# Patient Record
Sex: Female | Born: 1985 | Race: White | Hispanic: No | Marital: Married | Smoking: Former smoker
Health system: Southern US, Community
[De-identification: ages and names within clinical notes are randomized; demographics above are authoritative.]

## PROBLEM LIST (undated history)

## (undated) DIAGNOSIS — I10 Essential (primary) hypertension: Secondary | ICD-10-CM

## (undated) DIAGNOSIS — E559 Vitamin D deficiency, unspecified: Secondary | ICD-10-CM

## (undated) DIAGNOSIS — E66813 Obesity, class 3: Secondary | ICD-10-CM

## (undated) DIAGNOSIS — F319 Bipolar disorder, unspecified: Secondary | ICD-10-CM

## (undated) DIAGNOSIS — F451 Undifferentiated somatoform disorder: Secondary | ICD-10-CM

## (undated) DIAGNOSIS — M62830 Muscle spasm of back: Secondary | ICD-10-CM

## (undated) DIAGNOSIS — E119 Type 2 diabetes mellitus without complications: Secondary | ICD-10-CM

## (undated) DIAGNOSIS — F419 Anxiety disorder, unspecified: Secondary | ICD-10-CM

## (undated) DIAGNOSIS — E7800 Pure hypercholesterolemia, unspecified: Secondary | ICD-10-CM

## (undated) DIAGNOSIS — E781 Pure hyperglyceridemia: Secondary | ICD-10-CM

## (undated) DIAGNOSIS — F6089 Other specific personality disorders: Secondary | ICD-10-CM

## (undated) DIAGNOSIS — F603 Borderline personality disorder: Secondary | ICD-10-CM

## (undated) DIAGNOSIS — R000 Tachycardia, unspecified: Secondary | ICD-10-CM

## (undated) HISTORY — PX: APPENDECTOMY: SHX54

## (undated) HISTORY — PX: HAND SURGERY: SHX662

## (undated) HISTORY — PX: CHOLECYSTECTOMY: SHX55

## (undated) HISTORY — PX: SHOULDER SURGERY: SHX246

---

## 2007-12-02 ENCOUNTER — Encounter: Admission: RE | Admit: 2007-12-02 | Discharge: 2007-12-02 | Payer: Self-pay | Admitting: Unknown Physician Specialty

## 2008-10-16 ENCOUNTER — Emergency Department (HOSPITAL_COMMUNITY): Admission: EM | Admit: 2008-10-16 | Discharge: 2008-10-16 | Payer: Self-pay | Admitting: Family Medicine

## 2009-03-20 ENCOUNTER — Ambulatory Visit: Payer: Self-pay | Admitting: Family Medicine

## 2009-03-20 DIAGNOSIS — S93409A Sprain of unspecified ligament of unspecified ankle, initial encounter: Secondary | ICD-10-CM | POA: Insufficient documentation

## 2009-03-20 DIAGNOSIS — M25579 Pain in unspecified ankle and joints of unspecified foot: Secondary | ICD-10-CM

## 2009-03-20 DIAGNOSIS — J45909 Unspecified asthma, uncomplicated: Secondary | ICD-10-CM | POA: Insufficient documentation

## 2009-11-15 ENCOUNTER — Encounter: Admission: RE | Admit: 2009-11-15 | Discharge: 2009-11-15 | Payer: Self-pay | Admitting: Unknown Physician Specialty

## 2010-09-28 ENCOUNTER — Encounter: Payer: Self-pay | Admitting: Unknown Physician Specialty

## 2015-07-16 DIAGNOSIS — F312 Bipolar disorder, current episode manic severe with psychotic features: Secondary | ICD-10-CM | POA: Insufficient documentation

## 2015-07-17 DIAGNOSIS — F1220 Cannabis dependence, uncomplicated: Secondary | ICD-10-CM | POA: Insufficient documentation

## 2015-08-03 DIAGNOSIS — S62337A Displaced fracture of neck of fifth metacarpal bone, left hand, initial encounter for closed fracture: Secondary | ICD-10-CM | POA: Insufficient documentation

## 2017-01-27 ENCOUNTER — Ambulatory Visit (INDEPENDENT_AMBULATORY_CARE_PROVIDER_SITE_OTHER): Payer: Medicaid Other | Admitting: Licensed Clinical Social Worker

## 2017-01-27 DIAGNOSIS — F4321 Adjustment disorder with depressed mood: Secondary | ICD-10-CM

## 2017-01-27 DIAGNOSIS — F319 Bipolar disorder, unspecified: Secondary | ICD-10-CM | POA: Diagnosis not present

## 2017-01-27 DIAGNOSIS — F4320 Adjustment disorder, unspecified: Secondary | ICD-10-CM | POA: Diagnosis not present

## 2017-01-27 DIAGNOSIS — Z639 Problem related to primary support group, unspecified: Secondary | ICD-10-CM
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
## 2017-02-11 ENCOUNTER — Ambulatory Visit (HOSPITAL_COMMUNITY): Payer: Self-pay | Admitting: Licensed Clinical Social Worker

## 2017-03-02 ENCOUNTER — Ambulatory Visit (INDEPENDENT_AMBULATORY_CARE_PROVIDER_SITE_OTHER): Payer: Medicaid Other | Admitting: Licensed Clinical Social Worker

## 2017-03-02 DIAGNOSIS — F319 Bipolar disorder, unspecified: Secondary | ICD-10-CM

## 2017-03-02 DIAGNOSIS — F4320 Adjustment disorder, unspecified: Secondary | ICD-10-CM

## 2017-03-02 DIAGNOSIS — Z639 Problem related to primary support group, unspecified: Secondary | ICD-10-CM | POA: Diagnosis not present

## 2017-03-02 DIAGNOSIS — F4321 Adjustment disorder with depressed mood: Secondary | ICD-10-CM
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
## 2017-03-22 ENCOUNTER — Ambulatory Visit (HOSPITAL_COMMUNITY): Payer: Self-pay | Admitting: Licensed Clinical Social Worker

## 2017-04-08 ENCOUNTER — Ambulatory Visit (HOSPITAL_COMMUNITY): Payer: Medicaid Other | Admitting: Licensed Clinical Social Worker

## 2017-06-03 ENCOUNTER — Encounter: Payer: Self-pay | Admitting: *Deleted

## 2017-06-03 ENCOUNTER — Emergency Department
Admission: EM | Admit: 2017-06-03 | Discharge: 2017-06-03 | Disposition: A | Discharge status: Home / Self Care | Payer: Medicaid Other | Source: Home / Self Care | Attending: Family Medicine | Admitting: Family Medicine

## 2017-06-03 DIAGNOSIS — S0990XA Unspecified injury of head, initial encounter: Secondary | ICD-10-CM | POA: Diagnosis not present

## 2017-06-03 HISTORY — DX: Essential (primary) hypertension: I10

## 2017-06-03 HISTORY — DX: Bipolar disorder, unspecified: F31.9

## 2017-06-03 MED ORDER — IBUPROFEN 400 MG PO TABS
400.0000 mg | ORAL_TABLET | Freq: Once | ORAL | Status: AC
  Administered 2017-06-03: 400 mg via ORAL

## 2017-06-03 MED ORDER — IBUPROFEN 200 MG PO TABS: 200.0000 mg | ORAL_TABLET | Freq: Once | ORAL | Status: DC

## 2017-06-03 NOTE — ED Triage Notes (Signed)
Patient reports hitting her head on the corner of a doorway while reaching for something last night. She denies loss of consciousness. This AM awoke with confusion, HA on the right side and behind her right eye, and blurry vision. No otc meds taken.

## 2017-06-03 NOTE — ED Provider Notes (Signed)
[facility]    CSN: [id number] Arrival date & time: 06/03/17  1332     History   Chief Complaint Chief Complaint  Patient presents with  . Head Injury  . Headache    HPI Kara Stephens is a 31 y.o. female.   HPI  Kara Stephens is a 31 y.o. female presenting to UC with c/o mild Right front head pain that started last night after bending forward hitting her head on the corner of a door frame.  Denies LOC. She had minimal pain last night but states when she woke this morning she felt confused and had a mild headache behind her Right eye with blurred vision. Denies dizziness, nausea or vomiting.  No pain medication taken PTA.  She lives with her husband and daughters but states they did not comment on her acting strange this morning. Pt states she just felt bad.  She had a mild concussion several years ago after hitting her head on a desk at work.  No lasting effects from that. She is not on any blood thinners.     Past Medical History:  Diagnosis Date  . Bipolar I disorder (HCC)   . Hypertension     Patient Active Problem List   Diagnosis Date Noted  . ASTHMA 03/20/2009  . PAIN IN JOINT, ANKLE AND FOOT 03/20/2009  . SPRAIN/STRAIN, ANKLE NOS 03/20/2009    Past Surgical History:  Procedure Laterality Date  . APPENDECTOMY    . CHOLECYSTECTOMY    . SHOULDER SURGERY      OB History    No data available       Home Medications    Prior to Admission medications   Medication Sig Start Date End Date Taking? Authorizing Provider  clonazePAM (KLONOPIN) 1 MG tablet Take 1 mg by mouth 2 (two) times daily.   Yes [provider]  lithium carbonate (ESKALITH) 450 MG CR tablet Take 450 mg by mouth 2 (two) times daily.   Yes [provider]  traZODone (DESYREL) 150 MG tablet Take by mouth at bedtime.   Yes [provider]    Family History Family History  Problem Relation Age of Onset  . Hypertension Mother   . Hyperlipidemia Mother    . Suicidality Father   . Depression Father     Social History Social History  Substance Use Topics  . Smoking status: Current Every Day Smoker    Types: Cigarettes  . Smokeless tobacco: Never Used  . Alcohol use No     Allergies   Sulfur   Review of Systems Review of Systems  Eyes: Positive for visual disturbance. Negative for photophobia, pain and redness.  Gastrointestinal: Negative for nausea and vomiting.  Musculoskeletal: Negative for neck pain and neck stiffness.  Neurological: Positive for headaches. Negative for dizziness, seizures, facial asymmetry, weakness, light-headedness and numbness.  Psychiatric/Behavioral: Positive for confusion.     Physical Exam Triage Vital Signs ED Triage Vitals  Enc Vitals Group     BP 06/03/17 1422 123/84     Pulse Rate 06/03/17 1422 86     Resp 06/03/17 1422 14     Temp --      Temp src --      SpO2 06/03/17 1422 97 %     Weight 06/03/17 1423 220 lb (99.8 kg)     Height --      Head Circumference --      Peak Flow --      Pain  Score 06/03/17 1423 7     Pain Loc --      Pain Edu? --      Excl. in GC? --    No data found.   Updated Vital Signs BP 123/84 (BP Location: Left Arm)   Pulse 86   Resp 14   Wt 220 lb (99.8 kg)   LMP 05/06/2017   SpO2 97%   Visual Acuity- uncorrected  Right Eye Distance: 20/20 Left Eye Distance: 20/20 Bilateral Distance: 20/15   Physical Exam  Constitutional: She is oriented to person, place, and time. She appears well-developed and well-nourished. No distress.  HENT:  Head: Normocephalic and atraumatic.  Nose: Nose normal.  Mouth/Throat: Oropharynx is clear and moist.  Eyes: Pupils are equal, round, and reactive to light. Conjunctivae and EOM are normal. Right eye exhibits no discharge. Left eye exhibits no discharge.  Neck: Normal range of motion.  Cardiovascular: Normal rate and regular rhythm.   Pulmonary/Chest: Effort normal. No respiratory distress. She has no wheezes. She  has no rales.  Musculoskeletal: Normal range of motion.  Neurological: She is alert and oriented to person, place, and time. No cranial nerve deficit.  CN II-XII in tact. Speech is clear. Alert to person, place, time and event. Normal finger to nose coordination. Normal gait. Normal heel-to-toe gait.   Skin: Skin is warm and dry. She is not diaphoretic.  Psychiatric: She has a normal mood and affect. Her behavior is normal.  Nursing note and vitals reviewed.    UC Treatments / Results  Labs (all labs ordered are listed, but only abnormal results are displayed) Labs Reviewed - No data to display  EKG  EKG Interpretation None       Radiology No results found.  Procedures Procedures (including critical care time)  Medications Ordered in UC Medications  ibuprofen (ADVIL,MOTRIN) tablet 400 mg (400 mg Oral Given 06/03/17 1435)     Initial Impression / Assessment and Plan / UC Course  I have reviewed the triage vital signs and the nursing notes.  Pertinent labs & imaging results that were available during my care of the patient were reviewed by me and considered in my medical decision making (see chart for details).      Per [place] CT head injury criteria, no CT indicated at this time. Reassured pt of normal vision and neuro exam. No evidence of head trauma. Encouraged f/u with PCP next week if not improving Discussed symptoms that warrant emergent care in the ED.   Final Clinical Impressions(s) / UC Diagnoses   Final diagnoses:  Minor head injury, initial encounter    New Prescriptions Discharge Medication List as of 06/03/2017  2:40 PM       Controlled Substance Prescriptions [place] Controlled Substance Registry consulted? Not Applicable   Rolla Plate 06/03/17 1849

## 2017-06-05 ENCOUNTER — Telehealth: Payer: Self-pay | Admitting: Emergency Medicine

## 2017-06-05 NOTE — Telephone Encounter (Signed)
Patient states she is feeling a lot better; no known complications following head injury.

## 2017-12-21 ENCOUNTER — Emergency Department
Admission: EM | Admit: 2017-12-21 | Discharge: 2017-12-21 | Disposition: A | Discharge status: Home / Self Care | Payer: Medicaid Other | Source: Home / Self Care

## 2017-12-21 ENCOUNTER — Encounter: Payer: Self-pay | Admitting: Emergency Medicine

## 2017-12-21 ENCOUNTER — Other Ambulatory Visit: Payer: Self-pay

## 2017-12-21 DIAGNOSIS — J029 Acute pharyngitis, unspecified: Secondary | ICD-10-CM

## 2017-12-21 DIAGNOSIS — R05 Cough: Secondary | ICD-10-CM

## 2017-12-21 DIAGNOSIS — R059 Cough, unspecified: Secondary | ICD-10-CM

## 2017-12-21 LAB — POCT RAPID STREP A (OFFICE): Rapid Strep A Screen: NEGATIVE

## 2017-12-21 MED ORDER — FLUTICASONE PROPIONATE 50 MCG/ACT NA SUSP: 2.0000 | Freq: Every day | NASAL | 0 refills | Status: DC

## 2017-12-21 NOTE — ED Triage Notes (Signed)
Patient reports onset of sore throat over a week ago; has mild cough; today noticed white patch on uvula. No OTC today; no known fever.

## 2017-12-21 NOTE — ED Provider Notes (Signed)
[facility]    CSN: [id number] Arrival date & time: 12/21/17  1558     History   Chief Complaint Chief Complaint  Patient presents with  . Sore Throat  . Cough    HPI Kara Stephens is a 32 y.o. female.   HPI For the past week or so the patient has been having a sore throat.  She has a base voice in the mornings.  She has had some cough.  It is painful swallowing.  She does not smoke.  She lives in a household with 6 people, but she does not know of anyone else being sick.  She is the mother of 2 children.  She is not employed.  She has not been running a fever.  She has a history of high blood pressure, tachycardia, bipolar, and anxiety.  She is on a number of medications so is a little scared of over-the-counter meds and has not taken an antihistamine. Past Medical History:  Diagnosis Date  . Bipolar I disorder (HCC)   . Hypertension     Patient Active Problem List   Diagnosis Date Noted  . ASTHMA 03/20/2009  . PAIN IN JOINT, ANKLE AND FOOT 03/20/2009  . SPRAIN/STRAIN, ANKLE NOS 03/20/2009    Past Surgical History:  Procedure Laterality Date  . APPENDECTOMY    . CHOLECYSTECTOMY    . SHOULDER SURGERY      OB History   None      Home Medications    Prior to Admission medications   Medication Sig Start Date End Date Taking? Authorizing Provider  sertraline (ZOLOFT) 50 MG tablet Take 50 mg by mouth daily.   Yes [provider]  clonazePAM (KLONOPIN) 1 MG tablet Take 1 mg by mouth 2 (two) times daily.    [provider]  lithium carbonate (ESKALITH) 450 MG CR tablet Take 450 mg by mouth 2 (two) times daily.    [provider]  traZODone (DESYREL) 150 MG tablet Take by mouth at bedtime.    [provider]    Family History Family History  Problem Relation Age of Onset  . Hypertension Mother   . Hyperlipidemia Mother   . Suicidality Father   . Depression Father     Social History Social History    Tobacco Use  . Smoking status: Current Every Day Smoker    Types: Cigarettes  . Smokeless tobacco: Never Used  Substance Use Topics  . Alcohol use: No  . Drug use: No     Allergies   Abilify [aripiprazole] and Sulfur   Review of Systems Review of Systems Constitutional: Unremarkable Cardiovascular: Unremarkable Respiratory: Cough HEENT: Runny nose, sore throat.  She saw some white stuff on her uvula today. Esther intestinalTheda Sers  Physical Exam Triage Vital Signs ED Triage Vitals  Enc Vitals Group     BP 12/21/17 1627 128/83     Pulse Rate 12/21/17 1627 86     Resp 12/21/17 1627 18     Temp 12/21/17 1627 98.3 F (36.8 C)     Temp Source 12/21/17 1627 Oral     SpO2 12/21/17 1627 98 %     Weight 12/21/17 1628 235 lb (106.6 kg)     Height 12/21/17 1628 5\' 5"  (1.651 m)     Head Circumference --      Peak Flow --      Pain Score 12/21/17 1628 2     Pain Loc --      Pain  Edu? --      Excl. in GC? --    No data found.  Updated Vital Signs BP 128/83 (BP Location: Right Arm)   Pulse 86   Temp 98.3 F (36.8 C) (Oral)   Resp 18   Ht 5\' 5"  (1.651 m)   Wt 235 lb (106.6 kg)   LMP 12/02/2017 (Exact Date)   SpO2 98%   BMI 39.11 kg/m   Visual Acuity Right Eye Distance:   Left Eye Distance:   Bilateral Distance:    Right Eye Near:   Left Eye Near:    Bilateral Near:     Physical Exam No major acute distress.  Overweight young lady.  TMs normal.  Throat mildly erythematous.  No exudate seen right now.  Has a little bubbly whitish phlegm.  Neck is supple without significant nodes.  Chest is clear to auscultation.  Heart regular without murmur.  No rashes.  UC Treatments / Results  Labs (all labs ordered are listed, but only abnormal results are displayed) Labs Reviewed  POCT RAPID STREP A (OFFICE)    EKG None Radiology No results found.  Procedures Procedures (including critical care time)  Medications Ordered in UC Medications - No data to  display   Initial Impression / Assessment and Plan / UC Course  I have reviewed the triage vital signs and the nursing notes.  Pertinent labs & imaging results that were available during my care of the patient were reviewed by me and considered in my medical decision making (see chart for details).     Allergic pharyngitis and respiratory symptoms with cough  Final Clinical Impressions(s) / UC Diagnoses   Final diagnoses:  None    ED Discharge Orders    None     Take Allegra or Claritin plain for antihistamine.  Avoid decongestants  Continue your other medications  If your symptoms do not improve over the next several days of using the antihistamine for allergies, you can begin the Flonase (fluticasone) nose spray.  Controlled Substance Prescriptions [place] Controlled Substance Registry consulted? No   Peyton NajjarHopper, David H, MD 12/21/17 [phone]

## 2017-12-21 NOTE — Discharge Instructions (Addendum)
Take Allegra or Claritin plain for antihistamine.  Avoid decongestants  Continue your other medications  If your symptoms do not improve over the next several days of using the antihistamine for allergies, you can begin the Flonase (fluticasone) nose spray.

## 2018-08-12 ENCOUNTER — Other Ambulatory Visit: Payer: Self-pay

## 2018-08-12 ENCOUNTER — Emergency Department
Admission: EM | Admit: 2018-08-12 | Discharge: 2018-08-12 | Disposition: A | Discharge status: Home / Self Care | Payer: Medicaid Other | Source: Home / Self Care | Attending: Family Medicine | Admitting: Family Medicine

## 2018-08-12 DIAGNOSIS — J069 Acute upper respiratory infection, unspecified: Secondary | ICD-10-CM

## 2018-08-12 DIAGNOSIS — B9789 Other viral agents as the cause of diseases classified elsewhere: Secondary | ICD-10-CM | POA: Diagnosis not present

## 2018-08-12 MED ORDER — DOXYCYCLINE HYCLATE 100 MG PO CAPS: 100.0000 mg | ORAL_CAPSULE | Freq: Two times a day (BID) | ORAL | 0 refills | Status: DC

## 2018-08-12 MED ORDER — PREDNISONE 20 MG PO TABS: ORAL_TABLET | ORAL | 0 refills | Status: DC

## 2018-08-12 MED ORDER — GUAIFENESIN-CODEINE 100-10 MG/5ML PO SOLN: ORAL | 0 refills | Status: DC

## 2018-08-12 NOTE — ED Provider Notes (Signed)
[facility]    CSN: [id number] Arrival date & time: 08/12/18  1641     History   Chief Complaint Chief Complaint  Patient presents with  . Cough    HPI Kara Stephens is a 32 y.o. female.   About 10 days ago patient developed nasal congestion but did not feel ill.  About 5 days ago she developed a cough developing wheezing, shortness of breath, and bilateral pain in her chest.  She continues to have fever as high as 100.8.  She has a past history of pneumonia.  The history is provided by the patient.    Past Medical History:  Diagnosis Date  . Bipolar I disorder (HCC)   . Hypertension     Patient Active Problem List   Diagnosis Date Noted  . ASTHMA 03/20/2009  . PAIN IN JOINT, ANKLE AND FOOT 03/20/2009  . SPRAIN/STRAIN, ANKLE NOS 03/20/2009    Past Surgical History:  Procedure Laterality Date  . APPENDECTOMY    . CHOLECYSTECTOMY    . SHOULDER SURGERY      OB History   None      Home Medications    Prior to Admission medications   Medication Sig Start Date End Date Taking? Authorizing Provider  clonazePAM (KLONOPIN) 1 MG tablet Take 1 mg by mouth 2 (two) times daily.    [provider]  doxycycline (VIBRAMYCIN) 100 MG capsule Take 1 capsule (100 mg total) by mouth 2 (two) times daily. Take with food. 08/12/18   Lattie Haw, MD  fluticasone (FLONASE) 50 MCG/ACT nasal spray Place 2 sprays into both nostrils daily. 12/21/17   Peyton Najjar, MD  guaiFENesin-codeine 100-10 MG/5ML syrup Take 10mL by mouth at bedtime as needed for cough.  May repeat dose in 4 to 6 hours. 08/12/18   Lattie Haw, MD  lithium carbonate (ESKALITH) 450 MG CR tablet Take 450 mg by mouth 2 (two) times daily.    [provider]  predniSONE (DELTASONE) 20 MG tablet Take one tab by mouth twice daily for 5 days, then one daily. Take with food. 08/12/18   Lattie Haw, MD  sertraline (ZOLOFT) 50 MG tablet Take 50 mg by mouth daily.    [provider]  traZODone (DESYREL) 150 MG tablet Take by mouth at bedtime.    [provider]    Family History Family History  Problem Relation Age of Onset  . Hypertension Mother   . Hyperlipidemia Mother   . Suicidality Father   . Depression Father     Social History Social History   Tobacco Use  . Smoking status: Current Every Day Smoker    Types: Cigarettes  . Smokeless tobacco: Never Used  Substance Use Topics  . Alcohol use: No  . Drug use: No     Allergies   Abilify [aripiprazole] and Sulfur   Review of Systems Review of Systems ? sore throat + cough No pleuritic pain + wheezing + nasal congestion + post-nasal drainage ? sinus pain/pressure No itchy/red eyes No earache No hemoptysis No SOB + fever, + chills No nausea No vomiting No abdominal pain No diarrhea No urinary symptoms No skin rash + fatigue No myalgias + headache Used OTC meds without relief   Physical Exam Triage Vital Signs ED Triage Vitals [08/12/18 1736]  Enc Vitals Group     BP (!) 148/92     Pulse Rate 87     Resp      Temp  98.2 F (36.8 C)     Temp Source Oral     SpO2 97 %     Weight 241 lb (109.3 kg)     Height 5\' 5"  (1.651 m)     Head Circumference      Peak Flow      Pain Score 0     Pain Loc      Pain Edu?      Excl. in GC?    No data found.  Updated Vital Signs BP (!) 148/92 (BP Location: Right Arm)   Pulse 87   Temp 98.2 F (36.8 C) (Oral)   Ht 5\' 5"  (1.651 m)   Wt 109.3 kg   SpO2 97%   BMI 40.10 kg/m   Visual Acuity Right Eye Distance:   Left Eye Distance:   Bilateral Distance:    Right Eye Near:   Left Eye Near:    Bilateral Near:     Physical Exam Nursing notes and Vital Signs reviewed. Appearance:  Patient appears stated age, and in no acute distress Eyes:  Pupils are equal, round, and reactive to light and accomodation.  Extraocular movement is intact.  Conjunctivae are not inflamed  Ears:  Canals normal.  Tympanic  membranes normal.  Nose:  Mildly congested turbinates.  No sinus tenderness.  Pharynx:  Normal Neck:  Supple.  Enlarged posterior/lateral nodes are palpated bilaterally, tender to palpation on the left.   Lungs:  Clear to auscultation.  Breath sounds are equal.  Moving air well. Heart:  Regular rate and rhythm without murmurs, rubs, or gallops.  Abdomen:  Nontender without masses or hepatosplenomegaly.  Bowel sounds are present.  No CVA or flank tenderness.  Extremities:  No edema.  Skin:  No rash present.    UC Treatments / Results  Labs (all labs ordered are listed, but only abnormal results are displayed) Labs Reviewed - No data to display  EKG None  Radiology No results found.  Procedures Procedures (including critical care time)  Medications Ordered in UC Medications - No data to display  Initial Impression / Assessment and Plan / UC Course  I have reviewed the triage vital signs and the nursing notes.  Pertinent labs & imaging results that were available during my care of the patient were reviewed by me and considered in my medical decision making (see chart for details).    Suspect developing bronchitis.  Begin empiric doxycycline, and prednisone burst/taper. Rx for Robitussin AC for night time cough.  Controlled Substance Prescriptions I have consulted the [place] Controlled Substances Registry for this patient, and feel the risk/benefit ratio today is favorable for proceeding with this prescription for a controlled substance.   Followup with Family Doctor if not improved in about 10 days.   Final Clinical Impressions(s) / UC Diagnoses   Final diagnoses:  Viral URI with cough     Discharge Instructions     Take plain guaifenesin (1200mg  extended release tabs such as Mucinex) twice daily, with plenty of water, for cough and congestion.   Get adequate rest.   May use Afrin nasal spray (or generic oxymetazoline) each morning for about 5 days and then discontinue.   Also recommend using saline nasal spray several times daily and saline nasal irrigation (AYR is a common brand).  Use Flonase nasal spray each morning after using Afrin nasal spray and saline nasal irrigation. Try warm salt water gargles for sore throat.  Stop all antihistamines for now, and other non-prescription cough/cold preparations. May  take Tylenol as needed for fever, headache, etc.    ED Prescriptions    Medication Sig Dispense Auth. Provider   doxycycline (VIBRAMYCIN) 100 MG capsule Take 1 capsule (100 mg total) by mouth 2 (two) times daily. Take with food. 20 capsule Lattie Haw, MD   predniSONE (DELTASONE) 20 MG tablet Take one tab by mouth twice daily for 5 days, then one daily. Take with food. 14 tablet Lattie Haw, MD   guaiFENesin-codeine 100-10 MG/5ML syrup Take 10mL by mouth at bedtime as needed for cough.  May repeat dose in 4 to 6 hours. 100 mL Lattie Haw, MD         Lattie Haw, MD 08/13/18 [phone]

## 2018-08-12 NOTE — ED Triage Notes (Signed)
Pt c/o cough x 9 or so days. Started in sinuses. Now feels like chest. Coughing up brown phlegm. Hx of bronchitis. Taking ibuprofen prn.

## 2018-08-12 NOTE — Discharge Instructions (Addendum)
Take plain guaifenesin (1200mg extended release tabs such as Mucinex) twice daily, with plenty of water, for cough and congestion. Get adequate rest.   May use Afrin nasal spray (or generic oxymetazoline) each morning for about 5 days and then discontinue.  Also recommend using saline nasal spray several times daily and saline nasal irrigation (AYR is a common brand).  Use Flonase nasal spray each morning after using Afrin nasal spray and saline nasal irrigation. Try warm salt water gargles for sore throat.  Stop all antihistamines for now, and other non-prescription cough/cold preparations. May take Tylenol as needed for fever, headache, etc.    

## 2019-09-10 ENCOUNTER — Emergency Department
Admission: EM | Admit: 2019-09-10 | Discharge: 2019-09-10 | Discharge status: Against Medical Advice | Payer: Medicaid Other | Source: Home / Self Care

## 2019-09-10 ENCOUNTER — Other Ambulatory Visit: Payer: Self-pay

## 2019-09-11 ENCOUNTER — Other Ambulatory Visit: Payer: Self-pay

## 2019-09-11 ENCOUNTER — Emergency Department (INDEPENDENT_AMBULATORY_CARE_PROVIDER_SITE_OTHER)
Admission: EM | Admit: 2019-09-11 | Discharge: 2019-09-11 | Disposition: A | Discharge status: Home / Self Care | Payer: Medicaid Other | Source: Home / Self Care

## 2019-09-11 ENCOUNTER — Encounter: Payer: Self-pay | Admitting: *Deleted

## 2019-09-11 DIAGNOSIS — U071 COVID-19: Secondary | ICD-10-CM | POA: Diagnosis not present

## 2019-09-11 DIAGNOSIS — R509 Fever, unspecified: Secondary | ICD-10-CM | POA: Diagnosis not present

## 2019-09-11 MED ORDER — ONDANSETRON 8 MG PO TBDP: 8.0000 mg | ORAL_TABLET | Freq: Three times a day (TID) | ORAL | 0 refills | Status: DC | PRN

## 2019-09-11 MED ORDER — ALBUTEROL SULFATE HFA 108 (90 BASE) MCG/ACT IN AERS: 2.0000 | INHALATION_SPRAY | RESPIRATORY_TRACT | 3 refills | Status: AC | PRN

## 2019-09-11 NOTE — ED Provider Notes (Signed)
[facility]    CSN: [id number] Arrival date & time: 09/11/19  0943      History   Chief Complaint Chief Complaint  Patient presents with  . Headache  . Nasal Congestion    HPI Kara Stephens is a 34 y.o. female.   61 yo established patient  Pt c/o cough, sore throat, body aches, temp 99.7 and HA x 3 days.  Diarrhea.  Vomited this am.  Somewhat sob late in the day.     Past Medical History:  Diagnosis Date  . Bipolar I disorder ([place])   . Hypertension     Patient Active Problem List   Diagnosis Date Noted  . ASTHMA 03/20/2009  . PAIN IN JOINT, ANKLE AND FOOT 03/20/2009  . SPRAIN/STRAIN, ANKLE NOS 03/20/2009    Past Surgical History:  Procedure Laterality Date  . APPENDECTOMY    . CHOLECYSTECTOMY    . HAND SURGERY    . SHOULDER SURGERY      OB History   No obstetric history on file.      Home Medications    Prior to Admission medications   Medication Sig Start Date End Date Taking? Authorizing Provider  lamoTRIgine (LAMICTAL) 100 MG tablet Take 100 mg by mouth daily.   Yes [provider]  sertraline (ZOLOFT) 50 MG tablet Take 50 mg by mouth daily.   Yes [provider]  traZODone (DESYREL) 150 MG tablet Take by mouth at bedtime.   Yes [provider]  albuterol (VENTOLIN HFA) 108 (90 Base) MCG/ACT inhaler Inhale 2 puffs into the lungs every 4 (four) hours as needed for wheezing or shortness of breath (cough, shortness of breath or wheezing.). 09/11/19   Robyn Haber, MD  ondansetron (ZOFRAN-ODT) 8 MG disintegrating tablet Take 1 tablet (8 mg total) by mouth every 8 (eight) hours as needed for nausea. 09/11/19   Robyn Haber, MD    Family History Family History  Problem Relation Age of Onset  . Hypertension Mother   . Hyperlipidemia Mother   . Suicidality Father   . Depression Father     Social History Social History   Tobacco Use  . Smoking status: Former Smoker    Types: Cigarettes    Quit date:  09/10/2016    Years since quitting: 3.0  . Smokeless tobacco: Never Used  Substance Use Topics  . Alcohol use: No  . Drug use: No     Allergies   Other, Tramadol, Cortisone, Abilify [aripiprazole], Latuda [lurasidone], and Sulfur   Review of Systems Review of Systems  Constitutional: Positive for fever.  Respiratory: Positive for cough.   Gastrointestinal: Positive for diarrhea and vomiting.  All other systems reviewed and are negative.    Physical Exam Triage Vital Signs ED Triage Vitals  Enc Vitals Group     BP 09/11/19 1148 (!) 142/107     Pulse Rate 09/11/19 1148 98     Resp 09/11/19 1148 16     Temp 09/11/19 1148 98.5 F (36.9 C)     Temp Source 09/11/19 1148 Oral     SpO2 09/11/19 1148 98 %     Weight 09/11/19 1144 220 lb (99.8 kg)     Height 09/11/19 1144 5\' 5"  (1.651 m)     Head Circumference --      Peak Flow --      Pain Score 09/11/19 1144 0     Pain Loc --      Pain Edu? --  Excl. in GC? --    No data found.  Updated Vital Signs BP (!) 142/107 (BP Location: Right Arm)   Pulse 98   Temp 98.5 F (36.9 C) (Oral)   Resp 16   Ht 5\' 5"  (1.651 m)   Wt 99.8 kg   LMP 08/14/2019   SpO2 98%   BMI 36.61 kg/m    Physical Exam Vitals and nursing note reviewed.  Constitutional:      Appearance: She is well-developed.  HENT:     Mouth/Throat:     Mouth: Mucous membranes are moist.  Cardiovascular:     Rate and Rhythm: Normal rate.  Pulmonary:     Effort: Pulmonary effort is normal.  Musculoskeletal:     Cervical back: Normal range of motion and neck supple.  Skin:    General: Skin is warm.  Neurological:     Mental Status: She is alert.  Psychiatric:        Mood and Affect: Mood normal.        Speech: Speech normal.        Behavior: Behavior normal.      UC Treatments / Results  Labs (all labs ordered are listed, but only abnormal results are displayed) Labs Reviewed  NOVEL CORONAVIRUS, NAA    EKG   Radiology No results  found.  Procedures Procedures (including critical care time)  Medications Ordered in UC Medications - No data to display  Initial Impression / Assessment and Plan / UC Course  I have reviewed the triage vital signs and the nursing notes.  Pertinent labs & imaging results that were available during my care of the patient were reviewed by me and considered in my medical decision making (see chart for details).    Final Clinical Impressions(s) / UC Diagnoses   Final diagnoses:  Fever, unspecified  Clinical diagnosis of COVID-19     Discharge Instructions     Vitamin D3 5000 IU (125 mg) daily Vitamin C 500 mg twice daily Zinc 50 to 75 mg daily  Listerine type mouthwash 4 times a day  COVID-19 infusion hotline:  [phone]     ED Prescriptions    Medication Sig Dispense Auth. Provider   albuterol (VENTOLIN HFA) 108 (90 Base) MCG/ACT inhaler Inhale 2 puffs into the lungs every 4 (four) hours as needed for wheezing or shortness of breath (cough, shortness of breath or wheezing.). 6.7 g [phone], MD   ondansetron (ZOFRAN-ODT) 8 MG disintegrating tablet Take 1 tablet (8 mg total) by mouth every 8 (eight) hours as needed for nausea. 12 tablet Elvina Sidle, MD     PDMP not reviewed this encounter.   Elvina Sidle, MD 09/11/19 1214

## 2019-09-11 NOTE — ED Triage Notes (Signed)
Pt c/o cough, sore throat, body aches, temp 99.7 and HA x 3 days.

## 2019-09-11 NOTE — Discharge Instructions (Addendum)
Vitamin D3 5000 IU (125 mg) daily Vitamin C 500 mg twice daily Zinc 50 to 75 mg daily  Listerine type mouthwash 4 times a day  COVID-19 infusion hotline:  [phone]  

## 2019-09-13 ENCOUNTER — Telehealth: Payer: Self-pay

## 2019-09-13 LAB — NOVEL CORONAVIRUS, NAA: SARS-CoV-2, NAA: DETECTED — AB

## 2019-09-13 NOTE — Telephone Encounter (Signed)
Pt called for covid lab results for herself and two children. Informed lab results are pos. Advised to isolate for 10 days and head to hospital if respiratory sxs worsen.

## 2019-09-15 ENCOUNTER — Emergency Department
Admission: EM | Admit: 2019-09-15 | Discharge: 2019-09-15 | Disposition: A | Discharge status: Home / Self Care | Payer: Medicaid Other | Source: Home / Self Care

## 2019-09-15 ENCOUNTER — Emergency Department (INDEPENDENT_AMBULATORY_CARE_PROVIDER_SITE_OTHER): Payer: Medicaid Other

## 2019-09-15 ENCOUNTER — Other Ambulatory Visit: Payer: Self-pay

## 2019-09-15 DIAGNOSIS — U071 COVID-19: Secondary | ICD-10-CM

## 2019-09-15 DIAGNOSIS — I10 Essential (primary) hypertension: Secondary | ICD-10-CM

## 2019-09-15 NOTE — Discharge Instructions (Signed)
See your Physicain for recheck of blood pressure. Tylenol for fever and bodyaches

## 2019-09-15 NOTE — ED Provider Notes (Signed)
[facility]    CSN: [id number] Arrival date & time: 09/15/19  1154      History   Chief Complaint Chief Complaint  Patient presents with  . COVID pos  . Shortness of Breath  . Cough    HPI Kara Stephens is a 34 y.o. female.   The history is provided by the patient. No language interpreter was used.  Shortness of Breath Severity:  Moderate Onset quality:  Gradual Timing:  Constant Progression:  Worsening Relieved by:  Nothing Worsened by:  Nothing Associated symptoms: cough   Cough Associated symptoms: shortness of breath   Pt reports she has been sick since 1/3.  Pt tested positive for covid after being seen on 1/4.  Pt concerned that she has pneumonia Pt complains of pain middle and both sides of her chest Past Medical History:  Diagnosis Date  . Bipolar I disorder (HCC)   . Hypertension     Patient Active Problem List   Diagnosis Date Noted  . ASTHMA 03/20/2009  . PAIN IN JOINT, ANKLE AND FOOT 03/20/2009  . SPRAIN/STRAIN, ANKLE NOS 03/20/2009    Past Surgical History:  Procedure Laterality Date  . APPENDECTOMY    . CHOLECYSTECTOMY    . HAND SURGERY    . SHOULDER SURGERY      OB History   No obstetric history on file.      Home Medications    Prior to Admission medications   Medication Sig Start Date End Date Taking? Authorizing Provider  albuterol (VENTOLIN HFA) 108 (90 Base) MCG/ACT inhaler Inhale 2 puffs into the lungs every 4 (four) hours as needed for wheezing or shortness of breath (cough, shortness of breath or wheezing.). 09/11/19   Elvina Sidle, MD  lamoTRIgine (LAMICTAL) 100 MG tablet Take 100 mg by mouth daily.    [provider]  ondansetron (ZOFRAN-ODT) 8 MG disintegrating tablet Take 1 tablet (8 mg total) by mouth every 8 (eight) hours as needed for nausea. 09/11/19   Elvina Sidle, MD  sertraline (ZOLOFT) 50 MG tablet Take 50 mg by mouth daily.    [provider]  traZODone (DESYREL) 150 MG tablet  Take by mouth at bedtime.    [provider]    Family History Family History  Problem Relation Age of Onset  . Hypertension Mother   . Hyperlipidemia Mother   . Suicidality Father   . Depression Father     Social History Social History   Tobacco Use  . Smoking status: Former Smoker    Types: Cigarettes    Quit date: 09/10/2016    Years since quitting: 3.0  . Smokeless tobacco: Never Used  Substance Use Topics  . Alcohol use: No  . Drug use: No     Allergies   Other, Tramadol, Cortisone, Abilify [aripiprazole], Latuda [lurasidone], and Sulfur   Review of Systems Review of Systems  Respiratory: Positive for cough and shortness of breath.   All other systems reviewed and are negative.    Physical Exam Triage Vital Signs ED Triage Vitals  Enc Vitals Group     BP 09/15/19 1229 (!) 162/95     Pulse Rate 09/15/19 1229 (!) 106     Resp 09/15/19 1229 16     Temp 09/15/19 1229 98.4 F (36.9 C)     Temp Source 09/15/19 1229 Oral     SpO2 09/15/19 1229 95 %     Weight 09/15/19 1230 218 lb 4.1 oz (99 kg)  Height 09/15/19 1230 5\' 5"  (1.651 m)     Head Circumference --      Peak Flow --      Pain Score 09/15/19 1230 0     Pain Loc --      Pain Edu? --      Excl. in [place]? --    No data found.  Updated Vital Signs BP (!) 162/95 (BP Location: Right Arm)   Pulse (!) 106   Temp 98.4 F (36.9 C) (Oral)   Resp 16   Ht 5\' 5"  (1.651 m)   Wt 99 kg   SpO2 95%   BMI 36.32 kg/m   Visual Acuity Right Eye Distance:   Left Eye Distance:   Bilateral Distance:    Right Eye Near:   Left Eye Near:    Bilateral Near:     Physical Exam Vitals and nursing note reviewed.  Constitutional:      Appearance: She is well-developed.  HENT:     Head: Normocephalic.  Cardiovascular:     Rate and Rhythm: Normal rate and regular rhythm.  Pulmonary:     Effort: Pulmonary effort is normal.     Breath sounds: Normal breath sounds.  Chest:     Chest wall: No  deformity.  Abdominal:     General: There is no distension.  Musculoskeletal:        General: Normal range of motion.  Skin:    General: Skin is warm.  Neurological:     Mental Status: She is alert and oriented to person, place, and time.      UC Treatments / Results  Labs (all labs ordered are listed, but only abnormal results are displayed) Labs Reviewed - No data to display  EKG   Radiology CXR PA/Lat  Result Date: 09/15/2019 CLINICAL DATA:  COVID positive.  Fever, cough, shortness of breath. EXAM: CHEST - 2 VIEW COMPARISON:  12/02/2007. FINDINGS: Mediastinum and hilar structures normal. Lungs are clear. No pleural effusion or pneumothorax. Surgical clips right upper quadrant. IMPRESSION: No acute cardiopulmonary disease. Electronically Signed   By: Marcello Moores  Register   On: 09/15/2019 13:11    Procedures Procedures (including critical care time)  Medications Ordered in UC Medications - No data to display  Initial Impression / Assessment and Plan / UC Course  I have reviewed the triage vital signs and the nursing notes.  Pertinent labs & imaging results that were available during my care of the patient were reviewed by me and considered in my medical decision making (see chart for details).     MDM  Radiologist reports normal chest xray.  Pt has 02 sat of 95.  Blood pressure is elevated.  Pt advised t have her primary recheck her blood pressure.  Final Clinical Impressions(s) / UC Diagnoses   Final diagnoses:  [id number]  Essential hypertension     Discharge Instructions     See your Physicain for recheck of blood pressure. Tylenol for fever and bodyaches   ED Prescriptions    None     PDMP not reviewed this encounter.  An After Visit Summary was printed and given to the patient.    Fransico Meadow, [place] 09/15/19 1348

## 2019-09-15 NOTE — ED Triage Notes (Signed)
Seen in UC 1/4. Covid pos 1/6. Back today for possible CXR to r/o pnuemonia. Says she is having SOB and coughing up yellow phlegm. Continues to run low grade fever at home. Dayquil and inhaler prn.

## 2019-09-17 ENCOUNTER — Telehealth: Payer: Self-pay | Admitting: Emergency Medicine

## 2019-09-17 NOTE — Telephone Encounter (Signed)
Patient called stating that she has tested positive for COVID 19 on 09/12/19.  Patient is running a 103.5 temp and having SOB.  After speaking with Jerrilyn Cairo who reviewed patients chart and seen patient had a negative CXR 2 days ago.  Patient was advised to go to the ED for further evaluation for possible fluids or a CT scan.  Patient voices understanding.

## 2019-09-21 DIAGNOSIS — U071 COVID-19: Secondary | ICD-10-CM | POA: Diagnosis present

## 2019-09-21 DIAGNOSIS — J1282 Pneumonia due to coronavirus disease 2019: Secondary | ICD-10-CM | POA: Insufficient documentation

## 2019-09-21 DIAGNOSIS — J9690 Respiratory failure, unspecified, unspecified whether with hypoxia or hypercapnia: Secondary | ICD-10-CM | POA: Insufficient documentation

## 2019-10-19 ENCOUNTER — Encounter: Payer: Self-pay | Admitting: Emergency Medicine

## 2019-10-19 ENCOUNTER — Emergency Department (INDEPENDENT_AMBULATORY_CARE_PROVIDER_SITE_OTHER)
Admission: EM | Admit: 2019-10-19 | Discharge: 2019-10-19 | Disposition: A | Discharge status: Other Acute Inpatient Hospital | Payer: Medicaid Other | Source: Home / Self Care

## 2019-10-19 ENCOUNTER — Other Ambulatory Visit: Payer: Self-pay

## 2019-10-19 DIAGNOSIS — R600 Localized edema: Secondary | ICD-10-CM

## 2019-10-19 DIAGNOSIS — F3112 Bipolar disorder, current episode manic without psychotic features, moderate: Secondary | ICD-10-CM

## 2019-10-19 DIAGNOSIS — T50905A Adverse effect of unspecified drugs, medicaments and biological substances, initial encounter: Secondary | ICD-10-CM

## 2019-10-19 DIAGNOSIS — F5105 Insomnia due to other mental disorder: Secondary | ICD-10-CM

## 2019-10-19 DIAGNOSIS — F99 Mental disorder, not otherwise specified: Secondary | ICD-10-CM

## 2019-10-19 HISTORY — DX: Muscle spasm of back: M62.830

## 2019-10-19 HISTORY — DX: Tachycardia, unspecified: R00.0

## 2019-10-19 HISTORY — DX: Anxiety disorder, unspecified: F41.9

## 2019-10-19 NOTE — Discharge Instructions (Addendum)
Please return back to [facility] health at [facility] medical center. You need to be evaluated for possible Lithium toxicity and your other medications may need to be adjusted.

## 2019-10-19 NOTE — ED Triage Notes (Signed)
Patient here with concerns about edema in legs, and now in arms and belly developing over past 72 hours. She was released from [facility] inpatient 7 days ago and is on numerous medications; current follow up visit scheduled for 11/13/19 but she feels this is too distant. Has had influenza vacc this season. No known contact covid positive person.

## 2019-10-19 NOTE — ED Triage Notes (Signed)
She does not have a PCP.

## 2019-10-19 NOTE — ED Provider Notes (Addendum)
[facility]    CSN: [id number] Arrival date & time: 10/19/19  1223      History   Chief Complaint Chief Complaint  Patient presents with  . Leg Swelling    HPI Kara Stephens is a 34 y.o. female.   HPI  Patient presents for evaluation of 3 days of gradually worsening bilateral lower leg edema, mania, and insomnia. Medical history significant for Bipolar 1 with mania. She was discharged 1 week from 10 days impatient admission to behavioral health at [facility]. She is present here at urgent care today with her daughter and mother in law. Her daughter and mother in law endorses patient's behavior has been manic at times and after she takes her medication develop intervals of somnolence temporarily followed by periods of high energy. Patient endorses feeling completely rested after 20-30 minutes of sleep and then has a burst of energy and is awake continuously for several hours.  She endorses feeling a "little off or out of it". Family endorses patient is awake all night and most of the day. This has remained an issue since her discharge from [facility] 1 week ago. She noted approximately 3 days ago, both of her lower legs began to swell. She has sustained a weight gain of greater 20 lb since 10/03/2019 documented weight documented at [facility].  Patient is on multiple psychiatric medications along with other medications that induce drowsiness and sedation.  Family also reports that patient has previously been prescribed lithium however her dose was increased at this last admission due to level being nontherapeutic.  She has not had a repeat lithium level since she was discharged from the hospital.  She has had a visit this week with her psychiatrist however those notes are not available either on care anywhere and at this time been unable to retrieve her discharge information from her recent [facility] admission.  Patient denies any symptoms of shortness of  breath, chest pain, or leg pain.  She does endorse leg tightness due to swelling.  She does endorse palpitation and she is slightly tachycardic on arrival.  She is afebrile.  Patient has already been diagnosed with Covid early January and recovered from any symptoms. Past Medical History:  Diagnosis Date  . Anxiety   . Back spasm   . Bipolar I disorder (HCC)   . Hypertension   . Tachycardia     Patient Active Problem List   Diagnosis Date Noted  . ASTHMA 03/20/2009  . PAIN IN JOINT, ANKLE AND FOOT 03/20/2009  . SPRAIN/STRAIN, ANKLE NOS 03/20/2009    Past Surgical History:  Procedure Laterality Date  . APPENDECTOMY    . CHOLECYSTECTOMY    . HAND SURGERY    . SHOULDER SURGERY      OB History   No obstetric history on file.      Home Medications    Prior to Admission medications   Medication Sig Start Date End Date Taking? Authorizing Provider  carvedilol (COREG) 12.5 MG tablet Take 12.5 mg by mouth 2 (two) times daily with a meal.   Yes [provider]  cyclobenzaprine (FLEXERIL) 5 MG tablet Take 5 mg by mouth 3 (three) times daily as needed for muscle spasms.   Yes [provider]  lithium 300 MG tablet Take 300 mg by mouth 2 (two) times daily.   Yes [provider]  LORazepam (ATIVAN) 1 MG tablet Take 1 mg by mouth every 8 (eight) hours.  Yes [provider]  metoprolol succinate (TOPROL-XL) 50 MG 24 hr tablet Take 50 mg by mouth daily. Take with or immediately following a meal.   Yes [provider]  albuterol (VENTOLIN HFA) 108 (90 Base) MCG/ACT inhaler Inhale 2 puffs into the lungs every 4 (four) hours as needed for wheezing or shortness of breath (cough, shortness of breath or wheezing.). 09/11/19   Elvina Sidle, MD  lamoTRIgine (LAMICTAL) 100 MG tablet Take 100 mg by mouth daily.    [provider]  ondansetron (ZOFRAN-ODT) 8 MG disintegrating tablet Take 1 tablet (8 mg total) by mouth every 8 (eight) hours as  needed for nausea. 09/11/19   Elvina Sidle, MD  sertraline (ZOLOFT) 50 MG tablet Take 50 mg by mouth daily.    [provider]  traZODone (DESYREL) 150 MG tablet Take by mouth at bedtime.    [provider]    Family History Family History  Problem Relation Age of Onset  . Hypertension Mother   . Hyperlipidemia Mother   . Suicidality Father   . Depression Father     Social History Social History   Tobacco Use  . Smoking status: Former Smoker    Types: Cigarettes    Quit date: 09/10/2016    Years since quitting: 3.1  . Smokeless tobacco: Never Used  Substance Use Topics  . Alcohol use: No  . Drug use: No     Allergies   Other, Tramadol, Cortisone, Abilify [aripiprazole], Latuda [lurasidone], and Sulfur   Review of Systems Review of Systems Pertinent negatives listed in HPI  Physical Exam Triage Vital Signs ED Triage Vitals  Enc Vitals Group     BP 10/19/19 1246 130/82     Pulse Rate 10/19/19 1246 (!) 106     Resp 10/19/19 1246 18     Temp 10/19/19 1246 97.9 F (36.6 C)     Temp Source 10/19/19 1246 Oral     SpO2 10/19/19 1246 98 %     Weight 10/19/19 1247 232 lb (105.2 kg)     Height 10/19/19 1247 5\' 5"  (1.651 m)     Head Circumference --      Peak Flow --      Pain Score 10/19/19 1247 0     Pain Loc --      Pain Edu? --      Excl. in GC? --    No data found.  Updated Vital Signs BP 130/82 (BP Location: Right Arm)   Pulse (!) 106   Temp 97.9 F (36.6 C) (Oral)   Resp 18   Ht 5\' 5"  (1.651 m)   Wt 232 lb (105.2 kg) Comment: states recent weight in hospital was 212 lb  SpO2 98%   BMI 38.61 kg/m   Visual Acuity Right Eye Distance:   Left Eye Distance:   Bilateral Distance:    Right Eye Near:   Left Eye Near:    Bilateral Near:     Physical Exam Constitutional:      Appearance: She is obese.     Comments: Appears drowsy, although responding to questions appropriately   Eyes:     Pupils: Pupils are equal, round, and  reactive to light.     Right eye: Pupil is sluggish. Pupil is round and reactive.     Left eye: Pupil is sluggish. Pupil is round and reactive.  Cardiovascular:     Rate and Rhythm: Regular rhythm. Tachycardia present.  Pulmonary:     Breath sounds:  Decreased breath sounds present.     Comments: Generalized diminished lung sounds  Abdominal:     General: There is distension.     Palpations: Abdomen is soft.     Tenderness: There is no abdominal tenderness.  Musculoskeletal:     Right lower leg: 4+ Pitting Edema present.     Left lower leg: 3+ Pitting Edema present.  Skin:    Capillary Refill: Capillary refill takes less than 2 seconds.     Findings: No bruising or erythema.  Neurological:     Mental Status: She is oriented to person, place, and time.  Psychiatric:        Attention and Perception: She is inattentive.        Mood and Affect: Affect is flat.        Speech: Speech is delayed.        Judgment: Judgment is not impulsive.    UC Treatments / Results  Labs (all labs ordered are listed, but only abnormal results are displayed) Labs Reviewed - No data to display  EKG   Radiology No results found.  Procedures Procedures (including critical care time)  Medications Ordered in UC Medications - No data to display  Initial Impression / Assessment and Plan / UC Course  I have reviewed the triage vital signs and the nursing notes.  Pertinent labs & imaging results that were available during my care of the patient were reviewed by me and considered in my medical decision making (see chart for details).     Leg edema Adverse effect of drug, initial encounter Insomnia due to other mental disorder Moderate bipolar I disorder with mania as current episode ([place]) -Finally was able to retrieve medication list from [facility] and apparently patient is taking medications that were discontinued per the discharge list from recent admission.  Patient's  home medication list and discharge medication lists from behavioral health are not reconciled appropriately.  Patient is very sedated at presentation here at the urgent care in addition she has had a 20 pound weight gain since her last weight check which was on 10/03/19.  Given acute weight gain, sedate of state, and patient's complex psychiatric history, discussed at length with husband via telephone the patient's most appropriate place of care in order to regulate her medications given her accompanying mental health symptoms is to take her back to [facility] behavioral health in [place] for evaluation and management.  Husband verbalized understanding patient discharged to her mother-in-law, for automobile transport to [facility] ER.  Patient is stable, ambulatory, and without distress. Final Clinical Impressions(s) / UC Diagnoses   Final diagnoses:  Leg edema  Adverse effect of drug, initial encounter  Insomnia due to other mental disorder  Moderate bipolar I disorder with mania as current episode [facility])     Discharge Instructions     Please return back to [place] at [facility] medical center. You need to be evaluated for possible Lithium toxicity and your other medications may need to be adjusted.     ED Prescriptions    None     PDMP not reviewed this encounter.   Scot Jun, FNP 10/19/19 1801    Scot Jun, FNP 10/20/19 1228

## 2019-12-07 DIAGNOSIS — E669 Obesity, unspecified: Secondary | ICD-10-CM | POA: Insufficient documentation

## 2019-12-07 DIAGNOSIS — R7303 Prediabetes: Secondary | ICD-10-CM | POA: Insufficient documentation

## 2020-07-31 ENCOUNTER — Emergency Department
Admission: EM | Admit: 2020-07-31 | Discharge: 2020-07-31 | Disposition: A | Discharge status: Home / Self Care | Payer: Medicaid Other | Source: Home / Self Care | Attending: Family Medicine | Admitting: Family Medicine

## 2020-07-31 ENCOUNTER — Other Ambulatory Visit: Payer: Self-pay

## 2020-07-31 DIAGNOSIS — J069 Acute upper respiratory infection, unspecified: Secondary | ICD-10-CM | POA: Diagnosis not present

## 2020-07-31 DIAGNOSIS — J9801 Acute bronchospasm: Secondary | ICD-10-CM

## 2020-07-31 MED ORDER — METHYLPREDNISOLONE ACETATE 80 MG/ML IJ SUSP
80.0000 mg | Freq: Once | INTRAMUSCULAR | Status: AC
  Administered 2020-07-31: 80 mg via INTRAMUSCULAR

## 2020-07-31 MED ORDER — DOXYCYCLINE HYCLATE 100 MG PO CAPS: ORAL_CAPSULE | ORAL | 0 refills | Status: DC

## 2020-07-31 NOTE — Discharge Instructions (Addendum)
Take plain guaifenesin (1200mg extended release tabs such as Mucinex) twice daily, with plenty of water, for cough and congestion.   Get adequate rest.   May use Afrin nasal spray (or generic oxymetazoline) each morning for about 5 days and then discontinue.  Also recommend using saline nasal spray several times daily and saline nasal irrigation (AYR is a common brand).  Use Flonase nasal spray each morning after using Afrin nasal spray and saline nasal irrigation. Try warm salt water gargles for sore throat.  Stop all antihistamines (Nyquil, etc) for now, and other non-prescription cough/cold preparations. May take Delsym Cough Suppressant ("12 Hour Cough Relief") at bedtime for nighttime cough.    

## 2020-07-31 NOTE — ED Provider Notes (Signed)
[facility]    CSN: [id number] Arrival date & time: 07/31/20  1344      History   Chief Complaint Chief Complaint  Patient presents with  . Fever  . Nasal Congestion  . Headache    HPI DEJANAY WAMBOLDT is a 34 y.o. female.   Patient reports that she had a mild "asthma attack" four days ago.  The next day she developed sore throat, fever to 100.9 with chills, fatigue, productive cough with wheezing, and sensation of fullness in her ears.   She had COVID infection in January 2021, and a past history of pneumonia.  The history is provided by the patient.    Past Medical History:  Diagnosis Date  . Anxiety   . Back spasm   . Bipolar I disorder (HCC)   . Hypertension   . Tachycardia     Patient Active Problem List   Diagnosis Date Noted  . ASTHMA 03/20/2009  . PAIN IN JOINT, ANKLE AND FOOT 03/20/2009  . SPRAIN/STRAIN, ANKLE NOS 03/20/2009    Past Surgical History:  Procedure Laterality Date  . APPENDECTOMY    . CHOLECYSTECTOMY    . HAND SURGERY    . SHOULDER SURGERY      OB History   No obstetric history on file.      Home Medications    Prior to Admission medications   Medication Sig Start Date End Date Taking? Authorizing Provider  amLODipine (NORVASC) 5 MG tablet Take by mouth. 04/08/20  Yes [provider]  clonazePAM (KLONOPIN) 1 MG tablet TAKE 1 TABLET BY MOUTH TWICE A DAY AS NEEDED FOR ANXIETY 11/24/19  Yes [provider]  QUEtiapine (SEROQUEL) 25 MG tablet Take 25 mg by mouth at bedtime.   Yes [provider]  albuterol (VENTOLIN HFA) 108 (90 Base) MCG/ACT inhaler Inhale 2 puffs into the lungs every 4 (four) hours as needed for wheezing or shortness of breath (cough, shortness of breath or wheezing.). 09/11/19   Elvina Sidle, MD  carvedilol (COREG) 12.5 MG tablet Take 12.5 mg by mouth 2 (two) times daily with a meal.    [provider]  cyclobenzaprine (FLEXERIL) 5 MG tablet Take 5 mg by mouth 3  (three) times daily as needed for muscle spasms.    [provider]  doxycycline (VIBRAMYCIN) 100 MG capsule Take one cap PO Q12hr with food. 07/31/20   Lattie Haw, MD  lamoTRIgine (LAMICTAL) 100 MG tablet Take 125 mg by mouth daily.     [provider]  lithium 300 MG tablet Take 300 mg by mouth 2 (two) times daily.    [provider]  LORazepam (ATIVAN) 1 MG tablet Take 1 mg by mouth every 8 (eight) hours.    [provider]  metoprolol succinate (TOPROL-XL) 50 MG 24 hr tablet Take 50 mg by mouth daily. Take with or immediately following a meal.    [provider]  ondansetron (ZOFRAN-ODT) 8 MG disintegrating tablet Take 1 tablet (8 mg total) by mouth every 8 (eight) hours as needed for nausea. 09/11/19   Elvina Sidle, MD  sertraline (ZOLOFT) 50 MG tablet Take 50 mg by mouth daily.    [provider]  traZODone (DESYREL) 150 MG tablet Take 100 mg by mouth at bedtime as needed.     [provider]    Family History Family History  Problem Relation Age of Onset  . Hypertension Mother   . Hyperlipidemia Mother   . Suicidality Father   .  Depression Father     Social History Social History   Tobacco Use  . Smoking status: Former Smoker    Types: Cigarettes    Quit date: 09/10/2016    Years since quitting: 3.8  . Smokeless tobacco: Never Used  Vaping Use  . Vaping Use: Never used  Substance Use Topics  . Alcohol use: No  . Drug use: No     Allergies   Other, Tramadol, Cortisone, Abilify [aripiprazole], Latuda [lurasidone], and Sulfur   Review of Systems Review of Systems  + sore throat + cough No pleuritic pain + wheezing + nasal congestion + post-nasal drainage + sinus pain/pressure No itchy/red eyes ? earache No hemoptysis No SOB + fever, + chills No nausea No vomiting No abdominal pain No diarrhea No urinary symptoms No skin rash + fatigue No myalgias No headache Used OTC meds  (Coricidin and Nyquil) without relief    Physical Exam Triage Vital Signs ED Triage Vitals  Enc Vitals Group     BP 07/31/20 1359 122/77     Pulse Rate 07/31/20 1359 83     Resp 07/31/20 1359 19     Temp 07/31/20 1359 98.5 F (36.9 C)     Temp Source 07/31/20 1359 Oral     SpO2 07/31/20 1359 97 %     Weight --      Height --      Head Circumference --      Peak Flow --      Pain Score 07/31/20 1400 0     Pain Loc --      Pain Edu? --      Excl. in GC? --    No data found.  Updated Vital Signs BP 122/77 (BP Location: Left Arm)   Pulse 83   Temp 98.5 F (36.9 C) (Oral)   Resp 19   SpO2 97%   Visual Acuity Right Eye Distance:   Left Eye Distance:   Bilateral Distance:    Right Eye Near:   Left Eye Near:    Bilateral Near:     Physical Exam Nursing notes and Vital Signs reviewed. Appearance:  Patient appears stated age, and in no acute distress Eyes:  Pupils are equal, round, and reactive to light and accomodation.  Extraocular movement is intact.  Conjunctivae are not inflamed  Ears:  Canals normal.  Tympanic membranes normal.  Nose:  Congested turbinates.   Maxillary sinus tenderness is present.  Pharynx:  Normal Neck:  Supple.  Mildly enlarged lateral nodes are present, tender to palpation on the left.   Lungs:  Clear to auscultation.  Breath sounds are equal.  Moving air well. Heart:  Regular rate and rhythm without murmurs, rubs, or gallops.  Abdomen:  Nontender without masses or hepatosplenomegaly.  Bowel sounds are present.  No CVA or flank tenderness.  Extremities:  No edema.  Skin:  No rash present.   UC Treatments / Results  Labs (all labs ordered are listed, but only abnormal results are displayed) Labs Reviewed - No data to display  EKG   Radiology No results found.  Procedures Procedures (including critical care time)  Medications Ordered in UC Medications  methylPREDNISolone acetate (DEPO-MEDROL) injection 80 mg (has no administration  in time range)    Initial Impression / Assessment and Plan / UC Course  I have reviewed the triage vital signs and the nursing notes.  Pertinent labs & imaging results that were available during my care of the patient were reviewed by  me and considered in my medical decision making (see chart for details).    Because of her past history of pneumonia, will begin empiric doxycycline. Administered Depo Medrol 80mg  IM.  Followup with Family Doctor if not improved in about 10 days.   Final Clinical Impressions(s) / UC Diagnoses   Final diagnoses:  Viral URI with cough  Bronchospasm, acute     Discharge Instructions     Take plain guaifenesin (1200mg  extended release tabs such as Mucinex) twice daily, with plenty of water, for cough and congestion.  Get adequate rest.   May use Afrin nasal spray (or generic oxymetazoline) each morning for about 5 days and then discontinue.  Also recommend using saline nasal spray several times daily and saline nasal irrigation (AYR is a common brand).  Use Flonase nasal spray each morning after using Afrin nasal spray and saline nasal irrigation. Try warm salt water gargles for sore throat.  Stop all antihistamines (Nyquil, etc) for now, and other non-prescription cough/cold preparations. May take Delsym Cough Suppressant ("12 Hour Cough Relief") at bedtime for nighttime cough.     ED Prescriptions    Medication Sig Dispense Auth. Provider   doxycycline (VIBRAMYCIN) 100 MG capsule Take one cap PO Q12hr with food. 20 capsule , MD        , MD 07/31/20 [phone]

## 2020-07-31 NOTE — ED Triage Notes (Addendum)
Pt c/o nasal congestion and sinus pressure since Sunday. Semi productive cough, some brownish yellow mucous. Also c/o fever and some labored breathing. 100.9 last night. Hx of asthma. Taking nyquil and daytime cold and flu prn. No known covid exposure. Hx of pnuemonia. COVID in Jan 2021.

## 2020-08-26 DIAGNOSIS — M65332 Trigger finger, left middle finger: Secondary | ICD-10-CM | POA: Insufficient documentation

## 2020-09-02 DIAGNOSIS — M67442 Ganglion, left hand: Secondary | ICD-10-CM | POA: Insufficient documentation

## 2021-06-25 IMAGING — DX DG CHEST 2V
2 series · 2 of 2 positions shown · non-contrast
Comparison: 12/02/2007.

CLINICAL DATA: COVID positive.  Fever, cough, shortness of breath.

EXAM:
CHEST - 2 VIEW

[chest pa]
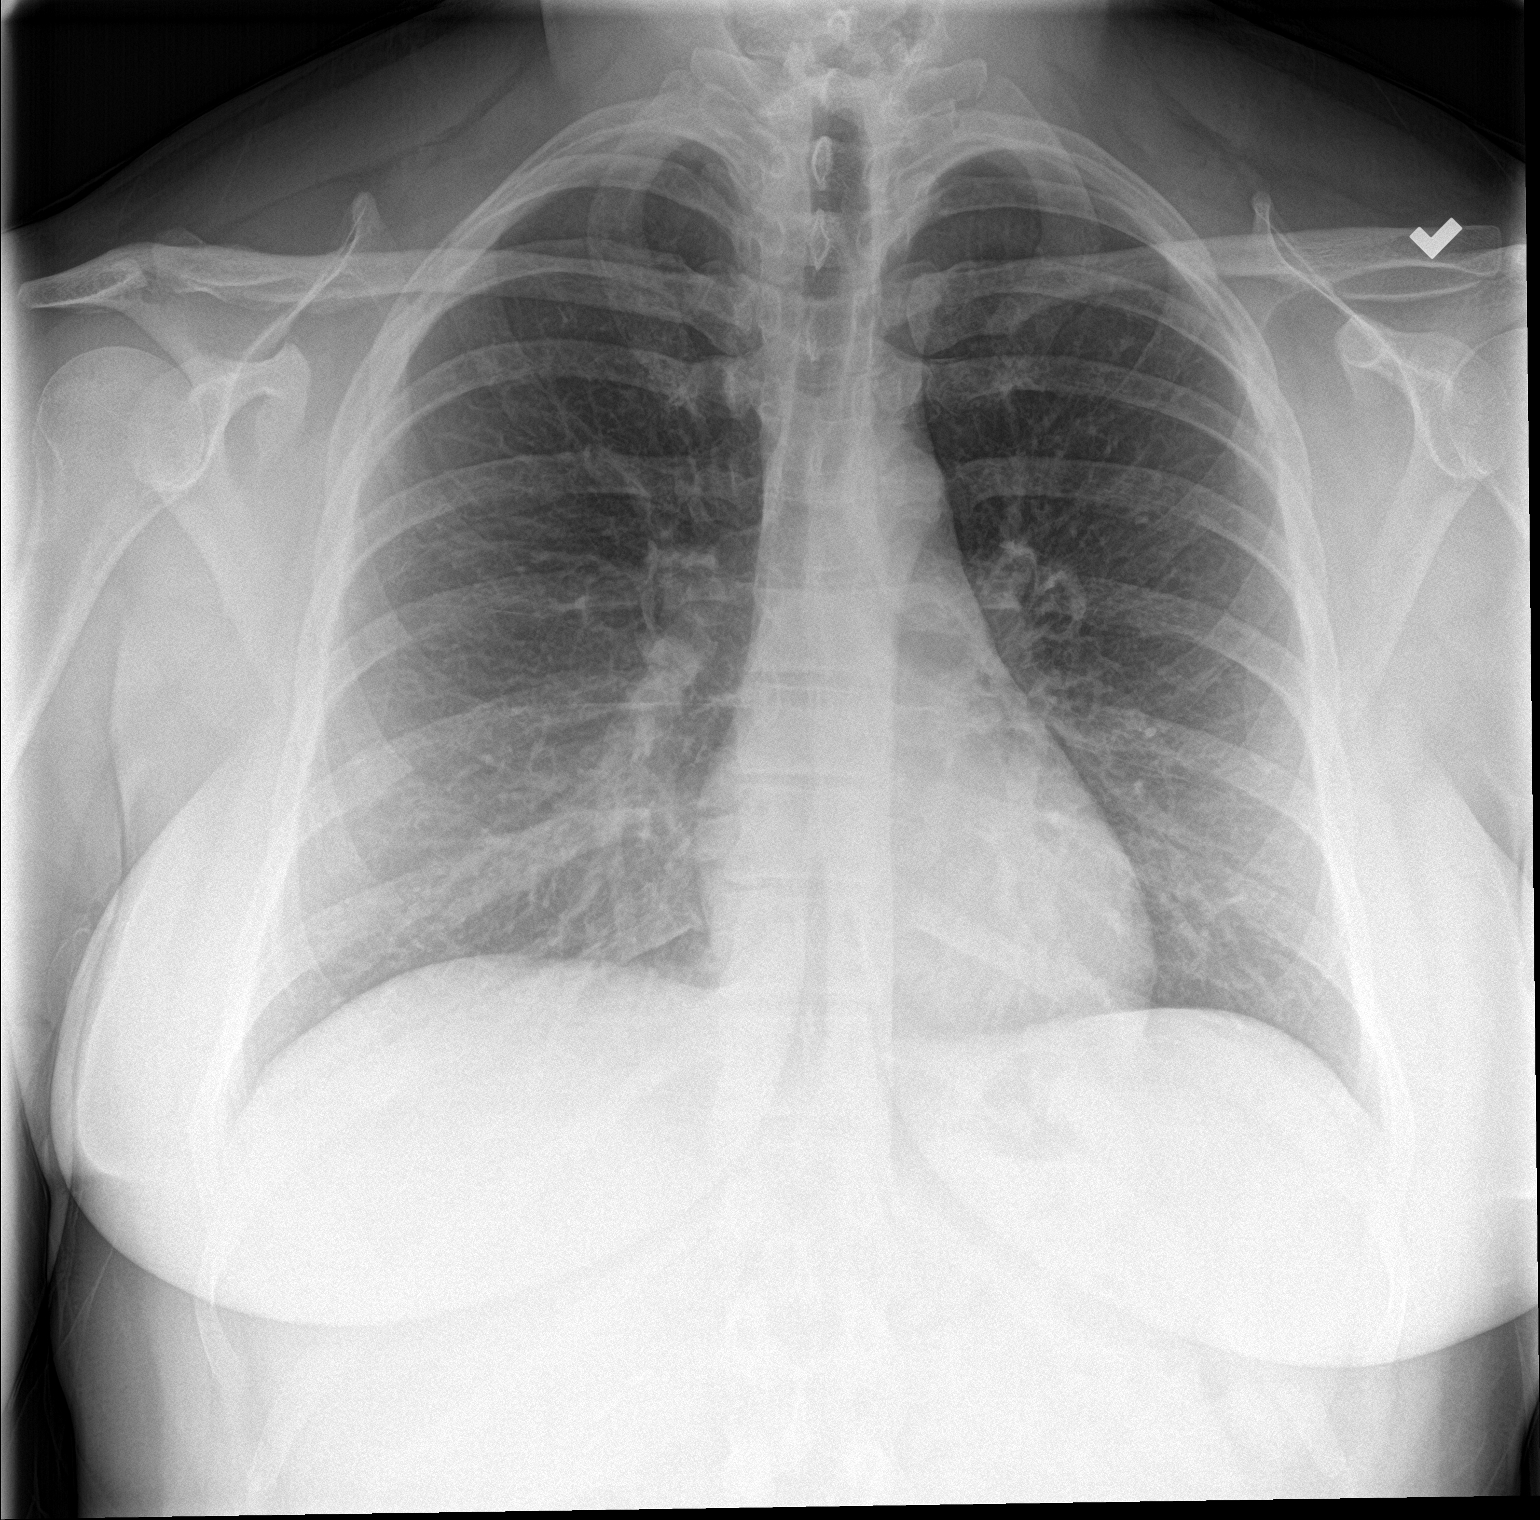

[chest lat]
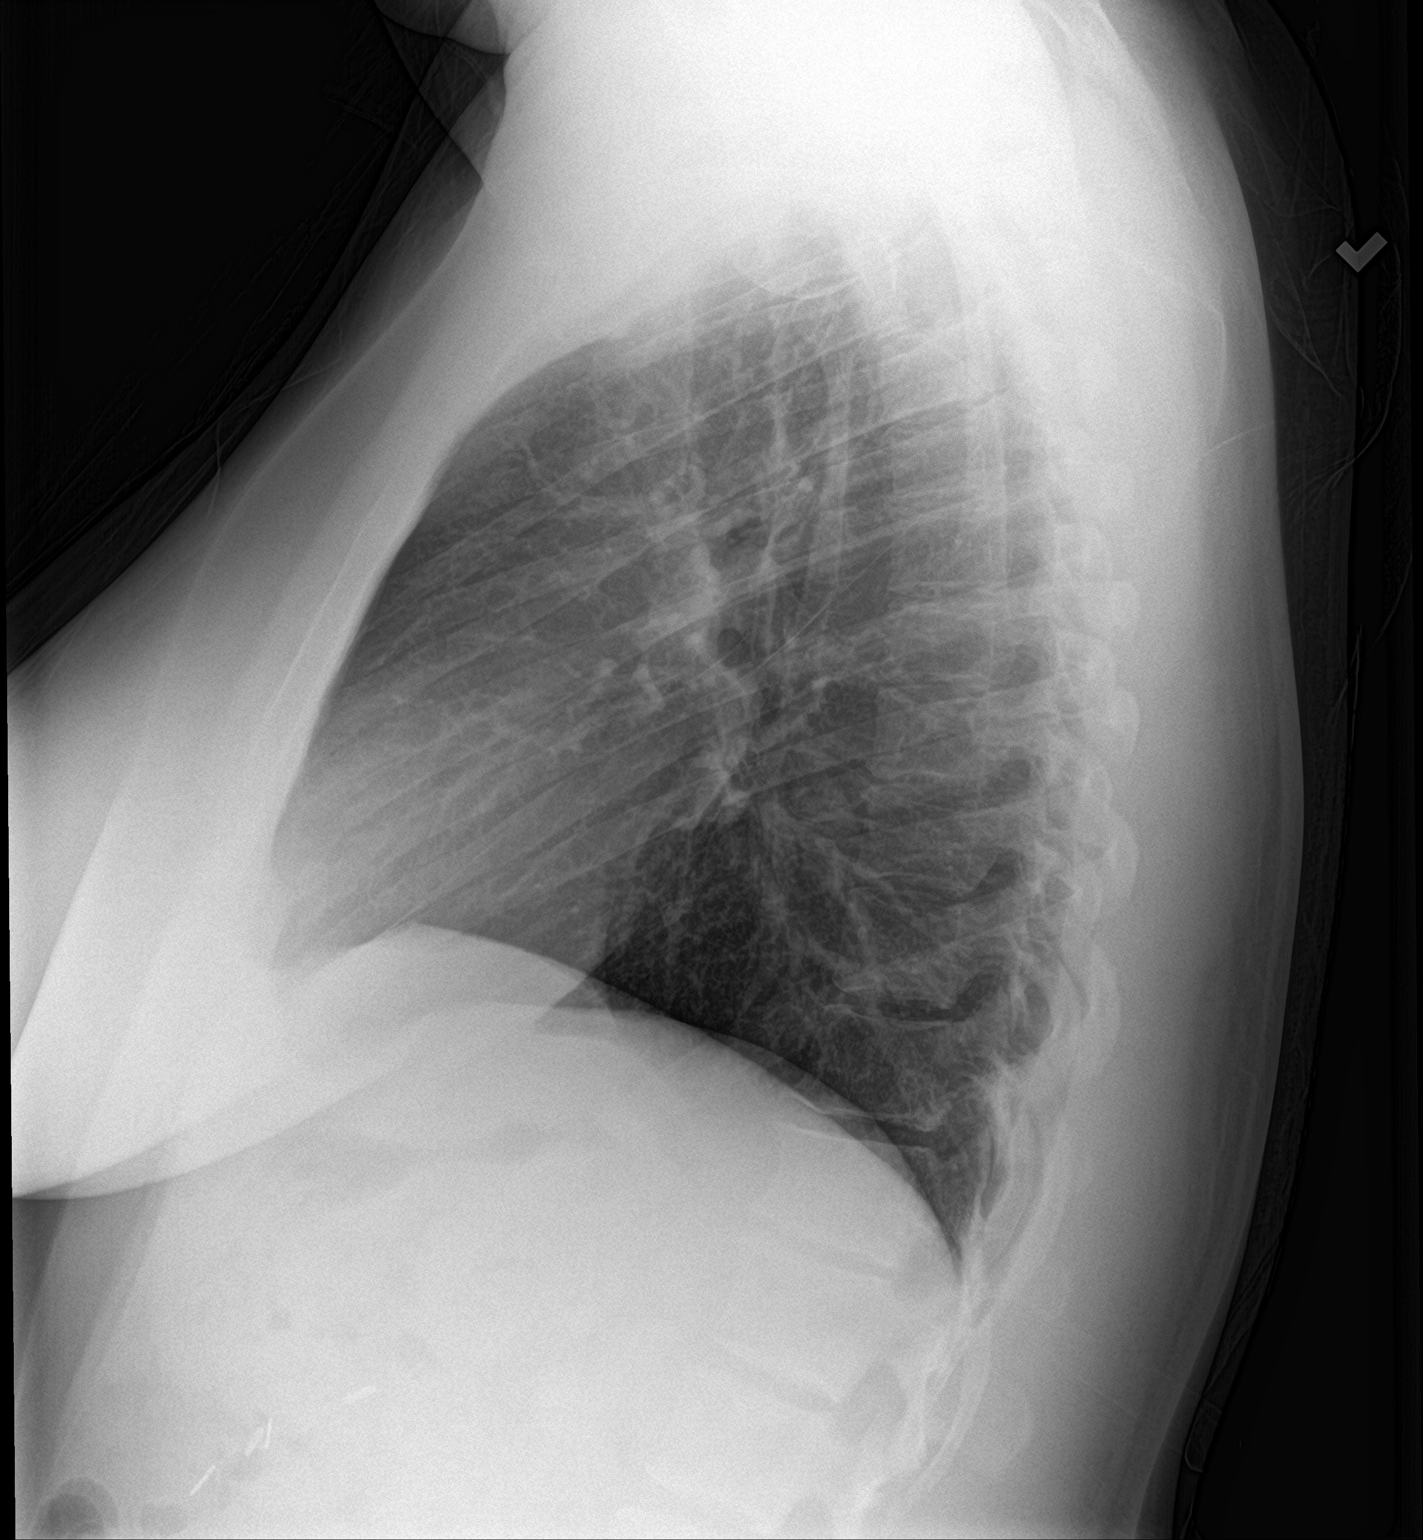

[2 of 2 positions shown; findings below may reference images not displayed]

FINDINGS: Mediastinum and hilar structures normal. Lungs are clear. No pleural
effusion or pneumothorax. Surgical clips right upper quadrant.
IMPRESSION: No acute cardiopulmonary disease.

## 2021-10-29 DIAGNOSIS — M5431 Sciatica, right side: Secondary | ICD-10-CM | POA: Insufficient documentation

## 2022-01-30 DIAGNOSIS — G5600 Carpal tunnel syndrome, unspecified upper limb: Secondary | ICD-10-CM | POA: Insufficient documentation

## 2022-06-01 DIAGNOSIS — L0591 Pilonidal cyst without abscess: Secondary | ICD-10-CM | POA: Insufficient documentation

## 2022-08-10 ENCOUNTER — Ambulatory Visit (INDEPENDENT_AMBULATORY_CARE_PROVIDER_SITE_OTHER)
Admission: EM | Admit: 2022-08-10 | Discharge: 2022-08-11 | Disposition: A | Discharge status: Other Acute Inpatient Hospital | Payer: Medicaid Other | Source: Home / Self Care

## 2022-08-10 ENCOUNTER — Ambulatory Visit (HOSPITAL_COMMUNITY)
Admission: EM | Admit: 2022-08-10 | Discharge: 2022-08-10 | Disposition: A | Discharge status: Home / Self Care | Payer: Medicaid Other | Attending: Psychiatry | Admitting: Psychiatry

## 2022-08-10 ENCOUNTER — Other Ambulatory Visit: Payer: Self-pay

## 2022-08-10 DIAGNOSIS — Z1152 Encounter for screening for COVID-19: Secondary | ICD-10-CM | POA: Diagnosis not present

## 2022-08-10 DIAGNOSIS — F319 Bipolar disorder, unspecified: Secondary | ICD-10-CM | POA: Insufficient documentation

## 2022-08-10 DIAGNOSIS — Z79899 Other long term (current) drug therapy: Secondary | ICD-10-CM | POA: Insufficient documentation

## 2022-08-10 DIAGNOSIS — F53 Postpartum depression: Secondary | ICD-10-CM

## 2022-08-10 DIAGNOSIS — Z3202 Encounter for pregnancy test, result negative: Secondary | ICD-10-CM | POA: Diagnosis not present

## 2022-08-10 DIAGNOSIS — F419 Anxiety disorder, unspecified: Secondary | ICD-10-CM | POA: Insufficient documentation

## 2022-08-10 DIAGNOSIS — R45851 Suicidal ideations: Secondary | ICD-10-CM | POA: Insufficient documentation

## 2022-08-10 DIAGNOSIS — F531 Puerperal psychosis: Secondary | ICD-10-CM

## 2022-08-10 LAB — POCT URINE DRUG SCREEN - MANUAL ENTRY (I-SCREEN)
POC Amphetamine UR: NOT DETECTED
POC Amphetamine UR: NOT DETECTED
POC Buprenorphine (BUP): NOT DETECTED
POC Buprenorphine (BUP): NOT DETECTED
POC Cocaine UR: NOT DETECTED
POC Cocaine UR: NOT DETECTED
POC Marijuana UR: NOT DETECTED
POC Marijuana UR: NOT DETECTED
POC Methadone UR: NOT DETECTED
POC Methadone UR: NOT DETECTED
POC Methamphetamine UR: NOT DETECTED
POC Methamphetamine UR: NOT DETECTED
POC Morphine: NOT DETECTED
POC Morphine: NOT DETECTED
POC Oxazepam (BZO): POSITIVE — AB
POC Oxazepam (BZO): POSITIVE — AB
POC Oxycodone UR: NOT DETECTED
POC Oxycodone UR: NOT DETECTED
POC Secobarbital (BAR): NOT DETECTED
POC Secobarbital (BAR): NOT DETECTED

## 2022-08-10 LAB — CBC WITH DIFFERENTIAL/PLATELET
Abs Immature Granulocytes: 0.02 10*3/uL (ref 0.00–0.07)
Basophils Absolute: 0 10*3/uL (ref 0.0–0.1)
Basophils Relative: 1 %
Eosinophils Absolute: 0.1 10*3/uL (ref 0.0–0.5)
Eosinophils Relative: 1 %
HCT: 39.6 % (ref 36.0–46.0)
Hemoglobin: 13.7 g/dL (ref 12.0–15.0)
Immature Granulocytes: 0 %
Lymphocytes Relative: 36 %
Lymphs Abs: 2.9 10*3/uL (ref 0.7–4.0)
MCH: 29 pg (ref 26.0–34.0)
MCHC: 34.6 g/dL (ref 30.0–36.0)
MCV: 83.7 fL (ref 80.0–100.0)
Monocytes Absolute: 0.5 10*3/uL (ref 0.1–1.0)
Monocytes Relative: 6 %
Neutro Abs: 4.6 10*3/uL (ref 1.7–7.7)
Neutrophils Relative %: 56 %
Platelets: 295 10*3/uL (ref 150–400)
RBC: 4.73 MIL/uL (ref 3.87–5.11)
RDW: 14.6 % (ref 11.5–15.5)
WBC: 8.1 10*3/uL (ref 4.0–10.5)
nRBC: 0 % (ref 0.0–0.2)

## 2022-08-10 LAB — POCT PREGNANCY, URINE: Preg Test, Ur: NEGATIVE

## 2022-08-10 LAB — POC URINE PREG, ED: Preg Test, Ur: NEGATIVE

## 2022-08-10 LAB — POC SARS CORONAVIRUS 2 AG: SARSCOV2ONAVIRUS 2 AG: NEGATIVE

## 2022-08-10 MED ORDER — QUETIAPINE FUMARATE 25 MG PO TABS
25.0000 mg | ORAL_TABLET | Freq: Every day | ORAL | Status: DC
  Administered 2022-08-10: 25 mg via ORAL
  Filled 2022-08-10: qty 1

## 2022-08-10 MED ORDER — ACETAMINOPHEN 325 MG PO TABS: 650.0000 mg | ORAL_TABLET | Freq: Four times a day (QID) | ORAL | Status: DC | PRN

## 2022-08-10 MED ORDER — TRAZODONE HCL 100 MG PO TABS
100.0000 mg | ORAL_TABLET | Freq: Every evening | ORAL | Status: DC | PRN
  Administered 2022-08-10: 100 mg via ORAL
  Filled 2022-08-10: qty 1

## 2022-08-10 MED ORDER — ACETAMINOPHEN 325 MG PO TABS
650.0000 mg | ORAL_TABLET | Freq: Four times a day (QID) | ORAL | Status: DC | PRN
  Administered 2022-08-11: 650 mg via ORAL
  Filled 2022-08-10: qty 2

## 2022-08-10 MED ORDER — QUETIAPINE FUMARATE 25 MG PO TABS: 25.0000 mg | ORAL_TABLET | Freq: Every day | ORAL | Status: DC

## 2022-08-10 MED ORDER — TRAZODONE HCL 100 MG PO TABS: 100.0000 mg | ORAL_TABLET | Freq: Every evening | ORAL | Status: DC | PRN

## 2022-08-10 MED ORDER — MAGNESIUM HYDROXIDE 400 MG/5ML PO SUSP: 30.0000 mL | Freq: Every day | ORAL | Status: DC | PRN

## 2022-08-10 MED ORDER — ALUM & MAG HYDROXIDE-SIMETH 200-200-20 MG/5ML PO SUSP: 30.0000 mL | ORAL | Status: DC | PRN

## 2022-08-10 MED ORDER — CARVEDILOL 12.5 MG PO TABS: 12.5000 mg | ORAL_TABLET | Freq: Two times a day (BID) | ORAL | Status: DC

## 2022-08-10 MED ORDER — AMLODIPINE BESYLATE 5 MG PO TABS
5.0000 mg | ORAL_TABLET | Freq: Every day | ORAL | Status: DC
  Filled 2022-08-10: qty 1

## 2022-08-10 MED ORDER — ONDANSETRON 4 MG PO TBDP: 8.0000 mg | ORAL_TABLET | Freq: Three times a day (TID) | ORAL | Status: DC | PRN

## 2022-08-10 MED ORDER — AMLODIPINE BESYLATE 5 MG PO TABS: 5.0000 mg | ORAL_TABLET | Freq: Every day | ORAL | Status: DC

## 2022-08-10 MED ORDER — LAMOTRIGINE 25 MG PO TABS: 125.0000 mg | ORAL_TABLET | Freq: Every day | ORAL | Status: DC

## 2022-08-10 MED ORDER — CARVEDILOL 12.5 MG PO TABS
12.5000 mg | ORAL_TABLET | Freq: Two times a day (BID) | ORAL | Status: DC
  Administered 2022-08-11: 12.5 mg via ORAL
  Filled 2022-08-10: qty 1

## 2022-08-10 MED ORDER — LAMOTRIGINE 25 MG PO TABS
125.0000 mg | ORAL_TABLET | Freq: Every day | ORAL | Status: DC
  Filled 2022-08-10: qty 1

## 2022-08-10 NOTE — ED Provider Notes (Signed)
[facility] Urgent Care Continuous Assessment Admission H&P  Date: 08/10/22 Patient Name: Kara Stephens MRN: [id number] Chief Complaint:  Chief Complaint  Patient presents with   Suicidal      Diagnoses:  Final diagnoses:  Suicidal ideation  Bipolar I disorder (HCC)  Postpartum depression    HPI: HPI: Kara Stephens,  36y. Female with a history of bipolar disorder, presented to [facility] voluntarily accompanied by her husband.  According to patient she is suicidal with plans to jump off her balcony.  When asked if there is any stressors patient stated she just feels depressed, patient recently had a newborn.  Per the paient she has been depressed for the past few weeks and it keep getting worst.  Patient is followed by psychiatry at [facility] psychiatric associate.  Patient is not currently seeing a therapist patient is prescribed the following medications Lamictal, trazodone, Zyprexa, Ativan.  According to patient she lives at home with her husband to older kids and a young baby.  Patient was last hospitalized at [facility] and also [facility] for mania.   Face-to-face observation of patient, patient is alert and oriented x 4, speech is clear, maintaining eye contact.  Upon entering the room patient is observed laying on the chair patient is very depressed, very withdrawn.  Mood is depressed affect is flat congruent with mood.  Patient endorsed suicidal ideation with plans to jump off of the balcony at her apartment building.  Patient denies HI, AVH or paranoia.  Patient denies alcohol use, denies smoking, denies illicit drug use.   Recommend inpatient observation and further assessment.  PHQ 2-9:  Flowsheet Row ED from 08/10/2022 in [facility]  Thoughts that you would be better off dead, or of hurting yourself in some way More than half the days  PHQ-9 Total Score 20       Flowsheet Row ED from 08/10/2022 in [facility] Most  recent reading at 08/10/2022 10:10 PM ED from 08/10/2022 in [facility] Most recent reading at 08/10/2022  7:46 PM  C-SSRS RISK CATEGORY High Risk Error: Q7 should not be populated when Q6 is No        Total Time spent with patient: 20 minutes  Musculoskeletal  Strength & Muscle Tone: within normal limits Gait & Station: normal Patient leans: N/A  Psychiatric Specialty Exam  Presentation General Appearance:  Casual  Eye Contact: Fair  Speech: Clear and Coherent  Speech Volume: Normal  Handedness: Right   Mood and Affect  Mood: Anxious; Depressed; Hopeless; Worthless  Affect: Depressed   Thought Process  Thought Processes: Linear  Descriptions of Associations:Circumstantial  Orientation:Full (Time, Place and Person)  Thought Content:WDL    Hallucinations:Hallucinations: None  Ideas of Reference:None  Suicidal Thoughts:Suicidal Thoughts: Yes, Active SI Active Intent and/or Plan: With Plan  Homicidal Thoughts:Homicidal Thoughts: No   Sensorium  Memory: Immediate Fair  Judgment: Fair  Insight: Fair   Chartered certified accountant: Fair  Attention Span: Fair  Recall: [employer] of Knowledge: Fair  Language: Fair   Psychomotor Activity  Psychomotor Activity: Psychomotor Activity: Normal   Assets  Assets: Desire for Improvement; Resilience   Sleep  Sleep: Sleep: Fair Number of Hours of Sleep: 5   Nutritional Assessment (For OBS and FBC admissions only) Has the patient had a weight loss or gain of 10 pounds or more in the last 3 months?: No Has the patient had a decrease in food intake/or appetite?: No  Does the patient have dental problems?: No Does the patient have eating habits or behaviors that may be indicators of an eating disorder including binging or inducing vomiting?: No Has the patient recently lost weight without trying?: 0 Has the patient been eating poorly because of a  decreased appetite?: 0 Malnutrition Screening Tool Score: 0    Physical Exam HENT:     Head: Normocephalic.     Nose: Nose normal.  Cardiovascular:     Rate and Rhythm: Normal rate.  Pulmonary:     Effort: Pulmonary effort is normal.  Musculoskeletal:        General: Normal range of motion.     Cervical back: Normal range of motion.  Neurological:     General: No focal deficit present.     Mental Status: She is alert.  Psychiatric:        Mood and Affect: Mood normal.        Behavior: Behavior normal.        Thought Content: Thought content normal.        Judgment: Judgment normal.    Review of Systems  Constitutional: Negative.   HENT: Negative.    Eyes: Negative.   Respiratory: Negative.    Cardiovascular: Negative.   Gastrointestinal: Negative.   Genitourinary: Negative.   Musculoskeletal: Negative.   Skin: Negative.   Neurological: Negative.   Endo/Heme/Allergies: Negative.   Psychiatric/Behavioral:  Positive for depression and suicidal ideas. The patient is nervous/anxious.     Blood pressure (!) 133/94, pulse 92, temperature 98.4 F (36.9 C), temperature source Oral, resp. rate 18, SpO2 99 %. There is no height or weight on file to calculate BMI.  Past Psychiatric History: bipolar 1   Is the patient at risk to self? Yes  Has the patient been a risk to self in the past 6 months? Yes .    Has the patient been a risk to self within the distant past? Yes   Is the patient a risk to others? Yes   Has the patient been a risk to others in the past 6 months? No   Has the patient been a risk to others within the distant past? No   Past Medical History:  Past Medical History:  Diagnosis Date   Anxiety    Back spasm    Bipolar I disorder (HCC)    Hypertension    Tachycardia     Past Surgical History:  Procedure Laterality Date   APPENDECTOMY     CHOLECYSTECTOMY     HAND SURGERY     SHOULDER SURGERY      Family History:  Family History  Problem  Relation Age of Onset   Hypertension Mother    Hyperlipidemia Mother    Suicidality Father    Depression Father     Social History:  Social History   Socioeconomic History   Marital status: Married    Spouse name: Not on file   Number of children: Not on file   Years of education: Not on file   Highest education level: Not on file  Occupational History   Not on file  Tobacco Use   Smoking status: Former    Types: Cigarettes    Quit date: 09/10/2016    Years since quitting: 5.9   Smokeless tobacco: Never  Vaping Use   Vaping Use: Never used  Substance and Sexual Activity   Alcohol use: No   Drug use: No   Sexual activity: Not on file  Other  Topics Concern   Not on file  Social History Narrative   Not on file   Social Determinants of Health   Financial Resource Strain: Not on file  Food Insecurity: Not on file  Transportation Needs: Not on file  Physical Activity: Not on file  Stress: Not on file  Social Connections: Not on file  Intimate Partner Violence: Not on file    SDOH:  SDOH Screenings   Depression (PHQ2-9): High Risk (08/10/2022)  Tobacco Use: Medium Risk (07/31/2020)    Last Labs:  Admission on 08/10/2022, Discharged on 08/10/2022  Component Date Value Ref Range Status   POC Amphetamine UR 08/10/2022 None Detected  NONE DETECTED (Cut Off Level 1000 ng/mL) Final   POC Secobarbital (BAR) 08/10/2022 None Detected  NONE DETECTED (Cut Off Level 300 ng/mL) Final   POC Buprenorphine (BUP) 08/10/2022 None Detected  NONE DETECTED (Cut Off Level 10 ng/mL) Final   POC Oxazepam (BZO) 08/10/2022 Positive (A)  NONE DETECTED (Cut Off Level 300 ng/mL) Final   POC Cocaine UR 08/10/2022 None Detected  NONE DETECTED (Cut Off Level 300 ng/mL) Final   POC Methamphetamine UR 08/10/2022 None Detected  NONE DETECTED (Cut Off Level 1000 ng/mL) Final   POC Morphine 08/10/2022 None Detected  NONE DETECTED (Cut Off Level 300 ng/mL) Final   POC Methadone UR 08/10/2022 None  Detected  NONE DETECTED (Cut Off Level 300 ng/mL) Final   POC Oxycodone UR 08/10/2022 None Detected  NONE DETECTED (Cut Off Level 100 ng/mL) Final   POC Marijuana UR 08/10/2022 None Detected  NONE DETECTED (Cut Off Level 50 ng/mL) Final    Allergies: Other, Tramadol, Cortisone, Abilify [aripiprazole], Elemental sulfur, and Latuda [lurasidone]  PTA Medications: (Not in a hospital admission)   Medical Decision Making  Inpatient observation Meds ordered this encounter  Medications   DISCONTD: amLODipine (NORVASC) tablet 5 mg   DISCONTD: carvedilol (COREG) tablet 12.5 mg   DISCONTD: lamoTRIgine (LAMICTAL) tablet 125 mg   DISCONTD: ondansetron (ZOFRAN-ODT) disintegrating tablet 8 mg   DISCONTD: QUEtiapine (SEROQUEL) tablet 25 mg   DISCONTD: traZODone (DESYREL) tablet 100 mg   DISCONTD: acetaminophen (TYLENOL) tablet 650 mg   DISCONTD: alum & mag hydroxide-simeth (MAALOX/MYLANTA) 200-200-20 MG/5ML suspension 30 mL   DISCONTD: magnesium hydroxide (MILK OF MAGNESIA) suspension 30 mL   amLODipine (NORVASC) tablet 5 mg   carvedilol (COREG) tablet 12.5 mg   lamoTRIgine (LAMICTAL) tablet 125 mg   ondansetron (ZOFRAN-ODT) disintegrating tablet 8 mg   QUEtiapine (SEROQUEL) tablet 25 mg   traZODone (DESYREL) tablet 100 mg   acetaminophen (TYLENOL) tablet 650 mg   alum & mag hydroxide-simeth (MAALOX/MYLANTA) 200-200-20 MG/5ML suspension 30 mL   magnesium hydroxide (MILK OF MAGNESIA) suspension 30 mL   acetaminophen (TYLENOL) tablet 650 mg   alum & mag hydroxide-simeth (MAALOX/MYLANTA) 200-200-20 MG/5ML suspension 30 mL   magnesium hydroxide (MILK OF MAGNESIA) suspension 30 mL    Lab Orders         Resp Panel by RT-PCR (Flu A&B, Covid) Anterior Nasal Swab         Resp Panel by RT-PCR (Flu A&B, Covid) Anterior Nasal Swab         Resp Panel by RT-PCR (Flu A&B, Covid) Anterior Nasal Swab         CBC with Differential/Platelet         Comprehensive metabolic panel         Hemoglobin A1c          TSH  Lipid panel         POC urine preg, ED         POCT Urine Drug Screen - (I-Screen)         POC urine preg, ED         POCT Urine Drug Screen - (I-Screen)         POC urine preg, ED         POCT Urine Drug Screen - (I-Screen)       Recommendations  Based on my evaluation the patient appears to have an emergency medical condition for which I recommend the patient be transferred to the emergency department for further evaluation.  Evette Georges, NP 08/10/22  10:39 PM

## 2022-08-10 NOTE — ED Provider Notes (Signed)
[facility] Urgent Care Continuous Assessment Admission H&P  Date: 08/10/22 Patient Name: Kara Stephens MRN: [id number] Chief Complaint:  Chief Complaint  Patient presents with   Suicidal      Diagnoses:  Final diagnoses:  Suicidal ideation  Bipolar I disorder (HCC)  Postpartum depression    HPI: Kara Stephens,  [place]. Female with a history of bipolar disorder, presented to [facility] voluntarily accompanied by her husband.  According to patient she is suicidal with plans to jump off her balcony.  When asked if there is any stressors patient stated she just feels depressed, patient recently had a newborn.  Patient is followed by psychiatry at [facility] psychiatric associate.  Patient is not currently seeing a therapist patient is prescribed the following medications Lamictal, trazodone, Zyprexa, Ativan.  According to patient she lives at home with her husband to older kids and a young baby.  Patient was last hospitalized at [facility] and also [facility] for mania.  Face-to-face observation of patient, patient is alert and oriented x 4, speech is clear, maintaining eye contact.  Upon entering the room patient is observed laying on the chair patient is very depressed, very withdrawn.  Mood is depressed affect is flat congruent with mood.  Patient endorsed suicidal ideation with plans to jump off of the balcony at her apartment building.  Patient denies HI, AVH or paranoia.  Patient denies alcohol use, denies smoking, denies illicit drug use.  Recommend inpatient observation and further assessment.  PHQ 2-9:  Flowsheet Row ED from 08/10/2022 in [facility]  Thoughts that you would be better off dead, or of hurting yourself in some way More than half the days  PHQ-9 Total Score 20       Flowsheet Row ED from 08/10/2022 in [facility]  C-SSRS RISK CATEGORY Error: Q7 should not be populated when Q6 is No        Total Time spent with  patient: 20 minutes  Musculoskeletal  Strength & Muscle Tone: within normal limits Gait & Station: normal Patient leans: N/A  Psychiatric Specialty Exam  Presentation General Appearance:  Casual  Eye Contact: Fair  Speech: Clear and Coherent  Speech Volume: Normal  Handedness: Right   Mood and Affect  Mood: Anxious; Depressed; Hopeless; Worthless  Affect: Depressed   Thought Process  Thought Processes: Linear  Descriptions of Associations:Circumstantial  Orientation:Full (Time, Place and Person)  Thought Content:WDL    Hallucinations:Hallucinations: None  Ideas of Reference:None  Suicidal Thoughts:Suicidal Thoughts: Yes, Active SI Active Intent and/or Plan: With Plan  Homicidal Thoughts:Homicidal Thoughts: No   Sensorium  Memory: Immediate Fair  Judgment: Fair  Insight: Fair   Chartered certified accountant: Fair  Attention Span: Fair  Recall: [employer] of Knowledge: Fair  Language: Fair   Psychomotor Activity  Psychomotor Activity: Psychomotor Activity: Normal   Assets  Assets: Desire for Improvement; Resilience   Sleep  Sleep: Sleep: Fair Number of Hours of Sleep: 5   Nutritional Assessment (For OBS and FBC admissions only) Has the patient had a weight loss or gain of 10 pounds or more in the last 3 months?: No Has the patient had a decrease in food intake/or appetite?: No Does the patient have dental problems?: No Does the patient have eating habits or behaviors that may be indicators of an eating disorder including binging or inducing vomiting?: No Has the patient recently lost weight without trying?: 0 Has the patient been eating poorly because of a  decreased appetite?: 0 Malnutrition Screening Tool Score: 0    Physical Exam HENT:     Head: Normocephalic.     Nose: Nose normal.  Cardiovascular:     Rate and Rhythm: Normal rate.  Pulmonary:     Effort: Pulmonary effort is normal.   Musculoskeletal:        General: Normal range of motion.     Cervical back: Normal range of motion.  Neurological:     General: No focal deficit present.     Mental Status: She is alert.  Psychiatric:        Mood and Affect: Mood normal.        Behavior: Behavior normal.        Thought Content: Thought content normal.        Judgment: Judgment normal.    Review of Systems  Constitutional: Negative.   HENT: Negative.    Eyes: Negative.   Respiratory: Negative.    Cardiovascular: Negative.   Gastrointestinal: Negative.   Musculoskeletal: Negative.   Skin: Negative.   Neurological: Negative.   Endo/Heme/Allergies: Negative.   Psychiatric/Behavioral:  Positive for depression and suicidal ideas. The patient is nervous/anxious.     Blood pressure (!) 133/94, pulse 92, temperature 98.4 F (36.9 C), temperature source Oral, resp. rate 18, SpO2 99 %. There is no height or weight on file to calculate BMI.  Past Psychiatric History: Bipolar disorder 1  Is the patient at risk to self? Yes  Has the patient been a risk to self in the past 6 months? Yes .    Has the patient been a risk to self within the distant past? Yes   Is the patient a risk to others? Yes   Has the patient been a risk to others in the past 6 months? No   Has the patient been a risk to others within the distant past? No   Past Medical History:  Past Medical History:  Diagnosis Date   Anxiety    Back spasm    Bipolar I disorder (HCC)    Hypertension    Tachycardia     Past Surgical History:  Procedure Laterality Date   APPENDECTOMY     CHOLECYSTECTOMY     HAND SURGERY     SHOULDER SURGERY      Family History:  Family History  Problem Relation Age of Onset   Hypertension Mother    Hyperlipidemia Mother    Suicidality Father    Depression Father     Social History:  Social History   Socioeconomic History   Marital status: Married    Spouse name: Not on file   Number of children: Not on file    Years of education: Not on file   Highest education level: Not on file  Occupational History   Not on file  Tobacco Use   Smoking status: Former    Types: Cigarettes    Quit date: 09/10/2016    Years since quitting: 5.9   Smokeless tobacco: Never  Vaping Use   Vaping Use: Never used  Substance and Sexual Activity   Alcohol use: No   Drug use: No   Sexual activity: Not on file  Other Topics Concern   Not on file  Social History Narrative   Not on file   Social Determinants of Health   Financial Resource Strain: Not on file  Food Insecurity: Not on file  Transportation Needs: Not on file  Physical Activity: Not on file  Stress: Not  on file  Social Connections: Not on file  Intimate Partner Violence: Not on file    SDOH:  SDOH Screenings   Depression (PHQ2-9): High Risk (08/10/2022)  Tobacco Use: Medium Risk (07/31/2020)    Last Labs:  No visits with results within 6 Month(s) from this visit.  Latest known visit with results is:  Admission on 09/11/2019, Discharged on 09/11/2019  Component Date Value Ref Range Status   SARS-CoV-2, NAA 09/11/2019 Detected (A)  Not Detected Final   Comment: This nucleic acid amplification test was developed and its performance characteristics determined by [employer]. Nucleic acid amplification tests include PCR and TMA. This test has not been FDA cleared or approved. This test has been authorized by FDA under an Emergency Use Authorization (EUA). This test is only authorized for the duration of time the declaration that circumstances exist justifying the authorization of the emergency use of in vitro diagnostic tests for detection of SARS-CoV-2 virus and/or diagnosis of COVID-19 infection under section 564(b)(1) of the Act, 21 U.S.C. 426STM-1(D) (1), unless the authorization is terminated or revoked sooner. When diagnostic testing is negative, the possibility of a false negative result should be considered in the context  of a patient's recent exposures and the presence of clinical signs and symptoms consistent with COVID-19. An individual without symptoms of COVID-19 and who is not shedding SARS-CoV-2 virus would                           expect to have a negative (not detected) result in this assay.     Allergies: Other, Tramadol, Cortisone, Abilify [aripiprazole], Elemental sulfur, and Latuda [lurasidone]  PTA Medications: (Not in a hospital admission)   Medical Decision Making  Inpatient observation    Recommendations  Based on my evaluation the patient appears to have an emergency medical condition for which I recommend the patient be transferred to the emergency department for further evaluation.  Sindy Guadeloupe, NP 08/10/22  8:51 PM

## 2022-08-10 NOTE — BH Assessment (Signed)
Kara Stephens, [place], 36 year old presents this date with her husband, Adean Milosevic, [phone].  Pt reports SI with a plan to jump from second floor apartment complex.  Pt denies HI, or AVH.  Pt reports a history of bipolar and has been feeling increasing hypo-manic.  Pt admits to prior MH diagnosis; also, reports medication changed due to breat feeding.  Pt signed MSE.

## 2022-08-10 NOTE — BH Assessment (Signed)
Comprehensive Clinical Assessment (CCA) Note  08/10/2022 BRADY PLANT [id number]  Disposition: Sindy Guadeloupe completed Medical Screening Exam and recommends overnight continuous assessment.   The patient demonstrates the following risk factors for suicide: Chronic risk factors for suicide include: psychiatric disorder of bipolar . Acute risk factors for suicide include: unemployment. Protective factors for this patient include: positive social support and responsibility to others (children, family). Considering these factors, the overall suicide risk at this point appears to be high. Patient is not appropriate for outpatient follow up.  Flowsheet Row ED from 08/10/2022 in [facility] Most recent reading at 08/10/2022 10:10 PM ED from 08/10/2022 in [facility] Most recent reading at 08/10/2022  7:46 PM  C-SSRS RISK CATEGORY High Risk Error: Q7 should not be populated when Q6 is No      Kara Stephens is a 36 y.o. married female who voluntarily to [facility] accompanied by her spouse Kara Stephens [phone]. Pt reports a history of bipolar and states she has been experiencing SI with a plan to jump off the 2nd floor of her apartment. Pt states she has been "down awhile," however experiencing SI for a few days. Pt acknowledges symptoms including a decrease in energy, hopelessness, worthlessness and lack of sleep. Pt denies any recent manic symptoms. She also denies previous suicide attempts. She reports two previous hospitalizations in 2016 and 2020 due to mania. Pt states she was hospitalized for 1 month in 2016 at [facility] and for 10 days at [facility] in 2020. Pt denies any history of injurious behaviors. Pt denies current SI, auditory or visual hallucinations.  Pt denies any history of substance use.   Pt reports she does not know what triggered her current SI. Pt identifies her primary stressors as finances and lack  of food in the home. Pt reports the family does receive food stamps and her husband is the only source of income. Pt lives with her husband, 72 y.o. daughter, 76 y.o. daughter, and 32 month old son. Pt identifies her family as the primary supports. Pt says she does not sleep well at night.  Pt denies any history of abuse or trauma, however a review of chart notates Pt's father passed by suicide in 2016, which was difficult for Pt. Pt denies any current legal problems.  Pt states she currently receives medication management with Kara Noon, NP of [facility]. Pt reports a recent medication change due to breast feeds and states she takes her medications as prescribed.   Pt is dressed casually in tights and a shirt. She is alert, oriented x4 with normal speech. Pt has good eye contact and a flat affect. Pt's mood is depressed. Pt's thought process is coherent and she has good insight. Pt was cooperative throughout assessment and expressed she thinks it would be good to be observed overnight.  Chief Complaint:  Chief Complaint  Patient presents with   Suicidal   Visit Diagnosis: Suicidal    CCA Screening, Triage and Referral (STR)  Patient Reported Information How did you hear about [place]? Other (Comment) (Psychiatirst)  What Is the Reason for Your Visit/Call Today? SI with a plan to jump from 2nd floor of apartment complex  How Long Has This Been Causing You Problems? <Week  What Do You Feel Would Help You the Most Today? Treatment for Depression or other mood problem   Have You Recently Had Any Thoughts About Hurting Yourself? Yes  Are You Planning  to Commit Suicide/Harm Yourself At This time? Yes   [facility] Most recent reading at 08/10/2022 10:10 PM [facility] Most recent reading at 08/10/2022  7:46 PM  C-SSRS RISK CATEGORY High Risk Error: Q7 should  not be populated when Q6 is No       Have you Recently Had Thoughts About Hurting Someone Kara Stephens? No  Are You Planning to Harm Someone at This Time? No  Explanation: N/A   Have You Used Any Alcohol or Drugs in the Past 24 Hours? No  What Did You Use and How Much? N/A   Do You Currently Have a Therapist/Psychiatrist? Yes  Name of Therapist/Psychiatrist: Name of Therapist/Psychiatrist: Melissa NoonKaren Shugart, NP with [facility]   Have You Been Recently Discharged From Any Office Practice or Programs? No  Explanation of Discharge From Practice/Program: N/A     CCA Screening Triage Referral Assessment Type of Contact: Face-to-Face  Telemedicine Service Delivery:   Is this Initial or Reassessment?   Date Telepsych consult ordered in [facility]:    Time Telepsych consult ordered in [facility]:    [facility]  [facility]   Collateral Involvement: N/A   Does Patient Have a Automotive engineerCourt Appointed Legal Guardian? No  Legal Guardian Contact Information: N/A  Copy of Legal Guardianship Form: -- (N/A)  Legal Guardian Notified of Arrival: -- (N/A)  Legal Guardian Notified of Pending Discharge: -- (N/A)  If Minor and Not Living with Parent(s), Who has Custody? N/A  Is CPS involved or ever been involved? Never  Is APS involved or ever been involved? Never   Patient Determined To Be At Risk for Harm To Self or Others Based on Review of Patient Reported Information or Presenting Complaint? Yes, for Self-Harm  Method: -- (N/A)  Availability of Means: -- (N/A)  Intent: -- (N/A)  Notification Required: -- (N/A)  Additional Information for Danger to Others Potential: -- (N/A)  Additional Comments for Danger to Others Potential: N/A  Are There Guns or Other Weapons in Your Home? No  Types of Guns/Weapons: N/A  Are These Weapons Safely Secured?                            -- (N/A)  Who Could Verify  You Are Able To Have These Secured: N/A  Do You Have any Outstanding Charges, Pending Court Dates, Parole/Probation? No  Contacted To Inform of Risk of Harm To Self or Others: -- (N/A)    Does Patient Present under Involuntary Commitment? No    [place]   Patient Currently Receiving the Following Services: Medication Management   Determination of Need: Urgent (48 hours)   Options For Referral: Inpatient Hospitalization; Outpatient Therapy     CCA Biopsychosocial Patient Reported Schizophrenia/Schizoaffective Diagnosis in Past: No   Strengths: [place] mother and wife.   Mental Health Symptoms Depression:   Hopelessness; Sleep (too much or little); Worthlessness; Change in energy/activity   Duration of Depressive symptoms:  Duration of Depressive Symptoms: Greater than two weeks   Mania:   None   Anxiety:    Restlessness; Worrying; Sleep   Psychosis:   None   Duration of Psychotic symptoms:    Trauma:   None   Obsessions:   None   Compulsions:   None   Inattention:   None  Hyperactivity/Impulsivity:   None   Oppositional/Defiant Behaviors:   None   Emotional Irregularity:   None   Other Mood/Personality Symptoms:   N/A    Mental Status Exam Appearance and self-care  Stature:   Average   Weight:   Average weight   Clothing:   Casual   Grooming:   Normal   Cosmetic use:   None   Posture/gait:   Normal   Motor activity:   Not Remarkable   Sensorium  Attention:   Normal   Concentration:   Normal   Orientation:   X5   Recall/memory:   Normal   Affect and Mood  Affect:   Flat   Mood:   Worthless   Relating  Eye contact:   Normal   Facial expression:   Sad   Attitude toward examiner:   Cooperative   Thought and Language  Speech flow:  Normal   Thought content:   Appropriate to Mood and Circumstances   Preoccupation:   None   Hallucinations:   None   Organization:    Linear   Company secretary of Knowledge:   Average   Intelligence:   Average   Abstraction:   Normal   Judgement:   Fair   Dance movement psychotherapist:   Adequate   Insight:   Good   Decision Making:   Normal   Social Functioning  Social Maturity:   Responsible   Social Judgement:   Normal   Stress  Stressors:   Office manager Ability:   Overwhelmed; Exhausted   Skill Deficits:   None   Supports:   Family     Religion: Religion/Spirituality Are You A Religious Person?: Yes What is Your Religious Affiliation?: Christian How Might This Affect Treatment?: N/A  Leisure/Recreation: Leisure / Recreation Do You Have Hobbies?: Yes Leisure and Hobbies: Cooking  Exercise/Diet: Exercise/Diet Do You Exercise?: No Have You Gained or Lost A Significant Amount of Weight in the Past Six Months?: Yes-Lost (Had a child 4 months ago) Number of Pounds Lost?:  (Unknown) Do You Follow a Special Diet?: No Do You Have Any Trouble Sleeping?: Yes Explanation of Sleeping Difficulties: Pt states she does not sleep at night   CCA Employment/Education Employment/Work Situation: Employment / Work Situation Employment Situation: Unemployed Patient's Job has Been Impacted by Current Illness: No Has Patient ever Been in Equities trader?: No  Education: Education Is Patient Currently Attending School?: No Last Grade Completed: 12 Did You Product manager?: Yes 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, site Did You Have An Individualized Education Program (IIEP): No Did You Have Any Difficulty At School?: No Patient's Education Has Been Impacted by Current Illness: No   CCA Family/Childhood History Family and Relationship History: Family history Marital status: Married Number of Years Married: 5 What types of issues is patient dealing with in the relationship?: Pt reports none Additional relationship information: N/A Does patient have children?:  Yes How many children?: 3 How is patient's relationship with their children?: Positive  Childhood History:  Childhood History By whom was/is the patient raised?: Both parents Did patient suffer any verbal/emotional/physical/sexual abuse as a child?: No Did patient suffer from severe childhood neglect?: No Has patient ever been sexually abused/assaulted/raped as an adolescent or adult?: No Was the patient ever a victim of a crime or a disaster?: No Witnessed domestic violence?: No Has patient been affected by domestic violence as an adult?: No       CCA Substance Use  Alcohol/Drug Use: Alcohol / Drug Use Pain Medications: See MAR Prescriptions: Trazadone, Zyprexa, Lorazepam, Lamictal Over the Counter: See MAR History of alcohol / drug use?: No history of alcohol / drug abuse Longest period of sobriety (when/how long): N/A Negative Consequences of Use:  (N/A) Withdrawal Symptoms:  (N/A)                         ASAM's:  Six Dimensions of Multidimensional Assessment  Dimension 1:  Acute Intoxication and/or Withdrawal Potential:      Dimension 2:  Biomedical Conditions and Complications:      Dimension 3:  Emotional, Behavioral, or Cognitive Conditions and Complications:     Dimension 4:  Readiness to Change:     Dimension 5:  Relapse, Continued use, or Continued Problem Potential:     Dimension 6:  Recovery/Living Environment:     ASAM Severity Score:    ASAM Recommended Level of Treatment:     Substance use Disorder (SUD)    Recommendations for Services/Supports/Treatments:    Discharge Disposition:    DSM5 Diagnoses: Patient Active Problem List   Diagnosis Date Noted   ASTHMA 03/20/2009   PAIN IN JOINT, ANKLE AND FOOT 03/20/2009   SPRAIN/STRAIN, ANKLE NOS 03/20/2009     Referrals to Alternative Service(s): Referred to Alternative Service(s):   Place:   Date:   Time:    Referred to Alternative Service(s):   Place:   Date:   Time:    Referred to  Alternative Service(s):   Place:   Date:   Time:    Referred to Alternative Service(s):   Place:   Date:   Time:     Cleda Clarks, Theresia Majors

## 2022-08-11 ENCOUNTER — Encounter (HOSPITAL_COMMUNITY): Payer: Self-pay | Admitting: Student

## 2022-08-11 ENCOUNTER — Inpatient Hospital Stay (HOSPITAL_COMMUNITY)
Admission: AD | Admit: 2022-08-11 | Discharge: 2022-09-04 | DRG: 885 | Disposition: A | Discharge status: Home / Self Care | Payer: Medicaid Other | Source: Intra-hospital | Attending: Psychiatry | Admitting: Psychiatry

## 2022-08-11 DIAGNOSIS — Z888 Allergy status to other drugs, medicaments and biological substances status: Secondary | ICD-10-CM | POA: Diagnosis not present

## 2022-08-11 DIAGNOSIS — Z79899 Other long term (current) drug therapy: Secondary | ICD-10-CM

## 2022-08-11 DIAGNOSIS — Z7984 Long term (current) use of oral hypoglycemic drugs: Secondary | ICD-10-CM | POA: Diagnosis not present

## 2022-08-11 DIAGNOSIS — L0591 Pilonidal cyst without abscess: Secondary | ICD-10-CM | POA: Diagnosis present

## 2022-08-11 DIAGNOSIS — U071 COVID-19: Secondary | ICD-10-CM | POA: Diagnosis present

## 2022-08-11 DIAGNOSIS — Z9049 Acquired absence of other specified parts of digestive tract: Secondary | ICD-10-CM

## 2022-08-11 DIAGNOSIS — F419 Anxiety disorder, unspecified: Secondary | ICD-10-CM | POA: Diagnosis present

## 2022-08-11 DIAGNOSIS — L680 Hirsutism: Secondary | ICD-10-CM | POA: Diagnosis present

## 2022-08-11 DIAGNOSIS — R45850 Homicidal ideations: Secondary | ICD-10-CM | POA: Diagnosis present

## 2022-08-11 DIAGNOSIS — Z818 Family history of other mental and behavioral disorders: Secondary | ICD-10-CM

## 2022-08-11 DIAGNOSIS — J45909 Unspecified asthma, uncomplicated: Secondary | ICD-10-CM | POA: Diagnosis present

## 2022-08-11 DIAGNOSIS — F061 Catatonic disorder due to known physiological condition: Secondary | ICD-10-CM | POA: Diagnosis present

## 2022-08-11 DIAGNOSIS — F429 Obsessive-compulsive disorder, unspecified: Secondary | ICD-10-CM | POA: Diagnosis present

## 2022-08-11 DIAGNOSIS — G4700 Insomnia, unspecified: Secondary | ICD-10-CM | POA: Diagnosis present

## 2022-08-11 DIAGNOSIS — Z975 Presence of (intrauterine) contraceptive device: Secondary | ICD-10-CM | POA: Diagnosis not present

## 2022-08-11 DIAGNOSIS — F314 Bipolar disorder, current episode depressed, severe, without psychotic features: Principal | ICD-10-CM | POA: Diagnosis present

## 2022-08-11 DIAGNOSIS — Z885 Allergy status to narcotic agent status: Secondary | ICD-10-CM

## 2022-08-11 DIAGNOSIS — I10 Essential (primary) hypertension: Secondary | ICD-10-CM | POA: Diagnosis present

## 2022-08-11 DIAGNOSIS — Z87891 Personal history of nicotine dependence: Secondary | ICD-10-CM

## 2022-08-11 DIAGNOSIS — F3160 Bipolar disorder, current episode mixed, unspecified: Principal | ICD-10-CM | POA: Diagnosis present

## 2022-08-11 DIAGNOSIS — R45851 Suicidal ideations: Secondary | ICD-10-CM | POA: Diagnosis present

## 2022-08-11 LAB — RESP PANEL BY RT-PCR (FLU A&B, COVID) ARPGX2
Influenza A by PCR: NEGATIVE
Influenza B by PCR: NEGATIVE
SARS Coronavirus 2 by RT PCR: NEGATIVE

## 2022-08-11 LAB — COMPREHENSIVE METABOLIC PANEL
ALT: 19 U/L (ref 0–44)
AST: 14 U/L — ABNORMAL LOW (ref 15–41)
Albumin: 4.1 g/dL (ref 3.5–5.0)
Alkaline Phosphatase: 118 U/L (ref 38–126)
Anion gap: 9 (ref 5–15)
BUN: 13 mg/dL (ref 6–20)
CO2: 23 mmol/L (ref 22–32)
Calcium: 9.3 mg/dL (ref 8.9–10.3)
Chloride: 105 mmol/L (ref 98–111)
Creatinine, Ser: 0.98 mg/dL (ref 0.44–1.00)
GFR, Estimated: >60 mL/min (ref >60)
Glucose, Bld: 83 mg/dL (ref 70–99)
Potassium: 3.7 mmol/L (ref 3.5–5.1)
Sodium: 137 mmol/L (ref 135–145)
Total Bilirubin: 0.4 mg/dL (ref 0.3–1.2)
Total Protein: 7 g/dL (ref 6.5–8.1)

## 2022-08-11 LAB — URINALYSIS, ROUTINE W REFLEX MICROSCOPIC
Bilirubin Urine: NEGATIVE
Glucose, UA: NEGATIVE mg/dL
Hgb urine dipstick: NEGATIVE
Ketones, ur: NEGATIVE mg/dL
Nitrite: NEGATIVE
Protein, ur: 30 mg/dL — AB
Specific Gravity, Urine: 1.025 (ref 1.005–1.030)
pH: 5 (ref 5.0–8.0)

## 2022-08-11 LAB — TSH: TSH: 1.365 u[IU]/mL (ref 0.350–4.500)

## 2022-08-11 LAB — ETHANOL: Alcohol, Ethyl (B): <10 mg/dL (ref <10)

## 2022-08-11 MED ORDER — LAMOTRIGINE 150 MG PO TABS: 150.0000 mg | ORAL_TABLET | Freq: Every day | ORAL | Status: DC

## 2022-08-11 MED ORDER — CLONAZEPAM 0.5 MG PO TABS
0.5000 mg | ORAL_TABLET | Freq: Two times a day (BID) | ORAL | Status: DC
  Administered 2022-08-11 – 2022-08-12 (×2): 0.5 mg via ORAL
  Filled 2022-08-11 (×2): qty 1

## 2022-08-11 MED ORDER — CLONAZEPAM 0.5 MG PO TABS
0.5000 mg | ORAL_TABLET | Freq: Two times a day (BID) | ORAL | Status: DC
  Administered 2022-08-11: 0.5 mg via ORAL
  Filled 2022-08-11: qty 1

## 2022-08-11 MED ORDER — LORAZEPAM 0.5 MG PO TABS
0.5000 mg | ORAL_TABLET | Freq: Two times a day (BID) | ORAL | Status: DC
  Filled 2022-08-11: qty 1

## 2022-08-11 MED ORDER — TRAZODONE HCL 100 MG PO TABS: 100.0000 mg | ORAL_TABLET | Freq: Every evening | ORAL | Status: DC | PRN

## 2022-08-11 MED ORDER — CLONAZEPAM 0.5 MG PO TABS: 0.5000 mg | ORAL_TABLET | Freq: Two times a day (BID) | ORAL | 0 refills | Status: DC

## 2022-08-11 MED ORDER — ALBUTEROL SULFATE HFA 108 (90 BASE) MCG/ACT IN AERS: 2.0000 | INHALATION_SPRAY | RESPIRATORY_TRACT | Status: DC | PRN

## 2022-08-11 MED ORDER — MAGNESIUM HYDROXIDE 400 MG/5ML PO SUSP: 30.0000 mL | Freq: Every day | ORAL | Status: DC | PRN

## 2022-08-11 MED ORDER — NIFEDIPINE ER OSMOTIC RELEASE 30 MG PO TB24: 90.0000 mg | ORAL_TABLET | Freq: Every day | ORAL | Status: DC

## 2022-08-11 MED ORDER — QUETIAPINE FUMARATE 100 MG PO TABS: 100.0000 mg | ORAL_TABLET | Freq: Every day | ORAL | Status: DC

## 2022-08-11 MED ORDER — ALUM & MAG HYDROXIDE-SIMETH 200-200-20 MG/5ML PO SUSP
30.0000 mL | ORAL | Status: DC | PRN
  Administered 2022-08-29: 30 mL via ORAL
  Filled 2022-08-11: qty 30

## 2022-08-11 MED ORDER — LAMOTRIGINE 150 MG PO TABS
150.0000 mg | ORAL_TABLET | Freq: Every day | ORAL | Status: DC
  Administered 2022-08-11: 150 mg via ORAL
  Filled 2022-08-11: qty 1

## 2022-08-11 MED ORDER — ACETAMINOPHEN 325 MG PO TABS
650.0000 mg | ORAL_TABLET | Freq: Four times a day (QID) | ORAL | Status: DC | PRN
  Administered 2022-08-15 – 2022-08-21 (×3): 650 mg via ORAL
  Filled 2022-08-11 (×3): qty 2

## 2022-08-11 MED ORDER — LAMOTRIGINE 150 MG PO TABS
150.0000 mg | ORAL_TABLET | Freq: Every day | ORAL | Status: DC
  Administered 2022-08-12 – 2022-08-14 (×3): 150 mg via ORAL
  Filled 2022-08-11 (×6): qty 1

## 2022-08-11 MED ORDER — HYDROXYZINE HCL 25 MG PO TABS
25.0000 mg | ORAL_TABLET | Freq: Three times a day (TID) | ORAL | Status: DC | PRN
  Administered 2022-08-12 – 2022-09-01 (×9): 25 mg via ORAL
  Filled 2022-08-11 (×9): qty 1

## 2022-08-11 MED ORDER — QUETIAPINE FUMARATE 100 MG PO TABS
100.0000 mg | ORAL_TABLET | Freq: Every day | ORAL | Status: DC
  Administered 2022-08-11: 100 mg via ORAL
  Filled 2022-08-11 (×4): qty 1

## 2022-08-11 MED ORDER — TRAZODONE HCL 100 MG PO TABS
100.0000 mg | ORAL_TABLET | Freq: Every evening | ORAL | Status: DC | PRN
  Administered 2022-08-11: 100 mg via ORAL
  Filled 2022-08-11: qty 1

## 2022-08-11 MED ORDER — NIFEDIPINE ER 30 MG PO TB24
90.0000 mg | ORAL_TABLET | Freq: Every day | ORAL | Status: DC
  Administered 2022-08-11 – 2022-08-23 (×13): 90 mg via ORAL
  Filled 2022-08-11 (×16): qty 3
  Filled 2022-08-11: qty 1
  Filled 2022-08-11: qty 3

## 2022-08-11 MED ORDER — TRAZODONE HCL 100 MG PO TABS: 200.0000 mg | ORAL_TABLET | Freq: Every day | ORAL | Status: DC

## 2022-08-11 NOTE — ED Provider Notes (Cosign Needed Addendum)
FBC/OBS ASAP Discharge Summary  Date and Time: 08/11/2022 1:01 PM  Name: Kara Stephens  MRN:  [id number]   Discharge Diagnoses:  Final diagnoses:  Bipolar I disorder (HCC)  Postpartum psychosis (HCC)   Reason for admission:   Kara Stephens is a 36 yo female w/ hx of type 1 bipolar disorder, remote hx of med noncompliance, recent childbirth 04/07/22 presenting to [facility] due to SI with plan to jump off balcony and thoughts to harm 41 month old baby. Current outpatient regiment is lamictal 150, ativan 0.5 mg daily prn, trazodone 200 mg, zyprexa 5 mg qhs.  Subjective:  Patient seen and assessed at bedside.  Patient reports having intrusive passive SI with plan to jump off balcony and HI to throw newborn child off balcony. She states these thoughts are intrusive and she has no intent to do this but she is concerned given how little control she has over these thoughts and she visualizes these thoughts very clearly.  She stated she has been in the state of hypomania after giving birth to her newborn child approximately 4 months ago.  She states her symptoms included racing thoughts, poor sleep, pressured speech, increased impulsivity, psychomotor agitation. She states her medications were adjusted to account for this hypomania but then she began to feel more depressed especially feeling like she would "going through the motions".  She states that this was around the time she was switched from Seroquel 25 mg to Zyprexa and titrated up to 20 mg nightly.  She stated that she felt like this may have contributed to her depression as she felt that she was not like herself.  She was slowly titrated off her Zyprexa recently but when she was titrated down from 10 mg Zyprexa to 5 mg Zyprexa approximately 4 days ago, she began to experience intrusive SI and HI that warranted her to come into the urgent care.  Continues to endorse these intrusive thoughts of SI/HI.  Is currently amenable to voluntary admission to  [facility].  Patient does have an IUD in place and is currently not breast-feeding. She states she has been taking all psycotropic medication as prescribed.  Collateral husband Kara Stephens)- [phone] Husband states that patient has been in a hypomanic episode after giving birth to their newborn child.  He states that she has been less hypomanic after being titrated up to Zyprexa 20 mg nightly; however, she suspects that her depressive symptoms were related to patient feeling like a "zombie" while on higher dose of zyprexa.  Her outpatient psychiatrist began to titrate Zyprexa down as well as switched her from clonazepam to Ativan in order likely to reduce benzodiazepene use.  He states that patient has not slept very well for some time now.  He states that she has slept most recently it was approximately 5 hours after she has been told to take Zyprexa 10 mg twice daily again after reporting to outpatient psychiatrist of intrusive thoughts.  Husband expresses concern regarding using any new medications as she had not tolerated lurasidone or Abilify ("terrible side effects", but did not specify. Per allergy list, "psychosis, muscle spasms, agitation, and incontinence").  Supportive of patient receiving psychiatric care would like to be updated on any further medication adjustments (patient was agreeable to having husband taking part in this aspect of her care). Husband did state she had been stable for 5 years on lamotrigine, seroquel 25, and klonopin  Stay Summary:  Patient's medications were adjusted to account for her most recent outpatient  medication regimen.  She was switched from Zyprexa to Seroquel 100 mg given patient's preference.  I warned her regarding side effects and possibility she will need to be switched back to zyprexa or uptitrated if she is still not tolerating medications well.  I switched her back to clonazepam she has elevated anxiety at this time and does not appear to be sleeping well  still. Discussed that inpatient provider may increase her lamictal to better aid with refractory hypomania.  Patient verbalized understanding and had no other questions at this time.  Patient continues to be agreeable to voluntary admission to behavioral health hospital for inpatient psychiatric stabilization  Total Time spent with patient: 45 minutes  Past Psychiatric History:  Previous Psych Diagnoses: Type I bipolar disorder, cannabis use disorder (in sustained remission) Prior inpatient treatment:  07/2015 for first manic episode, stabilized on lithium 450 mg bid, trazodone 150 mg 09/2019 for mania after hospitalization with COVID+zoloft+steroids for COVID, stabilized on lithium 600 mg bid Current/prior outpatient treatment: endorses Psychotherapy hx: unknown History of suicide: denies History of homicide: denies Psychiatric medication history:zoloft (became manic), effexor (prior to 1st manic episode)  Psychiatric medication compliance history: fair Neuromodulation history: denies Current Psychiatrist: Kerby Nora with [facility] Current therapist: unknown Past Medical History:  Past Medical History:  Diagnosis Date   Anxiety    Back spasm    Bipolar I disorder ([place])    Hypertension    Tachycardia     Past Surgical History:  Procedure Laterality Date   APPENDECTOMY     CHOLECYSTECTOMY     HAND SURGERY     SHOULDER SURGERY     Family History:  Family History  Problem Relation Age of Onset   Hypertension Mother    Hyperlipidemia Mother    Suicidality Father    Depression Father    Family Psychiatric History:  Dx: father with bipolar disorder SA/HA: dad completed suicide, paternal uncle and grandmother attempted suicide Substance use: extensive fhx of alcohol use  Social History:  Social History   Substance and Sexual Activity  Alcohol Use No     Social History   Substance and Sexual Activity  Drug Use No    Social History    Socioeconomic History   Marital status: Married    Spouse name: Not on file   Number of children: Not on file   Years of education: Not on file   Highest education level: Not on file  Occupational History   Not on file  Tobacco Use   Smoking status: Former    Types: Cigarettes    Quit date: 09/10/2016    Years since quitting: 5.9   Smokeless tobacco: Never  Vaping Use   Vaping Use: Never used  Substance and Sexual Activity   Alcohol use: No   Drug use: No   Sexual activity: Not on file  Other Topics Concern   Not on file  Social History Narrative   Not on file   Social Determinants of Health   Financial Resource Strain: Not on file  Food Insecurity: Not on file  Transportation Needs: Not on file  Physical Activity: Not on file  Stress: Not on file  Social Connections: Not on file   SDOH:  SDOH Screenings   Depression (PHQ2-9): High Risk (08/10/2022)  Tobacco Use: Medium Risk (07/31/2020)    Tobacco Cessation:  N/A, patient does not currently use tobacco products  Current Medications:  Current Facility-Administered Medications  Medication Dose Route Frequency Provider Last Rate  Last Admin   acetaminophen (TYLENOL) tablet 650 mg  650 mg Oral Q6H PRN Evette Georges, NP       albuterol (VENTOLIN HFA) 108 (90 Base) MCG/ACT inhaler 2 puff  2 puff Inhalation Q4H PRN France Ravens, MD       alum & mag hydroxide-simeth (MAALOX/MYLANTA) 200-200-20 MG/5ML suspension 30 mL  30 mL Oral Q4H PRN Evette Georges, NP       clonazePAM Bobbye Charleston) tablet 0.5 mg  0.5 mg Oral BID France Ravens, MD   0.5 mg at 08/11/22 0955   lamoTRIgine (LAMICTAL) tablet 150 mg  150 mg Oral Daily France Ravens, MD   150 mg at 08/11/22 0955   magnesium hydroxide (MILK OF MAGNESIA) suspension 30 mL  30 mL Oral Daily PRN Evette Georges, NP       NIFEdipine (PROCARDIA-XL/NIFEDICAL-XL) 24 hr tablet 90 mg  90 mg Oral QHS France Ravens, MD       ondansetron (ZOFRAN-ODT) disintegrating tablet 8 mg  8 mg Oral Q8H PRN  Evette Georges, NP       QUEtiapine (SEROQUEL) tablet 100 mg  100 mg Oral QHS France Ravens, MD       traZODone (DESYREL) tablet 100 mg  100 mg Oral QHS PRN Evette Georges, NP   100 mg at 08/10/22 2306   Current Outpatient Medications  Medication Sig Dispense Refill   albuterol (VENTOLIN HFA) 108 (90 Base) MCG/ACT inhaler Inhale 2 puffs into the lungs every 4 (four) hours as needed for wheezing or shortness of breath (cough, shortness of breath or wheezing.). (Patient taking differently: Inhale 2 puffs into the lungs every 4 (four) hours as needed for wheezing or shortness of breath.) 6.7 g 3   NIFEdipine (PROCARDIA XL/NIFEDICAL-XL) 90 MG 24 hr tablet Take 90 mg by mouth at bedtime.     clonazePAM (KLONOPIN) 0.5 MG tablet Take 1 tablet (0.5 mg total) by mouth 2 (two) times daily. 30 tablet 0   [START ON 08/12/2022] lamoTRIgine (LAMICTAL) 150 MG tablet Take 1 tablet (150 mg total) by mouth daily.     QUEtiapine (SEROQUEL) 100 MG tablet Take 1 tablet (100 mg total) by mouth at bedtime.     traZODone (DESYREL) 100 MG tablet Take 1 tablet (100 mg total) by mouth at bedtime as needed.      PTA Medications: (Not in a hospital admission)      08/10/2022    7:45 PM  Depression screen PHQ 2/9  Decreased Interest 3  Down, Depressed, Hopeless 3  PHQ - 2 Score 6  Altered sleeping 3  Tired, decreased energy 1  Change in appetite 3  Feeling bad or failure about yourself  1  Trouble concentrating 2  Moving slowly or fidgety/restless 2  Suicidal thoughts 2  PHQ-9 Score 20  Difficult doing work/chores Very difficult    Flowsheet Row ED from 08/10/2022 in [facility] Most recent reading at 08/11/2022  7:44 AM ED from 08/10/2022 in [facility] Most recent reading at 08/10/2022  7:46 PM  C-SSRS RISK CATEGORY High Risk Error: Q7 should not be populated when Q6 is No       Musculoskeletal  Strength & Muscle Tone: within normal limits Gait &  Station: normal Patient leans: N/A  Psychiatric Specialty Exam  Presentation  General Appearance:  Casual  Eye Contact: Minimal  Speech: Clear and Coherent; Normal Rate  Speech Volume: Normal  Handedness: Right   Mood and Affect  Mood: Anxious; Depressed; Hopeless  Affect: Flat   Thought Process  Thought Processes: Linear  Descriptions of Associations:Circumstantial  Orientation:Full (Time, Place and Person)  Thought Content:Perseveration; Rumination  Diagnosis of Schizophrenia or Schizoaffective disorder in past: No    Hallucinations:Hallucinations: None  Ideas of Reference:None  Suicidal Thoughts:Suicidal Thoughts: Yes, Passive SI Active Intent and/or Plan: With Plan SI Passive Intent and/or Plan: Without Intent; With Plan; With Means to Carry Out  Homicidal Thoughts:Homicidal Thoughts: Yes, Passive HI Passive Intent and/or Plan: Without Intent; With Plan; With Means to [place]  Memory: Immediate Fair  Judgment: Impaired  Insight: Fair   Materials engineer: Fair  Attention Span: Fair  Recall: Smiley Houseman of Knowledge: Fair  Language: Fair   Psychomotor Activity  Psychomotor Activity: Psychomotor Activity: Increased   Assets  Assets: Communication Skills; Desire for Improvement; Housing; Intimacy; Leisure Time; Social Support   Sleep  Sleep: Sleep: Poor Number of Hours of Sleep: 5   Nutritional Assessment (For OBS and FBC admissions only) Has the patient had a weight loss or gain of 10 pounds or more in the last 3 months?: No Has the patient had a decrease in food intake/or appetite?: No Does the patient have dental problems?: No Does the patient have eating habits or behaviors that may be indicators of an eating disorder including binging or inducing vomiting?: No Has the patient recently lost weight without trying?: 0 Has the patient been eating poorly because of a decreased  appetite?: 0 Malnutrition Screening Tool Score: 0    Physical Exam  Physical Exam Constitutional:      Appearance: She is obese.  Cardiovascular:     Rate and Rhythm: Normal rate.  Pulmonary:     Effort: Pulmonary effort is normal.     Breath sounds: Normal breath sounds.  Musculoskeletal:        General: Normal range of motion.     Cervical back: Normal range of motion.  Skin:    General: Skin is warm and dry.  Neurological:     Mental Status: She is alert.    Review of Systems  Respiratory:  Negative for shortness of breath.   Cardiovascular:  Negative for chest pain.  Gastrointestinal:  Negative for abdominal pain, constipation, diarrhea, heartburn, nausea and vomiting.  Neurological:  Negative for headaches.   Blood pressure (!) 138/96, pulse 97, temperature 98 F (36.7 C), temperature source Oral, resp. rate 18, SpO2 97 %. There is no height or weight on file to calculate BMI.  Demographic Factors:  Caucasian  Loss Factors: NA  Historical Factors: Family history of suicide and Family history of mental illness or substance abuse  Risk Reduction Factors:   Responsible for children under 45 years of age, Living with another person, especially a relative, Positive social support, and Positive coping skills or problem solving skills  Continued Clinical Symptoms:  Severe Anxiety and/or Agitation More than one psychiatric diagnosis Previous Psychiatric Diagnoses and Treatments Medical Diagnoses and Treatments/Surgeries  Cognitive Features That Contribute To Risk:  None    Suicide Risk:  Moderate:  Frequent suicidal ideation with limited intensity, and duration, some specificity in terms of plans, no associated intent, good self-control, limited dysphoria/symptomatology, some risk factors present, and identifiable protective factors, including available and accessible social support.  Plan Of Care/Follow-up recommendations:  -Voluntary admission to [facility] for  psychiatric stabilization -Decrease Trazodone to 100 mg qhs prn -Switched ativan to klonopin 0.5 mg bid for anxiety and mood lability -Continue lamictal 150 mg daily  for bipolar disorder -Switch from zyprexa to seroquel 100 mg qhs for bipolar disorder -Follow up low grade fever and Urine culture  Disposition: transfer to [facility]  [name], MD 08/11/2022, 1:01 PM

## 2022-08-11 NOTE — Progress Notes (Signed)
Pt was accept to [facility] TODAY 08/11/22, bed placement 401-2. Voluntary consent can be faxed to [phone].  Pt meets inpatient criteria per [name], MD.  Attending Physician will be [name], MD.   Report can be called to: - Adult unit: [phone]  Pt can arrive after 1400.  Care Team Notified: [facility] Rona Ravens, RN, Staci Acosta, LCSWA, Tyrell Antonio, RN, and [name], MD  [place], [place]  08/11/2022 10:34 AM

## 2022-08-11 NOTE — ED Notes (Signed)
[facility] called for transport to [facility]. 

## 2022-08-11 NOTE — ED Notes (Signed)
Pt is in the bed resting. Respirations are even and unlabored. No acute distress noted. Will continue to monitor for safety 

## 2022-08-11 NOTE — Tx Team (Signed)
Initial Treatment Plan 08/11/2022 6:03 PM Kara Stephens Kara Stephens    PATIENT STRESSORS: Financial difficulties   Health problems     PATIENT STRENGTHS: Supportive family/friends    PATIENT IDENTIFIED PROBLEMS: Anxiety  Nervousness   Self harm  Worrying               DISCHARGE CRITERIA:  Reduction of life-threatening or endangering symptoms to within safe limits Safe-care adequate arrangements made  PRELIMINARY DISCHARGE PLAN: Participate in family therapy Return to previous living arrangement  PATIENT/FAMILY INVOLVEMENT: This treatment plan has been presented to and reviewed with the patient, Kara Stephens, The patient has been given the opportunity to ask questions and make suggestions.  Melvenia Needles, RN 08/11/2022, 6:03 PM

## 2022-08-11 NOTE — ED Notes (Signed)
Meal received 

## 2022-08-11 NOTE — Consult Note (Signed)
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  Present times 3 months.  Had this over a year ago and post poned surgery due to pregnancy.  Wound type: infectious/inflammatory Pressure Injury POA: NA Drainage (amount, consistency, odor) moderate serosanguinous   Periwound:erythema and tenderness Dressing procedure/placement/frequency: Cleanse wound to sacrococcygeal area with Ns and pat dry apply aquacel Clinton Gallant) to wound bed (packing into dead space if possible).  Cover with silicone foam.  Change every other day.   Will not follow at this time.  Please re-consult if needed.  Mike Gip MSN, RN, FNP-BC CWON Wound, Ostomy, Continence Nurse Outpatient [facility] [phone] Pager [phone]

## 2022-08-11 NOTE — ED Notes (Signed)
pt states she is having thoughts of harming her 4 months son but scared of telling the provider because she does not want her baby to be taken from her.  NP Channing Mutters has been notified.

## 2022-08-11 NOTE — ED Notes (Signed)
Patient A&Ox4. Patient present to [facility] for suicidal ideation. Patient reports having hx of Bipolar d/o. Pt reports having SI and HI toward her 4 month son. Patient denies any physical complaints when asked. No acute distress noted. Support and encouragement provided. Routine safety checks conducted according to facility protocol. Encouraged patient to notify staff if thoughts of harm toward self or others arise. Patient verbalize understanding and agreement. Will continue to monitor for safety.

## 2022-08-11 NOTE — ED Notes (Signed)
Patient A&Ox4. Patient reports SI with a plan to jump from 2nd floor. Patient denies HI/AH. Patient endorses VH stating s"I see flashes before my eyes. Patient denies any physical complaints when asked. No acute distress noted. Support and encouragement provided. Routine safety checks conducted according to facility protocol. Encouraged patient to notify staff if thoughts of harm toward self or others arise. Patient verbalize understanding and agreement. Will continue to monitor for safety.

## 2022-08-11 NOTE — ED Notes (Signed)
[facility] lab contacted and urine culture added.

## 2022-08-11 NOTE — Discharge Instructions (Signed)
You are being transferred to [facility] for psychiatric stabilization.

## 2022-08-11 NOTE — Plan of Care (Signed)
  Problem: Activity: Goal: Interest or engagement in activities will improve Outcome: Progressing Goal: Sleeping patterns will improve Outcome: Progressing   Problem: Education: Goal: Knowledge of [place] [facility] Education information/materials will improve Outcome: Progressing Goal: Emotional status will improve Outcome: Progressing Goal: Mental status will improve Outcome: Progressing Goal: Verbalization of understanding the information provided will improve Outcome: Progressing   

## 2022-08-11 NOTE — ED Notes (Signed)
Patient resting quietly in bed.  Respirations equal and unlabored, skin warm and dry, NAD. Routine safety checks conducted according to facility protocol. Will continue to monitor for safety.  

## 2022-08-11 NOTE — Progress Notes (Signed)
Admission note: Patient is a 36 years old female, who is voluntarily admitted to the adult unit for suicidal ideation with a plan to jump out of the balcony. According to the report received from [place], the ED nurse, Patient has a four months old and patient has been having thoughts to harm the baby. Patient has been having SI, hypomania, poor sleep, racing thoughts and psychomotor agitation.  Patient on arrival to the adult [facility] unit, is alert and oriented X's 4 and is able to answer admission questions. During Interview, patient presents with flat affect, and depressed mood. Patient denies AVH but endorses passive SI/HI, when asked by this writer what she was thinking about, patient states "what I'm thinking about is bad, I'm thinking about harming my son." Patient states she has a 72 years old, a 36 years old and 59 months old, she states her husband is very supportive of her. Patient states the thoughts of harming herself and her baby is stressing her out. Patients states she will like to work on insomnia and on her mental health.  Pt has orientation to unit, room and routine. Information packet given to patient and safety information discussed with her.  Admission INP armband ID verified with patient, and in place, fall risk assessment completed with Patient and she verbalized understanding of risks associated with falls. No contraband found during skin assessment, Skin, clean-dry-  with  some tattoo noted all over her body. Deep surgical wood noted on sacrum, states the wound was as result of a surgical removal of cysts on her sacrum. Patient is in no acute distress at this time, Q 15 minutes safety observation in place. Staff will continue to provide support and reassurance to patient.

## 2022-08-11 NOTE — ED Notes (Signed)
Patient discharge via ambulatory with a steady gait with Safe Transport staff member. Respirations equal and unlabored, skin warm and dry. No acute distress noted.  

## 2022-08-11 NOTE — Progress Notes (Signed)
Psychoeducational Group Note  Date:  08/11/2022 Time:  2108  Group Topic/Focus:  Wrap-Up Group:   The focus of this group is to help patients review their daily goal of treatment and discuss progress on daily workbooks.  Participation Level: Did Not Attend  Participation Quality:  Not Applicable  Affect:  Not Applicable  Cognitive:  Not Applicable  Insight:  Not Applicable  Engagement in Group: Not Applicable  Additional Comments:  The patient did not attend group this evening.   Hazle Coca S 08/11/2022, 9:08 PM

## 2022-08-12 ENCOUNTER — Encounter (HOSPITAL_COMMUNITY): Payer: Self-pay

## 2022-08-12 DIAGNOSIS — F3160 Bipolar disorder, current episode mixed, unspecified: Secondary | ICD-10-CM

## 2022-08-12 DIAGNOSIS — F419 Anxiety disorder, unspecified: Secondary | ICD-10-CM | POA: Diagnosis present

## 2022-08-12 DIAGNOSIS — I10 Essential (primary) hypertension: Secondary | ICD-10-CM | POA: Diagnosis present

## 2022-08-12 DIAGNOSIS — G4700 Insomnia, unspecified: Secondary | ICD-10-CM | POA: Diagnosis present

## 2022-08-12 DIAGNOSIS — F314 Bipolar disorder, current episode depressed, severe, without psychotic features: Secondary | ICD-10-CM | POA: Diagnosis present

## 2022-08-12 LAB — HEMOGLOBIN A1C
Hgb A1c MFr Bld: 5.8 % — ABNORMAL HIGH (ref 4.8–5.6)
Mean Plasma Glucose: 120 mg/dL

## 2022-08-12 LAB — URINE CULTURE: Culture: NO GROWTH

## 2022-08-12 LAB — CBC WITH DIFFERENTIAL/PLATELET
Abs Immature Granulocytes: 0.03 10*3/uL (ref 0.00–0.07)
Basophils Absolute: 0.1 10*3/uL (ref 0.0–0.1)
Basophils Relative: 1 %
Eosinophils Absolute: 0.1 10*3/uL (ref 0.0–0.5)
Eosinophils Relative: 1 %
HCT: 43.5 % (ref 36.0–46.0)
Hemoglobin: 14 g/dL (ref 12.0–15.0)
Immature Granulocytes: 0 %
Lymphocytes Relative: 25 %
Lymphs Abs: 2.6 10*3/uL (ref 0.7–4.0)
MCH: 27.8 pg (ref 26.0–34.0)
MCHC: 32.2 g/dL (ref 30.0–36.0)
MCV: 86.5 fL (ref 80.0–100.0)
Monocytes Absolute: 0.6 10*3/uL (ref 0.1–1.0)
Monocytes Relative: 6 %
Neutro Abs: 7.1 10*3/uL (ref 1.7–7.7)
Neutrophils Relative %: 67 %
Platelets: 322 10*3/uL (ref 150–400)
RBC: 5.03 MIL/uL (ref 3.87–5.11)
RDW: 14.7 % (ref 11.5–15.5)
WBC: 10.5 10*3/uL (ref 4.0–10.5)
nRBC: 0 % (ref 0.0–0.2)

## 2022-08-12 LAB — LIPID PANEL
Cholesterol: 202 mg/dL — ABNORMAL HIGH (ref 0–200)
HDL: 50 mg/dL (ref >40)
LDL Cholesterol: 121 mg/dL — ABNORMAL HIGH (ref 0–99)
Total CHOL/HDL Ratio: 4 RATIO
Triglycerides: 154 mg/dL — ABNORMAL HIGH (ref <150)
VLDL: 31 mg/dL (ref 0–40)

## 2022-08-12 MED ORDER — QUETIAPINE FUMARATE 300 MG PO TABS
300.0000 mg | ORAL_TABLET | Freq: Every day | ORAL | Status: DC
  Administered 2022-08-12 – 2022-08-16 (×5): 300 mg via ORAL
  Filled 2022-08-12 (×8): qty 1

## 2022-08-12 MED ORDER — PROPRANOLOL HCL 10 MG PO TABS
10.0000 mg | ORAL_TABLET | Freq: Three times a day (TID) | ORAL | Status: DC
  Administered 2022-08-12 – 2022-08-17 (×13): 10 mg via ORAL
  Filled 2022-08-12 (×23): qty 1

## 2022-08-12 MED ORDER — LORAZEPAM 2 MG/ML IJ SOLN
2.0000 mg | Freq: Once | INTRAMUSCULAR | Status: AC
  Administered 2022-08-12: 2 mg via INTRAMUSCULAR
  Filled 2022-08-12: qty 1

## 2022-08-12 MED ORDER — LORAZEPAM 1 MG PO TABS
1.0000 mg | ORAL_TABLET | Freq: Four times a day (QID) | ORAL | Status: DC
  Administered 2022-08-12 (×2): 1 mg via ORAL
  Filled 2022-08-12 (×2): qty 1

## 2022-08-12 MED ORDER — CLONAZEPAM 0.5 MG PO TABS: 1.0000 mg | ORAL_TABLET | Freq: Two times a day (BID) | ORAL | Status: DC

## 2022-08-12 MED ORDER — QUETIAPINE FUMARATE 100 MG PO TABS
100.0000 mg | ORAL_TABLET | Freq: Every evening | ORAL | Status: DC | PRN
  Administered 2022-08-13: 100 mg via ORAL
  Filled 2022-08-12: qty 1

## 2022-08-12 NOTE — BH IP Treatment Plan (Signed)
Interdisciplinary Treatment and Diagnostic Plan Update  08/12/2022 Time of Session: 9:45am  Kara Stephens MRN: [id number]  Principal Diagnosis: Bipolar 1 disorder, mixed ([place])  Secondary Diagnoses: Principal Problem:   Bipolar 1 disorder, mixed ([place])   Current Medications:  Current Facility-Administered Medications  Medication Dose Route Frequency Provider Last Rate Last Admin   acetaminophen (TYLENOL) tablet 650 mg  650 mg Oral Q6H PRN France Ravens, MD       albuterol (VENTOLIN HFA) 108 (90 Base) MCG/ACT inhaler 2 puff  2 puff Inhalation Q4H PRN France Ravens, MD       alum & mag hydroxide-simeth (MAALOX/MYLANTA) 200-200-20 MG/5ML suspension 30 mL  30 mL Oral Q4H PRN France Ravens, MD       clonazePAM Bobbye [place]) tablet 0.5 mg  0.5 mg Oral BID France Ravens, MD   0.5 mg at 08/12/22 0802   hydrOXYzine (ATARAX) tablet 25 mg  25 mg Oral TID PRN France Ravens, MD   25 mg at 08/12/22 0648   lamoTRIgine (LAMICTAL) tablet 150 mg  150 mg Oral Daily France Ravens, MD   150 mg at 08/12/22 0802   magnesium hydroxide (MILK OF MAGNESIA) suspension 30 mL  30 mL Oral Daily PRN France Ravens, MD       NIFEdipine (ADALAT CC) 24 hr tablet 90 mg  90 mg Oral Aliene Altes, MD   90 mg at 08/11/22 2144   QUEtiapine (SEROQUEL) tablet 100 mg  100 mg Oral Aliene Altes, MD   100 mg at 08/11/22 2117   traZODone (DESYREL) tablet 100 mg  100 mg Oral QHS PRN France Ravens, MD   100 mg at 08/11/22 2117   PTA Medications: Medications Prior to Admission  Medication Sig Dispense Refill Last Dose   albuterol (VENTOLIN HFA) 108 (90 Base) MCG/ACT inhaler Inhale 2 puffs into the lungs every 4 (four) hours as needed for wheezing or shortness of breath (cough, shortness of breath or wheezing.). (Patient taking differently: Inhale 2 puffs into the lungs every 4 (four) hours as needed for wheezing or shortness of breath.) 6.7 g 3    clonazePAM (KLONOPIN) 0.5 MG tablet Take 1 tablet (0.5 mg total) by mouth 2 (two) times daily. 30 tablet 0     lamoTRIgine (LAMICTAL) 150 MG tablet Take 1 tablet (150 mg total) by mouth daily.      NIFEdipine (PROCARDIA XL/NIFEDICAL-XL) 90 MG 24 hr tablet Take 90 mg by mouth at bedtime.      QUEtiapine (SEROQUEL) 100 MG tablet Take 1 tablet (100 mg total) by mouth at bedtime.      traZODone (DESYREL) 100 MG tablet Take 1 tablet (100 mg total) by mouth at bedtime as needed.       Patient Stressors: Financial difficulties   Health problems    Patient Strengths: Supportive family/friends   Treatment Modalities: Medication Management, Group therapy, Case management,  1 to 1 session with clinician, Psychoeducation, Recreational therapy.   Physician Treatment Plan for Primary Diagnosis: Bipolar 1 disorder, mixed ([place]) Long Term Goal(s):     Short Term Goals:    Medication Management: Evaluate patient's response, side effects, and tolerance of medication regimen.  Therapeutic Interventions: 1 to 1 sessions, Unit Group sessions and Medication administration.  Evaluation of Outcomes: Not Met  Physician Treatment Plan for Secondary Diagnosis: Principal Problem:   Bipolar 1 disorder, mixed ([place])  Long Term Goal(s):     Short Term Goals:       Medication Management: Evaluate patient's response, side effects,  and tolerance of medication regimen.  Therapeutic Interventions: 1 to 1 sessions, Unit Group sessions and Medication administration.  Evaluation of Outcomes: Not Met   RN Treatment Plan for Primary Diagnosis: Bipolar 1 disorder, mixed ([place]) Long Term Goal(s): Knowledge of disease and therapeutic regimen to maintain health will improve  Short Term Goals: Ability to remain free from injury will improve, Ability to participate in decision making will improve, Ability to verbalize feelings will improve, Ability to disclose and discuss suicidal ideas, and Ability to identify and develop effective coping behaviors will improve  Medication Management: RN will administer medications as ordered by  provider, will assess and evaluate patient's response and provide education to patient for prescribed medication. RN will report any adverse and/or side effects to prescribing provider.  Therapeutic Interventions: 1 on 1 counseling sessions, Psychoeducation, Medication administration, Evaluate responses to treatment, Monitor vital signs and CBGs as ordered, Perform/monitor CIWA, COWS, AIMS and Fall Risk screenings as ordered, Perform wound care treatments as ordered.  Evaluation of Outcomes: Not Met   LCSW Treatment Plan for Primary Diagnosis: Bipolar 1 disorder, mixed ([place]) Long Term Goal(s): Safe transition to appropriate next level of care at discharge, Engage patient in therapeutic group addressing interpersonal concerns.  Short Term Goals: Engage patient in aftercare planning with referrals and resources, Increase social support, Increase emotional regulation, Facilitate acceptance of mental health diagnosis and concerns, Identify triggers associated with mental health/substance abuse issues, and Increase skills for wellness and recovery  Therapeutic Interventions: Assess for all discharge needs, 1 to 1 time with Social worker, Explore available resources and support systems, Assess for adequacy in community support network, Educate family and significant other(s) on suicide prevention, Complete Psychosocial Assessment, Interpersonal group therapy.  Evaluation of Outcomes: Not Met   Progress in Treatment: Attending groups: Yes. Participating in groups: Yes. Taking medication as prescribed: Yes. Toleration medication: Yes. Family/Significant other contact made: Yes, individual(s) contacted:  If the patient provides consents Patient understands diagnosis: No. Discussing patient identified problems/goals with staff: Yes. Medical problems stabilized or resolved: Yes. Denies suicidal/homicidal ideation: Yes. Issues/concerns per patient self-inventory: No.   New problem(s) identified:  No, Describe:  None   New Short Term/Long Term Goal(s): medication stabilization, elimination of SI thoughts, development of comprehensive mental wellness plan.   Patient Goals: "To feel better"  Discharge Plan or Barriers: Patient recently admitted. CSW will continue to follow and assess for appropriate referrals and possible discharge planning.   Reason for Continuation of Hospitalization: Anxiety Depression Medical Issues Medication stabilization Suicidal ideation  Estimated Length of Stay: 3 to 7 days   Last [place] Suicide Severity Risk Score: [place] Admission (Current) from 08/11/2022 in [place] [facility] Most recent reading at 08/11/2022  5:43 PM ED from 08/10/2022 in [facility] Most recent reading at 08/11/2022  7:44 AM ED from 08/10/2022 in [facility] Most recent reading at 08/10/2022  7:46 PM  C-SSRS RISK CATEGORY Error: Q6 is Yes, you must answer 7 High Risk Error: Q7 should not be populated when Q6 is No       Last PHQ 2/9 Scores:    08/10/2022    7:45 PM  Depression screen PHQ 2/9  Decreased Interest 3  Down, Depressed, Hopeless 3  PHQ - 2 Score 6  Altered sleeping 3  Tired, decreased energy 1  Change in appetite 3  Feeling bad or failure about yourself  1  Trouble concentrating 2  Moving slowly or fidgety/restless  2  Suicidal thoughts 2  PHQ-9 Score 20  Difficult doing work/chores Very difficult    Scribe for Treatment Team: Darleen Crocker, Latanya Presser 08/12/2022 1:50 PM

## 2022-08-12 NOTE — Progress Notes (Addendum)
  Administered PRN Hydroxyzine per MAR per pt request. 

## 2022-08-12 NOTE — [facility] Suicide Risk Assessment (Signed)
Suicide Risk Assessment  Admission Assessment    [facility] Admission Suicide Risk Assessment   Nursing information obtained from:  Patient Demographic factors:  Caucasian Current Mental Status:  Suicidal ideation indicated by patient Loss Factors:  NA Historical Factors:  Family history of suicide Risk Reduction Factors:  Sense of responsibility to family  Total Time spent with patient: 1.5 hours Principal Problem: Bipolar 1 disorder, mixed ([place]) Diagnosis:  Active Problems:   Asthma   Bipolar I disorder, most recent episode depressed, severe without psychotic features ([place])   Anxiety   Insomnia   Essential hypertension  Reason For Admission: Kara Stephens is a 36 yo Caucasian female with a prior mental health history of bipolar d/o who presented to the [facility] health urgent care [facility]) accompanied by her husband on 12/4 with complaints of worsening depressive symptoms with a plan to jump off their [address] storey apartment building & intrusive thoughts to harm her 39 month old baby. Pt was transferred voluntarily to this [facility] for treatment and stabilization of her mood.   Continued Clinical Symptoms: suicidal ideations with plan, +HI towards 71 month old baby, worsening depressive symptoms in need of continuous hospitalization for treatment and stabilization.  Alcohol Use Disorder Identification Test Final Score (AUDIT): 0 The "Alcohol Use Disorders Identification Test", Guidelines for Use in Primary Care, Second Edition.  World Pharmacologist [facility]). Score between 0-7:  no or low risk or alcohol related problems. Score between 8-15:  moderate risk of alcohol related problems. Score between 16-19:  high risk of alcohol related problems. Score 20 or above:  warrants further diagnostic evaluation for alcohol dependence and treatment.  CLINICAL FACTORS:   Bipolar Disorder:   Depressive phase  Musculoskeletal: Strength & Muscle Tone: within normal limits Gait &  Station: normal Patient leans: N/A  Psychiatric Specialty Exam:  Presentation  General Appearance:  Disheveled  Eye Contact: Fair  Speech: Slow  Speech Volume: Normal  Handedness: Right   Mood and Affect  Mood: Anxious; Depressed  Affect: Congruent   Thought Process  Thought Processes: Coherent  Descriptions of Associations:Intact  Orientation:Full (Time, Place and Person)  Thought Content:Logical  History of Schizophrenia/Schizoaffective disorder:No  Duration of Psychotic Symptoms:No data recorded Hallucinations:Hallucinations: None  Ideas of Reference:None  Suicidal Thoughts:Suicidal Thoughts: Yes, Active SI Active Intent and/or Plan: Without Intent SI Passive Intent and/or Plan: Without Intent  Homicidal Thoughts:Homicidal Thoughts: Yes, Active HI Active Intent and/or Plan: Without Intent HI Passive Intent and/or Plan: Without Intent; With Plan; With Means to [place]  Memory: Immediate Good  Judgment: Fair  Insight: [place]   Community education officer  Concentration: Fair  Attention Span: Fair  Recall: Smiley Houseman of Knowledge: Fair  Language: Fair   Psychomotor Activity  Psychomotor Activity: Psychomotor Activity: Normal   Assets  Assets: Armed forces logistics/support/administrative officer; Housing; Social Support   Sleep  Sleep: Sleep: Poor    Physical Exam: Physical Exam Constitutional:      Appearance: Normal appearance.  HENT:     Head: Normocephalic.  Eyes:     Pupils: Pupils are equal, round, and reactive to light.  Cardiovascular:     Rate and Rhythm: Normal rate.  Pulmonary:     Effort: Pulmonary effort is normal.  Musculoskeletal:        General: Normal range of motion.  Neurological:     Mental Status: She is alert and oriented to person, place, and time.  Psychiatric:        Behavior:  Behavior normal.    Review of Systems  Constitutional:  Negative for fever.  HENT: Negative.    Eyes: Negative.    Respiratory: Negative.  Negative for cough.   Cardiovascular: Negative.  Negative for chest pain.  Gastrointestinal:  Negative for heartburn.  Genitourinary: Negative.   Musculoskeletal: Negative.   Skin: Negative.   Neurological: Negative.  Negative for dizziness.  Psychiatric/Behavioral:  Positive for depression and suicidal ideas. Negative for hallucinations, memory loss and substance abuse. The patient is nervous/anxious and has insomnia.    Blood pressure (!) 115/92, pulse 92, temperature (!) 97.4 F (36.3 C), temperature source Oral, resp. rate 18, height 5\' 5"  (1.651 m), weight 91.6 kg, SpO2 97 %. Body mass index is 33.61 kg/m.   COGNITIVE FEATURES THAT CONTRIBUTE TO RISK:  None    SUICIDE RISK:    Moderate:  Frequent suicidal ideation with limited intensity, and duration, some specificity in terms of plans, no associated intent, good self-control, limited dysphoria/symptomatology, some risk factors present, and identifiable protective factors, including available and accessible social support.   PLAN Safety and Monitoring: Voluntary admission to inpatient psychiatric unit for safety, stabilization and treatment Daily contact with patient to assess and evaluate symptoms and progress in treatment Patient's case to be discussed in multi-disciplinary team meeting Observation Level : q15 minute checks Vital signs: q12 hours Precautions: Safety   Long Term Goal(s): Improvement in symptoms so as ready for discharge   Short Term Goals: Ability to identify changes in lifestyle to reduce recurrence of condition will improve, Ability to verbalize feelings will improve, Ability to disclose and discuss suicidal ideas, Ability to demonstrate self-control will improve, Ability to identify and develop effective coping behaviors will improve, and Compliance with prescribed medications will improve   Diagnoses:  Active Problems:   Asthma   Bipolar I disorder, most recent episode  depressed, severe without psychotic features (H[facility])   Anxiety   Insomnia   Essential hypertension   Medications -Start Lamictal 150 mg daily for mood stabilization (home med) -Give Ativan 2 mg IM x 0ne dose for catatonia-completed with positive response  noted an hour after medication administration (smiling and talking more coherently) -Start Ativan 1 mhg QID for catatonia -Continue Inderal 10 mg Q 8 H for anxiety (home med) -Increase Seroquel to 300 mg nightly for mood stabilization (home med-was 25 mg QHS) -Start Seroquel 100 mg at bedtime PRN for sleep/anxiety -Continue Nifedipine 90 mg Q HS for hypertension (home med) -Continue Hydroxyzine 25 mg every 6 hours PRN -Continue Albuterol PRN Q 4 HJ for wheezing/SOB   Other PRNS -Continue Tylenol 650 mg every 6 hours PRN for mild pain -Continue Maalox 30 mg every 4 hrs PRN for indigestion -Continue Milk of Magnesia as needed every 6 hrs for constipation   Discharge Planning: Social work and case management to assist with discharge planning and identification of hospital follow-up needs prior to discharge Estimated LOS: 5-7 days Discharge Concerns: Need to establish a safety plan; Medication compliance and effectiveness Discharge Goals: Return home with outpatient referrals for mental health follow-up including medication management/psychotherapy  I certify that inpatient services furnished can reasonably be expected to improve the patient's condition.   , NP 08/12/2022, 5:37 PM

## 2022-08-12 NOTE — Plan of Care (Signed)
  Problem: Education: Goal: Emotional status will improve Outcome: Not Progressing Goal: Mental status will improve Outcome: Not Progressing   

## 2022-08-12 NOTE — H&P (Signed)
Psychiatric Admission Assessment Adult  Patient Identification: Kara Stephens MRN:  [id number] Date of Evaluation:  08/12/2022 Chief Complaint:  Bipolar 1 disorder, mixed ([place]) [F31.60] Principal Diagnosis: Bipolar 1 disorder, mixed ([place]) Diagnosis:  Active Problems:   Asthma   Bipolar I disorder, most recent episode depressed, severe without psychotic features ([place])   Anxiety   Insomnia   Essential hypertension  CC: +Suicidal ideations with plan & +HI towards 23 month old baby Reason For Admission: Kara Stephens is a 36 yo Caucasian female with a prior mental health history of bipolar d/o who presented to the [employer] health urgent care [facility]) accompanied by her husband on 12/4 with complaints of worsening depressive symptoms with a plan to jump off their 2nd storey apartment building & intrusive thoughts to harm her 46 month old baby. Pt was transferred voluntarily to this [facility] for treatment and stabilization of her mood.  Mode of transport to Hospital: Safe Transport Current Outpatient (Home) Medication List: Zyprexa, Lorazepam, Trazodone, Lamictal PRN medication prior to evaluation: Trazodone 50 mg PRN, Hydroxyzine 25 mg TID PRN, Tylenol 650 mg PRN, Milk of Mag PRN, Maalox PRN.   ED course: Uneventful Collateral Information: None POA/Legal Guardian: Own guardian  HPI: Pt reports that she had some hypomania shortly after giving to her baby 4 months ago. She reports that depressive symptoms started a month after having her child, and have progressively worsened over the past couple of months. She reports worsening anhedonia, insomnia, poor concentration levels, feelings of frustration as well as hopelessness, hopelessness, worthless & guilt about the intrusive thoughts of wanting to harm her child. She also reports decreased energy levels, racing thoughts, anxiety, poor appetite & restlessness which have also gradually worsened over the past couple of months.    Pt reports that she was taking Seroquel, Lamictal, Trazodone & Klonopin while pregnant, and her symptoms started after the Klonopin was discontinued after she had the baby, and replaced with Ativan, and Seroquel was discontinued and replaced with Zyprexa. She denies AVH, denies other delusional thinking, and denies paranoia. She also denies any history of any psychosis in the past.   Pt denies any obsessions or compulsions in the past or currently. She denies any traumas in the past, denies any PTSD or PTSD type symptoms in the past or currently. She reports some panic attacks in the past, but states that she has not had any of these most recently.  Past Psychiatric Hx: Previous Psych Diagnoses: Bipolar disorder type 1 diagnosed in 2016. Prior inpatient treatment: Yes. [facility] hospital in 2016, and [facility] in 2020 both for manic type symptoms.  Current/prior outpatient treatment: "Kara Stephens" with [facility].  Prior rehab hx: Denies  Psychotherapy hx: Denies, states she wants a therapist prior to discharge History of suicide attempts: Denies  History of homicide or aggression: Denies Psychiatric medication history: Abilify (states could not walk), Latuda (jaw clenched. Sertraline (caused mania)  Psychiatric medication compliance history: Compliant Neuromodulation history: Denies Current Psychiatrist: Kerby Stephens Current therapist: Denies having one currently   Substance Abuse Hx: Alcohol: Denies use Tobacco: Denies use Illicit drugs: Denies use Rx drug abuse: Denies use Rehab hx: Denies use  Past Medical History: Medical Diagnoses: Denies any  Home Rx: Denies any, but on Nifedipine 90 mg daily as per chart review. Prior Hosp: For the birth of her 3 children Prior Surgeries/Trauma: Denies  Head trauma, LOC, concussions, seizures: Denies  Allergies: Sulfa Antibiotics High  Anaphylaxis, Swelling   Tramadol  High  Other (See Comments) Manic  Cortisone  Medium  Other (See Comments) Pain  Abilify [aripiprazole] Not Specified  Other (See Comments) Psychosis, muscle spasms, drooling, agitiation   Latuda [lurasidone] Not Specified  Other (See Comments) Psychosis, muscle spasms, agitation, drooling, incontinence  Sertraline Not Specified  Other (See Comments) Mania  Clindamycin/lincomycin Low  Itching, Rash, Other (See Comments) Flushing   LMP: none for the past 2 months due to being on an IUD Contraception: Denies any PCP: Unable to recall name  Family History: Medical:Denies Psych:Father had bipolar d/o type 1 & MDD. He stabbed himself multiple times in a suicide attempt, and later died from sepsis from one of the wounds being infected. Pt reports that this happened 8 yrs ago. Psych Rx: Denies  SA/HA: Father as above Substance use family hx: Denies  Social History: Pt reports that she lives with her husband and 3 children (ages 34`19, 2013, and 304 months old). She reports that she currently does not work, but her husband works. She reports that she is dependent on her husband's income. She reports highest level of education as being "some college". She denies any history of emotional, physical or sexual abuse. She denies that taking care of her 194 month old child is stressful, denies that money is a current stressor in her household.  Current Presentation: Pt with flat affect and depressed mood, attention to personal hygiene and grooming is poor, she appears disheveled, & is malodorous, eye contact is fair, speech is clear & coherent. Thought contents are organized and logical, and pt currently endorses +suicidal ideations, still reports having thoughts of wanting to jump off the balcony at her home, verbally contracts for safety here at the hospital. She is continuing to report intrusive thoughts of wanting to haim her 544 month old baby, but states she does not want to do anything to hurt the baby and that is why she asked for help.  She denies AVH or  paranoia. There is no evidence of delusional thoughts.    Associated Signs/Symptoms: Depression Symptoms:  depressed mood, anhedonia, insomnia, fatigue, feelings of worthlessness/guilt, difficulty concentrating, hopelessness, recurrent thoughts of death, suicidal thoughts with specific plan, anxiety, loss of energy/fatigue, disturbed sleep, decreased appetite, (Hypo) Manic Symptoms:   n/a Anxiety Symptoms:  Excessive Worry, Psychotic Symptoms:   n/a PTSD Symptoms: NA Total Time spent with patient: 1.5 hours  Is the patient at risk to self? Yes.    Has the patient been a risk to self in the past 6 months? Yes.    Has the patient been a risk to self within the distant past? Yes.    Is the patient a risk to others? Yes.    Has the patient been a risk to others in the past 6 months? Yes.    Has the patient been a risk to others within the distant past? No.   [place] Scale:  Flowsheet Row [facility] (Current[facility] Most recent reading at 08/11/2022  5:43 PM [facility] Most recent reading at 08/11/2022  7:44 AM [facility] Most recent reading at 08/10/2022  7:46 PM  C-SSRS RISK CATEGORY Error: Q6 is Yes, you must answer 7 High Risk Error: Q7 should not be populated when Q6 is No       Prior Inpatient Therapy: Yes.   If yes, describe: See above  Prior Outpatient Therapy: Yes.  If yes, describe: See above   Alcohol Screening: 1. How often do you have a drink containing alcohol?: Never 2. How many drinks containing alcohol do you have on a typical day when you are drinking?: 1 or 2 3. How often do you have six or more drinks on one occasion?: Never AUDIT-C Score: 0 4. How often during the last year have you found that you were not able to stop drinking once you had started?: Never 5. How often during the last year have you  failed to do what was normally expected from you because of drinking?: Never 6. How often during the last year have you needed a first drink in the morning to get yourself going after a heavy drinking session?: Never 7. How often during the last year have you had a feeling of guilt of remorse after drinking?: Never 8. How often during the last year have you been unable to remember what happened the night before because you had been drinking?: Never 9. Have you or someone else been injured as a result of your drinking?: No 10. Has a relative or friend or a doctor or another health worker been concerned about your drinking or suggested you cut down?: No Alcohol Use Disorder Identification Test Final Score (AUDIT): 0 Alcohol Brief Interventions/Follow-up: Alcohol education/Brief advice Substance Abuse History in the last 12 months:  No. Consequences of Substance Abuse: NA Previous Psychotropic Medications: Yes  Psychological Evaluations: No  Past Medical History:  Past Medical History:  Diagnosis Date   Anxiety    Back spasm    Bipolar I disorder ([place])    Hypertension    Tachycardia     Past Surgical History:  Procedure Laterality Date   APPENDECTOMY     CHOLECYSTECTOMY     HAND SURGERY     SHOULDER SURGERY     Family History:  Family History  Problem Relation Age of Onset   Hypertension Mother    Hyperlipidemia Mother    Suicidality Father    Depression Father    Family Psychiatric  History: See above Tobacco Screening:  Social History   Tobacco Use  Smoking Status Former   Types: Cigarettes   Quit date: 09/10/2016   Years since quitting: 5.9  Smokeless Tobacco Never    BH Tobacco Counseling     Are you interested in Tobacco Cessation Medications?  No value filed. Counseled patient on smoking cessation:  N/A, patient does not use tobacco products Reason Tobacco Screening Not Completed: No value filed.       Social History:  Social History   Substance and Sexual  Activity  Alcohol Use No     Social History   Substance and Sexual Activity  Drug Use No    Additional Social History:  Allergies:   Allergies  Allergen Reactions   Sulfa Antibiotics Anaphylaxis and Swelling   Tramadol Other (See Comments)    Manic   Cortisone Other (See Comments)    Pain   Abilify [Aripiprazole] Other (See Comments)    Psychosis, muscle spasms, drooling, agitiation     Latuda [Lurasidone] Other (See Comments)    Psychosis, muscle spasms, agitation, drooling, incontinence     Sertraline Other (See Comments)    Mania   Clindamycin/Lincomycin Itching, Rash and Other (See Comments)    Flushing   Lab Results:  Results for orders placed or performed during the hospital encounter of 08/11/22 (from the past 48 hour(s))  Lipid panel     Status: Abnormal  Collection Time: 08/12/22  6:45 AM  Result Value Ref Range   Cholesterol 202 (H) 0 - 200 mg/dL   Triglycerides 154 (H) <150 mg/dL   HDL 50 >40 mg/dL   Total CHOL/HDL Ratio 4.0 RATIO   VLDL 31 0 - 40 mg/dL   LDL Cholesterol 121 (H) 0 - 99 mg/dL    Comment:        Total Cholesterol/HDL:CHD Risk Coronary Heart Disease Risk Table                     Men   Women  1/2 Average Risk   3.4   3.3  Average Risk       5.0   4.4  2 X Average Risk   9.6   7.1  3 X Average Risk  23.4   11.0        Use the calculated Patient Ratio above and the CHD Risk Table to determine the patient's CHD Risk.        ATP III CLASSIFICATION (LDL):  <100     mg/dL   Optimal  100-129  mg/dL   Near or Above                    Optimal  130-159  mg/dL   Borderline  160-189  mg/dL   High  >190     mg/dL   Very High Performed at [facility] [address] [place], [place] [address]   CBC with Differential/Platelet     Status: None   Collection Time: 08/12/22  6:45 AM  Result Value Ref Range   WBC 10.5 4.0 - 10.5 K/uL   RBC 5.03 3.87 - 5.11 MIL/uL   Hemoglobin 14.0 12.0 - 15.0 g/dL   HCT 43.5 36.0 - 46.0 %    MCV 86.5 80.0 - 100.0 fL   MCH 27.8 26.0 - 34.0 pg   MCHC 32.2 30.0 - 36.0 g/dL   RDW 14.7 11.5 - 15.5 %   Platelets 322 150 - 400 K/uL   nRBC 0.0 0.0 - 0.2 %   Neutrophils Relative % 67 %   Neutro Abs 7.1 1.7 - 7.7 K/uL   Lymphocytes Relative 25 %   Lymphs Abs 2.6 0.7 - 4.0 K/uL   Monocytes Relative 6 %   Monocytes Absolute 0.6 0.1 - 1.0 K/uL   Eosinophils Relative 1 %   Eosinophils Absolute 0.1 0.0 - 0.5 K/uL   Basophils Relative 1 %   Basophils Absolute 0.1 0.0 - 0.1 K/uL   Immature Granulocytes 0 %   Abs Immature Granulocytes 0.03 0.00 - 0.07 K/uL    Comment: Performed at [facility], [place] [address], [place], [place] [address]    Blood Alcohol level:  Lab Results  Component Value Date   ETH <10 [id number]    Metabolic Disorder Labs:  Lab Results  Component Value Date   HGBA1C 5.8 (H) 08/10/2022   MPG 120 08/10/2022   No results found for: "PROLACTIN" Lab Results  Component Value Date   CHOL 202 (H) 08/12/2022   TRIG 154 (H) 08/12/2022   HDL 50 08/12/2022   CHOLHDL 4.0 08/12/2022   VLDL 31 08/12/2022   LDLCALC 121 (H) 08/12/2022    Current Medications: Current Facility-Administered Medications  Medication Dose Route Frequency Provider Last Rate Last Admin   acetaminophen (TYLENOL) tablet 650 mg  650 mg Oral Q6H PRN France Ravens, MD       albuterol (  VENTOLIN HFA) 108 (90 Base) MCG/ACT inhaler 2 puff  2 puff Inhalation Q4H PRN France Ravens, MD       alum & mag hydroxide-simeth (MAALOX/MYLANTA) 200-200-20 MG/5ML suspension 30 mL  30 mL Oral Q4H PRN France Ravens, MD       hydrOXYzine (ATARAX) tablet 25 mg  25 mg Oral TID PRN France Ravens, MD   25 mg at 08/12/22 0648   lamoTRIgine (LAMICTAL) tablet 150 mg  150 mg Oral Daily France Ravens, MD   150 mg at 08/12/22 0802   LORazepam (ATIVAN) tablet 1 mg  1 mg Oral QID Massengill, Ovid Curd, MD       magnesium hydroxide (MILK OF MAGNESIA) suspension 30 mL  30 mL Oral Daily PRN France Ravens, MD       NIFEdipine  (ADALAT CC) 24 hr tablet 90 mg  90 mg Oral Aliene Altes, MD   90 mg at 08/11/22 2144   propranolol (INDERAL) tablet 10 mg  10 mg Oral Q8H Massengill, Ovid Curd, MD   10 mg at 08/12/22 1458   QUEtiapine (SEROQUEL) tablet 100 mg  100 mg Oral QHS PRN Massengill, Ovid Curd, MD       QUEtiapine (SEROQUEL) tablet 300 mg  300 mg Oral QHS Massengill, Ovid Curd, MD       PTA Medications: Medications Prior to Admission  Medication Sig Dispense Refill Last Dose   albuterol (VENTOLIN HFA) 108 (90 Base) MCG/ACT inhaler Inhale 2 puffs into the lungs every 4 (four) hours as needed for wheezing or shortness of breath (cough, shortness of breath or wheezing.). (Patient taking differently: Inhale 2 puffs into the lungs every 4 (four) hours as needed for wheezing or shortness of breath.) 6.7 g 3    clonazePAM (KLONOPIN) 0.5 MG tablet Take 1 tablet (0.5 mg total) by mouth 2 (two) times daily. 30 tablet 0    lamoTRIgine (LAMICTAL) 150 MG tablet Take 1 tablet (150 mg total) by mouth daily.      NIFEdipine (PROCARDIA XL/NIFEDICAL-XL) 90 MG 24 hr tablet Take 90 mg by mouth at bedtime.      QUEtiapine (SEROQUEL) 100 MG tablet Take 1 tablet (100 mg total) by mouth at bedtime.      traZODone (DESYREL) 100 MG tablet Take 1 tablet (100 mg total) by mouth at bedtime as needed.      Musculoskeletal: Strength & Muscle Tone: within normal limits Gait & Station: normal Patient leans: N/A Psychiatric Specialty Exam:  Presentation  General Appearance:  Disheveled  Eye Contact: Fair  Speech: Slow  Speech Volume: Normal  Handedness: Right   Mood and Affect  Mood: Anxious; Depressed  Affect: Congruent   Thought Process  Thought Processes: Coherent  Duration of Psychotic Symptoms:N/A Past Diagnosis of Schizophrenia or Psychoactive disorder: No  Descriptions of Associations:Intact  Orientation:Full (Time, Place and Person)  Thought Content:Logical  Hallucinations:Hallucinations: None  Ideas of  Reference:None  Suicidal Thoughts:Suicidal Thoughts: Yes, Active SI Active Intent and/or Plan: Without Intent SI Passive Intent and/or Plan: Without Intent  Homicidal Thoughts:Homicidal Thoughts: Yes, Active HI Active Intent and/or Plan: Without Intent HI Passive Intent and/or Plan: Without Intent; With Plan; With Means to [place]  Memory: Immediate Good  Judgment: Fair  Insight: Fair   Community education officer  Concentration: Fair  Attention Span: Fair  Recall: Smiley Houseman of Knowledge: Fair  Language: Fair  Psychomotor Activity  Psychomotor Activity: Psychomotor Activity: Normal   Assets  Assets: Armed forces logistics/support/administrative officer; Housing; Social Support  Sleep  Sleep: Sleep:  Poor  Physical Exam: Physical Exam Constitutional:      Appearance: Normal appearance.  HENT:     Head: Normocephalic.     Nose: Nose normal. No congestion.  Pulmonary:     Effort: Pulmonary effort is normal.  Musculoskeletal:        General: Normal range of motion.     Cervical back: Normal range of motion.  Neurological:     Mental Status: She is alert and oriented to person, place, and time.    Review of Systems  Constitutional:  Negative for fever.  HENT: Negative.    Eyes: Negative.   Respiratory: Negative.    Cardiovascular: Negative.   Gastrointestinal: Negative.   Genitourinary: Negative.   Musculoskeletal: Negative.   Skin: Negative.   Neurological: Negative.   Psychiatric/Behavioral:  Positive for depression, hallucinations and suicidal ideas. Negative for memory loss and substance abuse. The patient is nervous/anxious and has insomnia.    Blood pressure (!) 115/92, pulse 92, temperature (!) 97.4 F (36.3 C), temperature source Oral, resp. rate 18, height 5\' 5"  (1.651 m), weight 91.6 kg, SpO2 97 %. Body mass index is 33.61 kg/m.  Treatment Plan Summary: Daily contact with patient to assess and evaluate symptoms and progress in treatment and Medication  management  Observation Level/Precautions:  15 minute checks  Laboratory:  Labs reviewed   Psychotherapy:  Unit Group sessions  Medications:  See [facility]  Consultations:  To be determined   Discharge Concerns:  Safety, medication compliance, mood stability  Estimated LOS: 5-7 days  Other:  N/A   Labs Reviewed independently on 08/12/2022:  CMP WNL, Lipid panel reviewed (cholesterol 202, LDL-121, Triglycerides 154-educated on healthy food choices and exercise. HA1C is 5.8 rendering pt a prediabetic-will need PCP f/u after dc. TSH WNL. UA hazy with rare bacteria otherwise WNL. EKG from 12/04 with QTC-445.   PLAN Safety and Monitoring: Voluntary admission to inpatient psychiatric unit for safety, stabilization and treatment Daily contact with patient to assess and evaluate symptoms and progress in treatment Patient's case to be discussed in multi-disciplinary team meeting Observation Level : q15 minute checks Vital signs: q12 hours Precautions: Safety  Long Term Goal(s): Improvement in symptoms so as ready for discharge  Short Term Goals: Ability to identify changes in lifestyle to reduce recurrence of condition will improve, Ability to verbalize feelings will improve, Ability to disclose and discuss suicidal ideas, Ability to demonstrate self-control will improve, Ability to identify and develop effective coping behaviors will improve, and Compliance with prescribed medications will improve  Diagnoses:  Active Problems:   Asthma   Bipolar I disorder, most recent episode depressed, severe without psychotic features (HCC)   Anxiety   Insomnia   Essential hypertension  Medications -Start Lamictal 150 mg daily for mood stabilization (home med) -Give Ativan 2 mg IM x 0ne dose for catatonia-completed with positive response  noted an hour after medication administration (smiling and talking more coherently) -Start Ativan 1 mhg QID for catatonia -Continue Inderal 10 mg Q 8 H for anxiety (home  med) -Increase Seroquel to 300 mg nightly for mood stabilization (home med-was 25 mg QHS) -Start Seroquel 100 mg at bedtime PRN for sleep/anxiety -Continue Nifedipine 90 mg Q HS for hypertension (home med) -Continue Hydroxyzine 25 mg every 6 hours PRN -Continue Albuterol PRN Q 4 HJ for wheezing/SOB  Other PRNS -Continue Tylenol 650 mg every 6 hours PRN for mild pain -Continue Maalox 30 mg every 4 hrs PRN for indigestion -Continue Milk of Magnesia as needed  every 6 hrs for constipation  Discharge Planning: Social work and case management to assist with discharge planning and identification of hospital follow-up needs prior to discharge Estimated LOS: 5-7 days Discharge Concerns: Need to establish a safety plan; Medication compliance and effectiveness Discharge Goals: Return home with outpatient referrals for mental health follow-up including medication management/psychotherapy  I certify that inpatient services furnished can reasonably be expected to improve the patient's condition.    Starleen Blue, NP 12/6/20235:14 PM

## 2022-08-12 NOTE — Group Note (Signed)
Recreation Therapy Group Note   Group Topic:Team Building  Group Date: 08/12/2022 Start Time: 0935 End Time: 1000 Facilitators: Rondrick Barreira-McCall, LRT,CTRS Location: [address] Dayroom   Goal Area(s) Addresses:  Patient will effectively work with peer towards shared goal.  Patient will identify skills used to make activity successful.  Patient will identify how skills used during activity can be applied to reach post d/c goals.    Group Description: [employer]. In teams of 5-6, patients were given 11 craft pipe cleaners. Using the materials provided, patients were instructed to compete again the opposing team(s) to build the tallest free-standing structure from floor level. The activity was timed; difficulty increased by Clinical research associate as Production designer, theatre/television/film continued.  Systematically resources were removed with additional directions for example, placing one arm behind their back, working in silence, and shape stipulations. LRT facilitated post-activity discussion reviewing team processes and necessary communication skills involved in completion. Patients were encouraged to reflect how the skills utilized, or not utilized, in this activity can be incorporated to positively impact support systems post discharge.   Affect/Mood: Appropriate   Participation Level: Engaged   Participation Quality: Independent   Behavior: Appropriate   Speech/Thought Process: Focused   Insight: Good   Judgement: Good   Modes of Intervention: STEM Activity   Patient Response to Interventions:  Engaged   Education Outcome:  Acknowledges education and In group clarification offered    Clinical Observations/Individualized Feedback: Pt attended and participated in group session.    Plan: Continue to engage patient in RT group sessions 2-3x/week.   Roosevelt Eimers-McCall, LRT,CTRS 08/12/2022 11:13 AM

## 2022-08-12 NOTE — Progress Notes (Addendum)
   On assessment, patient is alert and oriented.  Pt is being assessed at bedside.  Pt reports high anxiety and moderate depression.  Pt denies SI/HI/AVH.  Patient reports intrusive thoughts of taking her baby son outside and throwing him down."  Patient shares she has a 36 y.o. and 80 y.o. daughter and a supportive spouse.  Patient admits to previous post partem depression, but shares this has lasted longer than prior.  Vital signs obtained.  Medications administered.  PRN Trazodone administered per [facility] per pt request.  Patient is safe on the unit with Q 15 minute safety checks.   08/11/22 2144  Psych Admission Type (Psych Patients Only)  Admission Status Voluntary  Psychosocial Assessment  Patient Complaints Anxiety;Hopelessness;Insomnia;Sadness;Self-harm thoughts;Worrying  Eye Contact Fair  Facial Expression Flat  Affect Depressed;Anxious  Speech Logical/coherent  Interaction Cautious  Motor Activity Other (Comment) (WDL)  Appearance/Hygiene In hospital gown  Behavior Characteristics Cooperative;Appropriate to situation  Mood Depressed  Thought Process  Coherency Circumstantial  Content Blaming self  Delusions None reported or observed  Perception WDL  Hallucination None reported or observed  Judgment Impaired  Confusion None  Danger to Self  Current suicidal ideation? Denies  Agreement Not to Harm Self Yes  Description of Agreement verbal  Danger to Others  Danger to Others None reported or observed

## 2022-08-12 NOTE — Progress Notes (Signed)
Chaplain met with Kara Stephens who was requesting prayer so that she would not act on any of her intrusive thoughts of harming her baby.  She is scared by the thoughts and has no intention or desire to hurt her son.  She reported having a great pregnancy and feeling happy, but shared that after what was a difficult delivery, she became hypomanic and couldn't take her medication that was helping during pregnancy because she was breastfeeding.  She has since stopped breast feeding and is open to trying whatever medications will help her no longer have the intrusive thoughts.  She has a 36 year old and 36 year old at home as well and had some similar issues after delivering her first baby.  Chaplain affirmed her decision to get help and affirmed that caring for herself was the best thing she could do for her baby.  Chaplain provided spiritual support through listening and prayer.  [address], [place] Pager, [phone]

## 2022-08-12 NOTE — Progress Notes (Addendum)
D: Pt reported passive SI this morning. When asked about SI, pt states " I am having racing thoughts about self harm but I promise not to act on these thoughts while I am here". Pt reports HI in the form of hurting her baby. Pt denies AVH. Pt rated her depression a 10/10, anxiety a 10/10, and feelings of hopelessness a 10/10. Pt presents with slow motor movements and responses. Pt has been calm, and cooperative throughout the shift.   A: RN provided support and encouragement to patient. Pt given scheduled medications as prescribed. PRN Hydroxyzine given for anxiety. RN completed wound care on patient's sacral wound as ordered. Q15 min checks verified for safety.    R: Patient verbally contracts for safety. Patient compliant with medications and treatment plan. Pt is safe on the unit.   08/12/22 1212  Psych Admission Type (Psych Patients Only)  Admission Status Voluntary  Psychosocial Assessment  Patient Complaints Anxiety;Depression;Worrying  Eye Contact Fair  Facial Expression Flat;Sad  Affect Anxious;Depressed  Speech Logical/coherent  Interaction Cautious  Motor Activity Slow (Pt moves very slowly and presents with catatonic- like features)  Appearance/Hygiene Unremarkable  Behavior Characteristics Cooperative  Mood Depressed;Anxious  Thought Process  Coherency Circumstantial  Content Blaming self  Delusions None reported or observed  Perception WDL  Hallucination None reported or observed  Judgment Impaired  Confusion None  Danger to Self  Current suicidal ideation? Passive (Pt states "I have racing thoughts of self harm but I have no intention of acting on them while I am here")  Self-Injurious Behavior Some self-injurious ideation observed or expressed.  No lethal plan expressed   Agreement Not to Harm Self Yes  Description of Agreement Pt verbally contracts for safety  Danger to Others  Danger to Others None reported or observed

## 2022-08-12 NOTE — Progress Notes (Signed)
  Wound Care order placed at 2313 on 08/11/22.  Patient is asleep.

## 2022-08-13 DIAGNOSIS — F3160 Bipolar disorder, current episode mixed, unspecified: Secondary | ICD-10-CM | POA: Diagnosis not present

## 2022-08-13 MED ORDER — SODIUM CHLORIDE 0.9 % IN NEBU
INHALATION_SOLUTION | RESPIRATORY_TRACT | Status: AC
  Administered 2022-08-13: 1 mL
  Filled 2022-08-13: qty 9

## 2022-08-13 MED ORDER — LORAZEPAM 1 MG PO TABS: 1.0000 mg | ORAL_TABLET | Freq: Four times a day (QID) | ORAL | Status: DC | PRN

## 2022-08-13 MED ORDER — LORAZEPAM 1 MG PO TABS
1.0000 mg | ORAL_TABLET | Freq: Four times a day (QID) | ORAL | Status: DC
  Administered 2022-08-13 – 2022-08-14 (×5): 1 mg via ORAL
  Filled 2022-08-13 (×5): qty 1

## 2022-08-13 MED ORDER — DOCUSATE SODIUM 100 MG PO CAPS
100.0000 mg | ORAL_CAPSULE | Freq: Two times a day (BID) | ORAL | Status: DC
  Administered 2022-08-13 – 2022-09-04 (×43): 100 mg via ORAL
  Filled 2022-08-13 (×49): qty 1

## 2022-08-13 NOTE — Progress Notes (Signed)
[facility]  08/13/2022 4:01 PM Kara Stephens  MRN:  [id number] Principal Problem: Bipolar 1 disorder, mixed (HCC) Diagnosis: Active Problems:   Asthma   Bipolar I disorder, most recent episode depressed, severe without psychotic features (HCC)   Anxiety   Insomnia   Essential hypertension  Reason For Admission: Kara Stephens is a 36 yo Caucasian female with a prior mental health history of bipolar d/o who [place] [facility] [place] [facility] accompanied by her husband on 12/4 with complaints of worsening depressive symptoms with a plan to jump off their [address] storey apartment building & intrusive thoughts to harm her 524 month old baby. Pt [facility] for treatment and stabilization of her mood.    24 hr chart review: V/S for the past 24 hrs have been WNL. As per nursing documentation, pt slept for a total of 6.5 hrs. She continued to verbalize SI & HI overnight, but was cooperative with cares. She has been compliant with all of her scheduled medications.   Patient assessment note: On encounter today, pt continues to report suicidal ideation with a plan to jump off the balcony of her second floor apartment building. She reports still having the intrusive thoughts to harm her 374 month old baby, but states that she does not want to do any of these. He states that she wants to remain hospitalized until these thoughts resolve. Ativan 2 mg Q 4 H was scheduled yesterday for catatonia since pt showed a great amount of improvement with the 2 mg IM that was given to her. This medication was discontinued overnight, and pt experienced a regress, and had a mask-like facial appearance earlier today morning. Mood extremely depressed and affect congruent. Attention to personal hygiene and grooming is poor, and the need to tend to hygiene has been reiterated. Responses to questions are somewhat delayed.  She reports that her sleep quality  last night was poor and that she did not get any sleep, even though as per nursing documentation, she slept 6.5 hrs last night. Pt denies being in any physical pain today, denies any medication related side effects. No TD/EPS type symptoms found on assessment, and pt denies any feelings of stiffness. AIMS: 0.   Writer called pt's husband [name]([name] at [phone]) as per pt's request to educate on current medications. Medications as listed below explained to husband. Rationales, benefits and possible side effects of medications discussed with husband. Pt's husband stated that prior to this hospitalization, pt was getting 4-5 hrs of sleep nightly, but was still stating that she wasn't sleeping, because she is used to sleeping for more hours. All questions were answered to husband's satisfaction prior to call ending. [name] was also educated to call back with further questions if needed, thanked Clinical research associatewriter and call ended.  Total Time spent with patient: 45 minutes  Past Psychiatric History: Bipolar d/o  Past Medical History:  Past Medical History:  Diagnosis Date   Anxiety    Back spasm    Bipolar I disorder (HCC)    Hypertension    Tachycardia     Past Surgical History:  Procedure Laterality Date   APPENDECTOMY     CHOLECYSTECTOMY     HAND SURGERY     SHOULDER SURGERY     Family History:  Family History  Problem Relation Age of Onset   Hypertension Mother    Hyperlipidemia Mother    Suicidality Father  Depression Father    Family Psychiatric  History: See above Social History:  Social History   Substance and Sexual Activity  Alcohol Use No     Social History   Substance and Sexual Activity  Drug Use No    Social History   Socioeconomic History   Marital status: Married    Spouse name: Not on file   Number of children: Not on file   Years of education: Not on file   Highest education level: Not on file  Occupational History   Not on file  Tobacco Use    Smoking status: Former    Types: Cigarettes    Quit date: 09/10/2016    Years since quitting: 5.9   Smokeless tobacco: Never  Vaping Use   Vaping Use: Never used  Substance and Sexual Activity   Alcohol use: No   Drug use: No   Sexual activity: Not on file  Other Topics Concern   Not on file  Social History Narrative   Not on file   Social Determinants of Health   Financial Resource Strain: Not on file  Food Insecurity: No Food Insecurity (08/11/2022)   Hunger Vital Sign    Worried About Running Out of Food in the Last Year: Never true    Ran Out of Food in the Last Year: Never true  Transportation Needs: No Transportation Needs (08/11/2022)   PRAPARE - Administrator, Civil Service (Medical): No    Lack of Transportation (Non-Medical): No  Physical Activity: Not on file  Stress: Not on file  Social Connections: Not on file   Additional Social History:   Sleep: Poor  Appetite:  Fair  Current Medications: Current Facility-Administered Medications  Medication Dose Route Frequency Provider Last Rate Last Admin   acetaminophen (TYLENOL) tablet 650 mg  650 mg Oral Q6H PRN [name], MD       albuterol (VENTOLIN HFA) 108 (90 Base) MCG/ACT inhaler 2 puff  2 puff Inhalation Q4H PRN [name], MD       alum & mag hydroxide-simeth (MAALOX/MYLANTA) 200-200-20 MG/5ML suspension 30 mL  30 mL Oral Q4H PRN [name], MD       docusate sodium (COLACE) capsule 100 mg  100 mg Oral BID Massengill, [name], MD       hydrOXYzine (ATARAX) tablet 25 mg  25 mg Oral TID PRN [name], MD   25 mg at 08/12/22 0648   lamoTRIgine (LAMICTAL) tablet 150 mg  150 mg Oral Daily [name], MD   150 mg at 08/13/22 0751   LORazepam (ATIVAN) tablet 1 mg  1 mg Oral QID Massengill, [name], MD   1 mg at 08/13/22 1252   magnesium hydroxide (MILK OF MAGNESIA) suspension 30 mL  30 mL Oral Daily PRN [name], MD       NIFEdipine (ADALAT CC) 24 hr tablet 90 mg  90 mg Oral QHS [name], MD   90 mg at  08/12/22 2138   propranolol (INDERAL) tablet 10 mg  10 mg Oral Q8H Massengill, Nathan, MD   10 mg at 08/13/22 1417   QUEtiapine (SEROQUEL) tablet 100 mg  100 mg Oral QHS PRN Massengill, [name], MD       QUEtiapine (SEROQUEL) tablet 300 mg  300 mg Oral QHS Massengill, [name], MD   300 mg at 08/12/22 2136    Lab Results:  Results for orders placed or performed during the hospital encounter of 08/11/22 (from the past 48 hour(s))  Lipid  panel     Status: Abnormal   Collection Time: 08/12/22  6:45 AM  Result Value Ref Range   Cholesterol 202 (H) 0 - 200 mg/dL   Triglycerides 161 (H) <150 mg/dL   HDL 50 >09 mg/dL   Total CHOL/HDL Ratio 4.0 RATIO   VLDL 31 0 - 40 mg/dL   LDL Cholesterol 604 (H) 0 - 99 mg/dL    Comment:        Total Cholesterol/HDL:CHD Risk Coronary Heart Disease Risk Table                     Men   Women  1/2 Average Risk   3.4   3.3  Average Risk       5.0   4.4  2 X Average Risk   9.6   7.1  3 X Average Risk  23.4   11.0        Use the calculated Patient Ratio above and the CHD Risk Table to determine the patient's CHD Risk.        ATP III CLASSIFICATION (LDL):  <100     mg/dL   Optimal  540-981  mg/dL   Near or Above                    Optimal  130-159  mg/dL   Borderline  191-478  mg/dL   High  >295     mg/dL   Very High Performed at [facility], [address], [place], [place] [address]   CBC with Differential/Platelet     Status: None   Collection Time: 08/12/22  6:45 AM  Result Value Ref Range   WBC 10.5 4.0 - 10.5 K/uL   RBC 5.03 3.87 - 5.11 MIL/uL   Hemoglobin 14.0 12.0 - 15.0 g/dL   HCT 86.5 78.4 - 69.6 %   MCV 86.5 80.0 - 100.0 fL   MCH 27.8 26.0 - 34.0 pg   MCHC 32.2 30.0 - 36.0 g/dL   RDW 29.5 28.4 - 13.2 %   Platelets 322 150 - 400 K/uL   nRBC 0.0 0.0 - 0.2 %   Neutrophils Relative % 67 %   Neutro Abs 7.1 1.7 - 7.7 K/uL   Lymphocytes Relative 25 %   Lymphs Abs 2.6 0.7 - 4.0 K/uL   Monocytes Relative 6 %   Monocytes  Absolute 0.6 0.1 - 1.0 K/uL   Eosinophils Relative 1 %   Eosinophils Absolute 0.1 0.0 - 0.5 K/uL   Basophils Relative 1 %   Basophils Absolute 0.1 0.0 - 0.1 K/uL   Immature Granulocytes 0 %   Abs Immature Granulocytes 0.03 0.00 - 0.07 K/uL    Comment: Performed at [facility], [address], [place], [place] [address]    Blood Alcohol level:  Lab Results  Component Value Date   ETH <10 08/10/2022    Metabolic Disorder Labs: Lab Results  Component Value Date   HGBA1C 5.8 (H) 08/10/2022   MPG 120 08/10/2022   No results found for: "PROLACTIN" Lab Results  Component Value Date   CHOL 202 (H) 08/12/2022   TRIG 154 (H) 08/12/2022   HDL 50 08/12/2022   CHOLHDL 4.0 08/12/2022   VLDL 31 08/12/2022   LDLCALC 121 (H) 08/12/2022    Physical Findings: AIMS:  , ,  ,  ,    CIWA:    COWS:     Musculoskeletal: Strength & Muscle Tone: within normal limits  Gait & Station: normal Patient leans: N/A  Psychiatric Specialty Exam:  Presentation  General Appearance:  Disheveled  Eye Contact: Fair  Speech: Blocked  Speech Volume: Decreased  Handedness: Right   Mood and Affect  Mood: Anxious; Depressed  Affect: Congruent   Thought Process  Thought Processes: Coherent  Descriptions of Associations:Intact  Orientation:Full (Time, Place and Person)  Thought Content:Logical  History of Schizophrenia/Schizoaffective disorder:No  Duration of Psychotic Symptoms:No data recorded Hallucinations:Hallucinations: None  Ideas of Reference:None  Suicidal Thoughts:Suicidal Thoughts: Yes, Active SI Active Intent and/or Plan: With Plan (verbally contracts for safety on unit) SI Passive Intent and/or Plan: Without Intent  Homicidal Thoughts:Homicidal Thoughts: Yes, Active (intrusive thoughts to harm her 75 month old baby) HI Active Intent and/or Plan: Without Intent; Without Plan   Sensorium  Memory: Immediate  Good  Judgment: Fair  Insight: Fair  Chartered certified accountant: Fair  Attention Span: Fair  Recall: Jennelle Human of Knowledge: Fair  Language: Fair  Psychomotor Activity  Psychomotor Activity: Psychomotor Activity: Normal  Assets  Assets: Manufacturing systems engineer; Desire for Improvement  Sleep  Sleep: Sleep: Poor  Physical Exam: Physical Exam Constitutional:      Appearance: Normal appearance.  HENT:     Head: Normocephalic.     Nose: Nose normal. No congestion or rhinorrhea.  Eyes:     Pupils: Pupils are equal, round, and reactive to light.  Pulmonary:     Effort: Pulmonary effort is normal.  Musculoskeletal:     Cervical back: Normal range of motion.  Neurological:     Mental Status: She is alert and oriented to person, place, and time.    Review of Systems  Constitutional:  Negative for chills and fever.  HENT: Negative.  Negative for hearing loss.   Eyes: Negative.  Negative for blurred vision.  Respiratory: Negative.  Negative for cough.   Cardiovascular: Negative.  Negative for chest pain.  Gastrointestinal: Negative.   Genitourinary: Negative.   Musculoskeletal: Negative.   Skin: Negative.  Negative for rash.  Neurological: Negative.  Negative for dizziness and headaches.  Psychiatric/Behavioral:  Positive for depression and suicidal ideas. Negative for hallucinations, memory loss and substance abuse. The patient is nervous/anxious and has insomnia.    Blood pressure 132/89, pulse 97, temperature 98.5 F (36.9 C), temperature source Oral, resp. rate 16, height 5\' 5"  (1.651 m), weight 91.6 kg, SpO2 97 %. Body mass index is 33.61 kg/m.  Treatment Plan Summary: Daily contact with patient to assess and evaluate symptoms and progress in treatment and Medication management   Observation Level/Precautions:  15 minute checks  Laboratory:  Labs reviewed   Psychotherapy:  Unit Group sessions  Medications:  See [facility]  Consultations:  To be  determined   Discharge Concerns:  Safety, medication compliance, mood stability  Estimated LOS: 5-7 days  Other:  N/A    Labs Reviewed independently on 08/13/2022:  CMP WNL, Lipid panel reviewed (cholesterol 202, LDL-121, Triglycerides 154-educated on healthy food choices and exercise. HA1C is 5.8 rendering pt a prediabetic-will need PCP f/u after dc. TSH WNL. UA hazy with rare bacteria otherwise WNL. EKG from 12/04 with QTC-445.    PLAN Safety and Monitoring: Voluntary admission to inpatient psychiatric unit for safety, stabilization and treatment Daily contact with patient to assess and evaluate symptoms and progress in treatment Patient's case to be discussed in multi-disciplinary team meeting Observation Level : q15 minute checks Vital signs: q12 hours Precautions: Safety   Long Term Goal(s): Improvement in symptoms so as  ready for discharge   Short Term Goals: Ability to identify changes in lifestyle to reduce recurrence of condition will improve, Ability to verbalize feelings will improve, Ability to disclose and discuss suicidal ideas, Ability to demonstrate self-control will improve, Ability to identify and develop effective coping behaviors will improve, and Compliance with prescribed medications will improve   Diagnoses:  Active Problems:   Asthma   Bipolar I disorder, most recent episode depressed, severe without psychotic features (HCC)   Anxiety   Insomnia   Essential hypertension   Medications -Continue Lamictal 150 mg daily for mood stabilization (home med) -Given Ativan 2 mg IM x 0ne dose for catatonia on 12/06-completed with positive response  noted an hour after medication administration (smiling and talking more coherently) -Restart  Ativan 1 mg QID for catatonia (PLEASE DO NOT DISCONTINUE THIS ORDER) -Continue Inderal 10 mg Q 8 H for anxiety (home med) -Continue Seroquel to 300 mg nightly for mood stabilization (home med-was 25 mg QHS) -Continue Seroquel 100 mg at  bedtime PRN for sleep/anxiety -Continue Nifedipine 90 mg Q HS for hypertension (home med) -Continue Hydroxyzine 25 mg every 6 hours PRN -Continue Albuterol PRN Q 4 H for wheezing/SOB   Other PRNS -Continue Tylenol 650 mg every 6 hours PRN for mild pain -Continue Maalox 30 mg every 4 hrs PRN for indigestion -Continue Milk of Magnesia as needed every 6 hrs for constipation   Discharge Planning: Social work and case management to assist with discharge planning and identification of hospital follow-up needs prior to discharge Estimated LOS: 5-7 days Discharge Concerns: Need to establish a safety plan; Medication compliance and effectiveness Discharge Goals: Return home with outpatient referrals for mental health follow-up including medication management/psychotherapy    Starleen Blue, NP 08/13/2022, 4:01 PM

## 2022-08-13 NOTE — Progress Notes (Signed)
Pt having trouble sleeping. Says she did not sleep last night. Pt given PRNs as appropriate. Will continue to monitor.

## 2022-08-13 NOTE — Progress Notes (Signed)
   08/12/22 2120  Psych Admission Type (Psych Patients Only)  Admission Status Voluntary  Psychosocial Assessment  Patient Complaints Anxiety;Depression;Self-harm thoughts;Other (Comment) (Intrusive thoughts)  Eye Contact Fair  Facial Expression Flat  Affect Anxious;Depressed  Speech Logical/coherent  Interaction Cautious  Motor Activity Slow  Appearance/Hygiene Unremarkable  Behavior Characteristics Cooperative  Mood Depressed;Anxious  Thought Process  Coherency Circumstantial  Content Blaming self  Delusions None reported or observed  Perception WDL  Hallucination None reported or observed  Judgment Impaired  Confusion None  Danger to Self  Current suicidal ideation? Passive (Patient states " I am still having the same racing thoughts about harming my child and suicidal thoughts about jumping off a balcony, but I have no plan or intention of acting on any thoughts of harm while im here.")  Description of Suicide Plan Plan to jump off balcony.  Self-Injurious Behavior Self-injurious ideation verbalized  Agreement Not to Harm Self Yes  Description of Agreement Verbally contracts for safety.  Danger to Others  Danger to Others None reported or observed   Patient alert and oriented. Presenting with an anxious,depressed affect and mood. Patient denies AVH, and pain. Patient endorses anxiety and depression 10/10, with 0 being the least and 10 being the worst. Patient is passive for SI and states " I am still having the same racing thoughts about harming my child and suicidal thoughts about jumping off a balcony, but I have no plan or intention of acting on any thoughts of harm while I'm here." Patient states " I am having the thought about going into my daughters room and getting him from her and drop him off a balcony. I would never do anything to harm him, he's just 4 months old.". Patient is able to verbally contract for safety and agrees to inform a member of staff prior to acting on  any thoughts of self-harm/SI. Scheduled medications administered to patient, per provider orders. Support and encouragement provided. Routine safety checks conducted every 15 minutes. Patient remains safe on the unit.

## 2022-08-13 NOTE — [facility] Counselor (Signed)
Adult Comprehensive Assessment  Patient ID: CATTALEYA WIEN, female   DOB: [birth year], 36 y.o.   MRN: [id number]  Information Source: Information source: Patient  Current Stressors:  Patient states their primary concerns and needs for treatment are:: "Depression, anxiety, suicidal and impulsive thoughts" Patient states their goals for this hospitilization and ongoing recovery are:: "To get better" Educational / Learning stressors: Pt reports having a 12th grade education and some college education Employment / Job issues: Pt reports being unemployed Family Relationships: Pt reports no stressors Surveyor, quantity / Lack of resources (include bankruptcy): Pt reports her husband is a Equities trader / Lack of housing: Pt reports living with her husband and 3 children Physical health (include injuries & life threatening diseases): Pt reports no stressors at this time Social relationships: Pt reports having few social supports Substance abuse: Pt denies any substance use Bereavement / Loss: Pt reports her father passed away in 01/05/15  Living/Environment/Situation:  Living Arrangements: Spouse/significant other, Children Living conditions (as described by patient or guardian): Apartment/[place] Who else lives in the home?: Husband and 3 children How long has patient lived in current situation?: 5 years What is atmosphere in current home: Comfortable, Supportive  Family History:  Marital status: Married Number of Years Married: 5 What types of issues is patient dealing with in the relationship?: None reported Are you sexually active?: Yes What is your sexual orientation?: heterosexual Does patient have children?: Yes How many children?: 3 How is patient's relationship with their children?: Ages 110, 52, and 42 months old "I have 2 daughters and a new born son and we get along good"  Childhood History:  By whom was/is the patient raised?: Both parents Description of patient's  relationship with caregiver when they were a child: "We got along good" Patient's description of current relationship with people who raised him/her: "I still get along good with my mother but my father passed away in Jan 05, 2015" How were you disciplined when you got in trouble as a child/adolescent?: getting whippings, being grounded Does patient have siblings?: Yes Number of Siblings: 1 Description of patient's current relationship with siblings: "I have an older sister and we get along good" Did patient suffer any verbal/emotional/physical/sexual abuse as a child?: No Did patient suffer from severe childhood neglect?: No Has patient ever been sexually abused/assaulted/raped as an adolescent or adult?: No Was the patient ever a victim of a crime or a disaster?: No Witnessed domestic violence?: No Has patient been affected by domestic violence as an adult?: No  Education:  Highest grade of school patient has completed: 12th grade and some college Currently a Consulting civil engineer?: No Learning disability?: No  Employment/Work Situation:   Employment Situation: Unemployed Patient's Job has Been Impacted by Current Illness: No What is the Longest Time Patient has Held a Job?: 5 years Where was the Patient Employed at that Time?: [employer] Has Patient ever Been in the [employer]?: No  Financial Resources:   Financial resources: Income from spouse, Food stamps, Medicaid Does patient have a representative payee or guardian?: No  Alcohol/Substance Abuse:   What has been your use of drugs/alcohol within the last 12 months?: Pt denies any substance use If attempted suicide, did drugs/alcohol play a role in this?: No Alcohol/Substance Abuse Treatment Hx: Denies past history Has alcohol/substance abuse ever caused legal problems?: No  Social Support System:   Conservation officer, nature Support System: Poor Describe Community Support System: Husband Type of faith/religion: Christian How does patient's  faith help to cope with current  illness?: Prayers  Leisure/Recreation:   Do You Have Hobbies?: Yes Leisure and Hobbies: "I don't really have any"  Strengths/Needs:   What is the patient's perception of their strengths?: "I can't think of any right now" Patient states they can use these personal strengths during their treatment to contribute to their recovery: N/A Patient states these barriers may affect/interfere with their treatment: None Patient states these barriers may affect their return to the community: None Other important information patient would like considered in planning for their treatment: None  Discharge Plan:   Currently receiving community mental health services: Yes (From Whom) [facility]) Patient states concerns and preferences for aftercare planning are: Pt is interested in therapy but would like to remain with her current psychiatrist Patient states they will know when they are safe and ready for discharge when: ""When the intrusive thoughts stop" Does patient have access to transportation?: Yes (Husband) Does patient have financial barriers related to discharge medications?: No Will patient be returning to same living situation after discharge?: Yes  Summary/Recommendations:   Summary and Recommendations (to be completed by the evaluator): Tama Grosz is a 36 year old, female, who was admitted to the hospital due to worsening depression, anxiety, suicidal thoughts, and intrusive thoughts. The Pt reports that she has been having thoughts of hurting her 8 month old son.  She states that these thoughts have been occuring for approximately 3 weeks.  The Pt reports living with her husband and 3 children (ages 85, 92, and 4 months).  She states that she has a good relationship wtih her children and her husband.  The Pt reports no childhood abuse or trauma.  She reports being close with her mother and sister and states that her father  passed away in 12-15-2014.  The Pt reports having a 12th grade education and is currently unemployed.  She reports receiving Food Stamps and Medicaid benefits.  The Pt denies any substance use, as well as any current or previous substance use treatment. While in the hospital the Pt can benefit from crisis stabilization, medication evaluation, group therapy, psycho-education, case management, and discharge planning. Upon discharge the Pt would like to return home with her husband and children. It is recommended that that Pt continue to follow-up with her current Psychiatrist at [facility] and begin outpatent services with a local therapy provider.  It is also recommended that the Pt continue to take all medications as prescribed until directed to do otherwise by her providers  Aram Beecham. 08/13/2022

## 2022-08-13 NOTE — Progress Notes (Signed)
Pt wound care completed. Pt had pilonidal cyst removed from sacrum area.  Packing has scant amount of serous fluid on it. No redness noted on surrounding skin. No odor or pus noted. Area cleaned with NS. Gauze used to pack open wound and covered with piece of Aquacel. Secured with large area bandage.

## 2022-08-13 NOTE — Progress Notes (Addendum)
   08/13/22 2050  Psych Admission Type (Psych Patients Only)  Admission Status Voluntary  Psychosocial Assessment  Patient Complaints Anxiety;Depression;Crying spells;Worrying  Eye Contact Brief  Facial Expression Flat  Affect Anxious;Depressed;Sad  Speech Logical/coherent  Interaction Cautious  Motor Activity Slow  Appearance/Hygiene Unremarkable  Mood Depressed;Sad  Thought Process  Coherency Circumstantial  Content WDL  Delusions None reported or observed  Perception WDL  Hallucination None reported or observed  Judgment Impaired  Confusion None  Danger to Self  Current suicidal ideation? Passive  Description of Suicide Plan plan to jump off balcony  Agreement Not to Harm Self Yes  Description of Agreement verbally contracts for safety  Danger to Others  Danger to Others None reported or observed   Progress note   D: Pt seen in dayroom. Pt denies AVH. Pt endorses passive SI with plan to jump off her balcony. Pt contracts for safety at [facility]. Pt endorses thoughts of harming her newborn baby. Pt is tearful when discussing this. Pt rates pain  0/10. Pt rates anxiety  10/10 and depression  10/10. Pt states that she did not have these feelings after the birth of her first two children. Pt endorses racing thoughts that she cannot shut off.   Pt was surprised when the NP told her that it was recorded that she slept 6 hours last night. Pt said she hardly slept at all. She said she was just laying in bed. Pt encouraged to move an arm or leg to let techs know she is awake during nightly rounds. Pt verbalized understanding. Pt states that she feels her medications were not working before admission. Pt states that the medication she has been given here at [facility] are not changing her thoughts. "It's not helping at all. I feel the same. They gave me 300 of Seroquel last night and I could not sleep at all." Pt said she was taking Zyprexa before for her racing thoughts but was unsuccessful with it. Pt  encouraged to speak to provider about her medication concerns.   Pt worried that she will be discharged before she is feeling better and no long has feelings of hurting herself or her newborn. Pt also asked about being transferred to another facility closer to home like [facility] or [facility] for psychiatric treatment. Pt assured that she will be stabilized before discharge but encouraged to voice her concerns to the provider. No other concerns noted at this time.   A: Pt provided support and encouragement. Pt given scheduled medication as prescribed. PRNs as appropriate. Q15 min checks for safety.   R: Pt safe on the unit. Will continue to monitor.

## 2022-08-13 NOTE — [facility] Suicide Risk Assessment (Addendum)
[facility] INPATIENT:  Family/Significant Other Suicide Prevention Education  Suicide Prevention Education:  Education Completed; Jaycee Mckellips [phone] (Husband) has been identified by the patient as the family member/significant other with whom the patient will be residing, and identified as the person(s) who will aid the patient in the event of a mental health crisis (suicidal ideations/suicide attempt).  With written consent from the patient, the family member/significant other has been provided the following suicide prevention education, prior to the and/or following the discharge of the patient.  The suicide prevention education provided includes the following: Suicide risk factors Suicide prevention and interventions National Suicide Hotline telephone number [facility] assessment telephone number [facility] Emergency Assistance 911 [facility] and/or Residential Mobile Crisis Unit telephone number  Request made of family/significant other to: Remove weapons (e.g., guns, rifles, knives), all items previously/currently identified as safety concern.   Remove drugs/medications (over-the-counter, prescriptions, illicit drugs), all items previously/currently identified as a safety concern.  The family member/significant other verbalizes understanding of the suicide prevention education information provided.  The family member/significant other agrees to remove the items of safety concern listed above.  CSW spoke with Mr. Santini who states that "my wife became manic when her father died.  He had Bipolar II with Mania too and he committed suicide".  He states that his wife was on a combination of medications prior to giving birth to their newest child.  He states that after their child's birth his wife's psychiatrist changed her medications and her mental health declined.  He states that his wife has been "acting robotic and like she does not have any emotions".  He states that  his wife cannot take care of their 3 children with this current combination of medications.  He states that he would like his wife's medications to be changed back to what she was taking prior to the birth of their 3rd child.  Mr. Kinkead confirms that his wife can return home after discharge "as long as the thoughts of hurting any of the children are gone and she is not robotic like she has been".  He states that there are no firearms or weapons ion the home.  He states that one of the reasons his wife's medications were changed was due to concerns for breat feeding.  He states that his wife is no longer breast feeding.  CSW completed SPE with Mr. Genson.   Mr. Bagent provided his wife's psychiatrist's phone number as [phone] and states that she can be contacted about his wife's current and previous medications.   Metro Kung Cassey Bacigalupo 08/13/2022, 1:41 PM

## 2022-08-13 NOTE — Progress Notes (Addendum)
D: Pt denied SI/HI/AVH this morning. Pt rated her depression a 10/10, anxiety a 10/10, and feelings of hopelessness a 10/10. Pt complains about not sleeping last night and wrote "I didn't sleep" on her self inventory form. Pt reports that her goal for today is; "I want to try to rest so that my mood can improve". During medication pass pt reported to RN; " I do not want to be discharged anytime soon, I want to stay here until I am completely better". Pt has been calm and cooperative throughout the shift.   A: RN provided support and encouragement to patient. Pt given scheduled medications as prescribed. RN performed wound care as ordered. Q15 min checks verified for safety.    R: Patient verbally contracts for safety. Patient compliant with medications and treatment plan. Patient is interacting well on the unit. Pt is safe on the unit.   08/13/22 1121  Psych Admission Type (Psych Patients Only)  Admission Status Voluntary  Psychosocial Assessment  Patient Complaints Anxiety;Depression;Self-harm thoughts  Eye Contact Fair  Facial Expression Flat  Affect Anxious;Depressed;Sad  Speech Logical/coherent;Slow  Interaction Cautious  Motor Activity Slow  Appearance/Hygiene Unremarkable  Behavior Characteristics Cooperative  Mood Depressed;Anxious  Thought Process  Coherency Circumstantial  Content WDL  Delusions None reported or observed  Perception WDL  Hallucination None reported or observed  Judgment Impaired  Confusion None  Danger to Self  Current suicidal ideation? Passive (Pt states "I have racing thoughts of self harm but I have no intention of acting on them while I am here")  Description of Suicide Plan Plan to jump off balcony  Self-Injurious Behavior Some self-injurious ideation observed or expressed.  No lethal plan expressed   Agreement Not to Harm Self Yes  Description of Agreement Pt verbally contracts for safety  Danger to Others  Danger to Others None reported or  observed

## 2022-08-13 NOTE — Progress Notes (Signed)
   08/13/22 0538  Sleep  Number of Hours 6.5

## 2022-08-13 NOTE — [facility] Group Notes (Signed)
[facility] Group Notes:  (Nursing/MHT/Case Management/Adjunct)  Date:  08/13/2022  Time:  9:04 PM  Type of Therapy:  Group Therapy  Participation Level:  none  Participation Quality:  Inattentive  Affect:  Depressed  Cognitive:  Lacking  Insight:  None  Engagement in Group:  None  Modes of Intervention:  Discussion  Summary of Progress/Problems:  Kara Stephens 08/13/2022, 9:04 PM

## 2022-08-13 NOTE — Group Note (Signed)
LCSW Group Therapy Note   Group Date: 08/13/2022 Start Time: 1100 End Time: 1200  Type of Therapy and Topic:  Group Therapy - Healthy vs Unhealthy Coping Skills  Participation Level:  Active   Description of Group The focus of this group was to determine what unhealthy coping techniques typically are used by group members and what healthy coping techniques would be helpful in coping with various problems. Patients were guided in becoming aware of the differences between healthy and unhealthy coping techniques. Patients were asked to identify 2-3 healthy coping skills they would like to learn to use more effectively.  Therapeutic Goals Patients learned that coping is what human beings do all day long to deal with various situations in their lives Patients defined and discussed healthy vs unhealthy coping techniques Patients identified their preferred coping techniques and identified whether these were healthy or unhealthy Patients determined 2-3 healthy coping skills they would like to become more familiar with and use more often. Patients provided support and ideas to each other   Summary of Patient Progress:  During group, patient expressed knowledge and understanding of what it means to cope with stress. Pt actively engaged in identifying unhealthy coping mechanisms they have utilized in the past. Pt actively engaged in processing means of coping and what outcomes occur from such methods. Pt further engaged in discussion, identifying healthy coping mechanisms they have used in the past. Pt engaged in processing the use of healthier mechanisms and how these produce different gains to unhealthy mechanisms. Pt actively identified other coping mechanisms they would be willing to try in the future. Pt proved receptive of alternate group members input and feedback from CSW.   Therapeutic Modalities Cognitive Behavioral Therapy Motivational Interviewing  Ailed Defibaugh M Glendora Clouatre, LCSWA 08/13/2022  1:23  PM    

## 2022-08-14 DIAGNOSIS — F3160 Bipolar disorder, current episode mixed, unspecified: Secondary | ICD-10-CM | POA: Diagnosis not present

## 2022-08-14 MED ORDER — TRAZODONE HCL 50 MG PO TABS
50.0000 mg | ORAL_TABLET | Freq: Every evening | ORAL | Status: DC | PRN
  Administered 2022-08-17 – 2022-09-03 (×7): 50 mg via ORAL
  Filled 2022-08-14 (×7): qty 1

## 2022-08-14 MED ORDER — LAMOTRIGINE 200 MG PO TABS
200.0000 mg | ORAL_TABLET | Freq: Every day | ORAL | Status: DC
  Administered 2022-08-15 – 2022-09-04 (×21): 200 mg via ORAL
  Filled 2022-08-14 (×22): qty 1

## 2022-08-14 MED ORDER — LORAZEPAM 1 MG PO TABS
2.0000 mg | ORAL_TABLET | Freq: Four times a day (QID) | ORAL | Status: DC
  Administered 2022-08-14 – 2022-08-17 (×12): 2 mg via ORAL
  Filled 2022-08-14 (×12): qty 2

## 2022-08-14 MED ORDER — TRAZODONE HCL 50 MG PO TABS
50.0000 mg | ORAL_TABLET | Freq: Every day | ORAL | Status: DC
  Administered 2022-08-14 – 2022-08-16 (×3): 50 mg via ORAL
  Filled 2022-08-14 (×6): qty 1

## 2022-08-14 MED ORDER — ENSURE ENLIVE PO LIQD
237.0000 mL | Freq: Three times a day (TID) | ORAL | Status: DC
  Administered 2022-08-14 – 2022-08-20 (×14): 237 mL via ORAL
  Filled 2022-08-14 (×23): qty 237

## 2022-08-14 MED ORDER — MELATONIN 5 MG PO TABS
5.0000 mg | ORAL_TABLET | Freq: Every day | ORAL | Status: DC
  Administered 2022-08-14 – 2022-08-24 (×11): 5 mg via ORAL
  Filled 2022-08-14 (×19): qty 1

## 2022-08-14 NOTE — Group Note (Signed)
Recreation Therapy Group Note   Group Topic:Stress Management  Group Date: 08/14/2022 Start Time: 0930 End Time: 1000 Facilitators: Addie Alonge-McCall, LRT,CTRS Location: [address] Dayroom   Goal Area(s) Addresses:  Patient will identify positive stress management techniques. Patient will identify benefits of using stress management post d/c.   Group Description:  Relaxation.  LRT and patients discussed the importance of relaxation and how it affects you.  LRT and patients also discussed the many ways/methods you can use to relax, such as soothing music, going on a walk, watching certain types of movies, meditation, etc.  Patients were encouraged to try some of the methods mentioned to help them relax.   Affect/Mood: N/A   Participation Level: Did not attend    Clinical Observations/Individualized Feedback:      Plan: Continue to engage patient in RT group sessions 2-3x/week.   Rida Loudin-McCall, LRT,CTRS 08/14/2022 11:40 AM

## 2022-08-14 NOTE — Progress Notes (Signed)
[facility] MD Progress Note  08/14/2022 2:02 PM Kara Stephens  MRN:  [id number] Principal Problem: Bipolar 1 disorder, mixed (HCC) Diagnosis: Active Problems:   Asthma   Bipolar I disorder, most recent episode depressed, severe without psychotic features (HCC)   Anxiety   Insomnia   Essential hypertension  Reason For Admission: Kara Stephens is a 36 yo Caucasian female with a prior mental health history of bipolar d/o who presented to the [facility] health urgent care [facility]) accompanied by her husband on 12/4 with complaints of worsening depressive symptoms with a plan to jump off their [address]nd storey apartment building & intrusive thoughts to harm her 89 month old baby. Pt was transferred voluntarily to this [facility] for treatment and stabilization of her mood.    24 hr chart review: V/S for the past 24 hrs have been WNL. As per nursing documentation, pt slept for a total of 5.5 hrs. She continued to verbalize SI & HI overnight, but was cooperative with care. She has been compliant with all of her scheduled medications. Mood over the past 24 hrs has been anxious, depressed & she has verbalized feelings of sadness.   Patient assessment note: Mood today is depressed, affect is flat and congruent. Pt reports suicidal ideations, still with plan to jump off the balcony of her second story apartment building. She reports intrusive thoughts to drop her baby off the balcony of the second floor of her apartment building. She reports that she does not want to harm herself or her baby though, and wants to get better, and wants the thoughts to resolve prior to discharge. Speech is more coherent today, but attention to personal hygiene and grooming remains poor. Pt has been provided with positive reinforcements and supplies to complete personal hygiene. Wound care to sacral area completed by pt's RN last night.   Pt continues to report that she is not sleeping at night, but as per nursing reports and  documentation, she is getting some sleep nightly. As per flow sheets, she slept a total of 5.5 hrs last night. She reports that her appetite is poor, and asked for Ensure Shakes for nutritional support. We are ordering these to be given 3 times in between meals. We are adding Trazodone back to pt's medication regimen and scheduling 50 mg and keeping 50 mg as PRN for insomnia. We are also increasing Lamictal to 200 mg for mood stabilization and continuing other medications as listed below.  Total Time spent with patient: 45 minutes  Past Psychiatric History: Bipolar d/o  Past Medical History:  Past Medical History:  Diagnosis Date   Anxiety    Back spasm    Bipolar I disorder (HCC)    Hypertension    Tachycardia     Past Surgical History:  Procedure Laterality Date   APPENDECTOMY     CHOLECYSTECTOMY     HAND SURGERY     SHOULDER SURGERY     Family History:  Family History  Problem Relation Age of Onset   Hypertension Mother    Hyperlipidemia Mother    Suicidality Father    Depression Father    Family Psychiatric  History: See above Social History:  Social History   Substance and Sexual Activity  Alcohol Use No     Social History   Substance and Sexual Activity  Drug Use No    Social History   Socioeconomic History   Marital status: Married    Spouse name: Not on file  Number of children: Not on file   Years of education: Not on file   Highest education level: Not on file  Occupational History   Not on file  Tobacco Use   Smoking status: Former    Types: Cigarettes    Quit date: 09/10/2016    Years since quitting: 5.9   Smokeless tobacco: Never  Vaping Use   Vaping Use: Never used  Substance and Sexual Activity   Alcohol use: No   Drug use: No   Sexual activity: Not on file  Other Topics Concern   Not on file  Social History Narrative   Not on file   Social Determinants of Health   Financial Resource Strain: Not on file  Food Insecurity: No Food  Insecurity (08/11/2022)   Hunger Vital Sign    Worried About Running Out of Food in the Last Year: Never true    Ran Out of Food in the Last Year: Never true  Transportation Needs: No Transportation Needs (08/11/2022)   PRAPARE - Administrator, Civil ServiceTransportation    Lack of Transportation (Medical): No    Lack of Transportation (Non-Medical): No  Physical Activity: Not on file  Stress: Not on file  Social Connections: Not on file   Additional Social History:   Sleep: Poor  Appetite:  Fair  Current Medications: Current Facility-Administered Medications  Medication Dose Route Frequency Provider Last Rate Last Admin   acetaminophen (TYLENOL) tablet 650 mg  650 mg Oral Q6H PRN Park PopeJi, Andrew, MD       albuterol (VENTOLIN HFA) 108 (90 Base) MCG/ACT inhaler 2 puff  2 puff Inhalation Q4H PRN Park PopeJi, Andrew, MD       alum & mag hydroxide-simeth (MAALOX/MYLANTA) 200-200-20 MG/5ML suspension 30 mL  30 mL Oral Q4H PRN Park PopeJi, Andrew, MD       docusate sodium (COLACE) capsule 100 mg  100 mg Oral BID Massengill, Harrold DonathNathan, MD   100 mg at 08/14/22 0736   feeding supplement (ENSURE ENLIVE / ENSURE PLUS) liquid 237 mL  237 mL Oral TID BM Ashanta Amoroso, NP   237 mL at 08/14/22 1343   hydrOXYzine (ATARAX) tablet 25 mg  25 mg Oral TID PRN Park PopeJi, Andrew, MD   25 mg at 08/12/22 0648   [START ON 08/15/2022] lamoTRIgine (LAMICTAL) tablet 200 mg  200 mg Oral Daily Massengill, Nathan, MD       LORazepam (ATIVAN) tablet 2 mg  2 mg Oral QID Massengill, Nathan, MD   2 mg at 08/14/22 1135   magnesium hydroxide (MILK OF MAGNESIA) suspension 30 mL  30 mL Oral Daily PRN Park PopeJi, Andrew, MD       melatonin tablet 5 mg  5 mg Oral QHS Massengill, Nathan, MD       NIFEdipine (ADALAT CC) 24 hr tablet 90 mg  90 mg Oral QHS Park PopeJi, Andrew, MD   90 mg at 08/13/22 2059   propranolol (INDERAL) tablet 10 mg  10 mg Oral Q8H Massengill, Nathan, MD   10 mg at 08/14/22 1335   QUEtiapine (SEROQUEL) tablet 100 mg  100 mg Oral QHS PRN Massengill, Harrold DonathNathan, MD   100 mg at 08/13/22  2249   QUEtiapine (SEROQUEL) tablet 300 mg  300 mg Oral QHS Massengill, Harrold DonathNathan, MD   300 mg at 08/13/22 2059   traZODone (DESYREL) tablet 50 mg  50 mg Oral QHS Massengill, Harrold DonathNathan, MD       traZODone (DESYREL) tablet 50 mg  50 mg Oral QHS PRN Phineas InchesMassengill, Nathan, MD  Lab Results:  No results found for this or any previous visit (from the past 48 hour(s)).   Blood Alcohol level:  Lab Results  Component Value Date   ETH <10 08/10/2022    Metabolic Disorder Labs: Lab Results  Component Value Date   HGBA1C 5.8 (H) 08/10/2022   MPG 120 08/10/2022   No results found for: "PROLACTIN" Lab Results  Component Value Date   CHOL 202 (H) 08/12/2022   TRIG 154 (H) 08/12/2022   HDL 50 08/12/2022   CHOLHDL 4.0 08/12/2022   VLDL 31 08/12/2022   LDLCALC 121 (H) 08/12/2022    Physical Findings: AIMS:  , ,  ,  ,    CIWA:    COWS:     Musculoskeletal: Strength & Muscle Tone: within normal limits Gait & Station: normal Patient leans: N/A  Psychiatric Specialty Exam:  Presentation  General Appearance:  Disheveled  Eye Contact: Fair  Speech: Clear and Coherent  Speech Volume: Normal  Handedness: Right   Mood and Affect  Mood: Depressed  Affect: Flat   Thought Process  Thought Processes: Coherent  Descriptions of Associations:Intact  Orientation:Full (Time, Place and Person)  Thought Content:Logical  History of Schizophrenia/Schizoaffective disorder:No  Duration of Psychotic Symptoms:No data recorded Hallucinations:Hallucinations: None  Ideas of Reference:None  Suicidal Thoughts:Suicidal Thoughts: Yes, Active SI Active Intent and/or Plan: With Plan; Without Intent  Homicidal Thoughts:Homicidal Thoughts: Yes, Active HI Active Intent and/or Plan: With Plan; Without Intent   Sensorium  Memory: Immediate Good  Judgment: Fair  Insight: Fair  Art therapist  Concentration: Fair  Attention Span: Fair  Recall: Jennelle Human of  Knowledge: Fair  Language: Fair  Psychomotor Activity  Psychomotor Activity: Psychomotor Activity: Normal  Assets  Assets: Manufacturing systems engineer; Housing; Social Support  Sleep  Sleep: Sleep: Poor  Physical Exam: Physical Exam Constitutional:      Appearance: Normal appearance.  HENT:     Head: Normocephalic.     Nose: Nose normal. No congestion or rhinorrhea.  Eyes:     Pupils: Pupils are equal, round, and reactive to light.  Pulmonary:     Effort: Pulmonary effort is normal.  Musculoskeletal:     Cervical back: Normal range of motion.  Neurological:     Mental Status: She is alert and oriented to person, place, and time.    Review of Systems  Constitutional:  Negative for chills and fever.  HENT: Negative.  Negative for hearing loss.   Eyes: Negative.  Negative for blurred vision.  Respiratory: Negative.  Negative for cough.   Cardiovascular: Negative.  Negative for chest pain.  Gastrointestinal: Negative.   Genitourinary: Negative.   Musculoskeletal: Negative.   Skin: Negative.  Negative for rash.  Neurological: Negative.  Negative for dizziness and headaches.  Psychiatric/Behavioral:  Positive for depression and suicidal ideas. Negative for hallucinations, memory loss and substance abuse. The patient is nervous/anxious and has insomnia.    Blood pressure 121/86, pulse (!) 104, temperature 98.2 F (36.8 C), temperature source Oral, resp. rate 20, height 5\' 5"  (1.651 m), weight 91.6 kg, SpO2 98 %. Body mass index is 33.61 kg/m.  Treatment Plan Summary: Daily contact with patient to assess and evaluate symptoms and progress in treatment and Medication management   Observation Level/Precautions:  15 minute checks  Laboratory:  Labs reviewed   Psychotherapy:  Unit Group sessions  Medications:  See [facility]  Consultations:  To be determined   Discharge Concerns:  Safety, medication compliance, mood stability  Estimated LOS: 5-7 days  Other:  N/A    Labs  Reviewed independently on 08/13/2022:  CMP WNL, Lipid panel reviewed (cholesterol 202, LDL-121, Triglycerides 154-educated on healthy food choices and exercise. HA1C is 5.8 rendering pt a prediabetic-will need PCP f/u after dc. TSH WNL. UA hazy with rare bacteria otherwise WNL. EKG from 12/04 with QTC-445.    PLAN Safety and Monitoring: Voluntary admission to inpatient psychiatric unit for safety, stabilization and treatment Daily contact with patient to assess and evaluate symptoms and progress in treatment Patient's case to be discussed in multi-disciplinary team meeting Observation Level : q15 minute checks Vital signs: q12 hours Precautions: Safety   Long Term Goal(s): Improvement in symptoms so as ready for discharge   Short Term Goals: Ability to identify changes in lifestyle to reduce recurrence of condition will improve, Ability to verbalize feelings will improve, Ability to disclose and discuss suicidal ideas, Ability to demonstrate self-control will improve, Ability to identify and develop effective coping behaviors will improve, and Compliance with prescribed medications will improve   Diagnoses:  Active Problems:   Asthma   Bipolar I disorder, most recent episode depressed, severe without psychotic features (HCC)   Anxiety   Insomnia   Essential hypertension   Medications -Start Ensure Nutritional shakes TID in between meals for nutritional support -Restart Trazodone at 50 mg nightly and 50 mg PRN for insomnia -Increase Lamictal 200 mg daily for mood stabilization (home med) -Continue Ativan 1 mg QID for catatonia (PLEASE DO NOT DISCONTINUE THIS ORDER) -Continue Inderal 10 mg Q 8 H for anxiety (home med) -Continue Seroquel to 300 mg nightly for mood stabilization (home med-was 25 mg QHS) -Continue Seroquel 100 mg at bedtime PRN for sleep/anxiety -Continue Nifedipine 90 mg Q HS for hypertension (home med) -Continue Hydroxyzine 25 mg every 6 hours PRN -Continue Albuterol PRN  Q 4 H for wheezing/SOB -Given Ativan 2 mg IM x 0ne dose for catatonia on 12/06-completed with positive response  noted an hour after medication administration (smiling and talking more coherently)  Other PRNS -Continue Tylenol 650 mg every 6 hours PRN for mild pain -Continue Maalox 30 mg every 4 hrs PRN for indigestion -Continue Milk of Magnesia as needed every 6 hrs for constipation   Discharge Planning: Social work and case management to assist with discharge planning and identification of hospital follow-up needs prior to discharge Estimated LOS: 5-7 days Discharge Concerns: Need to establish a safety plan; Medication compliance and effectiveness Discharge Goals: Return home with outpatient referrals for mental health follow-up including medication management/psychotherapy   Starleen Blue, NP 08/14/2022, 2:02 PMPatient ID: Barnet Pall, female   DOB: 30-Apr-1986, 36 y.o.   MRN: [id number]

## 2022-08-14 NOTE — Progress Notes (Signed)
   08/14/22 0541  Sleep  Number of Hours 5.5

## 2022-08-14 NOTE — Progress Notes (Signed)
RN completed wound care for patient. Prior gauze has small amount of serous fluid on it. No odor, pus, redness, signs of infection observed. RN cleansed area with NS, packed wound with gauze, and placed Aquacel bandage over wound, secured with two large bandages. Pt expressed no pain during dressing change.

## 2022-08-14 NOTE — [facility] Group Notes (Signed)
Pt attended AA Group.  

## 2022-08-14 NOTE — Progress Notes (Signed)
Pt said she still didn't sleep last night. "I was just laying there." Pt was not moving during checks. Proposed to pt that maybe she was not resting. PTA, pt was taking Seroquel and Trazodone 100 mg. Pt stated that she did manage to get some sleep with the combination of Seroquel and Trazodone at night.

## 2022-08-14 NOTE — Progress Notes (Addendum)
D: Pt reported passive SI this morning with a plan to jump off a balcony. Pt verbally contracts for safety. Pt reports HI in the context of hurting her baby. Pt denies AVH. Pt reports that she is having intrusive thoughts of picking up her baby and dropping him. This morning pt reports "I don't think my medications are helping me". Pt reports that she is not sleeping despite night shift observing her sleeping. Pt still presents with slow motor movement but seems more talkative and less catatonic today, pt seen smiling during interactions as well. Pt denied feelings of drowsiness following Ativan. Pt reports she had a bowel movement this afternoon. Pt rated her depression a 10/10, anxiety a 10/10, and feelings of hopelessness a 10/10. Pt has been calm and cooperative throughout the shift.   A: RN provided support and encouragement to patient. Pt given scheduled medications as prescribed. RN performed wound care for patient as ordered. Q15 min checks verified for safety.    R: Patient verbally contracts for safety. Patient compliant with medications and treatment plan. Patient is interacting well on the unit. Pt is safe on the unit.   08/14/22 1000  Psych Admission Type (Psych Patients Only)  Admission Status Voluntary  Psychosocial Assessment  Patient Complaints Anxiety;Depression;Worrying;Sleep disturbance  Eye Contact Fair  Facial Expression Flat  Affect Anxious;Depressed;Sad  Speech Logical/coherent;Slow  Interaction Cautious  Motor Activity Slow  Appearance/Hygiene Unremarkable  Behavior Characteristics Cooperative  Mood Anxious;Depressed;Sad  Thought Process  Coherency Circumstantial  Content WDL  Delusions None reported or observed  Perception WDL  Hallucination None reported or observed  Judgment Impaired  Confusion None  Danger to Self  Current suicidal ideation? Passive  Description of Suicide Plan Plan to jump off balcony  Self-Injurious Behavior Some self-injurious ideation  observed or expressed.  No lethal plan expressed   Agreement Not to Harm Self Yes  Description of Agreement Pt verbally contracts for safety  Danger to Others  Danger to Others None reported or observed

## 2022-08-15 DIAGNOSIS — F3160 Bipolar disorder, current episode mixed, unspecified: Secondary | ICD-10-CM | POA: Diagnosis not present

## 2022-08-15 NOTE — [facility] Group Notes (Signed)
[facility] Group Notes:  (Nursing/MHT/Case Management/Adjunct)  Date:  08/15/2022  Time:  1300  Type of Therapy:  Psychoeducational Skills  Participation Level:  Active  Participation Quality:  Attentive  Affect:  Appropriate  Cognitive:  Appropriate  Insight:  Appropriate  Engagement in Group:  Engaged  Modes of Intervention:  Discussion, Exploration, Rapport Building, Socialization, and Support  Summary of Progress/Problems:  Shela Nevin 08/15/2022, 5:20 PM

## 2022-08-15 NOTE — Progress Notes (Signed)
[facility] MD Progress Note  08/15/2022 11:37 AM Kara Stephens  MRN:  [id number] Principal Problem: Bipolar 1 disorder, mixed (HCC) Diagnosis: Active Problems:   Asthma   Bipolar I disorder, most recent episode depressed, severe without psychotic features (HCC)   Anxiety   Insomnia   Essential hypertension  Reason For Admission: Kara Stephens is a 36 yo Caucasian female with a prior mental health history of bipolar d/o who presented to the [facility] health urgent care [facility]) accompanied by her husband on 12/4 with complaints of worsening depressive symptoms with a plan to jump off their [address] storey apartment building & intrusive thoughts to harm her 66 month old baby. Pt was transferred voluntarily to this [facility] for treatment and stabilization of her mood.    24 hr chart review: V/S for the past 24 hrs have been WNL. She has continued to be compliant with her scheduled medications, and did not require any PRN medications overnight.   Patient assessment note: Mood today remains depressed, affect is flat and congruent. Pt continues to report feeling suicidal with a plan to jump off the balcony of her 2nd story apartment building. She reports that she is continuing to have intrusive thoughts to harm her 62 month old baby by dropping him off the balcony of her second floor apartment. She reports feeling very tired today morning, states that she was unable to sleep last night. As per nursing reports, she slept last night even though sleep times for last night are not documented. Pt reports that her appetite is fair. She denies AVH, denies paranoia and denies other delusional thinking.   Pt denies any medication related side effects. Lamictal was increased yesterday to 200 mg for mood stabilization, and Trazodone was added for sleep (50 mg scheduled and 50 mg PRN). Pt is sleeping at least for some hours at night, even though she is reporting that she is "not sleeping at all."Kara med  changes were just completed yesterday, we are continuing current medications, and allowing time for medications to be effective. We will consider med changes after a few days if there is still no improvement in pt's presentation. No TD/EPS type symptoms found on assessment, and pt denies any feelings of stiffness. AIMS: 0.   Total Time spent with patient: 45 minutes  Past Psychiatric History: Bipolar d/o  Past Medical History:  Past Medical History:  Diagnosis Date   Anxiety    Back spasm    Bipolar I disorder (HCC)    Hypertension    Tachycardia     Past Surgical History:  Procedure Laterality Date   APPENDECTOMY     CHOLECYSTECTOMY     HAND SURGERY     SHOULDER SURGERY     Family History:  Family History  Problem Relation Age of Onset   Hypertension Mother    Hyperlipidemia Mother    Suicidality Father    Depression Father    Family Psychiatric  History: See above Social History:  Social History   Substance and Sexual Activity  Alcohol Use No     Social History   Substance and Sexual Activity  Drug Use No    Social History   Socioeconomic History   Marital status: Married    Spouse name: Not on file   Number of children: Not on file   Years of education: Not on file   Highest education level: Not on file  Occupational History   Not on file  Tobacco Use  Smoking status: Former    Types: Cigarettes    Quit date: 09/10/2016    Years Kara quitting: 5.9   Smokeless tobacco: Never  Vaping Use   Vaping Use: Never used  Substance and Sexual Activity   Alcohol use: No   Drug use: No   Sexual activity: Not on file  Other Topics Concern   Not on file  Social History Narrative   Not on file   Social Determinants of Health   Financial Resource Strain: Not on file  Food Insecurity: No Food Insecurity (08/11/2022)   Hunger Vital Sign    Worried About Running Out of Food in the Last Year: Never true    Ran Out of Food in the Last Year: Never true   Transportation Needs: No Transportation Needs (08/11/2022)   PRAPARE - Administrator, Civil Service (Medical): No    Lack of Transportation (Non-Medical): No  Physical Activity: Not on file  Stress: Not on file  Social Connections: Not on file   Additional Social History:   Sleep: Poor  Appetite:  Fair  Current Medications: Current Facility-Administered Medications  Medication Dose Route Frequency Provider Last Rate Last Admin   acetaminophen (TYLENOL) tablet 650 mg  650 mg Oral Q6H PRN [name], MD       albuterol (VENTOLIN HFA) 108 (90 Base) MCG/ACT inhaler 2 puff  2 puff Inhalation Q4H PRN [name], MD       alum & mag hydroxide-simeth (MAALOX/MYLANTA) 200-200-20 MG/5ML suspension 30 mL  30 mL Oral Q4H PRN [name], MD       docusate sodium (COLACE) capsule 100 mg  100 mg Oral BID Massengill, [name], MD   100 mg at 08/15/22 0818   feeding supplement (ENSURE ENLIVE / ENSURE PLUS) liquid 237 mL  237 mL Oral TID BM Rashel Okeefe, NP   237 mL at 08/15/22 1110   hydrOXYzine (ATARAX) tablet 25 mg  25 mg Oral TID PRN [name], MD   25 mg at 08/12/22 4098   lamoTRIgine (LAMICTAL) tablet 200 mg  200 mg Oral Daily Massengill, [name], MD   200 mg at 08/15/22 0818   LORazepam (ATIVAN) tablet 2 mg  2 mg Oral QID Massengill, [name], MD   2 mg at 08/15/22 1110   magnesium hydroxide (MILK OF MAGNESIA) suspension 30 mL  30 mL Oral Daily PRN [name], MD       melatonin tablet 5 mg  5 mg Oral QHS Massengill, [name], MD   5 mg at 08/14/22 2100   NIFEdipine (ADALAT CC) 24 hr tablet 90 mg  90 mg Oral [name], MD   90 mg at 08/14/22 2122   propranolol (INDERAL) tablet 10 mg  10 mg Oral Q8H Massengill, [name], MD   10 mg at 08/15/22 0617   QUEtiapine (SEROQUEL) tablet 100 mg  100 mg Oral QHS PRN Massengill, [name], MD   100 mg at 08/13/22 2249   QUEtiapine (SEROQUEL) tablet 300 mg  300 mg Oral QHS Massengill, [name], MD   300 mg at 08/14/22 2121   traZODone (DESYREL) tablet  50 mg  50 mg Oral QHS Massengill, [name], MD   50 mg at 08/14/22 2121   traZODone (DESYREL) tablet 50 mg  50 mg Oral QHS PRN Massengill, [name], MD       Lab Results:  No results found for this or any previous visit (from the past 48 hour(s)).  Blood Alcohol level:  Lab Results  Component Value  Date   ETH <10 08/10/2022   Metabolic Disorder Labs: Lab Results  Component Value Date   HGBA1C 5.8 (H) 08/10/2022   MPG 120 08/10/2022   No results found for: "PROLACTIN" Lab Results  Component Value Date   CHOL 202 (H) 08/12/2022   TRIG 154 (H) 08/12/2022   HDL 50 08/12/2022   CHOLHDL 4.0 08/12/2022   VLDL 31 08/12/2022   LDLCALC 121 (H) 08/12/2022   Physical Findings: AIMS: 0 CIWA: n/a COWS:n/a  Musculoskeletal: Strength & Muscle Tone: within normal limits Gait & Station: normal Patient leans: N/A  Psychiatric Specialty Exam:  Presentation  General Appearance:  Appropriate for Environment  Eye Contact: Fair  Speech: Clear and Coherent  Speech Volume: Normal  Handedness: Right  Mood and Affect  Mood: Depressed  Affect: Congruent  Thought Process  Thought Processes: Coherent  Descriptions of Associations:Intact  Orientation:Full (Time, Place and Person)  Thought Content:Logical  History of Schizophrenia/Schizoaffective disorder:No  Duration of Psychotic Symptoms:No data recorded Hallucinations:Hallucinations: None  Ideas of Reference:None  Suicidal Thoughts:Suicidal Thoughts: Yes, Active SI Active Intent and/or Plan: With Plan; Without Intent  Homicidal Thoughts:Homicidal Thoughts: Yes, Active HI Active Intent and/or Plan: Without Intent (intrusive thoughts to drop her 40 month old baby off balcony of second floor)   Sensorium  Memory: Immediate Good  Judgment: Fair  Insight: Fair  Chartered certified accountant: Poor  Attention Span: Fair  Recall: [employer] of Knowledge: Fair  Language: Fair  Psychomotor  Activity  Psychomotor Activity: Psychomotor Activity: Normal  Assets  Assets: Communication Skills  Sleep  Sleep: Sleep: Poor  Physical Exam: Physical Exam Constitutional:      Appearance: Normal appearance.  HENT:     Head: Normocephalic.     Nose: Nose normal. No congestion or rhinorrhea.  Eyes:     Pupils: Pupils are equal, round, and reactive to light.  Pulmonary:     Effort: Pulmonary effort is normal.  Musculoskeletal:     Cervical back: Normal range of motion.  Neurological:     Mental Status: She is alert and oriented to person, place, and time.    Review of Systems  Constitutional:  Negative for chills and fever.  HENT: Negative.  Negative for hearing loss.   Eyes: Negative.  Negative for blurred vision.  Respiratory: Negative.  Negative for cough.   Cardiovascular: Negative.  Negative for chest pain.  Gastrointestinal: Negative.   Genitourinary: Negative.   Musculoskeletal: Negative.   Skin: Negative.  Negative for rash.  Neurological: Negative.  Negative for dizziness and headaches.  Psychiatric/Behavioral:  Positive for depression and suicidal ideas. Negative for hallucinations, memory loss and substance abuse. The patient is nervous/anxious and has insomnia.    Blood pressure 104/72, pulse (!) 104, temperature 98.3 F (36.8 C), temperature source Oral, resp. rate 20, height 5\' 5"  (1.651 m), weight 91.6 kg, SpO2 98 %. Body mass index is 33.61 kg/m.  Treatment Plan Summary: Daily contact with patient to assess and evaluate symptoms and progress in treatment and Medication management   Observation Level/Precautions:  15 minute checks  Laboratory:  Labs reviewed   Psychotherapy:  Unit Group sessions  Medications:  See [facility]  Consultations:  To be determined   Discharge Concerns:  Safety, medication compliance, mood stability  Estimated LOS: 5-7 days  Other:  N/A    Labs Reviewed independently on 08/13/2022:  CMP WNL, Lipid panel reviewed (cholesterol  202, LDL-121, Triglycerides 154-educated on healthy food choices and exercise. HA1C is 5.8 rendering pt  a prediabetic-will need PCP f/u after dc. TSH WNL. UA hazy with rare bacteria otherwise WNL. EKG from 12/04 with QTC-445.    PLAN Safety and Monitoring: Voluntary admission to inpatient psychiatric unit for safety, stabilization and treatment Daily contact with patient to assess and evaluate symptoms and progress in treatment Patient's case to be discussed in multi-disciplinary team meeting Observation Level : q15 minute checks Vital signs: q12 hours Precautions: Safety   Long Term Goal(s): Improvement in symptoms so as ready for discharge   Short Term Goals: Ability to identify changes in lifestyle to reduce recurrence of condition will improve, Ability to verbalize feelings will improve, Ability to disclose and discuss suicidal ideas, Ability to demonstrate self-control will improve, Ability to identify and develop effective coping behaviors will improve, and Compliance with prescribed medications will improve   Diagnoses:  Active Problems:   Asthma   Bipolar I disorder, most recent episode depressed, severe without psychotic features (HCC)   Anxiety   Insomnia   Essential hypertension   Medications -Continue Ensure Nutritional shakes TID in between meals for nutritional support -Continue Trazodone 50 mg nightly and 50 mg PRN for insomnia -Continue Lamictal 200 mg daily for mood stabilization (home med) -Continue Ativan 1 mg QID for catatonia (PLEASE DO NOT DISCONTINUE THIS ORDER) -Continue Inderal 10 mg Q 8 H for anxiety (home med) -Continue Seroquel to 300 mg nightly for mood stabilization (home med-was 25 mg QHS) -Continue Seroquel 100 mg at bedtime PRN for sleep/anxiety -Continue Nifedipine 90 mg Q HS for hypertension (home med) -Continue Hydroxyzine 25 mg every 6 hours PRN -Continue Albuterol PRN Q 4 H for wheezing/SOB -Given Ativan 2 mg IM x 0ne dose for catatonia on  12/06-completed with positive response  noted an hour after medication administration (smiling and talking more coherently)  Other PRNS -Continue Tylenol 650 mg every 6 hours PRN for mild pain -Continue Maalox 30 mg every 4 hrs PRN for indigestion -Continue Milk of Magnesia as needed every 6 hrs for constipation   Discharge Planning: Social work and case management to assist with discharge planning and identification of hospital follow-up needs prior to discharge Estimated LOS: 5-7 days Discharge Concerns: Need to establish a safety plan; Medication compliance and effectiveness Discharge Goals: Return home with outpatient referrals for mental health follow-up including medication management/psychotherapy   Starleen Blueoris  Denay Pleitez, NP 08/15/2022, 11:37 AMPatient ID: Barnet PallKristen B Levick, female   DOB: 09/14/1985, 36 y.o.   MRN: [id number] Patient ID: Barnet PallKristen B Puller, female   DOB: 01/05/1986, 36 y.o.   MRN: [id number]

## 2022-08-15 NOTE — Progress Notes (Addendum)
D. Pt presents with a flat affect, continues to endorse intrusive thoughts of jumping off of her balcony. Pt reported that she didn't sleep well last night, that she was having racing thoughts about her baby. Per pt's self inventory, pt rated her depression,hopelessness and anxiety all 10/10. Pt reported that her goal was "to get the racing thoughts out of her head" and "to pray". Pt has been visible in the milieu, observed attending groups. Pt endorses passive SI, and agrees to contact staff before acting on any harmful thoughts.  A. Labs and vitals monitored. Pt given and educated on medications. Pt supported emotionally and encouraged to express concerns and ask questions.   R. Pt remains safe with 15 minute checks. Will continue POC.    08/15/22 1100  Psych Admission Type (Psych Patients Only)  Admission Status Voluntary  Psychosocial Assessment  Patient Complaints Anxiety;Depression;Hopelessness  Eye Contact Fair  Facial Expression Flat  Affect Flat  Speech Logical/coherent  Interaction Cautious  Motor Activity Slow  Appearance/Hygiene Unremarkable  Behavior Characteristics Cooperative;Calm  Mood Depressed  Aggressive Behavior  Effect No apparent injury  Thought Process  Coherency Circumstantial  Content WDL  Delusions None reported or observed  Perception WDL  Hallucination None reported or observed  Judgment Impaired  Confusion WDL  Danger to Self  Current suicidal ideation? Passive  Description of Suicide Plan plan to jump off balcony  Agreement Not to Harm Self Yes  Description of Agreement verbal contract for safety

## 2022-08-15 NOTE — [facility] Group Notes (Signed)
Pt attended wrap up group 

## 2022-08-15 NOTE — Progress Notes (Signed)
Pt continues to endorse suicidal thoughts and intrusive thoughts of harming her baby

## 2022-08-15 NOTE — Progress Notes (Addendum)
Completed wound care- small amount of serous drainage noted on old dressing. No signs of infection noted. Cleansed wound with sterile NS, lightly packed with Aquacel pad, covered with telfa pad and bandages.

## 2022-08-15 NOTE — Group Note (Signed)
LCSW Group Therapy Note  08/15/2022      Topic:  Anger Healthy and Unhealthy Coping Skills  Participation Level:  Minimal  Description of Group:   In this group, patients identified their own common triggers and typical reactions then analyzed how these reactions are possibly beneficial and possibly unhelpful.  Focus was placed on examining whether typical coping skills are healthy or unhealthy.  Therapeutic Goals: Patients will share situations that commonly incite their anger and how they typically respond Patients will identify how their coping skills work for them and/or against them Patients will explore possible alternative coping skills Patients will learn that anger itself is normal and that healthier reactions can assist with resolving conflict rather than worsening situations  Summary of Patient Progress:  The patient shared that her frequent cause of anger is when people don't listen.  Choice of coping skill is often to yell.  The patient mostly kept her eyes closed during group and did not interact.  Therapeutic Modalities:   Cognitive Behavioral Therapy Processing  Lynnell Chad, LCSW

## 2022-08-15 NOTE — Progress Notes (Signed)
   08/15/22 2300  Psych Admission Type (Psych Patients Only)  Admission Status Voluntary  Psychosocial Assessment  Patient Complaints Anxiety;Depression;Hopelessness  Eye Contact Fair  Facial Expression Flat  Affect Flat  Speech Logical/coherent  Interaction Cautious  Motor Activity Slow  Appearance/Hygiene Unremarkable  Behavior Characteristics Cooperative;Calm  Mood Depressed  Aggressive Behavior  Effect No apparent injury  Thought Process  Coherency Circumstantial  Content WDL  Delusions None reported or observed  Perception WDL  Hallucination None reported or observed  Judgment Impaired  Confusion WDL  Danger to Self  Current suicidal ideation? Passive  Description of Suicide Plan jump off balcony  Agreement Not to Harm Self Yes  Description of Agreement verbally contracts for safety  Danger to Others  Danger to Others None reported or observed

## 2022-08-15 NOTE — Progress Notes (Signed)
Pt rates depression 10/10 and anxiety 9/10. Pt reports a fair appetite, and no physical problems.wound care to sacral area performed during this shift no complaint of pain Pt denies SI/HI/AVH and verbally contracts for safety. Provided support and encouragement. Pt safe on the unit. Q 15 minute safety checks continued.

## 2022-08-16 DIAGNOSIS — F3160 Bipolar disorder, current episode mixed, unspecified: Secondary | ICD-10-CM | POA: Diagnosis not present

## 2022-08-16 MED ORDER — OLANZAPINE 10 MG PO TBDP
10.0000 mg | ORAL_TABLET | Freq: Once | ORAL | Status: AC
  Administered 2022-08-16: 10 mg via ORAL
  Filled 2022-08-16 (×2): qty 1

## 2022-08-16 NOTE — Progress Notes (Signed)
Patient at medication window, and this RN questioned about how her day was. Patient responded, "I have had better days, I have been having regressing thoughts about everything I have been going through today, and I have been sad. I have been sad that I have been having these thoughts about not only myself but about my kid." Patient reports having SI/HI. Patient contracts with safety. Patient complaint with PM medications.

## 2022-08-16 NOTE — Progress Notes (Addendum)
Completed wound care- small amount of purulent drainage noted on old dressing- Provider made aware. no redness or swelling noted- pt denied pain. Cleansed wound with sterile NS- packed lightly with Aquacel pad, covered with telfa pad and 2 large band aids.

## 2022-08-16 NOTE — Progress Notes (Addendum)
D. Pt continues to present flat-complaining of intrusive thoughts, and voicing concern over being discharged home while having harmful thoughts toward her baby. Pt continues to endorse poor sleep due to "racing thoughts". Pt endorses having a fair appetite, but has low energy and poor concentration. Per pt's self inventory, pt rated her depression,hopelessness and anxiety all 10/10. Pt has been visible in the milieu- very minimal interaction with others-, observed attending groups. Pt continues to endorse self harm thoughts, and thoughts of hurting her baby, but agrees to contact staff before acting on any harmful thoughts.  A. Labs and vitals monitored. Pt given and educated on medications. Pt supported emotionally and encouraged to express concerns and ask questions.   R. Pt remains safe with 15 minute checks. Will continue POC.

## 2022-08-16 NOTE — Progress Notes (Signed)
Adult Psychoeducational Group Note  Date:  08/16/2022 Time:  9:15 PM  Group Topic/Focus:  Wrap-Up Group:   The focus of this group is to help patients review their daily goal of treatment and discuss progress on daily workbooks.  Participation Level:  Active  Participation Quality:  Appropriate  Affect:  Depressed and Flat  Cognitive:  Appropriate  Insight: Appropriate  Engagement in Group:  Engaged  Modes of Intervention:  Discussion  Additional Comments:   Pt states that she had a bad day because she has been depressed and down all day. Pt refused to go into detail about her feelings but confirmed that she has notified her nurse. Pt anxiety and depression is more than usual. Writer notified pts nurse.   Vevelyn Pat 08/16/2022, 9:15 PM

## 2022-08-16 NOTE — Group Note (Signed)
[facility] LCSW Group Therapy Note  Date/Time:    08/16/2022   Type of Therapy and Topic:  Group Therapy:  Shame and its Impact on My Life  Participation Level:  None   Description of Group:  The focus of this group was to examine our tendency to be hyper-critical of self and how this leads to feelings of worthlessness, hopelessness, and shame.  Patients were guided to the concept that shame is universal and is worsened by being kept hidden, but improved by being revealed.  We discussed how feeling unworthy is the result of shame and discussed the differences between guilt  ("I did something bad" "I made a mistake" "I did something stupid") and shame ("I am bad" "I am a mistake" "I am stupid") .  We discussed that feelings are not necessarily based in facts.  We also talked about what happens when we do something to numb our negative feelings and how that actually numbs our positive feelings at the same time.  Therapeutic Goals Identify statements patients automatically say to themselves, "I'll be worthy when...."  Examine how this is unkind to ourselves because it indicates we cannot be worthy until some far-reaching, possibly even unreachable, goal is achieved. Talk about the frequent use of unhealthy coping skills used when feeling unworthy Practice 4 coping skills briefly (5 senses mindfulness, Camera Zoom In and Out, Grounding, 5-finger breathing) Allow patients to discuss their shame out loud in order to reduce its power over them  Summary of Patient Progress: During group, patient expressed that she could not think of an answer to "I'll be worthy when."   She closed her eyes and seemed to sleep through group.  Therapeutic Modalities Processing  Ambrose Mantle, LCSW 08/16/2022, 1:22 PM

## 2022-08-16 NOTE — Progress Notes (Signed)
[facility] MD Progress Note  08/16/2022 10:56 AM Kara Stephens  MRN:  [id number] Principal Problem: Bipolar 1 disorder, mixed (HCC) Diagnosis: Active Problems:   Asthma   Bipolar I disorder, most recent episode depressed, severe without psychotic features (HCC)   Anxiety   Insomnia   Essential hypertension  Reason For Admission: Kara Stephens is a 36 yo Caucasian female with a prior mental health history of bipolar d/o who presented to the [employer] [facility] urgent care [facility]) accompanied by her husband on 12/4 with complaints of worsening depressive symptoms with a plan to jump off their 2nd storey apartment building & intrusive thoughts to harm her 10 month old baby. Pt was transferred voluntarily to this [facility] for treatment and stabilization of her mood.    24 hr chart review: V/S for the past 24 hrs have been WNL with the exception of a slightly elevated HR of 101 earlier today morning. Pt is compliant with her scheduled medications, and only PRN medication taken was Tylenol 650mg  given for pain last night. There were no behavioral concerns overnight.  Patient assessment note (08/16/2022: Pt continues to present with a flat affect and depressed mood. Mood is slightly less depressed today as compared to yesterday. Her attention to personal hygiene and grooming is poor & the need to tend to personal hygiene and grooming needs have been reiterated. Eye contact is fair, speech is clear & coherent. Thought contents are organized and logical. Pt continues to verbalize suicidal ideations with a plan to jump off the balcony of her 2nd floor apartment. She verbally contracts for safety here at [facility]. She continues to report intrusive thoughts of wanting to drop her 56 month old baby over the balcony of her second floor apartment.   Pt continues to report that she is "not sleeping at all" at night, and did not sleep last night, even though nursing is reporting that she is getting some  sleep. Pt is most likely sleeping for some hours. She was educated last night to left start know during Q15 minute checks at night if not sleeping, but did not. Staff report that during safety checks last night, she was still, and not moving.  We will consider med changes tomorrow and consider changing Seroquel to another mood stabilizer if there is still no improvement in pt's presentation. Considerations made today to add an SSRI to pt's medication regimen, but there are concerns of throwing her into mania. Abilify considered, but pt reports that she reacted negatively to it in the past. She is less catatonic on the Ativan 2 mg PO QID, but there remains some psychomotor retardation. We will continue Ativan along with other medications as listed below. No TD/EPS type symptoms found on assessment, and pt denies any feelings of stiffness. AIMS: 0.   Total Time spent with patient: 45 minutes  Past Psychiatric History: Bipolar d/o  Past Medical History:  Past Medical History:  Diagnosis Date   Anxiety    Back spasm    Bipolar I disorder (HCC)    Hypertension    Tachycardia     Past Surgical History:  Procedure Laterality Date   APPENDECTOMY     CHOLECYSTECTOMY     HAND SURGERY     SHOULDER SURGERY     Family History:  Family History  Problem Relation Age of Onset   Hypertension Mother    Hyperlipidemia Mother    Suicidality Father    Depression Father    Family Psychiatric  History: See above Social History:  Social History   Substance and Sexual Activity  Alcohol Use No     Social History   Substance and Sexual Activity  Drug Use No    Social History   Socioeconomic History   Marital status: Married    Spouse name: Not on file   Number of children: Not on file   Years of education: Not on file   Highest education level: Not on file  Occupational History   Not on file  Tobacco Use   Smoking status: Former    Types: Cigarettes    Quit date: 09/10/2016    Years since  quitting: 5.9   Smokeless tobacco: Never  Vaping Use   Vaping Use: Never used  Substance and Sexual Activity   Alcohol use: No   Drug use: No   Sexual activity: Not on file  Other Topics Concern   Not on file  Social History Narrative   Not on file   Social Determinants of Health   Financial Resource Strain: Not on file  Food Insecurity: No Food Insecurity (08/11/2022)   Hunger Vital Sign    Worried About Running Out of Food in the Last Year: Never true    Ran Out of Food in the Last Year: Never true  Transportation Needs: No Transportation Needs (08/11/2022)   PRAPARE - Administrator, Civil ServiceTransportation    Lack of Transportation (Medical): No    Lack of Transportation (Non-Medical): No  Physical Activity: Not on file  Stress: Not on file  Social Connections: Not on file   Additional Social History:   Sleep: Poor  Appetite:  Fair  Current Medications: Current Facility-Administered Medications  Medication Dose Route Frequency Provider Last Rate Last Admin   acetaminophen (TYLENOL) tablet 650 mg  650 mg Oral Q6H PRN Park PopeJi, Andrew, MD   650 mg at 08/15/22 2120   albuterol (VENTOLIN HFA) 108 (90 Base) MCG/ACT inhaler 2 puff  2 puff Inhalation Q4H PRN Park PopeJi, Andrew, MD       alum & mag hydroxide-simeth (MAALOX/MYLANTA) 200-200-20 MG/5ML suspension 30 mL  30 mL Oral Q4H PRN Park PopeJi, Andrew, MD       docusate sodium (COLACE) capsule 100 mg  100 mg Oral BID Massengill, Harrold DonathNathan, MD   100 mg at 08/16/22 0811   feeding supplement (ENSURE ENLIVE / ENSURE PLUS) liquid 237 mL  237 mL Oral TID BM Wilbon Obenchain, NP   237 mL at 08/16/22 0813   hydrOXYzine (ATARAX) tablet 25 mg  25 mg Oral TID PRN Park PopeJi, Andrew, MD   25 mg at 08/12/22 [id number]   lamoTRIgine (LAMICTAL) tablet 200 mg  200 mg Oral Daily Massengill, Harrold DonathNathan, MD   200 mg at 08/16/22 [id number]   LORazepam (ATIVAN) tablet 2 mg  2 mg Oral QID Massengill, Harrold DonathNathan, MD   2 mg at 08/16/22 [id number]   magnesium hydroxide (MILK OF MAGNESIA) suspension 30 mL  30 mL Oral Daily PRN Park PopeJi,  Andrew, MD       melatonin tablet 5 mg  5 mg Oral QHS Massengill, Harrold DonathNathan, MD   5 mg at 08/15/22 2033   NIFEdipine (ADALAT CC) 24 hr tablet 90 mg  90 mg Oral Seabron SpatesQHS Ji, Andrew, MD   90 mg at 08/15/22 2032   propranolol (INDERAL) tablet 10 mg  10 mg Oral Q8H Massengill, Harrold DonathNathan, MD   10 mg at 08/16/22 [id number]   QUEtiapine (SEROQUEL) tablet 100 mg  100 mg Oral QHS PRN Phineas InchesMassengill, Nathan, MD   100 mg  at 08/13/22 2249   QUEtiapine (SEROQUEL) tablet 300 mg  300 mg Oral QHS Massengill, Nathan, MD   300 mg at 08/15/22 2033   traZODone (DESYREL) tablet 50 mg  50 mg Oral QHS Massengill, [name], MD   50 mg at 08/15/22 2034   traZODone (DESYREL) tablet 50 mg  50 mg Oral QHS PRN Massengill, [name], MD       Lab Results:  No results found for this or any previous visit (from the past 48 hour(s)).  Blood Alcohol level:  Lab Results  Component Value Date   ETH <10 08/10/2022   Metabolic Disorder Labs: Lab Results  Component Value Date   HGBA1C 5.8 (H) 08/10/2022   MPG 120 08/10/2022   No results found for: "PROLACTIN" Lab Results  Component Value Date   CHOL 202 (H) 08/12/2022   TRIG 154 (H) 08/12/2022   HDL 50 08/12/2022   CHOLHDL 4.0 08/12/2022   VLDL 31 08/12/2022   LDLCALC 121 (H) 08/12/2022   Physical Findings: AIMS: 0 CIWA: n/a COWS:n/a  Musculoskeletal: Strength & Muscle Tone: within normal limits Gait & Station: normal Patient leans: N/A  Psychiatric Specialty Exam:  Presentation  General Appearance:  Appropriate for Environment  Eye Contact: Fair  Speech: Clear and Coherent  Speech Volume: Normal  Handedness: Right  Mood and Affect  Mood: Depressed  Affect: Congruent  Thought Process  Thought Processes: Coherent  Descriptions of Associations:Intact  Orientation:Full (Time, Place and Person)  Thought Content:Logical  History of Schizophrenia/Schizoaffective disorder:No  Duration of Psychotic Symptoms:No data recorded Hallucinations:Hallucinations:  None  Ideas of Reference:None  Suicidal Thoughts:Suicidal Thoughts: Yes, Active SI Active Intent and/or Plan: Without Intent; With Plan (CFS on unit)  Homicidal Thoughts:Homicidal Thoughts: No HI Active Intent and/or Plan: Without Intent (intrusive thoughts to drop her 61 month old baby off balcony of second floor) HI Passive Intent and/or Plan: Without Intent (intrusive thoughts to drop baby off balcony of second floor apartment)   Sensorium  Memory: Immediate Good  Judgment: Poor  Insight: Poor  Executive Functions  Concentration: Fair  Attention Span: Fair  Recall: [employer] of Knowledge: Fair  Language: Fair  Psychomotor Activity  Psychomotor Activity: Psychomotor Activity: Normal  Assets  Assets: Manufacturing systems engineer; Housing; Social Support  Sleep  Sleep: Sleep: Poor  Physical Exam: Physical Exam Constitutional:      Appearance: Normal appearance.  HENT:     Head: Normocephalic.     Nose: Nose normal. No congestion or rhinorrhea.  Eyes:     Pupils: Pupils are equal, round, and reactive to light.  Pulmonary:     Effort: Pulmonary effort is normal.  Musculoskeletal:     Cervical back: Normal range of motion.  Neurological:     Mental Status: She is alert and oriented to person, place, and time.    Review of Systems  Constitutional:  Negative for chills and fever.  HENT: Negative.  Negative for hearing loss.   Eyes: Negative.  Negative for blurred vision.  Respiratory: Negative.  Negative for cough.   Cardiovascular: Negative.  Negative for chest pain.  Gastrointestinal: Negative.   Genitourinary: Negative.   Musculoskeletal: Negative.   Skin: Negative.  Negative for rash.  Neurological: Negative.  Negative for dizziness and headaches.  Psychiatric/Behavioral:  Positive for depression and suicidal ideas. Negative for hallucinations, memory loss and substance abuse. The patient is nervous/anxious and has insomnia.    Blood pressure  98/64, pulse (!) 101, temperature 98.5 F (36.9 C), temperature source Oral, resp.  rate 16, height 5\' 5"  (1.651 m), weight 91.6 kg, SpO2 95 %. Body mass index is 33.61 kg/m.  Treatment Plan Summary: Daily contact with patient to assess and evaluate symptoms and progress in treatment and Medication management   Observation Level/Precautions:  15 minute checks  Laboratory:  Labs reviewed   Psychotherapy:  Unit Group sessions  Medications:  See [facility]  Consultations:  To be determined   Discharge Concerns:  Safety, medication compliance, mood stability  Estimated LOS: 5-7 days  Other:  N/A    Labs Reviewed independently on 08/13/2022:  CMP WNL, Lipid panel reviewed (cholesterol 202, LDL-121, Triglycerides 154-educated on healthy food choices and exercise. HA1C is 5.8 rendering pt a prediabetic-will need PCP f/u after dc. TSH WNL. UA hazy with rare bacteria otherwise WNL. EKG from 12/04 with QTC-445.    PLAN Safety and Monitoring: Voluntary admission to inpatient psychiatric unit for safety, stabilization and treatment Daily contact with patient to assess and evaluate symptoms and progress in treatment Patient's case to be discussed in multi-disciplinary team meeting Observation Level : q15 minute checks Vital signs: q12 hours Precautions: Safety   Long Term Goal(s): Improvement in symptoms so as ready for discharge   Short Term Goals: Ability to identify changes in lifestyle to reduce recurrence of condition will improve, Ability to verbalize feelings will improve, Ability to disclose and discuss suicidal ideas, Ability to demonstrate self-control will improve, Ability to identify and develop effective coping behaviors will improve, and Compliance with prescribed medications will improve   Diagnoses:  Active Problems:   Asthma   Bipolar I disorder, most recent episode depressed, severe without psychotic features (HCC)   Anxiety   Insomnia   Essential hypertension    Medications -Continue Ensure Nutritional shakes TID in between meals for nutritional support -Continue Trazodone 50 mg nightly and 50 mg PRN for insomnia -Continue Lamictal 200 mg daily for mood stabilization (home med) -Continue Ativan 1 mg QID for catatonia (PLEASE DO NOT DISCONTINUE THIS ORDER) -Continue Inderal 10 mg Q 8 H for anxiety (home med) -Continue Seroquel to 300 mg nightly for mood stabilization (home med-was 25 mg QHS) -Continue Seroquel 100 mg at bedtime PRN for sleep/anxiety -Continue Nifedipine 90 mg Q HS for hypertension (home med) -Continue Hydroxyzine 25 mg every 6 hours PRN -Continue Albuterol PRN Q 4 H for wheezing/SOB -Given Ativan 2 mg IM x 0ne dose for catatonia on 12/06-completed with positive response  noted an hour after medication administration (smiling and talking more coherently)  Other PRNS -Continue Tylenol 650 mg every 6 hours PRN for mild pain -Continue Maalox 30 mg every 4 hrs PRN for indigestion -Continue Milk of Magnesia as needed every 6 hrs for constipation   Discharge Planning: Social work and case management to assist with discharge planning and identification of hospital follow-up needs prior to discharge Estimated LOS: 5-7 days Discharge Concerns: Need to establish a safety plan; Medication compliance and effectiveness Discharge Goals: Return home with outpatient referrals for mental health follow-up including medication management/psychotherapy   14/04, NP 08/16/2022, 10:56 AMPatient ID: 14/06/2022, female   DOB: 12-20-1985, 36 y.o.   MRN: [id number] Patient ID: SHERIE DOBROWOLSKI, female   DOB: June 23, 1986, 36 y.o.   MRN: [id number] Patient ID: SUDIE BANDEL, female   DOB: [birth year], 36 y.o.   MRN: [id number]

## 2022-08-16 NOTE — Progress Notes (Signed)
   08/16/22 2300  Psych Admission Type (Psych Patients Only)  Admission Status Voluntary  Psychosocial Assessment  Patient Complaints Anxiety;Sadness;Hopelessness  Eye Contact Fair  Facial Expression Flat;Sad  Affect Sad;Flat  Speech Logical/coherent  Interaction Cautious  Motor Activity Slow  Appearance/Hygiene Unremarkable  Behavior Characteristics Appropriate to situation;Cooperative;Calm  Mood Sad;Depressed  Thought Process  Coherency Circumstantial  Content WDL  Delusions None reported or observed  Perception WDL  Hallucination None reported or observed  Judgment Poor  Confusion WDL  Danger to Self  Current suicidal ideation? Passive  Description of Suicide Plan "if i were at home, i stay on the 2nd floor, that would be my best option"  Self-Injurious Behavior Some self-injurious ideation observed or expressed.  No lethal plan expressed   Agreement Not to Harm Self Yes  Description of Agreement VERBAL  Danger to Others  Danger to Others None reported or observed

## 2022-08-17 ENCOUNTER — Encounter (HOSPITAL_COMMUNITY): Payer: Self-pay

## 2022-08-17 DIAGNOSIS — F3160 Bipolar disorder, current episode mixed, unspecified: Secondary | ICD-10-CM | POA: Diagnosis not present

## 2022-08-17 MED ORDER — PROPRANOLOL HCL 20 MG PO TABS
20.0000 mg | ORAL_TABLET | Freq: Three times a day (TID) | ORAL | Status: AC
  Administered 2022-08-17 (×2): 20 mg via ORAL
  Filled 2022-08-17 (×2): qty 1

## 2022-08-17 MED ORDER — LORAZEPAM 0.5 MG PO TABS
1.5000 mg | ORAL_TABLET | Freq: Four times a day (QID) | ORAL | Status: DC
  Administered 2022-08-17 – 2022-08-18 (×4): 1.5 mg via ORAL
  Filled 2022-08-17 (×4): qty 3

## 2022-08-17 MED ORDER — FLUOXETINE HCL 20 MG PO CAPS
20.0000 mg | ORAL_CAPSULE | Freq: Every day | ORAL | Status: DC
  Administered 2022-08-19: 20 mg via ORAL
  Filled 2022-08-17 (×2): qty 1

## 2022-08-17 MED ORDER — FLUOXETINE HCL 10 MG PO CAPS
10.0000 mg | ORAL_CAPSULE | Freq: Every day | ORAL | Status: AC
  Administered 2022-08-18: 10 mg via ORAL
  Filled 2022-08-17: qty 1

## 2022-08-17 MED ORDER — PROPRANOLOL HCL ER 60 MG PO CP24
60.0000 mg | ORAL_CAPSULE | Freq: Every day | ORAL | Status: DC
  Administered 2022-08-18 – 2022-09-04 (×18): 60 mg via ORAL
  Filled 2022-08-17 (×20): qty 1

## 2022-08-17 MED ORDER — OLANZAPINE 10 MG PO TBDP
10.0000 mg | ORAL_TABLET | Freq: Every day | ORAL | Status: DC
  Administered 2022-08-18 – 2022-08-21 (×4): 10 mg via ORAL
  Filled 2022-08-17 (×9): qty 1

## 2022-08-17 MED ORDER — METFORMIN HCL ER 500 MG PO TB24
500.0000 mg | ORAL_TABLET | Freq: Every day | ORAL | Status: DC
  Administered 2022-08-18 – 2022-09-04 (×18): 500 mg via ORAL
  Filled 2022-08-17 (×20): qty 1

## 2022-08-17 NOTE — [facility] Group Notes (Signed)
Spiritual care group on grief and loss facilitated by Chaplain Dyanne Carrel, BCC, Chaplain Genesis Adams and Rene Paci, couseling intern.    Group Goal:  Support / Education around grief and loss  Members engage in facilitated group support and psycho-social education.  Group Description:  Following introductions and group rules, group members engaged in facilitated group dialog and support around topic of loss, with particular support around experiences of loss in their lives. Group Identified types of loss (relationships / self / things) and identified patterns, circumstances, and changes that precipitate losses. Reflected on thoughts / feelings around loss, normalized grief responses, and recognized variety in grief experience.   Group drew on Adlerian / Rogerian, narrative, MI,  Patient Progress: Kara Stephens attended group and participated in group activities, though did not participate verbally.  Her affect was very flat and chaplain checked in after group and [name] stated she was having a hard day.  Chaplain will follow up tomorrow as able.  [address] Bcc Pager, [phone]

## 2022-08-17 NOTE — Group Note (Signed)
Recreation Therapy Group Note   Group Topic:Stress Management  Group Date: 08/17/2022 Start Time: 0930 End Time: 0950 Facilitators: Paulena Servais-McCall, LRT,CTRS Location: [address] Dayroom   Goal Area(s) Addresses:  Patient will identify positive stress management techniques. Patient will identify benefits of using stress management post d/c.     Group Description:  Meditation.  LRT discussed meditation with patients and explained to them to focus on their breathing.  LRT then played a meditation that focused on waking up to a new day and making the most of it.  Patients were to focus on inviting good feelings, peace and bliss.  Patients were to engage and follow along as meditation played to get the full experience of the meditation.   Affect/Mood: N/A   Participation Level: Did not attend    Clinical Observations/Individualized Feedback:      Plan: Continue to engage patient in RT group sessions 2-3x/week.   Aseret Hoffman-McCall, LRT,CTRS 08/17/2022 11:46 AM

## 2022-08-17 NOTE — Plan of Care (Signed)
  Problem: Activity: Goal: Interest or engagement in activities will improve Outcome: Progressing Goal: Sleeping patterns will improve Outcome: Progressing   Problem: Education: Goal: Knowledge of [place] [facility] Education information/materials will improve Outcome: Progressing Goal: Emotional status will improve Outcome: Progressing Goal: Mental status will improve Outcome: Progressing Goal: Verbalization of understanding the information provided will improve Outcome: Progressing   

## 2022-08-17 NOTE — Progress Notes (Signed)
Patient reports to the nursing station reporting that she is worried about starting the Zyprexa. Patient reports that she was on Zyprexa x4 months prior to arriving here and she feels that it made her manic and her intrusive thoughts worse. Patient states that because she is actively having these intrusive thoughts she doesn't want  to take the Zyprexa because she fears it may make the thoughts worse and she may become manic. Patient educated that Zyprexa was ordered due to the her active intrusive thoughts. NP to be consulted.

## 2022-08-17 NOTE — Progress Notes (Signed)
Patient is reluctant to take the zyprexa, she states to taking zyprexa after she had a baby, and it put her in hypomanic state. Her husband also called to explain how zyprexa made her condition worse. This Clinical research associate encouraged patient to take medication as staff will monitor the effect of medication since she is still here in the hospital.

## 2022-08-17 NOTE — Group Note (Signed)
Occupational Therapy Group Note  Group Topic:Coping Skills  Group Date: 08/17/2022 Start Time: 1430 End Time: 1510 Facilitators: Presten Joost G, OT   Group Description: Group encouraged increased engagement and participation through discussion and activity focused on "Coping Ahead." Patients were split up into teams and selected a card from a stack of positive coping strategies. Patients were instructed to act out/charade the coping skill for other peers to guess and receive points for their team. Discussion followed with a focus on identifying additional positive coping strategies and patients shared how they were going to cope ahead over the weekend while continuing hospitalization stay.  Therapeutic Goal(s): Identify positive vs negative coping strategies. Identify coping skills to be used during hospitalization vs coping skills outside of hospital/at home Increase participation in therapeutic group environment and promote engagement in treatment   Participation Level: Active   Participation Quality: Independent   Behavior: Appropriate   Speech/Thought Process: Relevant   Affect/Mood: Appropriate   Insight: Fair   Judgement: Fair   Individualization: pt was active in their participation of group discussion/activity. New skills identified  Modes of Intervention: Discussion  Patient Response to Interventions:  Attentive   Plan: Continue to engage patient in OT groups 2 - 3x/week.  08/17/2022  Odessie Polzin G Harkirat Orozco, OT  Joylene Wescott, OT  

## 2022-08-17 NOTE — B[facility] Group Notes (Signed)
Pt attended A/A 

## 2022-08-17 NOTE — Progress Notes (Signed)
[facility] MD Progress Note  08/17/2022 2:39 PM Kara Stephens  MRN:  [id number] Principal Problem: Bipolar 1 disorder, mixed (HCC) Diagnosis: Active Problems:   Asthma   Bipolar I disorder, most recent episode depressed, severe without psychotic features (HCC)   Anxiety   Insomnia   Essential hypertension  Reason For Admission: Kara Stephens is a 36 yo Caucasian female with a prior mental health history of bipolar d/o who presented to the [facility] urgent care [facility]) accompanied by her husband on 12/4 with complaints of worsening depressive symptoms with a plan to jump off their [address] storey apartment building & intrusive thoughts to harm her 86 month old baby. Pt was transferred voluntarily to this [facility] for treatment and stabilization of her mood.    24 hr chart review: BP earlier today morning elevated at 115. Nursing asked to recheck. As per nursing flow sheets, pt slept for a total of 5 hours last night, and has been compliant with her scheduled medications. She last required a PRN medication yesterday when Zyprexa 10 mg was given in the afternoon for racing thoughts.  Patient assessment note (08/17/2022: Today, pt presents with a slightly less depressed mood than yesterday. She continues to report intrusive thoughts of dropping her 4 month child over the balcony at her 2nd floor apartment, and also endorses +SI with a plan to jump off the balcony. She states that she does not want to do either of these. She is also verbally contracting for safety while at this [facility], and states that she will come to staff if the thoughts get unbearable. She denies AVH/HI, denies paranoia and denies delusional thinking.  Pt is reporting that she is not sleeping, but as per nursing documentation and flowsheets, she has been getting between 4-6 hrs of sleep every night for the past couple days. Pt's husband reported a few days ago that pt typically states that she is not sleeping whenever  she is depressed, but gets 4-5 hrs nightly during those times. Pt reports that her appetite is back to being normal, and she is eating well. She is rating her depression today as 10 (10 being worst). She rates her anxiety as 10 (10 being worst). She denies being in any physical distress. Pt has been educated on the need to accurately report her symptoms to the treatment team so that her symptoms can be accurately managed as well. Based on subjective assessments, pt has a less depressed mood, and is less anxious as compared to at time of admission. Improvement in mood with current medications, is however, slow. We will discontinue Seroquel 200 mg due to lack of efficacy, and start Zyprexa 10 mg nightly along with Prozac 10 mg daily for mood stabilization. We are continuing other medications as listed below.  No TD/EPS type symptoms found on assessment, and pt denies any feelings of stiffness. AIMS: 0.   Total Time spent with patient: 45 minutes  Past Psychiatric History: Bipolar d/o  Past Medical History:  Past Medical History:  Diagnosis Date   Anxiety    Back spasm    Bipolar I disorder (HCC)    Hypertension    Tachycardia     Past Surgical History:  Procedure Laterality Date   APPENDECTOMY     CHOLECYSTECTOMY     HAND SURGERY     SHOULDER SURGERY     Family History:  Family History  Problem Relation Age of Onset   Hypertension Mother    Hyperlipidemia Mother  Suicidality Father    Depression Father    Family Psychiatric  History: See above Social History:  Social History   Substance and Sexual Activity  Alcohol Use No     Social History   Substance and Sexual Activity  Drug Use No    Social History   Socioeconomic History   Marital status: Married    Spouse name: Not on file   Number of children: Not on file   Years of education: Not on file   Highest education level: Not on file  Occupational History   Not on file  Tobacco Use   Smoking status: Former     Types: Cigarettes    Quit date: 09/10/2016    Years since quitting: 5.9   Smokeless tobacco: Never  Vaping Use   Vaping Use: Never used  Substance and Sexual Activity   Alcohol use: No   Drug use: No   Sexual activity: Not on file  Other Topics Concern   Not on file  Social History Narrative   Not on file   Social Determinants of Health   Financial Resource Strain: Not on file  Food Insecurity: No Food Insecurity (08/11/2022)   Hunger Vital Sign    Worried About Running Out of Food in the Last Year: Never true    Ran Out of Food in the Last Year: Never true  Transportation Needs: No Transportation Needs (08/11/2022)   PRAPARE - Administrator, Civil ServiceTransportation    Lack of Transportation (Medical): No    Lack of Transportation (Non-Medical): No  Physical Activity: Not on file  Stress: Not on file  Social Connections: Not on file   Additional Social History:   Sleep: Poor  Appetite:  Fair  Current Medications: Current Facility-Administered Medications  Medication Dose Route Frequency Provider Last Rate Last Admin   acetaminophen (TYLENOL) tablet 650 mg  650 mg Oral Q6H PRN Park PopeJi, Andrew, MD   650 mg at 08/15/22 2120   albuterol (VENTOLIN HFA) 108 (90 Base) MCG/ACT inhaler 2 puff  2 puff Inhalation Q4H PRN Park PopeJi, Andrew, MD       alum & mag hydroxide-simeth (MAALOX/MYLANTA) 200-200-20 MG/5ML suspension 30 mL  30 mL Oral Q4H PRN Park PopeJi, Andrew, MD       docusate sodium (COLACE) capsule 100 mg  100 mg Oral BID Massengill, Harrold DonathNathan, MD   100 mg at 08/16/22 1829   feeding supplement (ENSURE ENLIVE / ENSURE PLUS) liquid 237 mL  237 mL Oral TID BM [place] Yepiz, NP   237 mL at 08/17/22 1400   [START ON 08/18/2022] FLUoxetine (PROZAC) capsule 10 mg  10 mg Oral Daily Massengill, Nathan, MD       Followed by   Melene Muller[START ON 08/19/2022] FLUoxetine (PROZAC) capsule 20 mg  20 mg Oral Daily Massengill, Nathan, MD       hydrOXYzine (ATARAX) tablet 25 mg  25 mg Oral TID PRN Park PopeJi, Andrew, MD   25 mg at 08/12/22 0648    lamoTRIgine (LAMICTAL) tablet 200 mg  200 mg Oral Daily Massengill, Harrold DonathNathan, MD   200 mg at 08/17/22 0759   LORazepam (ATIVAN) tablet 1.5 mg  1.5 mg Oral QID Massengill, Harrold DonathNathan, MD       magnesium hydroxide (MILK OF MAGNESIA) suspension 30 mL  30 mL Oral Daily PRN Park PopeJi, Andrew, MD       melatonin tablet 5 mg  5 mg Oral QHS Massengill, Harrold DonathNathan, MD   5 mg at 08/16/22 2107   [START ON 08/18/2022] metFORMIN (GLUCOPHAGE-XR) 24 hr  tablet 500 mg  500 mg Oral Q breakfast Massengill, Nathan, MD [name]   NIFEdipine (ADALAT CC) 24 hr tablet 90 mg  90 mg Oral [name], MD   90 mg at 08/16/22 2107   OLANZapine zydis (ZYPREXA) disintegrating tablet 10 mg  10 mg Oral QHS Massengill, [name], MD       propranolol (INDERAL) tablet 20 mg  20 mg Oral Q8H Massengill, Nathan, MD   20 mg at 08/17/22 1359   [START ON 08/18/2022] propranolol ER (INDERAL LA) 24 hr capsule 60 mg  60 mg Oral Daily Massengill, Nathan, MD       traZODone (DESYREL) tablet 50 mg  50 mg Oral QHS PRN Massengill, [name], MD       Lab Results:  No results found for this or any previous visit (from the past 48 hour(s)).  Blood Alcohol level:  Lab Results  Component Value Date   ETH <10 08/10/2022   Metabolic Disorder Labs: Lab Results  Component Value Date   HGBA1C 5.8 (H) 08/10/2022   MPG 120 08/10/2022   No results found for: "PROLACTIN" Lab Results  Component Value Date   CHOL 202 (H) 08/12/2022   TRIG 154 (H) 08/12/2022   HDL 50 08/12/2022   CHOLHDL 4.0 08/12/2022   VLDL 31 08/12/2022   LDLCALC 121 (H) 08/12/2022   Physical Findings: AIMS: 0 CIWA: n/a COWS:n/a  Musculoskeletal: Strength & Muscle Tone: within normal limits Gait & Station: normal Patient leans: N/A  Psychiatric Specialty Exam:  Presentation  General Appearance:  Disheveled  Eye Contact: Fair  Speech: Clear and Coherent  Speech Volume: Normal  Handedness: Right  Mood and Affect  Mood: Depressed  Affect: Congruent  Thought Process   Thought Processes: Coherent  Descriptions of Associations:Intact  Orientation:Full (Time, Place and Person)  Thought Content:Logical  History of Schizophrenia/Schizoaffective disorder:No  Duration of Psychotic Symptoms:No data recorded Hallucinations:Hallucinations: None  Ideas of Reference:None  Suicidal Thoughts:Suicidal Thoughts: Yes, Active SI Active Intent and/or Plan: Without Intent; With Plan  Homicidal Thoughts:Homicidal Thoughts: Yes, Active HI Active Intent and/or Plan: Without Intent; With Plan HI Passive Intent and/or Plan: Without Intent (intrusive thoughts to drop baby off balcony of second floor apartment)   Sensorium  Memory: Immediate Good  Judgment: Fair  Insight: Fair  Art therapist  Concentration: Fair  Attention Span: Fair  Recall: Fair  Fund of Knowledge: Fair  Language: Fair  Psychomotor Activity  Psychomotor Activity: Psychomotor Activity: Normal  Assets  Assets: Resilience  Sleep  Sleep: Sleep: Fair  Physical Exam: Physical Exam Constitutional:      Appearance: Normal appearance.  HENT:     Head: Normocephalic.     Nose: Nose normal. No congestion or rhinorrhea.  Eyes:     Pupils: Pupils are equal, round, and reactive to light.  Pulmonary:     Effort: Pulmonary effort is normal.  Musculoskeletal:     Cervical back: Normal range of motion.  Neurological:     Mental Status: She is alert and oriented to person, place, and time.    Review of Systems  Constitutional:  Negative for chills and fever.  HENT: Negative.  Negative for hearing loss.   Eyes: Negative.  Negative for blurred vision.  Respiratory: Negative.  Negative for cough.   Cardiovascular: Negative.  Negative for chest pain.  Gastrointestinal: Negative.   Genitourinary: Negative.   Musculoskeletal: Negative.   Skin: Negative.  Negative for rash.  Neurological: Negative.  Negative for dizziness and headaches.  Psychiatric/Behavioral:   Positive for depression and suicidal ideas. Negative for hallucinations, memory loss and substance abuse. The patient is nervous/anxious and has insomnia.    Blood pressure 114/74, pulse (!) 115, temperature 99.1 F (37.3 C), temperature source Oral, resp. rate 16, height 5\' 5"  (1.651 m), weight 91.6 kg, SpO2 95 %. Body mass index is 33.61 kg/m.  Treatment Plan Summary: Daily contact with patient to assess and evaluate symptoms and progress in treatment and Medication management   Observation Level/Precautions:  15 minute checks  Laboratory:  Labs reviewed   Psychotherapy:  Unit Group sessions  Medications:  See [facility]  Consultations:  To be determined   Discharge Concerns:  Safety, medication compliance, mood stability  Estimated LOS: 5-7 days  Other:  N/A    Labs Reviewed independently on 08/13/2022:  CMP WNL, Lipid panel reviewed (cholesterol 202, LDL-121, Triglycerides 154-educated on healthy food choices and exercise. HA1C is 5.8 rendering pt a prediabetic-will need PCP f/u after dc. TSH WNL. UA hazy with rare bacteria otherwise WNL. EKG from 12/04 with QTC-445.    PLAN Safety and Monitoring: Voluntary admission to inpatient psychiatric unit for safety, stabilization and treatment Daily contact with patient to assess and evaluate symptoms and progress in treatment Patient's case to be discussed in multi-disciplinary team meeting Observation Level : q15 minute checks Vital signs: q12 hours Precautions: Safety   Long Term Goal(s): Improvement in symptoms so as ready for discharge   Short Term Goals: Ability to identify changes in lifestyle to reduce recurrence of condition will improve, Ability to verbalize feelings will improve, Ability to disclose and discuss suicidal ideas, Ability to demonstrate self-control will improve, Ability to identify and develop effective coping behaviors will improve, and Compliance with prescribed medications will improve   Diagnoses:  Active  Problems:   Asthma   Bipolar I disorder, most recent episode depressed, severe without psychotic features (HCC)   Anxiety   Insomnia   Essential hypertension   Medications -Start Prozac 10 mg on 08/18/22 and increase to 20 mg daily on 08/19/22 -Start Zyprexa 10 mg nightly for mood stabilization -Continue Lamictal 200 mg daily for mood stabilization (home med) -Decrease Ativan to 1.5 mg QID for catatonia (PLEASE DO NOT DISCONTINUE THIS ORDER) -Continue Inderal 20 mg Q 8 H for anxiety (home med) -Discontinue Seroquel 300 mg nightly d/t lack of efficacy -Discontinue Seroquel 100 mg at bedtime PRN -Continue Nifedipine 90 mg Q HS for hypertension (home med) -Continue Hydroxyzine 25 mg every 6 hours PRN -Continue Albuterol PRN Q 4 H for wheezing/SOB -Given Ativan 2 mg IM x 0ne dose for catatonia on 12/06-completed with positive response  noted an hour after medication administration (smiling and talking more coherently) -Continue Ensure Nutritional shakes TID in between meals for nutritional support -Continue Trazodone 50 mg nightly and 50 mg PRN for insomnia  Other PRNS -Continue Tylenol 650 mg every 6 hours PRN for mild pain -Continue Maalox 30 mg every 4 hrs PRN for indigestion -Continue Milk of Magnesia as needed every 6 hrs for constipation   Discharge Planning: Social work and case management to assist with discharge planning and identification of hospital follow-up needs prior to discharge Estimated LOS: 5-7 days Discharge Concerns: Need to establish a safety plan; Medication compliance and effectiveness Discharge Goals: Return home with outpatient referrals for mental health follow-up including medication management/psychotherapy   08/21/22, NP 08/17/2022, 2:39 PMPatient ID: 14/07/2022, female   DOB: Nov 19, 1985, 36 y.o.   MRN: [id number] Patient ID: Kara  Sheppard Stephens, female   DOB: 03/25/1986, 28 y.o.   MRN: [id number] Patient ID: Kara Stephens, female   DOB: [birth year], 37  y.o.   MRN: [id number] Patient ID: Kara Stephens, female   DOB: Mar 12, 1986, 36 y.o.   MRN: [id number]

## 2022-08-17 NOTE — Progress Notes (Signed)
   08/17/22 0800  Psych Admission Type (Psych Patients Only)  Admission Status Voluntary  Psychosocial Assessment  Patient Complaints Anxiety;Sadness  Eye Contact Fair  Facial Expression Flat;Sad  Affect Sad;Flat  Speech Logical/coherent  Interaction Cautious  Motor Activity Slow  Appearance/Hygiene Unremarkable  Behavior Characteristics Appropriate to situation  Mood Sad;Depressed  Aggressive Behavior  Effect No apparent injury  Thought Process  Coherency Circumstantial  Content WDL  Delusions None reported or observed  Perception WDL  Hallucination None reported or observed  Judgment Poor  Confusion WDL  Danger to Self  Current suicidal ideation? Passive  Agreement Not to Harm Self Yes  Description of Agreement verbal  Danger to Others  Danger to Others None reported or observed

## 2022-08-17 NOTE — Progress Notes (Signed)
[place] NP notified of patient's concerns.

## 2022-08-17 NOTE — BH IP Treatment Plan (Signed)
Interdisciplinary Treatment and Diagnostic Plan Update  08/17/2022 Time of Session: 8:30am, update Kara Stephens MRN: [id number]  Principal Diagnosis: Bipolar 1 disorder, mixed (HCC)  Secondary Diagnoses: Active Problems:   Asthma   Bipolar I disorder, most recent episode depressed, severe without psychotic features (HCC)   Anxiety   Insomnia   Essential hypertension   Current Medications:  Current Facility-Administered Medications  Medication Dose Route Frequency Provider Last Rate Last Admin   acetaminophen (TYLENOL) tablet 650 mg  650 mg Oral Q6H PRN [name], MD   650 mg at 08/15/22 2120   albuterol (VENTOLIN HFA) 108 (90 Base) MCG/ACT inhaler 2 puff  2 puff Inhalation Q4H PRN [name], MD       alum & mag hydroxide-simeth (MAALOX/MYLANTA) 200-200-20 MG/5ML suspension 30 mL  30 mL Oral Q4H PRN [name], MD       docusate sodium (COLACE) capsule 100 mg  100 mg Oral BID Massengill, [name], MD   100 mg at 08/16/22 1829   feeding supplement (ENSURE ENLIVE / ENSURE PLUS) liquid 237 mL  237 mL Oral TID BM Starleen Blue, NP   237 mL at 08/17/22 0800   hydrOXYzine (ATARAX) tablet 25 mg  25 mg Oral TID PRN [name], MD   25 mg at 08/12/22 4270   lamoTRIgine (LAMICTAL) tablet 200 mg  200 mg Oral Daily Massengill, [name], MD   200 mg at 08/17/22 0759   LORazepam (ATIVAN) tablet 2 mg  2 mg Oral QID [name], MD   2 mg at 08/17/22 0551   magnesium hydroxide (MILK OF MAGNESIA) suspension 30 mL  30 mL Oral Daily PRN [name], MD       melatonin tablet 5 mg  5 mg Oral QHS Massengill, [name], MD   5 mg at 08/16/22 2107   NIFEdipine (ADALAT CC) 24 hr tablet 90 mg  90 mg Oral [name], MD   90 mg at 08/16/22 2107   propranolol (INDERAL) tablet 20 mg  20 mg Oral Q8H Massengill, [name], MD       Melene Muller ON 08/18/2022] propranolol ER (INDERAL LA) 24 hr capsule 60 mg  60 mg Oral Daily Massengill, Nathan, MD       QUEtiapine (SEROQUEL) tablet 100 mg  100 mg Oral QHS PRN  [name], MD   100 mg at 08/13/22 2249   QUEtiapine (SEROQUEL) tablet 300 mg  300 mg Oral QHS Massengill, [name], MD   300 mg at 08/16/22 2106   traZODone (DESYREL) tablet 50 mg  50 mg Oral QHS Massengill, [name], MD   50 mg at 08/16/22 2107   traZODone (DESYREL) tablet 50 mg  50 mg Oral QHS PRN Massengill, [name], MD       PTA Medications: Medications Prior to Admission  Medication Sig Dispense Refill Last Dose   albuterol (VENTOLIN HFA) 108 (90 Base) MCG/ACT inhaler Inhale 2 puffs into the lungs every 4 (four) hours as needed for wheezing or shortness of breath (cough, shortness of breath or wheezing.). (Patient taking differently: Inhale 2 puffs into the lungs every 4 (four) hours as needed for wheezing or shortness of breath.) 6.7 g 3    clonazePAM (KLONOPIN) 0.5 MG tablet Take 1 tablet (0.5 mg total) by mouth 2 (two) times daily. 30 tablet 0    lamoTRIgine (LAMICTAL) 150 MG tablet Take 1 tablet (150 mg total) by mouth daily.      NIFEdipine (PROCARDIA XL/NIFEDICAL-XL) 90 MG 24 hr tablet Take 90 mg by mouth  at bedtime.      QUEtiapine (SEROQUEL) 100 MG tablet Take 1 tablet (100 mg total) by mouth at bedtime.      traZODone (DESYREL) 100 MG tablet Take 1 tablet (100 mg total) by mouth at bedtime as needed.       Patient Stressors: Financial difficulties   Health problems    Patient Strengths: Supportive family/friends   Treatment Modalities: Medication Management, Group therapy, Case management,  1 to 1 session with clinician, Psychoeducation, Recreational therapy.   Physician Treatment Plan for Primary Diagnosis: Bipolar 1 disorder, mixed (HCC) Long Term Goal(s): Improvement in symptoms so as ready for discharge   Short Term Goals: Ability to identify changes in lifestyle to reduce recurrence of condition will improve Ability to verbalize feelings will improve Ability to disclose and discuss suicidal ideas Ability to demonstrate self-control will improve Ability to  identify and develop effective coping behaviors will improve Compliance with prescribed medications will improve  Medication Management: Evaluate patient's response, side effects, and tolerance of medication regimen.  Therapeutic Interventions: 1 to 1 sessions, Unit Group sessions and Medication administration.  Evaluation of Outcomes: Progressing  Physician Treatment Plan for Secondary Diagnosis: Active Problems:   Asthma   Bipolar I disorder, most recent episode depressed, severe without psychotic features (HCC)   Anxiety   Insomnia   Essential hypertension  Long Term Goal(s): Improvement in symptoms so as ready for discharge   Short Term Goals: Ability to identify changes in lifestyle to reduce recurrence of condition will improve Ability to verbalize feelings will improve Ability to disclose and discuss suicidal ideas Ability to demonstrate self-control will improve Ability to identify and develop effective coping behaviors will improve Compliance with prescribed medications will improve     Medication Management: Evaluate patient's response, side effects, and tolerance of medication regimen.  Therapeutic Interventions: 1 to 1 sessions, Unit Group sessions and Medication administration.  Evaluation of Outcomes: Progressing   RN Treatment Plan for Primary Diagnosis: Bipolar 1 disorder, mixed (HCC) Long Term Goal(s): Knowledge of disease and therapeutic regimen to maintain health will improve  Short Term Goals: Ability to remain free from injury will improve, Ability to verbalize frustration and anger appropriately will improve, Ability to demonstrate self-control, Ability to participate in decision making will improve, Ability to verbalize feelings will improve, Ability to disclose and discuss suicidal ideas, Ability to identify and develop effective coping behaviors will improve, and Compliance with prescribed medications will improve  Medication Management: RN will  administer medications as ordered by provider, will assess and evaluate patient's response and provide education to patient for prescribed medication. RN will report any adverse and/or side effects to prescribing provider.  Therapeutic Interventions: 1 on 1 counseling sessions, Psychoeducation, Medication administration, Evaluate responses to treatment, Monitor vital signs and CBGs as ordered, Perform/monitor CIWA, COWS, AIMS and Fall Risk screenings as ordered, Perform wound care treatments as ordered.  Evaluation of Outcomes: Progressing   LCSW Treatment Plan for Primary Diagnosis: Bipolar 1 disorder, mixed (HCC) Long Term Goal(s): Safe transition to appropriate next level of care at discharge, Engage patient in therapeutic group addressing interpersonal concerns.  Short Term Goals: Engage patient in aftercare planning with referrals and resources, Increase social support, Increase ability to appropriately verbalize feelings, Increase emotional regulation, Facilitate acceptance of mental health diagnosis and concerns, Facilitate patient progression through stages of change regarding substance use diagnoses and concerns, and Identify triggers associated with mental health/substance abuse issues  Therapeutic Interventions: Assess for all discharge needs, 1 to 1 time  with Social worker, Explore available resources and support systems, Assess for adequacy in community support network, Educate family and significant other(s) on suicide prevention, Complete Psychosocial Assessment, Interpersonal group therapy.  Evaluation of Outcomes: Progressing   Progress in Treatment: Attending groups: Yes. Participating in groups: Yes. Taking medication as prescribed: Yes. Toleration medication: Yes. Family/Significant other contact made: No, will contact:  Gabriele Zwilling [phone] (Husband)  Patient understands diagnosis: Yes. Discussing patient identified problems/goals with staff: Yes. Medical  problems stabilized or resolved: Yes. Denies suicidal/homicidal ideation: Yes. Issues/concerns per patient self-inventory: No.   New problem(s) identified: No, Describe:  none reported   New Short Term/Long Term Goal(s):  medication stabilization, elimination of SI thoughts, development of comprehensive mental wellness plan.    Patient Goals:  Pt continues to work on goals stated at initial tx team.  Care Team will continue to provide support and interventions to help reach goals.   Discharge Plan or Barriers: Pt has med management and therapy set up for discharge. No psychosocial barriers noted at this time.   Reason for Continuation of Hospitalization: Anxiety Depression Medication stabilization Suicidal ideation  Estimated Length of Stay: 3-5 days  Last 3 [place] Suicide Severity Risk Score: Flowsheet Row Ad[facility] (Current[facility] Most recent reading at 08/14/2022  1:35 PM ED from 08/10/2022 in [facility] Most recent reading at 08/11/2022  7:44 AM ED from 08/10/2022 in [facility] Most recent reading at 08/10/2022  7:46 PM  C-SSRS RISK CATEGORY Low Risk High Risk Error: Q7 should not be populated when Q6 is No       Last PHQ 2/9 Scores:    08/10/2022    7:45 PM  Depression screen PHQ 2/9  Decreased Interest 3  Down, Depressed, Hopeless 3  PHQ - 2 Score 6  Altered sleeping 3  Tired, decreased energy 1  Change in appetite 3  Feeling bad or failure about yourself  1  Trouble concentrating 2  Moving slowly or fidgety/restless 2  Suicidal thoughts 2  PHQ-9 Score 20  Difficult doing work/chores Very difficult    Scribe for Treatment Team: Beatris Si, LCSW 08/17/2022 9:59 AM

## 2022-08-18 DIAGNOSIS — F3160 Bipolar disorder, current episode mixed, unspecified: Secondary | ICD-10-CM

## 2022-08-18 LAB — RESPIRATORY PANEL BY PCR

## 2022-08-18 LAB — RESP PANEL BY RT-PCR (RSV, FLU A&B, COVID)  RVPGX2
Influenza A by PCR: NEGATIVE
Influenza B by PCR: NEGATIVE
Resp Syncytial Virus by PCR: NEGATIVE
SARS Coronavirus 2 by RT PCR: POSITIVE — AB

## 2022-08-18 MED ORDER — LORAZEPAM 1 MG PO TABS
1.0000 mg | ORAL_TABLET | Freq: Four times a day (QID) | ORAL | Status: DC
  Administered 2022-08-18 – 2022-08-19 (×4): 1 mg via ORAL
  Filled 2022-08-18 (×4): qty 1

## 2022-08-18 MED ORDER — SODIUM CHLORIDE 0.9 % IN NEBU
INHALATION_SOLUTION | RESPIRATORY_TRACT | Status: AC
  Filled 2022-08-18: qty 9

## 2022-08-18 MED ORDER — SODIUM CHLORIDE 0.9 % IN NEBU
INHALATION_SOLUTION | RESPIRATORY_TRACT | Status: AC
  Filled 2022-08-18: qty 3

## 2022-08-18 NOTE — Progress Notes (Signed)
   08/18/22 0820  Psych Admission Type (Psych Patients Only)  Admission Status Voluntary  Psychosocial Assessment  Patient Complaints Sadness;Nervousness  Eye Contact Fair  Facial Expression Flat;Sad  Affect Sad;Flat  Speech Logical/coherent  Interaction Cautious  Motor Activity Slow  Appearance/Hygiene Unremarkable  Behavior Characteristics Appropriate to situation;Cooperative  Mood Depressed;Sad  Thought Process  Coherency Circumstantial  Content WDL  Delusions None reported or observed  Perception WDL  Hallucination Auditory;None reported or observed  Judgment Poor  Confusion None  Danger to Self  Current suicidal ideation? Active  Agreement Not to Harm Self Yes  Description of Agreement Verbal contract  Danger to Others  Danger to Others None reported or observed

## 2022-08-18 NOTE — Progress Notes (Signed)
   08/18/22 0615  Vital Signs  Temp 99.6 F (37.6 C)  Temp Source Oral  Pulse Rate (!) 115  BP 105/81  BP Location Right Arm  BP Method Automatic  Patient Position (if appropriate) Standing   Pt c/o congestion.

## 2022-08-18 NOTE — Progress Notes (Signed)
   08/17/22 2200  Psych Admission Type (Psych Patients Only)  Admission Status Voluntary  Psychosocial Assessment  Patient Complaints Anxiety;Nervousness;Sadness;Worrying  Eye Contact Fair  Facial Expression Flat;Sad  Affect Flat;Sad  Speech Logical/coherent  Interaction Cautious  Motor Activity Slow  Appearance/Hygiene Unremarkable  Behavior Characteristics Appropriate to situation  Mood Sad;Depressed  Thought Process  Coherency Circumstantial  Content WDL  Delusions None reported or observed  Perception WDL  Hallucination None reported or observed  Judgment Poor  Confusion WDL  Danger to Self  Current suicidal ideation? Passive  Agreement Not to Harm Self Yes  Description of Agreement VERBAL  Danger to Others  Danger to Others None reported or observed

## 2022-08-18 NOTE — [facility] Group Notes (Signed)
2 out of 10( doesn't like medication change) Goal : be more social

## 2022-08-18 NOTE — Group Note (Signed)
LCSW Group Therapy Note   Group Date: 08/18/2022 Start Time: 1100 End Time: 1200  Type of Therapy and Topic:  Group Therapy - Healthy vs Unhealthy Coping Skills  Participation Level:  Did Not Attend   Description of Group The focus of this group was to determine what unhealthy coping techniques typically are used by group members and what healthy coping techniques would be helpful in coping with various problems. Patients were guided in becoming aware of the differences between healthy and unhealthy coping techniques. Patients were asked to identify 2-3 healthy coping skills they would like to learn to use more effectively.  Therapeutic Goals Patients learned that coping is what human beings do all day long to deal with various situations in their lives Patients defined and discussed healthy vs unhealthy coping techniques Patients identified their preferred coping techniques and identified whether these were healthy or unhealthy Patients determined 2-3 healthy coping skills they would like to become more familiar with and use more often. Patients provided support and ideas to each other   Summary of Patient Progress:  Did not attend     Therapeutic Modalities Cognitive Behavioral Therapy Motivational Interviewing  Aram Beecham, Theresia Majors 08/18/2022  11:56 AM

## 2022-08-18 NOTE — Progress Notes (Signed)
   08/18/22 2200  Psych Admission Type (Psych Patients Only)  Admission Status Voluntary  Psychosocial Assessment  Patient Complaints Anxiety;Sadness;Nervousness  Eye Contact Fair  Facial Expression Flat;Sad  Affect Sad;Flat  Speech Logical/coherent  Interaction Cautious  Motor Activity Slow  Appearance/Hygiene Unremarkable  Behavior Characteristics Appropriate to situation;Cooperative  Mood Sad;Depressed  Thought Process  Coherency Circumstantial  Content WDL  Delusions None reported or observed  Perception WDL  Hallucination None reported or observed  Judgment Poor  Confusion None  Danger to Self  Current suicidal ideation? Active  Agreement Not to Harm Self Yes  Description of Agreement VERBAL  Danger to Others  Danger to Others None reported or observed

## 2022-08-18 NOTE — Progress Notes (Signed)
[facility] MD Progress Note  08/18/2022 5:58 PM Kara Stephens  MRN:  [id number] Principal Problem: Bipolar 1 disorder, mixed (HCC) Diagnosis: Active Problems:   Asthma   Bipolar I disorder, most recent episode depressed, severe without psychotic features (HCC)   Anxiety   Insomnia   Essential hypertension  Reason For Admission: Kara Stephens is a 36 yo Caucasian female with a prior mental health history of bipolar d/o who presented to the [facility] urgent care [facility]) accompanied by her husband on 12/4 with complaints of worsening depressive symptoms with a plan to jump off their 2nd storey apartment building & intrusive thoughts to harm her 67 month old baby. Pt was transferred voluntarily to this [facility] for treatment and stabilization of her mood.    24 hr chart review: BP earlier today morning elevated at 115. Nursing rechecked it towards the evening and it was WNL.  As per nursing flow sheets, pt slept for a total of 6 hours last night.  As per nursing documentation, pt did not take her scheduled dose of Zyprexa 10 mg last night. She did not require any PRN medications for anxiety or agitation. Pt tested positive for Covid 19 today, and is currently maintaining isolation/droplet precautions in her room on the 400 hall.  Patient assessment note (08/18/2022: Today, mood remains depressed and affect is congruent. Assessment unchanged from yesterday's. Pt continues to report +SI with a plan to jump off the balcony of her second floor apartment building. She denies an intent to carry through with this. She verbally contracts for safety on the unit. She continues to report intrusive thoughts of throwing her 17 month old baby over the balcony of her 2nd floor apartment. Pt continues to report poor sleep, states she slept for 1 hr last night even though as per nursing staff, she is sleeping between 4-6 hrs nightly since admission. Pt has been educated to alert nursing staff during  Q15 minute checks if she is not sleeping, but has not been doing so, which is indicative that she has been getting some sleep. She reports that her appetite is good.  Pt reports her symptoms as being unchanged since admission. It is possible that the infection with Covid 19 has been causing her generalized malaise. It is also possible that pt is over reporting her depressive symptoms, as she has verbalized being afraid to be discharged too soon. He has been encouraged to report her symptoms accurately, and has denied over reporting. She denies AVH, denies paranoia, denies other delusional thinking.  No TD/EPS type symptoms found on assessment, and pt denies any feelings of stiffness. AIMS: 0. We are decreasing Ativan to 1 mg QID for catatonia, and continuing other medications as listed below.  Total Time spent with patient: 45 minutes  Past Psychiatric History: Bipolar d/o  Past Medical History:  Past Medical History:  Diagnosis Date   Anxiety    Back spasm    Bipolar I disorder (HCC)    Hypertension    Tachycardia     Past Surgical History:  Procedure Laterality Date   APPENDECTOMY     CHOLECYSTECTOMY     HAND SURGERY     SHOULDER SURGERY     Family History:  Family History  Problem Relation Age of Onset   Hypertension Mother    Hyperlipidemia Mother    Suicidality Father    Depression Father    Family Psychiatric  History: See above Social History:  Social History  Substance and Sexual Activity  Alcohol Use No     Social History   Substance and Sexual Activity  Drug Use No    Social History   Socioeconomic History   Marital status: Married    Spouse name: Not on file   Number of children: Not on file   Years of education: Not on file   Highest education level: Not on file  Occupational History   Not on file  Tobacco Use   Smoking status: Former    Types: Cigarettes    Quit date: 09/10/2016    Years since quitting: 5.9   Smokeless tobacco: Never  Vaping  Use   Vaping Use: Never used  Substance and Sexual Activity   Alcohol use: No   Drug use: No   Sexual activity: Not on file  Other Topics Concern   Not on file  Social History Narrative   Not on file   Social Determinants of Health   Financial Resource Strain: Not on file  Food Insecurity: No Food Insecurity (08/11/2022)   Hunger Vital Sign    Worried About Running Out of Food in the Last Year: Never true    Ran Out of Food in the Last Year: Never true  Transportation Needs: No Transportation Needs (08/11/2022)   PRAPARE - Administrator, Civil Service (Medical): No    Lack of Transportation (Non-Medical): No  Physical Activity: Not on file  Stress: Not on file  Social Connections: Not on file   Additional Social History:   Sleep: Poor  Appetite:  Fair  Current Medications: Current Facility-Administered Medications  Medication Dose Route Frequency Provider Last Rate Last Admin   acetaminophen (TYLENOL) tablet 650 mg  650 mg Oral Q6H PRN [name], MD   650 mg at 08/15/22 2120   albuterol (VENTOLIN HFA) 108 (90 Base) MCG/ACT inhaler 2 puff  2 puff Inhalation Q4H PRN [name], MD       alum & mag hydroxide-simeth (MAALOX/MYLANTA) 200-200-20 MG/5ML suspension 30 mL  30 mL Oral Q4H PRN [name], MD       docusate sodium (COLACE) capsule 100 mg  100 mg Oral BID Massengill, [name], MD   100 mg at 08/18/22 0813   feeding supplement (ENSURE ENLIVE / ENSURE PLUS) liquid 237 mL  237 mL Oral TID BM Karely Hurtado, NP   237 mL at 08/18/22 1420   [START ON 08/19/2022] FLUoxetine (PROZAC) capsule 20 mg  20 mg Oral Daily Massengill, Nathan, MD       hydrOXYzine (ATARAX) tablet 25 mg  25 mg Oral TID PRN [name], MD   25 mg at 08/12/22 2841   lamoTRIgine (LAMICTAL) tablet 200 mg  200 mg Oral Daily Massengill, [name], MD   200 mg at 08/18/22 0814   LORazepam (ATIVAN) tablet 1 mg  1 mg Oral QID Starleen Blue, NP       magnesium hydroxide (MILK OF MAGNESIA) suspension 30 mL   30 mL Oral Daily PRN [name], MD       melatonin tablet 5 mg  5 mg Oral QHS Massengill, Nathan, MD   5 mg at 08/17/22 2124   metFORMIN (GLUCOPHAGE-XR) 24 hr tablet 500 mg  500 mg Oral Q breakfast Massengill, [name], MD   500 mg at 08/18/22 0814   NIFEdipine (ADALAT CC) 24 hr tablet 90 mg  90 mg Oral [name], MD   90 mg at 08/17/22 2124   OLANZapine zydis (ZYPREXA) disintegrating tablet 10  mg  10 mg Oral QHS Massengill, Nathan, MD       propranolol ER (INDERAL LA) 24 hr capsule 60 mg  60 mg Oral Daily Massengill, Nathan, MD   60 mg at 08/18/22 0813   traZODone (DESYREL) tablet 50 mg  50 mg Oral QHS PRN Massengill, [name], MD   50 mg at 08/17/22 2229   Lab Results:  Results for orders placed or performed during the hospital encounter of 08/11/22 (from the past 48 hour(s))  Resp panel by RT-PCR (RSV, Flu A&B, Covid) Anterior Nasal Swab     Status: Abnormal   Collection Time: 08/18/22  9:17 AM   Specimen: Anterior Nasal Swab  Result Value Ref Range   SARS Coronavirus 2 by RT PCR POSITIVE (A) NEGATIVE    Comment: (NOTE) SARS-CoV-2 target nucleic acids are DETECTED.  The SARS-CoV-2 RNA is generally detectable in upper respiratory specimens during the acute phase of infection. Positive results are indicative of the presence of the identified virus, but do not rule out bacterial infection or co-infection with other pathogens not detected by the test. Clinical correlation with patient history and other diagnostic information is necessary to determine patient infection status. The expected result is Negative.  Fact Sheet for Patients: BloggerCourse.com  Fact Sheet for Healthcare Providers: SeriousBroker.it  This test is not yet approved or cleared by the [place] FDA and  has been authorized for detection and/or diagnosis of SARS-CoV-2 by FDA under an Emergency Use Authorization (EUA).  This EUA will remain in effect (meaning  this test can be used) for the duration of  the COVID-19 declaration under Section 564(b)(1) of the A ct, 21 U.S.C. section 360bbb-3(b)(1), unless the authorization is terminated or revoked sooner.     Influenza A by PCR NEGATIVE NEGATIVE   Influenza B by PCR NEGATIVE NEGATIVE    Comment: (NOTE) The Xpert Xpress SARS-CoV-2/FLU/RSV plus assay is intended as an aid in the diagnosis of influenza from Nasopharyngeal swab specimens and should not be used as a sole basis for treatment. Nasal washings and aspirates are unacceptable for Xpert Xpress SARS-CoV-2/FLU/RSV testing.  Fact Sheet for Patients: BloggerCourse.com  Fact Sheet for Healthcare Providers: SeriousBroker.it  This test is not yet approved or cleared by the [place] FDA and has been authorized for detection and/or diagnosis of SARS-CoV-2 by FDA under an Emergency Use Authorization (EUA). This EUA will remain in effect (meaning this test can be used) for the duration of the COVID-19 declaration under Section 564(b)(1) of the Act, 21 U.S.C. section 360bbb-3(b)(1), unless the authorization is terminated or revoked.     Resp Syncytial Virus by PCR NEGATIVE NEGATIVE    Comment: (NOTE) Fact Sheet for Patients: BloggerCourse.com  Fact Sheet for Healthcare Providers: SeriousBroker.it  This test is not yet approved or cleared by the [place] FDA and has been authorized for detection and/or diagnosis of SARS-CoV-2 by FDA under an Emergency Use Authorization (EUA). This EUA will remain in effect (meaning this test can be used) for the duration of the COVID-19 declaration under Section 564(b)(1) of the Act, 21 U.S.C. section 360bbb-3(b)(1), unless the authorization is terminated or revoked.  Performed at [facility], [address], [place], [place] [address]   Respiratory (~20 pathogens) panel by  PCR     Status: None   Collection Time: 08/18/22  9:17 AM   Specimen: Nasopharyngeal Swab; Respiratory  Result Value Ref Range   Adenovirus NOT DETECTED NOT DETECTED   Coronavirus 229E NOT DETECTED NOT  DETECTED    Comment: (NOTE) The Coronavirus on the Respiratory Panel, DOES NOT test for the novel  Coronavirus (2019 nCoV)    Coronavirus HKU1 NOT DETECTED NOT DETECTED   Coronavirus NL63 NOT DETECTED NOT DETECTED   Coronavirus OC43 NOT DETECTED NOT DETECTED   Metapneumovirus NOT DETECTED NOT DETECTED   Rhinovirus / Enterovirus NOT DETECTED NOT DETECTED   Influenza A NOT DETECTED NOT DETECTED   Influenza B NOT DETECTED NOT DETECTED   Parainfluenza Virus 1 NOT DETECTED NOT DETECTED   Parainfluenza Virus 2 NOT DETECTED NOT DETECTED   Parainfluenza Virus 3 NOT DETECTED NOT DETECTED   Parainfluenza Virus 4 NOT DETECTED NOT DETECTED   Respiratory Syncytial Virus NOT DETECTED NOT DETECTED   Bordetella pertussis NOT DETECTED NOT DETECTED   Bordetella Parapertussis NOT DETECTED NOT DETECTED   Chlamydophila pneumoniae NOT DETECTED NOT DETECTED   Mycoplasma pneumoniae NOT DETECTED NOT DETECTED    Comment: Performed at [facility], [address] [place], [place] [address]    Blood Alcohol level:  Lab Results  Component Value Date   ETH <10 08/10/2022   Metabolic Disorder Labs: Lab Results  Component Value Date   HGBA1C 5.8 (H) 08/10/2022   MPG 120 08/10/2022   No results found for: "PROLACTIN" Lab Results  Component Value Date   CHOL 202 (H) 08/12/2022   TRIG 154 (H) 08/12/2022   HDL 50 08/12/2022   CHOLHDL 4.0 08/12/2022   VLDL 31 08/12/2022   LDLCALC 121 (H) 08/12/2022   Physical Findings: AIMS: 0 CIWA: n/a COWS:n/a  Musculoskeletal: Strength & Muscle Tone: within normal limits Gait & Station: normal Patient leans: N/A  Psychiatric Specialty Exam:  Presentation  General Appearance:  Fairly Groomed  Eye Contact: Fair  Speech: Clear and  Coherent  Speech Volume: Normal  Handedness: Right  Mood and Affect  Mood: Depressed  Affect: Congruent  Thought Process  Thought Processes: Coherent  Descriptions of Associations:Intact  Orientation:Full (Time, Place and Person)  Thought Content:Illogical  History of Schizophrenia/Schizoaffective disorder:Yes  Duration of Psychotic Symptoms:No data recorded Hallucinations:Hallucinations: None  Ideas of Reference:None  Suicidal Thoughts:Suicidal Thoughts: Yes, Active SI Active Intent and/or Plan: Without Intent; With Plan  Homicidal Thoughts:Homicidal Thoughts: Yes, Active HI Active Intent and/or Plan: Without Intent; With Plan   Sensorium  Memory: Immediate Good  Judgment: Fair  Insight: Fair  Art therapist  Concentration: Fair  Attention Span: Fair  Recall: Jennelle Human of Knowledge: Fair  Language: Fair  Psychomotor Activity  Psychomotor Activity: Psychomotor Activity: Normal  Assets  Assets: Resilience; Social Support; Housing  Sleep  Sleep: Sleep: Poor  Physical Exam: Physical Exam Constitutional:      Appearance: Normal appearance.  HENT:     Head: Normocephalic.     Nose: Nose normal. No congestion or rhinorrhea.  Eyes:     Pupils: Pupils are equal, round, and reactive to light.  Pulmonary:     Effort: Pulmonary effort is normal.  Musculoskeletal:     Cervical back: Normal range of motion.  Neurological:     Mental Status: She is alert and oriented to person, place, and time.    Review of Systems  Constitutional:  Negative for chills and fever.  HENT: Negative.  Negative for hearing loss.   Eyes: Negative.  Negative for blurred vision.  Respiratory: Negative.  Negative for cough.   Cardiovascular: Negative.  Negative for chest pain.  Gastrointestinal: Negative.   Genitourinary: Negative.   Musculoskeletal: Negative.   Skin: Negative.  Negative for rash.  Neurological: Negative.  Negative for dizziness  and headaches.  Psychiatric/Behavioral:  Positive for depression and suicidal ideas. Negative for hallucinations, memory loss and substance abuse. The patient is nervous/anxious and has insomnia.    Blood pressure 118/84, pulse 98, temperature 98.4 F (36.9 C), temperature source Oral, resp. rate 20, height 5\' 5"  (1.651 m), weight 91.6 kg, SpO2 98 %. Body mass index is 33.61 kg/m.  Treatment Plan Summary: Daily contact with patient to assess and evaluate symptoms and progress in treatment and Medication management   Observation Level/Precautions:  15 minute checks  Laboratory:  Labs reviewed   Psychotherapy:  Unit Group sessions  Medications:  See [facility]  Consultations:  To be determined   Discharge Concerns:  Safety, medication compliance, mood stability  Estimated LOS: 5-7 days  Other:  N/A    Labs Reviewed independently on 08/13/2022:  CMP WNL, Lipid panel reviewed (cholesterol 202, LDL-121, Triglycerides 154-educated on healthy food choices and exercise. HA1C is 5.8 rendering pt a prediabetic-will need PCP f/u after dc. TSH WNL. UA hazy with rare bacteria otherwise WNL. EKG from 12/04 with QTC-445.    PLAN Safety and Monitoring: Voluntary admission to inpatient psychiatric unit for safety, stabilization and treatment Daily contact with patient to assess and evaluate symptoms and progress in treatment Patient's case to be discussed in multi-disciplinary team meeting Observation Level : q15 minute checks Vital signs: q12 hours Precautions: Safety   Long Term Goal(s): Improvement in symptoms so as ready for discharge   Short Term Goals: Ability to identify changes in lifestyle to reduce recurrence of condition will improve, Ability to verbalize feelings will improve, Ability to disclose and discuss suicidal ideas, Ability to demonstrate self-control will improve, Ability to identify and develop effective coping behaviors will improve, and Compliance with prescribed medications will  improve   Diagnoses:  Active Problems:   Asthma   Bipolar I disorder, most recent episode depressed, severe without psychotic features (HCC)   Anxiety   Insomnia   Essential hypertension   Medications -Increase Prozac to 20 mg daily on 08/19/22 -Continue Zyprexa 10 mg nightly for mood stabilization/intrusive thoughts -Continue Lamictal 200 mg daily for mood stabilization (home med) -Decrease Ativan to 1 mg QID for catatonia (PLEASE DO NOT DISCONTINUE THIS ORDER) -Continue Inderal 20 mg Q 8 H for anxiety/tachycardia (home med) -Discontinue Seroquel 300 mg nightly d/t lack of efficacy (08/17/22) -Discontinue Seroquel 100 mg at bedtime PRN (08/17/22) -Continue Nifedipine 90 mg Q HS for hypertension (home med) -Continue Hydroxyzine 25 mg every 6 hours PRN -Continue Albuterol PRN Q 4 H for wheezing/SOB -Given Ativan 2 mg IM x 0ne dose for catatonia on 12/06-completed with positive response  noted an hour after medication administration (smiling and talking more coherently) -Continue Ensure Nutritional shakes TID in between meals for nutritional support -Continue Trazodone 50 mg nightly and 50 mg PRN for insomnia  Other PRNS -Continue Tylenol 650 mg every 6 hours PRN for mild pain -Continue Maalox 30 mg every 4 hrs PRN for indigestion -Continue Milk of Magnesia as needed every 6 hrs for constipation   Discharge Planning: Social work and case management to assist with discharge planning and identification of hospital follow-up needs prior to discharge Estimated LOS: 5-7 days Discharge Concerns: Need to establish a safety plan; Medication compliance and effectiveness Discharge Goals: Return home with outpatient referrals for mental health follow-up including medication management/psychotherapy   14/11/23, NP 08/18/2022, 5:58 PMPatient ID: 14/08/2022, female   DOB:  02/24/1986, 36 y.o.   MRN: [id number] Patient ID: Barnet PallKristen B Smiddy, female   DOB: 12/17/1985, 36 y.o.   MRN:

## 2022-08-18 NOTE — Plan of Care (Signed)
  Problem: Education: Goal: Emotional status will improve Outcome: Progressing   Problem: Education: Goal: Mental status will improve Outcome: Progressing   Problem: Activity: Goal: Interest or engagement in activities will improve Outcome: Progressing   Problem: Safety: Goal: Periods of time without injury will increase Outcome: Progressing   Problem: Education: Goal: Emotional status will improve Outcome: Progressing Goal: Mental status will improve Outcome: Progressing   Problem: Activity: Goal: Interest or engagement in activities will improve Outcome: Progressing   Problem: Safety: Goal: Periods of time without injury will increase Outcome: Progressing

## 2022-08-19 DIAGNOSIS — F3160 Bipolar disorder, current episode mixed, unspecified: Secondary | ICD-10-CM | POA: Diagnosis not present

## 2022-08-19 MED ORDER — LORAZEPAM 1 MG PO TABS
1.0000 mg | ORAL_TABLET | Freq: Three times a day (TID) | ORAL | Status: DC
  Administered 2022-08-19 – 2022-08-20 (×2): 1 mg via ORAL
  Filled 2022-08-19 (×2): qty 1

## 2022-08-19 MED ORDER — FLUOXETINE HCL 10 MG PO CAPS
30.0000 mg | ORAL_CAPSULE | Freq: Every day | ORAL | Status: DC
  Administered 2022-08-20: 30 mg via ORAL
  Filled 2022-08-19 (×3): qty 3

## 2022-08-19 NOTE — Progress Notes (Signed)
[facility] MD Progress Note  08/19/2022 2:04 PM Kara Stephens  MRN:  [id number] Principal Problem: Bipolar 1 disorder, mixed (HCC) Diagnosis: Active Problems:   Asthma   Bipolar I disorder, most recent episode depressed, severe without psychotic features (HCC)   Anxiety   Insomnia   Essential hypertension  Reason For Admission: Kara Stephens is a 36 yo Caucasian female with a prior mental health history of bipolar d/o who [place] [facility] [place] [facility] accompanied by her husband on 12/4 with complaints of worsening depressive symptoms with a plan to jump off their [address] storey apartment building & intrusive thoughts to harm her 594 month old baby. [facility] and stabilization of her mood.    24 hr chart review: BP earlier today morning elevated at 115. Nursing rechecked it towards the evening and it was WNL.  As per nursing flow sheets, pt slept for a total of 7.45 hours last night. She has been compliant with scheduled medications over the past 24 hrs. She did not require any PRN medications overnight. Droplet and airborne precautions continuing to be observed due to pt testing for Covid 19 yesterday. No behavioral issues noted overnight.   Patient assessment note (08/19/2022: There are no changes today in assessment as compared to yesterday's assessment. Mood remains depressed. As per objective assessment, mood is less depressed as compared to at time of admission. Pt continues to report +SI with plan to jump off balcony of her second floor apartment. She continues to report intrusive thoughts to drop her 124 month old baby over the balcony of that apartment building. She denies having an intent to do so. She verbally contracts for safety on the unit.  Pt continues to report that she is not sleeping, states that she did not get even an hour of sleep. She is getting sleep because upon entry into her room for this  assessment, pt was observed to be sleeping, requiring for her name to be called twice prior to her answering. Pt reports that her appetite is fair, and denies being in any physical pain. She reports that her sore throat is resolving, and reports a slightly stuffy nose.  No TD/EPS type symptoms found on assessment, and pt denies any feelings of stiffness. AIMS: 0. We are decreasing Ativan to 1 mg TID for catatonia, and increasing Prozac Prozac to 30 mg daily for depressive symptoms. We are continuing other medications as listed below.  Total Time spent with patient: 45 minutes  Past Psychiatric History: Bipolar d/o  Past Medical History:  Past Medical History:  Diagnosis Date   Anxiety    Back spasm    Bipolar I disorder (HCC)    Hypertension    Tachycardia     Past Surgical History:  Procedure Laterality Date   APPENDECTOMY     CHOLECYSTECTOMY     HAND SURGERY     SHOULDER SURGERY     Family History:  Family History  Problem Relation Age of Onset   Hypertension Mother    Hyperlipidemia Mother    Suicidality Father    Depression Father    Family Psychiatric  History: See above Social History:  Social History   Substance and Sexual Activity  Alcohol Use No     Social History   Substance and Sexual Activity  Drug Use No    Social History   Socioeconomic History   Marital status: Married    Spouse  name: Not on file   Number of children: Not on file   Years of education: Not on file   Highest education level: Not on file  Occupational History   Not on file  Tobacco Use   Smoking status: Former    Types: Cigarettes    Quit date: 09/10/2016    Years since quitting: 5.9   Smokeless tobacco: Never  Vaping Use   Vaping Use: Never used  Substance and Sexual Activity   Alcohol use: No   Drug use: No   Sexual activity: Not on file  Other Topics Concern   Not on file  Social History Narrative   Not on file   Social Determinants of Health   Financial Resource  Strain: Not on file  Food Insecurity: No Food Insecurity (08/11/2022)   Hunger Vital Sign    Worried About Running Out of Food in the Last Year: Never true    Ran Out of Food in the Last Year: Never true  Transportation Needs: No Transportation Needs (08/11/2022)   PRAPARE - Administrator, Civil Service (Medical): No    Lack of Transportation (Non-Medical): No  Physical Activity: Not on file  Stress: Not on file  Social Connections: Not on file   Additional Social History:   Sleep: Poor  Appetite:  Fair  Current Medications: Current Facility-Administered Medications  Medication Dose Route Frequency Provider Last Rate Last Admin   acetaminophen (TYLENOL) tablet 650 mg  650 mg Oral Q6H PRN [name], MD   650 mg at 08/15/22 2120   albuterol (VENTOLIN HFA) 108 (90 Base) MCG/ACT inhaler 2 puff  2 puff Inhalation Q4H PRN [name], MD       alum & mag hydroxide-simeth (MAALOX/MYLANTA) 200-200-20 MG/5ML suspension 30 mL  30 mL Oral Q4H PRN [name], MD       docusate sodium (COLACE) capsule 100 mg  100 mg Oral BID Massengill, [name], MD   100 mg at 08/19/22 1610   feeding supplement (ENSURE ENLIVE / ENSURE PLUS) liquid 237 mL  237 mL Oral TID BM Dalene Robards, NP   237 mL at 08/19/22 0933   [START ON 08/20/2022] FLUoxetine (PROZAC) capsule 30 mg  30 mg Oral Daily Massengill, Nathan, MD       hydrOXYzine (ATARAX) tablet 25 mg  25 mg Oral TID PRN [name], MD   25 mg at 08/12/22 9604   lamoTRIgine (LAMICTAL) tablet 200 mg  200 mg Oral Daily Massengill, [name], MD   200 mg at 08/19/22 5409   LORazepam (ATIVAN) tablet 1 mg  1 mg Oral Q8H Massengill, Nathan, MD       magnesium hydroxide (MILK OF MAGNESIA) suspension 30 mL  30 mL Oral Daily PRN [name], MD       melatonin tablet 5 mg  5 mg Oral QHS Massengill, Nathan, MD   5 mg at 08/18/22 2120   metFORMIN (GLUCOPHAGE-XR) 24 hr tablet 500 mg  500 mg Oral Q breakfast Massengill, [name], MD   500 mg at 08/19/22 8119    NIFEdipine (ADALAT CC) 24 hr tablet 90 mg  90 mg Oral [name], MD   90 mg at 08/18/22 2120   OLANZapine zydis (ZYPREXA) disintegrating tablet 10 mg  10 mg Oral QHS Massengill, [name], MD   10 mg at 08/18/22 2120   propranolol ER (INDERAL LA) 24 hr capsule 60 mg  60 mg Oral Daily Massengill, [name], MD   60 mg at 08/19/22 [phone]  traZODone (DESYREL) tablet 50 mg  50 mg Oral QHS PRN Massengill, [name], MD   50 mg at 08/17/22 2229   Lab Results:  Results for orders placed or performed during the hospital encounter of 08/11/22 (from the past 48 hour(s))  Resp panel by RT-PCR (RSV, Flu A&B, Covid) Anterior Nasal Swab     Status: Abnormal   Collection Time: 08/18/22  9:17 AM   Specimen: Anterior Nasal Swab  Result Value Ref Range   SARS Coronavirus 2 by RT PCR POSITIVE (A) NEGATIVE    Comment: (NOTE) SARS-CoV-2 target nucleic acids are DETECTED.  The SARS-CoV-2 RNA is generally detectable in upper respiratory specimens during the acute phase of infection. Positive results are indicative of the presence of the identified virus, but do not rule out bacterial infection or co-infection with other pathogens not detected by the test. Clinical correlation with patient history and other diagnostic information is necessary to determine patient infection status. The expected result is Negative.  Fact Sheet for Patients: BloggerCourse.com  Fact Sheet for Healthcare Providers: SeriousBroker.it  This test is not yet approved or cleared by the [place] FDA and  has been authorized for detection and/or diagnosis of SARS-CoV-2 by FDA under an Emergency Use Authorization (EUA).  This EUA will remain in effect (meaning this test can be used) for the duration of  the COVID-19 declaration under Section 564(b)(1) of the A ct, 21 U.S.C. section 360bbb-3(b)(1), unless the authorization is terminated or revoked sooner.     Influenza A by PCR  NEGATIVE NEGATIVE   Influenza B by PCR NEGATIVE NEGATIVE    Comment: (NOTE) The Xpert Xpress SARS-CoV-2/FLU/RSV plus assay is intended as an aid in the diagnosis of influenza from Nasopharyngeal swab specimens and should not be used as a sole basis for treatment. Nasal washings and aspirates are unacceptable for Xpert Xpress SARS-CoV-2/FLU/RSV testing.  Fact Sheet for Patients: BloggerCourse.com  Fact Sheet for Healthcare Providers: SeriousBroker.it  This test is not yet approved or cleared by the [place] FDA and has been authorized for detection and/or diagnosis of SARS-CoV-2 by FDA under an Emergency Use Authorization (EUA). This EUA will remain in effect (meaning this test can be used) for the duration of the COVID-19 declaration under Section 564(b)(1) of the Act, 21 U.S.C. section 360bbb-3(b)(1), unless the authorization is terminated or revoked.     Resp Syncytial Virus by PCR NEGATIVE NEGATIVE    Comment: (NOTE) Fact Sheet for Patients: BloggerCourse.com  Fact Sheet for Healthcare Providers: SeriousBroker.it  This test is not yet approved or cleared by the [place] FDA and has been authorized for detection and/or diagnosis of SARS-CoV-2 by FDA under an Emergency Use Authorization (EUA). This EUA will remain in effect (meaning this test can be used) for the duration of the COVID-19 declaration under Section 564(b)(1) of the Act, 21 U.S.C. section 360bbb-3(b)(1), unless the authorization is terminated or revoked.  Performed at [facility], [address] [place], [place] [address]   Respiratory (~20 pathogens) panel by PCR     Status: None   Collection Time: 08/18/22  9:17 AM   Specimen: Nasopharyngeal Swab; Respiratory  Result Value Ref Range   Adenovirus NOT DETECTED NOT DETECTED   Coronavirus 229E NOT DETECTED NOT DETECTED     Comment: (NOTE) The Coronavirus on the Respiratory Panel, DOES NOT test for the novel  Coronavirus (2019 nCoV)    Coronavirus HKU1 NOT DETECTED NOT DETECTED   Coronavirus NL63 NOT DETECTED NOT DETECTED  Coronavirus OC43 NOT DETECTED NOT DETECTED   Metapneumovirus NOT DETECTED NOT DETECTED   Rhinovirus / Enterovirus NOT DETECTED NOT DETECTED   Influenza A NOT DETECTED NOT DETECTED   Influenza B NOT DETECTED NOT DETECTED   Parainfluenza Virus 1 NOT DETECTED NOT DETECTED   Parainfluenza Virus 2 NOT DETECTED NOT DETECTED   Parainfluenza Virus 3 NOT DETECTED NOT DETECTED   Parainfluenza Virus 4 NOT DETECTED NOT DETECTED   Respiratory Syncytial Virus NOT DETECTED NOT DETECTED   Bordetella pertussis NOT DETECTED NOT DETECTED   Bordetella Parapertussis NOT DETECTED NOT DETECTED   Chlamydophila pneumoniae NOT DETECTED NOT DETECTED   Mycoplasma pneumoniae NOT DETECTED NOT DETECTED    Comment: Performed at [facility], [address], [place], [place] [address]    Blood Alcohol level:  Lab Results  Component Value Date   ETH <10 08/10/2022   Metabolic Disorder Labs: Lab Results  Component Value Date   HGBA1C 5.8 (H) 08/10/2022   MPG 120 08/10/2022   No results found for: "PROLACTIN" Lab Results  Component Value Date   CHOL 202 (H) 08/12/2022   TRIG 154 (H) 08/12/2022   HDL 50 08/12/2022   CHOLHDL 4.0 08/12/2022   VLDL 31 08/12/2022   LDLCALC 121 (H) 08/12/2022   Physical Findings: AIMS: 0 CIWA: n/a COWS:n/a  Musculoskeletal: Strength & Muscle Tone: within normal limits Gait & Station: normal Patient leans: N/A  Psychiatric Specialty Exam:  Presentation  General Appearance:  Appropriate for Environment; Casual  Eye Contact: Fair  Speech: Clear and Coherent  Speech Volume: Normal  Handedness: Right  Mood and Affect  Mood: Depressed  Affect: Congruent  Thought Process  Thought Processes: Coherent  Descriptions of  Associations:Intact  Orientation:Full (Time, Place and Person)  Thought Content:Logical  History of Schizophrenia/Schizoaffective disorder:No  Duration of Psychotic Symptoms:No data recorded Hallucinations:Hallucinations: None  Ideas of Reference:None  Suicidal Thoughts:Suicidal Thoughts: Yes, Active SI Active Intent and/or Plan: Without Intent; With Plan  Homicidal Thoughts:Homicidal Thoughts: Yes, Active HI Active Intent and/or Plan: With Plan; Without Intent   Sensorium  Memory: Immediate Good  Judgment: Fair  Insight: Fair  Art therapist  Concentration: Fair  Attention Span: Fair  Recall: Jennelle Human of Knowledge: Fair  Language: Fair  Psychomotor Activity  Psychomotor Activity: Psychomotor Activity: Normal  Assets  Assets: Resilience; Social Support  Sleep  Sleep: Sleep: Fair  Physical Exam: Physical Exam Constitutional:      Appearance: Normal appearance.  HENT:     Head: Normocephalic.     Nose: Nose normal. No congestion or rhinorrhea.  Eyes:     Pupils: Pupils are equal, round, and reactive to light.  Pulmonary:     Effort: Pulmonary effort is normal.  Musculoskeletal:     Cervical back: Normal range of motion.  Neurological:     Mental Status: She is alert and oriented to person, place, and time.    Review of Systems  Constitutional:  Negative for chills and fever.  HENT: Negative.  Negative for hearing loss.   Eyes: Negative.  Negative for blurred vision.  Respiratory: Negative.  Negative for cough.   Cardiovascular: Negative.  Negative for chest pain.  Gastrointestinal: Negative.   Genitourinary: Negative.   Musculoskeletal: Negative.   Skin: Negative.  Negative for rash.  Neurological: Negative.  Negative for dizziness and headaches.  Psychiatric/Behavioral:  Positive for depression and suicidal ideas. Negative for hallucinations, memory loss and substance abuse. The patient is nervous/anxious and has insomnia.  Blood pressure 113/79, pulse (!) 115, temperature 99.4 F (37.4 C), temperature source Oral, resp. rate 16, height 5\' 5"  (1.651 m), weight 91.6 kg, SpO2 97 %. Body mass index is 33.61 kg/m.  Treatment Plan Summary: Daily contact with patient to assess and evaluate symptoms and progress in treatment and Medication management   Observation Level/Precautions:  15 minute checks  Laboratory:  Labs reviewed   Psychotherapy:  Unit Group sessions  Medications:  See [facility]  Consultations:  To be determined   Discharge Concerns:  Safety, medication compliance, mood stability  Estimated LOS: 5-7 days  Other:  N/A    Labs Reviewed independently on 08/13/2022:  CMP WNL, Lipid panel reviewed (cholesterol 202, LDL-121, Triglycerides 154-educated on healthy food choices and exercise. HA1C is 5.8 rendering pt a prediabetic-will need PCP f/u after dc. TSH WNL. UA hazy with rare bacteria otherwise WNL. EKG from 12/04 with QTC-445.    PLAN Safety and Monitoring: Voluntary admission to inpatient psychiatric unit for safety, stabilization and treatment Daily contact with patient to assess and evaluate symptoms and progress in treatment Patient's case to be discussed in multi-disciplinary team meeting Observation Level : q15 minute checks Vital signs: q12 hours Precautions: Safety   Long Term Goal(s): Improvement in symptoms so as ready for discharge   Short Term Goals: Ability to identify changes in lifestyle to reduce recurrence of condition will improve, Ability to verbalize feelings will improve, Ability to disclose and discuss suicidal ideas, Ability to demonstrate self-control will improve, Ability to identify and develop effective coping behaviors will improve, and Compliance with prescribed medications will improve   Diagnoses:  Active Problems:   Asthma   Bipolar I disorder, most recent episode depressed, severe without psychotic features (HCC)   Anxiety   Insomnia   Essential hypertension    Medications -Increase Prozac to 30 mg daily for depressive symptoms -Continue Zyprexa 10 mg nightly for mood stabilization/intrusive thoughts -Continue Lamictal 200 mg daily for mood stabilization (home med) -Decrease Ativan to 1 mg TID for catatonia (PLEASE DO NOT DISCONTINUE THIS ORDER) -Continue Inderal LA 60 mg daily for anxiety/tachycardia (home med) -Discontinue Seroquel 300 mg nightly d/t lack of efficacy (08/17/22) -Discontinue Seroquel 100 mg at bedtime PRN (08/17/22) -Continue Nifedipine 90 mg Q HS for hypertension (home med) -Continue Hydroxyzine 25 mg every 6 hours PRN -Continue Albuterol PRN Q 4 H for wheezing/SOB -Given Ativan 2 mg IM x 0ne dose for catatonia on 12/06-completed with positive response  noted an hour after medication administration (smiling and talking more coherently) -Continue Ensure Nutritional shakes TID in between meals for nutritional support -Continue Trazodone 50 mg nightly and 50 mg PRN for insomnia  Other PRNS -Continue Tylenol 650 mg every 6 hours PRN for mild pain -Continue Maalox 30 mg every 4 hrs PRN for indigestion -Continue Milk of Magnesia as needed every 6 hrs for constipation   Discharge Planning: Social work and case management to assist with discharge planning and identification of hospital follow-up needs prior to discharge Estimated LOS: 5-7 days Discharge Concerns: Need to establish a safety plan; Medication compliance and effectiveness Discharge Goals: Return home with outpatient referrals for mental health follow-up including medication management/psychotherapy   14/11/23, NP 08/19/2022, 2:04 PMPatient ID: 08/21/2022, female   DOB: [birth year], 36 y.o.   MRN: [id number] Patient ID: KASEE HANTZ, female   DOB: January 02, 1986, 36 y.o.   MRN: Patient ID: ATHALENE KOLLE, female   DOB: June 12, 1986, 36 y.o.   MRN: [id number]

## 2022-08-19 NOTE — Consult Note (Signed)
WOC Nurse Consult Note: Reason for Consult: Consult requested for sacrum wound. Pt is in isolation for Covid at [facility] and is familiar to [facility] team from recent admission on 12/5.  She had previous surgery to sacrum for drainage of a pilonidal cyst. Scar tissue is healed above the wound, but lower portion of full thickness post-op wound is open.  Measurement: 3X1.5X1cm, 100% red and moist, mod amt tan drainage Periwound: intact skin surrounding Dressing procedure/placement/frequency: Topical treatment orders provided for bedside nurses to perform as follows to absorb drainage and provide antimicrobial benefits: Pt may shower every day if this is approved, leave dressing off during shower, then reapply afterwards as follows:  apply a piece of Aquacel [place] # [phone] from materials) into the sacrum wound Q day, using swab to fill in deeply, then cover with bandage.  Please re-consult if further assistance is needed.  Thank-you,  Cammie Mcgee MSN, RN, CWOCN, [place], CNS [phone]

## 2022-08-19 NOTE — Progress Notes (Signed)
Psychoeducational Group Note  Date:  08/19/2022 Time:  2121  Group Topic/Focus:  Wrap-Up Group:   The focus of this group is to help patients review their daily goal of treatment and discuss progress on daily workbooks.  Participation Level: Did Not Attend  Participation Quality:  Not Applicable  Affect:  Not Applicable  Cognitive:  Not Applicable  Insight:  Not Applicable  Engagement in Group: Not Applicable  Additional Comments:  The patient did not attend the evening N.A. meeting.   Hazle Coca S 08/19/2022, 9:21 PM

## 2022-08-19 NOTE — Progress Notes (Addendum)
D: Pt reports SI this morning with a plan to jump off a balcony. Pt reports HI, stating she still has thoughts of hurting her baby. Pt rated her depression a 10/10, anxiety a 10/10, and feelings of hopelessness a 10/10. Pt has been isolated in her room for the entire day due to positive COVID status. Pt has been pleasant, calm, and cooperative throughout the shift.   A: RN provided support and encouragement to patient. Pt given scheduled medications as prescribed. PRN Hydroxyzine given for anxiety. Q15 min checks verified for safety.    R: Patient verbally contracts for safety. Patient compliant with medications and treatment plan. Pt is safe on the unit.   08/19/22 1039  Psych Admission Type (Psych Patients Only)  Admission Status Voluntary  Psychosocial Assessment  Patient Complaints Anxiety;Depression;Self-harm thoughts  Eye Contact Fair  Facial Expression Flat;Sad  Affect Sad;Flat  Speech Logical/coherent  Interaction Cautious  Motor Activity Slow  Appearance/Hygiene Unremarkable  Behavior Characteristics Anxious  Mood Depressed;Sad  Thought Process  Coherency Circumstantial  Content WDL  Delusions None reported or observed  Perception WDL  Hallucination None reported or observed  Judgment Poor  Confusion None  Danger to Self  Current suicidal ideation? Passive  Description of Suicide Plan plan to jump off balcony  Self-Injurious Behavior Some self-injurious ideation observed or expressed.  No lethal plan expressed   Agreement Not to Harm Self Yes  Description of Agreement Pt verbally contracts for safety  Danger to Others  Danger to Others None reported or observed

## 2022-08-20 DIAGNOSIS — F3160 Bipolar disorder, current episode mixed, unspecified: Secondary | ICD-10-CM | POA: Diagnosis not present

## 2022-08-20 MED ORDER — FLUOXETINE HCL 20 MG PO CAPS
40.0000 mg | ORAL_CAPSULE | Freq: Every day | ORAL | Status: DC
  Administered 2022-08-21 – 2022-08-24 (×4): 40 mg via ORAL
  Filled 2022-08-20 (×6): qty 2

## 2022-08-20 MED ORDER — LORAZEPAM 1 MG PO TABS
1.0000 mg | ORAL_TABLET | Freq: Two times a day (BID) | ORAL | Status: DC
  Administered 2022-08-20 – 2022-08-24 (×8): 1 mg via ORAL
  Filled 2022-08-20 (×8): qty 1

## 2022-08-20 MED ORDER — TOPIRAMATE 25 MG PO TABS
25.0000 mg | ORAL_TABLET | Freq: Two times a day (BID) | ORAL | Status: DC
  Administered 2022-08-20 – 2022-08-27 (×15): 25 mg via ORAL
  Filled 2022-08-20 (×20): qty 1

## 2022-08-20 MED ORDER — TOPIRAMATE 25 MG PO TABS
25.0000 mg | ORAL_TABLET | Freq: Two times a day (BID) | ORAL | Status: DC
  Filled 2022-08-20 (×3): qty 1

## 2022-08-20 NOTE — Plan of Care (Signed)
Nurse discussed coping skills with patient.  

## 2022-08-20 NOTE — Progress Notes (Signed)
[facility] MD Progress Note  08/20/2022 1:34 PM Kara Stephens  MRN:  [id number] Principal Problem: Bipolar 1 disorder, mixed ([place]) Diagnosis: Active Problems:   Asthma   Bipolar I disorder, most recent episode depressed, severe without psychotic features ([place])   Anxiety   Insomnia   Essential hypertension  Reason For Admission: [name] is a 36 yo Caucasian female with a prior mental health history of bipolar d/o who presented to the [employer] health urgent care [facility]) accompanied by her husband on 12/4 with complaints of worsening depressive symptoms with a plan to jump off their [address] storey apartment building & intrusive thoughts to harm her 59 month old baby. Pt was transferred voluntarily to this [facility] for treatment and stabilization of her mood.    24 hr chart review: BP for the past 24 hrs have been WNL. No PRN medications for anxiety or agitation given overnight. She has remained on isolation & airborne precautions for Covid 19 infection.  Patient assessment note (08/20/2022: As per objective assessments, mood is less depressed today as compared to yesterday. Pt is also less anxious today as compared to yesterday.  She however, is rating her depression today as 10 (10 being worst). She reports anxiety as 9 (10 being worst). She endorses +SI with plan to jump off the balcony of her second story apartment. She continues to reports intrusive thoughts of wanting to harm her 72 month old baby, but the plan today is to stab him with a knife. She denies having an intent to act on the plans above. She reports that she spoke with her husband earlier today morning, and learned that her family is doing well.   Pt states that her sleep quality last night was poor, but she looks well rested today. She reports a fair appetite, but appetite is good as per objective assessment. Pt had her lunch at time of assessment and ate everything on her tray at time of encounter. Nursing reports  that she is eating all of her meals as well. Pt denies any side effects to her medications. New order for wound care given by wound care NP who came to unit yesterday to see pt, and nursing is doing dressing changes as ordered to sacral wound. No TD/EPS type symptoms found on assessment, and pt denies any feelings of stiffness. AIMS: 0. We decreased Ativan yesterday to 1 mg TID for catatonia, and increased Prozac to 30 mg daily for depressive symptoms. We are further increasing Prozac to 40 mg daily to help with depressive symptoms. We are continuing other medications medications as listed below.  Total Time spent with patient: 45 minutes  Past Psychiatric History: Bipolar d/o  Past Medical History:  Past Medical History:  Diagnosis Date   Anxiety    Back spasm    Bipolar I disorder ([place])    Hypertension    Tachycardia     Past Surgical History:  Procedure Laterality Date   APPENDECTOMY     CHOLECYSTECTOMY     HAND SURGERY     SHOULDER SURGERY     Family History:  Family History  Problem Relation Age of Onset   Hypertension Mother    Hyperlipidemia Mother    Suicidality Father    Depression Father    Family Psychiatric  History: See above Social History:  Social History   Substance and Sexual Activity  Alcohol Use No     Social History   Substance and Sexual Activity  Drug Use  No    Social History   Socioeconomic History   Marital status: Married    Spouse name: Not on file   Number of children: Not on file   Years of education: Not on file   Highest education level: Not on file  Occupational History   Not on file  Tobacco Use   Smoking status: Former    Types: Cigarettes    Quit date: 09/10/2016    Years since quitting: 5.9   Smokeless tobacco: Never  Vaping Use   Vaping Use: Never used  Substance and Sexual Activity   Alcohol use: No   Drug use: No   Sexual activity: Not on file  Other Topics Concern   Not on file  Social History Narrative   Not on  file   Social Determinants of Health   Financial Resource Strain: Not on file  Food Insecurity: No Food Insecurity (08/11/2022)   Hunger Vital Sign    Worried About Running Out of Food in the Last Year: Never true    Ran Out of Food in the Last Year: Never true  Transportation Needs: No Transportation Needs (08/11/2022)   PRAPARE - Hydrologist (Medical): No    Lack of Transportation (Non-Medical): No  Physical Activity: Not on file  Stress: Not on file  Social Connections: Not on file   Additional Social History:   Sleep: Poor  Appetite:  Fair  Current Medications: Current Facility-Administered Medications  Medication Dose Route Frequency Provider Last Rate Last Admin   acetaminophen (TYLENOL) tablet 650 mg  650 mg Oral Q6H PRN France Ravens, MD   650 mg at 08/20/22 0934   albuterol (VENTOLIN HFA) 108 (90 Base) MCG/ACT inhaler 2 puff  2 puff Inhalation Q4H PRN France Ravens, MD       alum & mag hydroxide-simeth (MAALOX/MYLANTA) 200-200-20 MG/5ML suspension 30 mL  30 mL Oral Q4H PRN France Ravens, MD       docusate sodium (COLACE) capsule 100 mg  100 mg Oral BID Massengill, Ovid Curd, MD   100 mg at 08/20/22 [id number]   feeding supplement (ENSURE ENLIVE / ENSURE PLUS) liquid 237 mL  237 mL Oral TID BM Lemonte Al, NP   237 mL at 08/20/22 0933   [START ON 08/21/2022] FLUoxetine (PROZAC) capsule 40 mg  40 mg Oral Daily Massengill, Nathan, MD       hydrOXYzine (ATARAX) tablet 25 mg  25 mg Oral TID PRN France Ravens, MD   25 mg at 08/19/22 1459   lamoTRIgine (LAMICTAL) tablet 200 mg  200 mg Oral Daily Massengill, Ovid Curd, MD   200 mg at 08/20/22 0929   LORazepam (ATIVAN) tablet 1 mg  1 mg Oral Q8H Massengill, Nathan, MD   1 mg at 08/20/22 0932   magnesium hydroxide (MILK OF MAGNESIA) suspension 30 mL  30 mL Oral Daily PRN France Ravens, MD       melatonin tablet 5 mg  5 mg Oral QHS Massengill, Nathan, MD   5 mg at 08/19/22 2118   metFORMIN (GLUCOPHAGE-XR) 24 hr tablet 500 mg  500  mg Oral Q breakfast Massengill, Ovid Curd, MD   500 mg at 08/20/22 0929   NIFEdipine (ADALAT CC) 24 hr tablet 90 mg  90 mg Oral Aliene Altes, MD   90 mg at 08/19/22 2117   OLANZapine zydis (ZYPREXA) disintegrating tablet 10 mg  10 mg Oral QHS Massengill, Ovid Curd, MD   10 mg at 08/19/22 2118   propranolol ER (  INDERAL LA) 24 hr capsule 60 mg  60 mg Oral Daily Massengill, Nathan, MD   60 mg at 08/20/22 0930   topiramate (TOPAMAX) tablet 25 mg  25 mg Oral Q12H Massengill, Nathan, MD       traZODone (DESYREL) tablet 50 mg  50 mg Oral QHS PRN Janine Limbo, MD   50 mg at 08/17/22 2229   Lab Results:  No results found for this or any previous visit (from the past 85 hour(s)).   Blood Alcohol level:  Lab Results  Component Value Date   ETH <10 [id number]   Metabolic Disorder Labs: Lab Results  Component Value Date   HGBA1C 5.8 (H) 08/10/2022   MPG 120 08/10/2022   No results found for: "PROLACTIN" Lab Results  Component Value Date   CHOL 202 (H) 08/12/2022   TRIG 154 (H) 08/12/2022   HDL 50 08/12/2022   CHOLHDL 4.0 08/12/2022   VLDL 31 08/12/2022   LDLCALC 121 (H) 08/12/2022   Physical Findings: AIMS: 0 CIWA: n/a COWS:n/a  Musculoskeletal: Strength & Muscle Tone: within normal limits Gait & Station: normal Patient leans: N/A  Psychiatric Specialty Exam:  Presentation  General Appearance:  Fairly Groomed; Appropriate for Environment  Eye Contact: Good  Speech: Clear and Coherent  Speech Volume: Normal  Handedness: Right  Mood and Affect  Mood: Depressed  Affect: Congruent  Thought Process  Thought Processes: Coherent  Descriptions of Associations:Intact  Orientation:Full (Time, Place and Person)  Thought Content:Logical  History of Schizophrenia/Schizoaffective disorder:No  Duration of Psychotic Symptoms:No data recorded Hallucinations:Hallucinations: None  Ideas of Reference:None  Suicidal Thoughts:Suicidal Thoughts: No SI Active Intent  and/or Plan: Without Intent; With Plan  Homicidal Thoughts:Homicidal Thoughts: No HI Active Intent and/or Plan: With Plan; Without Intent   Sensorium  Memory: Immediate Good  Judgment: Fair  Insight: Fair  Materials engineer: Fair  Attention Span: Fair  Recall: Smiley Houseman of Knowledge: Fair  Language: Fair  Psychomotor Activity  Psychomotor Activity: Psychomotor Activity: Normal  Assets  Assets: Social Support; Housing  Sleep  Sleep: Sleep: Fair  Physical Exam: Physical Exam Constitutional:      Appearance: Normal appearance.  HENT:     Head: Normocephalic.     Nose: Nose normal. No congestion or rhinorrhea.  Eyes:     Pupils: Pupils are equal, round, and reactive to light.  Pulmonary:     Effort: Pulmonary effort is normal.  Musculoskeletal:     Cervical back: Normal range of motion.  Neurological:     Mental Status: She is alert and oriented to person, place, and time.    Review of Systems  Constitutional:  Negative for chills and fever.  HENT: Negative.  Negative for hearing loss.   Eyes: Negative.  Negative for blurred vision.  Respiratory: Negative.  Negative for cough.   Cardiovascular: Negative.  Negative for chest pain.  Gastrointestinal: Negative.   Genitourinary: Negative.   Musculoskeletal: Negative.   Skin: Negative.  Negative for rash.  Neurological: Negative.  Negative for dizziness and headaches.  Psychiatric/Behavioral:  Positive for depression and suicidal ideas. Negative for hallucinations, memory loss and substance abuse. The patient is nervous/anxious and has insomnia.    Blood pressure 109/79, pulse 91, temperature 97.8 F (36.6 C), temperature source Oral, resp. rate 18, height 5\' 5"  (1.651 m), weight 91.6 kg, SpO2 97 %. Body mass index is 33.61 kg/m.  Treatment Plan Summary: Daily contact with patient to assess and evaluate symptoms and progress in treatment and Medication  management   Observation  Level/Precautions:  15 minute checks  Laboratory:  Labs reviewed   Psychotherapy:  Unit Group sessions  Medications:  See [facility]  Consultations:  To be determined   Discharge Concerns:  Safety, medication compliance, mood stability  Estimated LOS: 5-7 days  Other:  N/A    Labs Reviewed independently on 08/13/2022:  CMP WNL, Lipid panel reviewed (cholesterol 202, LDL-121, Triglycerides 154-educated on healthy food choices and exercise. HA1C is 5.8 rendering pt a prediabetic-will need PCP f/u after dc. TSH WNL. UA hazy with rare bacteria otherwise WNL. EKG from 12/04 with QTC-445.    PLAN Safety and Monitoring: Voluntary admission to inpatient psychiatric unit for safety, stabilization and treatment Daily contact with patient to assess and evaluate symptoms and progress in treatment Patient's case to be discussed in multi-disciplinary team meeting Observation Level : q15 minute checks Vital signs: q12 hours Precautions: Safety   Long Term Goal(s): Improvement in symptoms so as ready for discharge   Short Term Goals: Ability to identify changes in lifestyle to reduce recurrence of condition will improve, Ability to verbalize feelings will improve, Ability to disclose and discuss suicidal ideas, Ability to demonstrate self-control will improve, Ability to identify and develop effective coping behaviors will improve, and Compliance with prescribed medications will improve   Diagnoses:  Active Problems:   Asthma   Bipolar I disorder, most recent episode depressed, severe without psychotic features ([facility])   Anxiety   Insomnia   Essential hypertension   Medications -Increase Prozac to 40 mg daily for depressive symptoms -Continue Zyprexa 10 mg nightly for mood stabilization/intrusive thoughts -Continue Lamictal 200 mg daily for mood stabilization (home med) -Decrease Ativan to 1 mg BID for catatonia (PLEASE DO NOT DISCONTINUE THIS ORDER) -Continue Inderal LA 60 mg daily for  anxiety/tachycardia (home med) -Discontinue Seroquel 300 mg nightly d/t lack of efficacy (08/17/22) -Discontinue Seroquel 100 mg at bedtime PRN (08/17/22) -Continue Nifedipine 90 mg Q HS for hypertension (home med) -Continue Hydroxyzine 25 mg every 6 hours PRN -Continue Albuterol PRN Q 4 H for wheezing/SOB -Given Ativan 2 mg IM x 0ne dose for catatonia on 12/06-completed with positive response  noted an hour after medication administration (smiling and talking more coherently) -Continue Ensure Nutritional shakes TID in between meals for nutritional support -Continue Trazodone 50 mg nightly and 50 mg PRN for insomnia  Other PRNS -Continue Tylenol 650 mg every 6 hours PRN for mild pain -Continue Maalox 30 mg every 4 hrs PRN for indigestion -Continue Milk of Magnesia as needed every 6 hrs for constipation   Discharge Planning: Social work and case management to assist with discharge planning and identification of hospital follow-up needs prior to discharge Estimated LOS: 5-7 days Discharge Concerns: Need to establish a safety plan; Medication compliance and effectiveness Discharge Goals: Return home with outpatient referrals for mental health follow-up including medication management/psychotherapy   Starleen Blue, NP 08/20/2022, 1:34 PMPatient ID: Kara Stephens, female   DOB: [birth year], 77 y.o.   MRN: [id number] Patient ID: Kara Stephens,

## 2022-08-20 NOTE — Progress Notes (Signed)
Wound care completed per MD order.  No drainage, swelling or odor.  Patient tolerated new dressing without complaints of pain or discomfort.

## 2022-08-20 NOTE — Group Note (Signed)
LCSW Group Therapy Note   Group Date: 08/20/2022 Start Time: 1100 End Time: 1200  Type of Therapy and Topic: Group Therapy: Control  Participation Level: Did Not Attend  Description of Group: In this group patients will discuss what is out of their control, what is somewhat in their control, and what is within their control.  They will be encouraged to explore what issues they can control and what issues are out of their control within their daily lives. They will be guided to discuss their thoughts, feelings, and behaviors related to these issues. The group will process together ways to better control things that are well within our own control and how to notice and accept the things that are not within our control. This group will be process-oriented, with patients participating in exploration of their own experiences as well as giving and receiving support and challenge from other group members.  During this group 4 worksheets will be provided to each patient to follow along and fill out.   Therapeutic Goals: 1. Patient will identify what is within their control and what is not within their control. 2. Patient will identify their thoughts and feelings about having control over their own lives. 3. Patient will identify their thoughts and feelings about not having control over everything in their lives.. 4. Patient will identify ways that they can have more control over their own lives. 5. Patient will identify areas were they can allow others to help them or provide assistance.  Summary of Patient Progress: This patient did not attend group due to Covid-19 isolation protocols.  The Pt was provided with a group packet to work on during her spare time.  The Pt was provided with an opportunity to ask questions and share concerns.   Aram Beecham, LCSWA 08/20/2022  1:53 PM

## 2022-08-20 NOTE — Progress Notes (Signed)
D:  Patient has been staying in her room, walking.  Patient given snack by nurse.  Patient stated she was feeling a little better and felt like she could eat now.   Patient stated she did not need her bandage on her lower back changed at this time. A:  Medications administered per MD orders.  Emotional support and encouragement given patient. R:  Denied SI.  Contracts for safety.  Stated she does have intrusive thoughts to hurt someone outside [facility].  Denied A/V hallucinations. Safety maintained with 15  minute checks.\

## 2022-08-20 NOTE — [facility] Group Notes (Signed)
Pt was unable to attend wrap-up group

## 2022-08-20 NOTE — [facility] Group Notes (Signed)
HH Group Notes:  (Nursing/MHT/Case Management/Adjunct)  Date:  08/20/2022  Time:  3:08 PM  Type of Therapy:  Psychoeducational Skills  Participation Level:  Did Not Attend  Participation Quality:  Did not attend  Affect:  Did not attend  Cognitive:  Did not attend  Insight:  None  Engagement in Group:  None  Modes of Intervention:  Patient was invited but did not attend  Summary of Progress/Problems: Pt was invited but did not attend (patient has covid)  Cherre Blanc 08/20/2022, 3:08 PM

## 2022-08-21 ENCOUNTER — Encounter (HOSPITAL_COMMUNITY): Payer: Self-pay

## 2022-08-21 DIAGNOSIS — F3160 Bipolar disorder, current episode mixed, unspecified: Secondary | ICD-10-CM | POA: Diagnosis not present

## 2022-08-21 MED ORDER — POLYETHYLENE GLYCOL 3350 17 G PO PACK
17.0000 g | PACK | Freq: Every day | ORAL | Status: DC
  Administered 2022-08-21 – 2022-09-04 (×13): 17 g via ORAL
  Filled 2022-08-21 (×17): qty 1

## 2022-08-21 NOTE — Progress Notes (Deleted)
D: Pt expressed passive SI this morning with a plan to jump off a balcony. Pt verbally contracts for safety. Pt endorses HI toward her baby, stating "My thoughts of how I want to hurt my baby have changed. I am having thoughts of stabbing my baby now". Pt rated her depression a 10/10, anxiety a 10/10, and feelings of hopelessness a 10/10. Pt complains of 4/10 pain from stomach cramps. Pt reports that she is having feelings of restlessness related to being quarantined in her room. Pt has been calm and cooperative throughout the shift.   A: RN provided support and encouragement to patient. PRN Tylenol given for stomach cramps. RN completed pt's wound care as ordered. Pt given scheduled medications as prescribed. Q15 min checks verified for safety.    R: Patient verbally contracts for safety. Pt is safe on the unit.   08/21/22 0914  Psych Admission Type (Psych Patients Only)  Admission Status Voluntary  Psychosocial Assessment  Patient Complaints Anxiety;Depression;Worrying;Self-harm thoughts  Eye Contact Fair  Facial Expression Flat  Affect Depressed;Anxious;Sad  Speech Logical/coherent;Soft  Interaction Assertive  Motor Activity Slow  Appearance/Hygiene Unremarkable  Behavior Characteristics Anxious  Mood Depressed;Anxious  Thought Process  Coherency Circumstantial  Content WDL  Delusions None reported or observed  Perception WDL  Hallucination None reported or observed  Judgment Impaired  Confusion None  Danger to Self  Current suicidal ideation? Passive  Description of Suicide Plan Jump off the balcony  Self-Injurious Behavior Some self-injurious ideation observed or expressed.  No lethal plan expressed   Agreement Not to Harm Self Yes  Description of Agreement Pt verbally contracts for safety  Danger to Others  Danger to Others None reported or observed  Danger to Others Abnormal  Harmful Behavior to others No threats or harm toward other people  Destructive Behavior No  threats or harm toward property

## 2022-08-21 NOTE — [facility] Group Notes (Signed)
Pt could not attend AA Group.

## 2022-08-21 NOTE — Group Note (Signed)
Date:  08/21/2022 Time:  10:09 AM  Group Topic/Focus:  Goals Group:   The focus of this group is to help patients establish daily goals to achieve during treatment and discuss how the patient can incorporate goal setting into their daily lives to aide in recovery. Orientation:   The focus of this group is to educate the patient on the purpose and policies of crisis stabilization and provide a format to answer questions about their admission.  The group details unit policies and expectations of patients while admitted.    Participation Level:  Did Not Attend  Participation Quality:    Affect:    Cognitive:    Insight:   Engagement in Group:    Modes of Intervention:    Additional Comments:    Ethlyn Alto Lashawn Shandale Malak 08/21/2022, 10:09 AM  

## 2022-08-21 NOTE — Progress Notes (Signed)
[facility] MD Progress Note  08/21/2022 5:19 PM Kara Stephens  MRN:  [id number] Principal Problem: Bipolar 1 disorder, mixed (HCC) Diagnosis: Active Problems:   Asthma   Bipolar I disorder, most recent episode depressed, severe without psychotic features (HCC)   Anxiety   Insomnia   Essential hypertension  Reason For Admission: Kara Stephens is a 36 yo Caucasian female with a prior mental health history of bipolar d/o who presented to the [employer] health urgent care [facility]) accompanied by her husband on 12/4 with complaints of worsening depressive symptoms with a plan to jump off their [address] storey apartment building & intrusive thoughts to harm her 45 month old baby. Pt was transferred voluntarily to this [facility] for treatment and stabilization of her mood.    24 hr chart review: BP & HR for the past 24 hrs have been WNL. Pt slept for 8.25 hrs last night as per nursing documentation. No PRN medications for anxiety or agitation given overnight. She did receive Trazodone 50 mg last night for sleep. Pt received Hydroxyzine 25 mg this afternoon for anxiety, and received Tylenol 650 mg for pain. She has remained on isolation & airborne precautions for Covid 19 infection.  Patient assessment note (08/21/2022: Pt appears less depressed, but continues to report no change in depressive symptoms since admission. She rates depression as 10 (10 being worst). She states that her anxiety is slightly better today, but continues to rate it as 8 (10 being worst). She continues to endorse +SI & +HI towards her 22 month old baby and states that she is having intrusive thoughts to jump off the balcony of her 2nd floor apartment building and also having +HI to stab her baby with a knife. She states that she "slept some", when asked about sleep last night. When questioned further on howl many hours of sleep that she thinks she got, she states that she slept for a total of 2 hrs last night, which is in  contrast to nursing documentation of 8 hrs. She reports a good appetite, requesting for her door to be left open overnight due to worsening of her anxiety and intrusive thoughts when door is closed.  Pt continues to deny any side effects to her medications. Order for wound care given by wound care NP, and nursing is doing dressing changes as ordered to sacral wound. No TD/EPS type symptoms found on assessment, and pt denies any feelings of stiffness. AIMS: 0. We decreased Ativan today to 1 mg BID since catatonia seems to be resolving. Pt is more vocal and less flat. We are continuing Prozac at 40 mg daily for depressive symptoms. We are also continuing Zyprexa 10 mg nightly for intrusive thoughts and sleep. We are continuing other medications medications as listed below. We will continue to monitor.  Total Time spent with patient: 45 minutes  Past Psychiatric History: Bipolar d/o  Past Medical History:  Past Medical History:  Diagnosis Date   Anxiety    Back spasm    Bipolar I disorder (HCC)    Hypertension    Tachycardia     Past Surgical History:  Procedure Laterality Date   APPENDECTOMY     CHOLECYSTECTOMY     HAND SURGERY     SHOULDER SURGERY     Family History:  Family History  Problem Relation Age of Onset   Hypertension Mother    Hyperlipidemia Mother    Suicidality Father    Depression Father    Family Psychiatric  History: See above Social History:  Social History   Substance and Sexual Activity  Alcohol Use No     Social History   Substance and Sexual Activity  Drug Use No    Social History   Socioeconomic History   Marital status: Married    Spouse name: Not on file   Number of children: Not on file   Years of education: Not on file   Highest education level: Not on file  Occupational History   Not on file  Tobacco Use   Smoking status: Former    Types: Cigarettes    Quit date: 09/10/2016    Years since quitting: 5.9   Smokeless tobacco: Never   Vaping Use   Vaping Use: Never used  Substance and Sexual Activity   Alcohol use: No   Drug use: No   Sexual activity: Not on file  Other Topics Concern   Not on file  Social History Narrative   Not on file   Social Determinants of Health   Financial Resource Strain: Not on file  Food Insecurity: No Food Insecurity (08/11/2022)   Hunger Vital Sign    Worried About Running Out of Food in the Last Year: Never true    Ran Out of Food in the Last Year: Never true  Transportation Needs: No Transportation Needs (08/11/2022)   PRAPARE - Administrator, Civil Service (Medical): No    Lack of Transportation (Non-Medical): No  Physical Activity: Not on file  Stress: Not on file  Social Connections: Not on file   Additional Social History:   Sleep: Poor  Appetite:  Fair  Current Medications: Current Facility-Administered Medications  Medication Dose Route Frequency Provider Last Rate Last Admin   acetaminophen (TYLENOL) tablet 650 mg  650 mg Oral Q6H PRN [name], MD   650 mg at 08/21/22 0935   albuterol (VENTOLIN HFA) 108 (90 Base) MCG/ACT inhaler 2 puff  2 puff Inhalation Q4H PRN [name], MD       alum & mag hydroxide-simeth (MAALOX/MYLANTA) 200-200-20 MG/5ML suspension 30 mL  30 mL Oral Q4H PRN [name], MD       docusate sodium (COLACE) capsule 100 mg  100 mg Oral BID Massengill, [name], MD   100 mg at 08/21/22 1642   FLUoxetine (PROZAC) capsule 40 mg  40 mg Oral Daily Massengill, [name], MD   40 mg at 08/21/22 0734   hydrOXYzine (ATARAX) tablet 25 mg  25 mg Oral TID PRN [name], MD   25 mg at 08/21/22 1642   lamoTRIgine (LAMICTAL) tablet 200 mg  200 mg Oral Daily Massengill, [name], MD   200 mg at 08/21/22 0734   LORazepam (ATIVAN) tablet 1 mg  1 mg Oral Q12H Massengill, Nathan, MD   1 mg at 08/21/22 0734   magnesium hydroxide (MILK OF MAGNESIA) suspension 30 mL  30 mL Oral Daily PRN [name], MD       melatonin tablet 5 mg  5 mg Oral QHS Massengill,  Nathan, MD   5 mg at 08/20/22 2057   metFORMIN (GLUCOPHAGE-XR) 24 hr tablet 500 mg  500 mg Oral Q breakfast Massengill, [name], MD   500 mg at 08/21/22 0735   NIFEdipine (ADALAT CC) 24 hr tablet 90 mg  90 mg Oral [name], MD   90 mg at 08/20/22 2056   OLANZapine zydis (ZYPREXA) disintegrating tablet 10 mg  10 mg Oral QHS Massengill, [name], MD   10 mg at 08/20/22  2057   polyethylene glycol (MIRALAX / GLYCOLAX) packet 17 g  17 g Oral Daily Massengill, Nathan, MD   17 g at 08/21/22 1353   propranolol ER (INDERAL LA) 24 hr capsule 60 mg  60 mg Oral Daily Massengill, Nathan, MD   60 mg at 08/21/22 0734   topiramate (TOPAMAX) tablet 25 mg  25 mg Oral Q12H Massengill, [name], MD   25 mg at 08/21/22 0734   traZODone (DESYREL) tablet 50 mg  50 mg Oral QHS PRN [name], MD   50 mg at 08/20/22 2055   Lab Results:  No results found for this or any previous visit (from the past 48 hour(s)).   Blood Alcohol level:  Lab Results  Component Value Date   ETH <10 08/10/2022   Metabolic Disorder Labs: Lab Results  Component Value Date   HGBA1C 5.8 (H) 08/10/2022   MPG 120 08/10/2022   No results found for: "PROLACTIN" Lab Results  Component Value Date   CHOL 202 (H) 08/12/2022   TRIG 154 (H) 08/12/2022   HDL 50 08/12/2022   CHOLHDL 4.0 08/12/2022   VLDL 31 08/12/2022   LDLCALC 121 (H) 08/12/2022   Physical Findings: AIMS: 0 CIWA: n/a COWS:n/a  Musculoskeletal: Strength & Muscle Tone: within normal limits Gait & Station: normal Patient leans: N/A  Psychiatric Specialty Exam:  Presentation  General Appearance:  Appropriate for Environment; Fairly Groomed  Eye Contact: Good  Speech: Clear and Coherent  Speech Volume: Normal  Handedness: Right  Mood and Affect  Mood: Depressed  Affect: Congruent  Thought Process  Thought Processes: Coherent  Descriptions of Associations:Intact  Orientation:Full (Time, Place and Person)  Thought  Content:Logical  History of Schizophrenia/Schizoaffective disorder:No  Duration of Psychotic Symptoms:No data recorded Hallucinations:Hallucinations: None  Ideas of Reference:None  Suicidal Thoughts:Suicidal Thoughts: Yes, Active SI Active Intent and/or Plan: Without Intent; With Plan  Homicidal Thoughts:Homicidal Thoughts: Yes, Active HI Active Intent and/or Plan: Without Intent; With Plan   Sensorium  Memory: Immediate Good  Judgment: Fair  Insight: Fair  Art therapist  Concentration: Fair  Attention Span: Fair  Recall: Kara Stephens of Knowledge: Fair  Language: Fair  Psychomotor Activity  Psychomotor Activity: Psychomotor Activity: Normal  Assets  Assets: Manufacturing systems engineer; Resilience; Housing; Social Support  Sleep  Sleep: Sleep: Fair  Physical Exam: Physical Exam Constitutional:      Appearance: Normal appearance.  HENT:     Head: Normocephalic.     Nose: Nose normal. No congestion or rhinorrhea.  Eyes:     Pupils: Pupils are equal, round, and reactive to light.  Pulmonary:     Effort: Pulmonary effort is normal.  Musculoskeletal:     Cervical back: Normal range of motion.  Neurological:     Mental Status: She is alert and oriented to person, place, and time.    Review of Systems  Constitutional:  Negative for chills and fever.  HENT: Negative.  Negative for hearing loss.   Eyes: Negative.  Negative for blurred vision.  Respiratory: Negative.  Negative for cough.   Cardiovascular: Negative.  Negative for chest pain.  Gastrointestinal: Negative.   Genitourinary: Negative.   Musculoskeletal: Negative.   Skin: Negative.  Negative for rash.  Neurological: Negative.  Negative for dizziness and headaches.  Psychiatric/Behavioral:  Positive for depression and suicidal ideas. Negative for hallucinations, memory loss and substance abuse. The patient is nervous/anxious and has insomnia.    Blood pressure 114/82, pulse 88,  temperature 98.5 F (36.9 C), temperature source Oral,  resp. rate 16, height 5\' 5"  (1.651 m), weight 91.6 kg, SpO2 98 %. Body mass index is 33.61 kg/m.  Treatment Plan Summary: Daily contact with patient to assess and evaluate symptoms and progress in treatment and Medication management   Observation Level/Precautions:  15 minute checks  Laboratory:  Labs reviewed   Psychotherapy:  Unit Group sessions  [facility]  [facility]  Consultations:  To be determined   Discharge Concerns:  Safety, medication compliance, mood stability  Estimated LOS: 5-7 days  Other:  N/A    Labs Reviewed independently on 08/13/2022:  CMP WNL, Lipid panel reviewed (cholesterol 202, LDL-121, Triglycerides 154-educated on healthy food choices and exercise. HA1C is 5.8 rendering pt a prediabetic-will need PCP f/u after dc. TSH WNL. UA hazy with rare bacteria otherwise WNL. EKG from 12/04 with QTC-445.    PLAN Safety and Monitoring: Voluntary admission to inpatient psychiatric unit for safety, stabilization and treatment Daily contact with patient to assess and evaluate symptoms and progress in treatment Patient's case to be discussed in multi-disciplinary team meeting Observation Level : q15 minute checks Vital signs: q12 hours Precautions: Safety   Long Term Goal(s): Improvement in symptoms so as ready for discharge   Short Term Goals: Ability to identify changes in lifestyle to reduce recurrence of condition will improve, Ability to verbalize feelings will improve, Ability to disclose and discuss suicidal ideas, Ability to demonstrate self-control will improve, Ability to identify and develop effective coping behaviors will improve, and Compliance with prescribed medications will improve   Diagnoses:  Active Problems:   Asthma   Bipolar I disorder, most recent episode depressed, severe without psychotic features (HCC)   Anxiety   Insomnia   Essential hypertension   Medications -Continue Prozac 40 mg daily  for depressive symptoms -Continue Zyprexa 10 mg nightly for mood stabilization/intrusive thoughts -Continue Lamictal 200 mg daily for mood stabilization (home med) -Continue Ativan to 1 mg BID for catatonia (PLEASE DO NOT DISCONTINUE THIS ORDER) -Continue Inderal LA 60 mg daily for anxiety/tachycardia (home med) -Discontinue Seroquel 300 mg nightly d/t lack of efficacy (08/17/22) -Discontinue Seroquel 100 mg at bedtime PRN (08/17/22) -Continue Nifedipine 90 mg Q HS for hypertension (home med) -Continue Hydroxyzine 25 mg every 6 hours PRN -Continue Albuterol PRN Q 4 H for wheezing/SOB -Given Ativan 2 mg IM x 0ne dose for catatonia on 12/06-completed with positive response  noted an hour after medication administration (smiling and talking more coherently) -Continue Ensure Nutritional shakes TID in between meals for nutritional support -Continue Trazodone 50 mg nightly and 50 mg PRN for insomnia  Other PRNS -Continue Tylenol 650 mg every 6 hours PRN for mild pain -Continue Maalox 30 mg every 4 hrs PRN for indigestion -Continue Milk of Magnesia as needed every 6 hrs for constipation   Discharge Planning: Social work and case management to assist with discharge planning and identification of hospital follow-up needs prior to discharge Estimated LOS: 5-7 days Discharge Concerns: Need to establish a safety plan; Medication compliance and effectiveness Discharge Goals: Return home with outpatient referrals for mental health follow-up including medication management/psychotherapy   Starleen Blueoris  Hensley Treat, NP 08/21/2022, 5:19 PMPatient ID: Barnet PallKristen B Stephens, female   DOB: 01/07/1986, 36 y.o.   MRN: [id number] Patient ID: Barnet PallKristen B Stephens, Patient ID: Barnet PallKristen B Stephens, female   DOB: 07/20/1986, 36 y.o.   MRN: [id number]

## 2022-08-21 NOTE — BH IP Treatment Plan (Signed)
Interdisciplinary Treatment and Diagnostic Plan Update  08/21/2022 Time of Session: 8:30 AM ( UPDATE)  [name] MRN: [id number]  Principal Diagnosis: Bipolar 1 disorder, mixed (HCC)  Secondary Diagnoses: Active Problems:   Asthma   Bipolar I disorder, most recent episode depressed, severe without psychotic features (HCC)   Anxiety   Insomnia   Essential hypertension   Current Medications:  Current Facility-Administered Medications  Medication Dose Route Frequency Provider Last Rate Last Admin   acetaminophen (TYLENOL) tablet 650 mg  650 mg Oral Q6H PRN [name], MD   650 mg at 08/21/22 0935   albuterol (VENTOLIN HFA) 108 (90 Base) MCG/ACT inhaler 2 puff  2 puff Inhalation Q4H PRN [name], MD       alum & mag hydroxide-simeth (MAALOX/MYLANTA) 200-200-20 MG/5ML suspension 30 mL  30 mL Oral Q4H PRN [name], MD       docusate sodium (COLACE) capsule 100 mg  100 mg Oral BID [name], MD   100 mg at 08/21/22 0734   FLUoxetine (PROZAC) capsule 40 mg  40 mg Oral Daily Massengill, [name], MD   40 mg at 08/21/22 0734   hydrOXYzine (ATARAX) tablet 25 mg  25 mg Oral TID PRN [name], MD   25 mg at 08/19/22 1459   lamoTRIgine (LAMICTAL) tablet 200 mg  200 mg Oral Daily Massengill, [name], MD   200 mg at 08/21/22 0734   LORazepam (ATIVAN) tablet 1 mg  1 mg Oral Q12H Massengill, [name], MD   1 mg at 08/21/22 0734   magnesium hydroxide (MILK OF MAGNESIA) suspension 30 mL  30 mL Oral Daily PRN [name], MD       melatonin tablet 5 mg  5 mg Oral QHS Massengill, [name], MD   5 mg at 08/20/22 2057   metFORMIN (GLUCOPHAGE-XR) 24 hr tablet 500 mg  500 mg Oral Q breakfast Massengill, [name], MD   500 mg at 08/21/22 0735   NIFEdipine (ADALAT CC) 24 hr tablet 90 mg  90 mg Oral [name], MD   90 mg at 08/20/22 2056   OLANZapine zydis (ZYPREXA) disintegrating tablet 10 mg  10 mg Oral QHS Massengill, [name], MD   10 mg at 08/20/22 2057   propranolol ER (INDERAL LA) 24 hr  capsule 60 mg  60 mg Oral Daily Massengill, [name], MD   60 mg at 08/21/22 0734   topiramate (TOPAMAX) tablet 25 mg  25 mg Oral Q12H Massengill, [name], MD   25 mg at 08/21/22 0734   traZODone (DESYREL) tablet 50 mg  50 mg Oral QHS PRN [name], MD   50 mg at 08/20/22 2055   PTA Medications: Medications Prior to Admission  Medication Sig Dispense Refill Last Dose   albuterol (VENTOLIN HFA) 108 (90 Base) MCG/ACT inhaler Inhale 2 puffs into the lungs every 4 (four) hours as needed for wheezing or shortness of breath (cough, shortness of breath or wheezing.). (Patient taking differently: Inhale 2 puffs into the lungs every 4 (four) hours as needed for wheezing or shortness of breath.) 6.7 g 3    clonazePAM (KLONOPIN) 0.5 MG tablet Take 1 tablet (0.5 mg total) by mouth 2 (two) times daily. 30 tablet 0    lamoTRIgine (LAMICTAL) 150 MG tablet Take 1 tablet (150 mg total) by mouth daily.      NIFEdipine (PROCARDIA XL/NIFEDICAL-XL) 90 MG 24 hr tablet Take 90 mg by mouth at bedtime.      QUEtiapine (SEROQUEL) 100 MG tablet Take 1 tablet (  100 mg total) by mouth at bedtime.      traZODone (DESYREL) 100 MG tablet Take 1 tablet (100 mg total) by mouth at bedtime as needed.       Patient Stressors: Financial difficulties   Health problems    Patient Strengths: Supportive family/friends   Treatment Modalities: Medication Management, Group therapy, Case management,  1 to 1 session with clinician, Psychoeducation, Recreational therapy.   Physician Treatment Plan for Primary Diagnosis: Bipolar 1 disorder, mixed ([place]) Long Term Goal(s): Improvement in symptoms so as ready for discharge   Short Term Goals: Ability to identify changes in lifestyle to reduce recurrence of condition will improve Ability to verbalize feelings will improve Ability to disclose and discuss suicidal ideas Ability to demonstrate self-control will improve Ability to identify and develop effective coping behaviors will  improve Compliance with prescribed medications will improve  Medication Management: Evaluate patient's response, side effects, and tolerance of medication regimen.  Therapeutic Interventions: 1 to 1 sessions, Unit Group sessions and Medication administration.  Evaluation of Outcomes: Progressing  Physician Treatment Plan for Secondary Diagnosis: Active Problems:   Asthma   Bipolar I disorder, most recent episode depressed, severe without psychotic features ([place])   Anxiety   Insomnia   Essential hypertension  Long Term Goal(s): Improvement in symptoms so as ready for discharge   Short Term Goals: Ability to identify changes in lifestyle to reduce recurrence of condition will improve Ability to verbalize feelings will improve Ability to disclose and discuss suicidal ideas Ability to demonstrate self-control will improve Ability to identify and develop effective coping behaviors will improve Compliance with prescribed medications will improve     Medication Management: Evaluate patient's response, side effects, and tolerance of medication regimen.  Therapeutic Interventions: 1 to 1 sessions, Unit Group sessions and Medication administration.  Evaluation of Outcomes: Progressing   RN Treatment Plan for Primary Diagnosis: Bipolar 1 disorder, mixed ([place]) Long Term Goal(s): Knowledge of disease and therapeutic regimen to maintain health will improve  Short Term Goals: Ability to remain free from injury will improve, Ability to participate in decision making will improve, Ability to verbalize feelings will improve, Ability to identify and develop effective coping behaviors will improve, and Compliance with prescribed medications will improve  Medication Management: RN will administer medications as ordered by provider, will assess and evaluate patient's response and provide education to patient for prescribed medication. RN will report any adverse and/or side effects to prescribing  provider.  Therapeutic Interventions: 1 on 1 counseling sessions, Psychoeducation, Medication administration, Evaluate responses to treatment, Monitor vital signs and CBGs as ordered, Perform/monitor CIWA, COWS, AIMS and Fall Risk screenings as ordered, Perform wound care treatments as ordered.  Evaluation of Outcomes: Progressing   LCSW Treatment Plan for Primary Diagnosis: Bipolar 1 disorder, mixed ([place]) Long Term Goal(s): Safe transition to appropriate next level of care at discharge, Engage patient in therapeutic group addressing interpersonal concerns.  Short Term Goals: Engage patient in aftercare planning with referrals and resources, Increase social support, Increase ability to appropriately verbalize feelings, Increase emotional regulation, Facilitate acceptance of mental health diagnosis and concerns, Facilitate patient progression through stages of change regarding substance use diagnoses and concerns, and Identify triggers associated with mental health/substance abuse issues  Therapeutic Interventions: Assess for all discharge needs, 1 to 1 time with Social worker, Explore available resources and support systems, Assess for adequacy in community support network, Educate family and significant other(s) on suicide prevention, Complete Psychosocial Assessment, Interpersonal group therapy.  Evaluation of Outcomes: Progressing  Progress in Treatment: Attending groups: Yes. Participating in groups: Yes. Taking medication as prescribed: Yes. Toleration medication: Yes. Family/Significant other contact made: No, will contact:  Calaya Schab [phone] (Husband)  Patient understands diagnosis: Yes. Discussing patient identified problems/goals with staff: Yes. Medical problems stabilized or resolved: Yes. Denies suicidal/homicidal ideation: Yes. Issues/concerns per patient self-inventory: No.     New problem(s) identified: No, Describe:  none reported    New Short Term/Long Term  Goal(s):  medication stabilization, elimination of SI thoughts, development of comprehensive mental wellness plan.      Patient Goals:  Pt continues to work on goals stated at initial tx team.  Care Team will continue to provide support and interventions to help reach goals.    Discharge Plan or Barriers: Pt has med management and therapy set up for discharge. No psychosocial barriers noted at this time.    Reason for Continuation of Hospitalization: Anxiety Depression Medication stabilization Suicidal ideation   Estimated Length of Stay: 5-7 days Last 3 [place] Suicide Severity Risk Score: [place] Admission (Current) from 08/11/2022 in [place] [facility] Most recent reading at 08/14/2022  1:35 PM ED from 08/10/2022 in [facility] Most recent reading at 08/11/2022  7:44 AM ED from 08/10/2022 in [facility] Most recent reading at 08/10/2022  7:46 PM  C-SSRS RISK CATEGORY Low Risk High Risk Error: Q7 should not be populated when Q6 is No       Last PHQ 2/9 Scores:    08/10/2022    7:45 PM  Depression screen PHQ 2/9  Decreased Interest 3  Down, Depressed, Hopeless 3  PHQ - 2 Score 6  Altered sleeping 3  Tired, decreased energy 1  Change in appetite 3  Feeling bad or failure about yourself  1  Trouble concentrating 2  Moving slowly or fidgety/restless 2  Suicidal thoughts 2  PHQ-9 Score 20  Difficult doing work/chores Very difficult    Scribe for Treatment Team: Charlett Lango 08/21/2022 11:37 AM

## 2022-08-21 NOTE — [facility] Group Notes (Signed)
[facility] Group Notes:  (Nursing)  Date:  08/21/2022  Time:  1300  Type of Therapy:  Psychoeducational Skills  Participation Level:  Did Not Attend   Shela Nevin 08/21/2022, 3:03 PM

## 2022-08-21 NOTE — Progress Notes (Signed)
Chaplain received a referral to see [facility] but was unable to due to short staffing.  Will attempt next week.  If needs arise over the weekend, please page.  [address], Bcc Pager, [phone]

## 2022-08-21 NOTE — Progress Notes (Signed)
Pt present with anxious and depressed mood on the unit. Pt complained of having racing thoughts that will not go away, planning to jump off her balcony once she leaves the hospital. The writer encouraged the pt to come to staff if the thoughts gets worse. Pt was given her scheduled medications and trazodone for sleep. Pt has been a sleep since then, will continue to monitor.

## 2022-08-21 NOTE — Progress Notes (Signed)
RN completed wound care as ordered for patient. Prior dressing had small amount of serous fluid on it. No signs of infection observed. RN cleansed wound with normal saline, packed wound with Aquacel dressing, and secured with two large bandages.  

## 2022-08-21 NOTE — Progress Notes (Addendum)
D: Pt expressed passive SI this morning with a plan to jump off a balcony. Pt verbally contracts for safety. Pt endorses HI toward her baby, stating "My thoughts of how I want to hurt my baby have changed. I am having thoughts of stabbing my baby now". Pt rated her depression a 10/10, anxiety a 10/10, and feelings of hopelessness a 10/10. Pt complains of 4/10 pain from stomach cramps. Pt reports that she is having feelings of restlessness related to being quarantined in her room. Pt has been calm and cooperative throughout the shift.   A: RN provided support and encouragement to patient. PRN Tylenol given for stomach cramps. PRN Hydroxyzine given for anxiety. RN completed pt's wound care as ordered. Pt given scheduled medications as prescribed. Q15 min checks verified for safety.    R: Patient verbally contracts for safety. Pt is safe on the unit.   08/21/22 0914  Psych Admission Type (Psych Patients Only)  Admission Status Voluntary  Psychosocial Assessment  Patient Complaints Anxiety;Depression;Worrying;Self-harm thoughts  Eye Contact Fair  Facial Expression Flat  Affect Depressed;Anxious;Sad  Speech Logical/coherent;Soft  Interaction Assertive  Motor Activity Slow  Appearance/Hygiene Unremarkable  Behavior Characteristics Anxious  Mood Depressed;Anxious  Thought Process  Coherency Circumstantial  Content WDL  Delusions None reported or observed  Perception WDL  Hallucination None reported or observed  Judgment Impaired  Confusion None  Danger to Self  Current suicidal ideation? Passive  Description of Suicide Plan Jump off the balcony  Self-Injurious Behavior Some self-injurious ideation observed or expressed.  No lethal plan expressed   Agreement Not to Harm Self Yes  Description of Agreement Pt verbally contracts for safety  Danger to Others  Danger to Others Reported or observed  Danger to Others Abnormal  Harmful Behavior to others No threats or harm toward other people   Destructive Behavior No threats or harm toward property  Description of Harmful Behavior Plan to stab baby

## 2022-08-22 DIAGNOSIS — F3160 Bipolar disorder, current episode mixed, unspecified: Secondary | ICD-10-CM | POA: Diagnosis not present

## 2022-08-22 MED ORDER — OLANZAPINE 15 MG PO TBDP
15.0000 mg | ORAL_TABLET | Freq: Every day | ORAL | Status: DC
  Administered 2022-08-22 – 2022-08-23 (×2): 15 mg via ORAL
  Filled 2022-08-22 (×4): qty 1

## 2022-08-22 MED ORDER — WHITE PETROLATUM EX OINT
TOPICAL_OINTMENT | CUTANEOUS | Status: AC
  Administered 2022-08-22: 1 via ORAL
  Filled 2022-08-22: qty 5

## 2022-08-22 NOTE — [facility] Group Notes (Signed)
Psychoeducational Group Note    Date:  08/22/2022 Time:1300-1400    Purpose of Group: . The group focus' on teaching patients on how to identify their needs and their Life Skills:  A group where two lists are made. What people need and what are things that we do that are unhealthy. The lists are developed by the patients and it is explained that we often do the actions that are not healthy to get our list of needs met.  Goal:: to develop the coping skills needed to get their needs met  Participation Level:  Active  Participation Quality:  Appropriate  Affect:  Appropriate  Cognitive:  Oriented  Insight: Improving  Engagement in Group:  Engaged  Modes of Intervention:  Activity, Discussion, Education, and Support  Additional Comments:  Rates energy at a 4/10. States he is sleepy. Did participated in the group.  Jashay Roddy A 

## 2022-08-22 NOTE — Group Note (Signed)
LCSW Group Therapy Note  08/22/2022      Type of Therapy and Topic:  Group Therapy: Gratitude   Description:   Group could not be held by social worker, but licensed RN did provide group.  A handout was given to each patient, with the following information:   Gratitude  "Acknowledging the good that you already have in your life is the foundation for all abundance." - Eckhart Tolle  " 'Enough' is a feast." - Buddhist Proverb  "Gratitude sweetens even the smallest moments."  "It is not joy that makes us grateful; It is gratitude that makes us joyful." - David Steindl-Rast    Put at least one response under each category of something for which you are grateful:  People:  Experiences:  Things:  Places:  Skills:  Other:  Add more responses as you get ideas from other people.   Therapeutic Modalities:   Activity  Devlynn Knoff J Grossman-Orr, LCSW  

## 2022-08-22 NOTE — Progress Notes (Signed)
Pt rated her anxiety and depression a 9 on a scale of 0-10 (10 being the worst). Pt reports trouble falling and staying asleep at night. Pt denies any fever, cough, SHOB, nausea, vomiting, or diarrhea. She does c/o of a headache (rated a 3 on a scale of 0-10 (10 being the worst), which she said was better than before and she denied the need for her PRN tylenol order. Pt remains on airborne and contact precautions for being covid positive. Pt was not able to share any coping skills with this Clinical research associate. Pt did say that she used to like to play softball and that she does enjoy spending time with her family. Pt does have books to read and print outs she can color to keep herself occupied in her room. She still appears depressed and flat. She reports intermittent suicidal thoughts to jump off the balcony at her apartment and throw her child off the balcony as well. Pt verbally contracts for safety. She has no suicidal plan while at [facility] and no self-injurious behaviors have been observed. Pt denies AVH. Active listening, reassurance, and support provided. Q 15 min safety checks continue. Pt's safety has been maintained.   08/22/22 2025  Psych Admission Type (Psych Patients Only)  Admission Status Voluntary  Psychosocial Assessment  Patient Complaints Anxiety;Depression;Sadness;Self-harm thoughts;Worrying  Eye Contact Fair  Facial Expression Flat;Sad  Affect Anxious;Depressed;Sad  Speech Logical/coherent  Interaction Assertive  Motor Activity Slow  Appearance/Hygiene Unremarkable  Behavior Characteristics Cooperative;Anxious  Mood Depressed;Anxious;Sad  Thought Process  Coherency Circumstantial  Content WDL  Delusions None reported or observed  Perception WDL  Hallucination None reported or observed  Judgment Impaired  Confusion None  Danger to Self  Current suicidal ideation? Passive  Description of Suicide Plan jump off the balcony at her apartment  Self-Injurious Behavior Self-injurious ideation  verbalized (no plan while in the hospital)  Agreement Not to Harm Self Yes  Description of Agreement verbally contracts for safety  Danger to Others  Danger to Others Reported or observed  Danger to Others Abnormal  Harmful Behavior to others Threats of violence towards other people observed or expressed   Destructive Behavior No threats or harm toward property  Description of Harmful Behavior plan to throw her child off the balcony at her apartment

## 2022-08-22 NOTE — Progress Notes (Signed)
RN completed wound care as ordered for patient. Prior dressing had small amount of serous fluid on it. No signs of infection observed. RN cleansed wound with normal saline, packed wound with Aquacel dressing, and secured with two large bandages.

## 2022-08-22 NOTE — Progress Notes (Addendum)
D: Pt reported passive SI this morning with a plan to jump off a balcony. Pt reports HI towards her baby this morning, stating "My plan on how I want to hurt my baby has switched back to me wanting to drop the baby". Pt rated her depression a 10/10, anxiety a 10/10, and feelings of hopelessness a 10/10. Pt has been pleasant and cooperative throughout the shift. Pt seen interacting, laughing and talking across the hall with the patient who is also quarantined due to COVID throughout the day.   A: RN provided support and encouragement to patient. Pt given scheduled medications as prescribed. Q15 min checks verified for safety. RN provided pt with coloring sheets and word searches.    R: Patient verbally contracts for safety. Patient compliant with medications and treatment plan. Pt is safe on the unit.   08/22/22 0957  Psych Admission Type (Psych Patients Only)  Admission Status Voluntary  Psychosocial Assessment  Patient Complaints Anxiety;Depression;Self-harm thoughts;Worrying;Hopelessness  Eye Contact Fair  Facial Expression Flat;Sad  Affect Depressed;Anxious;Sad  Speech Logical/coherent  Interaction Assertive  Motor Activity Slow  Appearance/Hygiene Unremarkable  Behavior Characteristics Anxious  Mood Depressed;Anxious  Thought Process  Coherency Circumstantial  Content WDL  Delusions None reported or observed  Perception WDL  Hallucination None reported or observed  Judgment Impaired  Confusion None  Danger to Self  Current suicidal ideation? Passive  Description of Suicide Plan Jump off the balcony  Self-Injurious Behavior Self-injurious ideation verbalized  Agreement Not to Harm Self Yes  Description of Agreement Pt verbally contracts for safety  Danger to Others  Danger to Others Reported or observed  Danger to Others Abnormal  Harmful Behavior to others Threats of violence towards other people observed or expressed   Destructive Behavior No threats or harm toward property   Description of Harmful Behavior Plan to pick up baby and drop it

## 2022-08-22 NOTE — Progress Notes (Signed)
[facility] MD Progress Note  08/22/2022 3:51 PM Kara Stephens  MRN:  [id number] Principal Problem: Bipolar 1 disorder, mixed (HCC) Diagnosis: Active Problems:   Asthma   Bipolar I disorder, most recent episode depressed, severe without psychotic features (HCC)   Anxiety   Insomnia   Essential hypertension  Reason For Admission: Kara Stephens is a 36 yo Caucasian female with a prior mental health history of bipolar d/o who presented to the [facility] urgent [facility]) accompanied by her husband on 12/4 with complaints of worsening depressive symptoms with a plan to jump off their [address] store[address] apartment building & intrusive thoughts to harm her 1 month old baby. Pt was transferred voluntarily to this [facility] for treatment and stabilization of her mood.    24 hr chart review: BP & HR for the past 24 hrs have been WNL. Pt slept for 2 hrs last night as per patient's report. No PRN medications for anxiety or agitation given overnight. Pt received. She has remained on isolation & airborne precautions for Covid 19 infection.  Today's assessment: Patient presents less depressed, but continues to report depressive symptoms since admission. She rates depression as 10/10 (10 being worst). She reports her anxiety is is 10/10 due to being in isolation protocol for COVID precaution. She denies SI however endorses  +HI towards her 51 month old baby and states that she is having intrusive thoughts to drop her baby over the balcony of her 2nd floor apartment building.  She states that she "slept 2 hours", when asked about sleep last night.  No sleep hours recorded, will follow-up with nursing hours to monitor and record accurate sleep hours.      She reports a good appetite.  She is requesting for her door to be left open overnight due to worsening of her anxiety and intrusive thoughts when door is closed.  Pt continues to deny any side effects to her medications. Order for wound care given by  wound care NP, and nursing is doing dressing changes as ordered to sacral wound. No TD/EPS type symptoms found on assessment, and pt denies any feelings of stiffness. AIMS: 0. Ativan was decreased yesterday to 1 mg BID since catatonia seems to be resolving.  No catatonia noted today.  Pt is more vocal and less flat. We are continuing Prozac at 40 mg daily for depressive symptoms.  We are increasing Zyprexa 10 mg to 15 mg p.o. q. nightly for continuous complaint of intrusive thoughts and reduce sleep hours. We are continuing other medications medications as listed below. We will continue to monitor.  Total Time spent with patient: 45 minutes  Past Psychiatric History: Bipolar d/o  Past Medical History:  Past Medical History:  Diagnosis Date   Anxiety    Back spasm    Bipolar I disorder (HCC)    Hypertension    Tachycardia     Past Surgical History:  Procedure Laterality Date   APPENDECTOMY     CHOLECYSTECTOMY     HAND SURGERY     SHOULDER SURGERY     Family History:  Family History  Problem Relation Age of Onset   Hypertension Mother    Hyperlipidemia Mother    Suicidality Father    Depression Father    Family Psychiatric  History: See above Social History:  Social History   Substance and Sexual Activity  Alcohol Use No     Social History   Substance and Sexual Activity  Drug Use No  Social History   Socioeconomic History   Marital status: Married    Spouse name: Not on file   Number of children: Not on file   Years of education: Not on file   Highest education level: Not on file  Occupational History   Not on file  Tobacco Use   Smoking status: Former    Types: Cigarettes    Quit date: 09/10/2016    Years since quitting: 5.9   Smokeless tobacco: Never  Vaping Use   Vaping Use: Never used  Substance and Sexual Activity   Alcohol use: No   Drug use: No   Sexual activity: Not on file  Other Topics Concern   Not on file  Social History Narrative   Not on  file   Social Determinants of Health   Financial Resource Strain: Not on file  Food Insecurity: No Food Insecurity (08/11/2022)   Hunger Vital Sign    Worried About Running Out of Food in the Last Year: Never true    Ran Out of Food in the Last Year: Never true  Transportation Needs: No Transportation Needs (08/11/2022)   PRAPARE - Administrator, Civil Service (Medical): No    Lack of Transportation (Non-Medical): No  Physical Activity: Not on file  Stress: Not on file  Social Connections: Not on file   Additional Social History:   Sleep: Poor  Appetite:  Fair  Current Medications: Current Facility-Administered Medications  Medication Dose Route Frequency Provider Last Rate Last Admin   acetaminophen (TYLENOL) tablet 650 mg  650 mg Oral Q6H PRN [name], MD   650 mg at 08/21/22 0935   albuterol (VENTOLIN HFA) 108 (90 Base) MCG/ACT inhaler 2 puff  2 puff Inhalation Q4H PRN [name], MD       alum & mag hydroxide-simeth (MAALOX/MYLANTA) 200-200-20 MG/5ML suspension 30 mL  30 mL Oral Q4H PRN [name], MD       docusate sodium (COLACE) capsule 100 mg  100 mg Oral BID Massengill, [name], MD   100 mg at 08/22/22 0731   FLUoxetine (PROZAC) capsule 40 mg  40 mg Oral Daily Massengill, [name], MD   40 mg at 08/22/22 0731   hydrOXYzine (ATARAX) tablet 25 mg  25 mg Oral TID PRN [name], MD   25 mg at 08/21/22 1642   lamoTRIgine (LAMICTAL) tablet 200 mg  200 mg Oral Daily Massengill, Nathan, MD   200 mg at 08/22/22 0730   LORazepam (ATIVAN) tablet 1 mg  1 mg Oral Q12H Massengill, Nathan, MD   1 mg at 08/22/22 0732   magnesium hydroxide (MILK OF MAGNESIA) suspension 30 mL  30 mL Oral Daily PRN [name], MD       melatonin tablet 5 mg  5 mg Oral QHS Massengill, Nathan, MD   5 mg at 08/21/22 2100   metFORMIN (GLUCOPHAGE-XR) 24 hr tablet 500 mg  500 mg Oral Q breakfast Massengill, Nathan, MD   500 mg at 08/22/22 0730   NIFEdipine (ADALAT CC) 24 hr tablet 90 mg  90 mg Oral [name], MD   90 mg at 08/21/22 2211   OLANZapine zydis (ZYPREXA) disintegrating tablet 10 mg  10 mg Oral QHS Massengill, [name], MD   10 mg at 08/21/22 2213   polyethylene glycol (MIRALAX / GLYCOLAX) packet 17 g  17 g Oral Daily Massengill, [name], MD   17 g at 08/22/22 0731   propranolol ER (INDERAL LA) 24 hr capsule 60 mg  60 mg Oral Daily Massengill, Nathan, MD   60 mg at 08/22/22 0730   topiramate (TOPAMAX) tablet 25 mg  25 mg Oral Q12H Massengill, [name], MD   25 mg at 08/22/22 0731   traZODone (DESYREL) tablet 50 mg  50 mg Oral QHS PRN [name], MD   50 mg at 08/20/22 2055   Lab Results:  No results found for this or any previous visit (from the past 48 hour(s)).   Blood Alcohol level:  Lab Results  Component Value Date   ETH <10 08/10/2022   Metabolic Disorder Labs: Lab Results  Component Value Date   HGBA1C 5.8 (H) 08/10/2022   MPG 120 08/10/2022   No results found for: "PROLACTIN" Lab Results  Component Value Date   CHOL 202 (H) 08/12/2022   TRIG 154 (H) 08/12/2022   HDL 50 08/12/2022   CHOLHDL 4.0 08/12/2022   VLDL 31 08/12/2022   LDLCALC 121 (H) 08/12/2022   Physical Findings: AIMS: 0 CIWA: n/a COWS:n/a  Musculoskeletal: Strength & Muscle Tone: within normal limits Gait & Station: normal Patient leans: N/A  Psychiatric Specialty Exam:  Presentation  General Appearance:  Appropriate for Environment; Casual; Fairly Groomed  Eye Contact: Good  Speech: Clear and Coherent; Normal Rate  Speech Volume: Normal  Handedness: Right  Mood and Affect  Mood: Depressed  Affect: Congruent  Thought Process  Thought Processes: Coherent  Descriptions of Associations:Intact  Orientation:Full (Time, Place and Person)  Thought Content:Logical  History of Schizophrenia/Schizoaffective disorder:No  Duration of Psychotic Symptoms:No data recorded Hallucinations:Hallucinations: None Description of Auditory Hallucinations:  Denies Description of Visual Hallucinations: Denies  Ideas of Reference:None  Suicidal Thoughts:Suicidal Thoughts: No SI Active Intent and/or Plan: -- (Denies) SI Passive Intent and/or Plan: -- (Denies)  Homicidal Thoughts:Homicidal Thoughts: Yes, Active HI Active Intent and/or Plan: With Intent; With Plan; With Means to Carry Out (Plan to drop her 52-month-old baby over the balcony) HI Passive Intent and/or Plan: -- (Not applicable)   Sensorium  Memory: Immediate Fair; Recent Fair; Remote Fair  Judgment: Poor  Insight: Poor  Executive Functions  Concentration: Fair  Attention Span: Fair  Recall: Fair  Fund of Knowledge: Fair  Language: Good  Psychomotor Activity  Psychomotor Activity: Psychomotor Activity: Normal  Assets  Assets: Communication Skills; Desire for Improvement; Physical Health; Social Support  Sleep  Sleep: Sleep: Poor Number of Hours of Sleep: 2  Physical Exam: Physical Exam Vitals and nursing note reviewed.  Constitutional:      Appearance: Normal appearance.  HENT:     Head: Normocephalic.     Nose: Nose normal. No congestion or rhinorrhea.  Eyes:     Pupils: Pupils are equal, round, and reactive to light.  Pulmonary:     Effort: Pulmonary effort is normal.  Genitourinary:    Comments: Deferred Musculoskeletal:        General: Normal range of motion.     Cervical back: Normal range of motion.  Skin:    General: Skin is warm.  Neurological:     Mental Status: She is alert and oriented to person, place, and time.  Psychiatric:        Mood and Affect: Mood normal.        Behavior: Behavior normal.    Review of Systems  Constitutional:  Negative for chills and fever.  HENT: Negative.  Negative for hearing loss.   Eyes: Negative.  Negative for blurred vision.  Respiratory: Negative.  Negative for cough.   Cardiovascular: Negative.  Negative for  chest pain.  Gastrointestinal: Negative.   Genitourinary: Negative.    Musculoskeletal: Negative.   Skin: Negative.  Negative for rash.  Neurological: Negative.  Negative for dizziness and headaches.  Psychiatric/Behavioral:  Positive for depression and suicidal ideas. Negative for hallucinations, memory loss and substance abuse. The patient is nervous/anxious and has insomnia.    Blood pressure 115/74, pulse 81, temperature 98.5 F (36.9 C), temperature source Oral, resp. rate 16, height 5\' 5"  (1.651 m), weight 91.6 kg, SpO2 96 %. Body mass index is 33.61 kg/m.  Treatment Plan Summary: Daily contact with patient to assess and evaluate symptoms and progress in treatment and Medication management   Observation Level/Precautions:  15 minute checks  Laboratory:  Labs reviewed   Psychotherapy:  Unit Group sessions  Medications:  See [facility]  Consultations:  To be determined   Discharge Concerns:  Safety, medication compliance, mood stability  Estimated LOS: 5-7 days  Other:  N/A    Labs Reviewed independently on 08/13/2022:  CMP WNL, Lipid panel reviewed (cholesterol 202, LDL-121, Triglycerides 154-educated on healthy food choices and exercise. HA1C is 5.8 rendering pt a prediabetic-will need PCP f/u after dc. TSH WNL. UA hazy with rare bacteria otherwise WNL. EKG from 12/04 with QTC-445.    PLAN Safety and Monitoring: Voluntary admission to inpatient psychiatric unit for safety, stabilization and treatment Daily contact with patient to assess and evaluate symptoms and progress in treatment Patient's case to be discussed in multi-disciplinary team meeting Observation Level : q15 minute checks Vital signs: q12 hours Precautions: Safety   Long Term Goal(s): Improvement in symptoms so as ready for discharge   Short Term Goals: Ability to identify changes in lifestyle to reduce recurrence of condition will improve, Ability to verbalize feelings will improve, Ability to disclose and discuss suicidal ideas, Ability to demonstrate self-control will improve, Ability  to identify and develop effective coping behaviors will improve, and Compliance with prescribed medications will improve   Diagnoses:  Active Problems:   Asthma   Bipolar I disorder, most recent episode depressed, severe without psychotic features (HCC)   Anxiety   Insomnia   Essential hypertension   Medications -Continue Prozac 40 mg daily for depressive symptoms -Increase Zyprexa 15 mg nightly for mood stabilization/intrusive thoughts 12 -Continue Lamictal 200 mg daily for mood stabilization (home med) -Continue Ativan to 1 mg BID for catatonia (PLEASE DO NOT DISCONTINUE THIS ORDER) -Continue Inderal LA 60 mg daily for anxiety/tachycardia (home med) -Discontinue Seroquel 300 mg nightly d/t lack of efficacy (08/17/22) -Discontinue Seroquel 100 mg at bedtime PRN (08/17/22) -Continue Nifedipine 90 mg Q HS for hypertension (home med) -Continue Hydroxyzine 25 mg every 6 hours PRN -Continue Albuterol PRN Q 4 H for wheezing/SOB -Given Ativan 2 mg IM x 0ne dose for catatonia on 12/06-completed with positive response  noted an hour after medication administration (smiling and talking more coherently) -Continue Ensure Nutritional shakes TID in between meals for nutritional support -Continue Trazodone 50 mg nightly and 50 mg PRN for insomnia  Other PRNS -Continue Tylenol 650 mg every 6 hours PRN for mild pain -Continue Maalox 30 mg every 4 hrs PRN for indigestion -Continue Milk of Magnesia as needed every 6 hrs for constipation   Discharge Planning: Social work and case management to assist with discharge planning and identification of hospital follow-up needs prior to discharge Estimated LOS: 5-7 days Discharge Concerns: Need to establish a safety plan; Medication compliance and effectiveness Discharge Goals: Return home with outpatient referrals for mental health follow-up including medication  management/psychotherapy   Cecilie Lowersina C Mishawn Didion, FNP 08/22/2022, 3:51 PMPatient ID: Kara Stephens,  female   DOB: 12/26/1985, 36 y.o.   MRN: [id number] Patient ID: Kara PallKristen B Stephens, Patient ID: Kara PallKristen B Stephens, female   DOB: 07/16/1986, 36 y.o.   MRN: [id number] Patient ID: Kara PallKristen B Stephens, female   DOB: 02/19/1986, 36 y.o.   MRN: [id number]

## 2022-08-22 NOTE — Progress Notes (Signed)
Pt rates depression 9/10 and anxiety 9/10. Pt reports a fair appetite, and no physical . Wound care performed this shift and patient tolerated well no complaint of pain or discomfort related to wound care problems. Patient states she is not sleeping well and requesting PRN sleep medication during medium which was not given as patient had standing order for melatonin  Pt denies SI/HI/AVH and verbally contracts for safety. Provided support and encouragement. Pt safe on the unit. Q 15 minute safety checks continued.

## 2022-08-22 NOTE — Progress Notes (Signed)
Psychoeducational Group Note  Date:  08/22/2022 Time:  2058  Group Topic/Focus:  Wrap-Up Group:   The focus of this group is to help patients review their daily goal of treatment and discuss progress on daily workbooks.  Participation Level: Did Not Attend  Participation Quality:  Not Applicable  Affect:  Not Applicable  Cognitive:  Not Applicable  Insight:  Not Applicable  Engagement in Group: Not Applicable  Additional Comments:  The patient did not attend group since she is being quarantined to her room.   Hazle Coca S 08/22/2022, 8:58 PM

## 2022-08-22 NOTE — [facility] Group Notes (Signed)
Goals Group 08/22/22   Group Focus: affirmation, clarity of thought, and goals/reality orientation Treatment Modality:  Psychoeducation Interventions utilized were assignment, group exercise, and support Purpose: To be able to understand and verbalize the reason for their admission to the hospital. To understand that the medication helps with their chemical imbalance but they also need to work on their choices in life. To be challenged to develop a list of 30 positives about themselves. Also introduce the concept that "feelings" are not reality.  Participation Level:  did not attend  Marilu Rylander A 

## 2022-08-23 DIAGNOSIS — F3160 Bipolar disorder, current episode mixed, unspecified: Secondary | ICD-10-CM | POA: Diagnosis not present

## 2022-08-23 NOTE — [facility] Group Notes (Signed)
Pt did not attend group due to the pt being on room precautions. 

## 2022-08-23 NOTE — [facility] Group Notes (Signed)
Adult Psychoeducational Group Note Date:  08/23/2022 Time:  0900-1000 Group Topic/Focus: PROGRESSIVE RELAXATION. A group where deep breathing is taught and tensing and relaxation muscle groups is used. Imagery is used as well.  Pts are asked to imagine 3 pillars that hold them up when they are not able to hold themselves up and to share that with the group.   Participation Level: did not attend   : Eesha Schmaltz A   

## 2022-08-23 NOTE — Progress Notes (Signed)
D:  Patient stated she does have thoughts to hurt herself and others, did not mention her baby.  Contracts for safety.  Denied A/V hallucinations. A:  Medications administered per MD orders.  Emotional support and encouragement given patient. R:  Safety maintained with 15 minute checks.

## 2022-08-23 NOTE — [facility] Group Notes (Signed)
Adult Psychoeducational Group  Date:  08/23/2022 Time:  1100-1200  Group Topic/Focus: Continuation of the group from Saturday. Looking at the lists that were created and talking about what needs to be done with the homework of 30 positives about themselves.                                     Talking about taking their power back and helping themselves to develop a positive self esteem.      Participation Quality:  did not attend  Kara Stephens A   

## 2022-08-23 NOTE — Plan of Care (Signed)
Nurse discussed coping skills with patient.  

## 2022-08-23 NOTE — Progress Notes (Signed)
[facility] MD Progress Note  08/23/2022 1:47 PM CHANTRELL MOTAMEDI  MRN:  [id number] Principal Problem: Bipolar 1 disorder, mixed ([place]) Diagnosis: Active Problems:   Asthma   Bipolar I disorder, most recent episode depressed, severe without psychotic features ([place])   Anxiety   Insomnia   Essential hypertension  Subjective information: Kara Stephens states, " Physically I feel good,  I do not even have any symptoms from COVID viral infection.  But mentally I do not feel good."  Reason For Admission: Kara Stephens is a 36 yo Caucasian female with a prior mental health history of bipolar d/o who presented to the [employer] health urgent care [facility]) accompanied by her husband on 12/4 with complaints of worsening depressive symptoms with a plan to jump off their [address]nd storey apartment building & intrusive thoughts to harm her 5 month old baby. Pt was transferred voluntarily to this [facility] for treatment and stabilization of her mood.    24 hr chart review: BP & HR for the past 24 hrs have been WNL. Pt slept for 8. 25 hrs last night as per nurses flowsheet. No PRN medications for anxiety or agitation given overnight. Pt received all her scheduled psychotropic medications without somatic discomfort.  She has remained on isolation & airborne precautions for Covid 19 viral infection.  Today's assessment: On assessment today, patient continues to present less depressed, she even smiled with this provider when asked if there is any change in her mood today.  However, reports that her mood is still depressed and rates  depression as 10/10 (10 being worst). She reports her anxiety is is 10/10 due to being in isolation protocol for COVID precaution. She endorses SI, with a plan to jump over her second floor balcony.  She further endorses  +HI towards her 57 month old baby and states that she is having intrusive thoughts to drop her baby over the balcony of her 2nd floor apartment building.  Zyprexa  increased to 20 mg p.o. q. nightly for intrusive thoughts.  As per nurses flowsheet, patient slept for 8.25 hours  last night. She reports a good appetite.  She is requesting for her door to be left open overnight due to worsening of her anxiety and intrusive thoughts when door is closed.  Vital signs and lab reviewed without critical values.  Pt continues to deny any side effects to her medications. Order for wound care given by wound care NP, and nursing is doing dressing changes as ordered to sacral wound. No TD/EPS type symptoms found on assessment, and pt denies any feelings of stiffness. AIMS: 0. No catatonia noted today.  Patient continues on Ativan 1 mg BID since catatonia seems to be resolving.   Pt is more vocal and less flat. We are continuing Prozac at 40 mg daily for depressive symptoms.  We are increasing Zyprexa 15 mg to 20 mg p.o. q. nightly for continuous complaint of intrusive thoughts as indicated above. Other medications continues as listed below. We will continue to monitor.  Total Time spent with patient: 45 minutes  Past Psychiatric History: Bipolar d/o  Past Medical History:  Past Medical History:  Diagnosis Date   Anxiety    Back spasm    Bipolar I disorder ([place])    Hypertension    Tachycardia     Past Surgical History:  Procedure Laterality Date   APPENDECTOMY     CHOLECYSTECTOMY     HAND SURGERY     SHOULDER SURGERY  Family History:  Family History  Problem Relation Age of Onset   Hypertension Mother    Hyperlipidemia Mother    Suicidality Father    Depression Father    Family Psychiatric  History: See above Social History:  Social History   Substance and Sexual Activity  Alcohol Use No     Social History   Substance and Sexual Activity  Drug Use No    Social History   Socioeconomic History   Marital status: Married    Spouse name: Not on file   Number of children: Not on file   Years of education: Not on file   Highest education level:  Not on file  Occupational History   Not on file  Tobacco Use   Smoking status: Former    Types: Cigarettes    Quit date: 09/10/2016    Years since quitting: 5.9   Smokeless tobacco: Never  Vaping Use   Vaping Use: Never used  Substance and Sexual Activity   Alcohol use: No   Drug use: No   Sexual activity: Not on file  Other Topics Concern   Not on file  Social History Narrative   Not on file   Social Determinants of Health   Financial Resource Strain: Not on file  Food Insecurity: No Food Insecurity (08/11/2022)   Hunger Vital Sign    Worried About Running Out of Food in the Last Year: Never true    [place] in the Last Year: Never true  Transportation Needs: No Transportation Needs (08/11/2022)   PRAPARE - Hydrologist (Medical): No    Lack of Transportation (Non-Medical): No  Physical Activity: Not on file  Stress: Not on file  Social Connections: Not on file   Additional Social History:   Sleep: Poor  Appetite:  Fair  Current Medications: Current Facility-Administered Medications  Medication Dose Route Frequency Provider Last Rate Last Admin   acetaminophen (TYLENOL) tablet 650 mg  650 mg Oral Q6H PRN France Ravens, MD   650 mg at 08/21/22 0935   albuterol (VENTOLIN HFA) 108 (90 Base) MCG/ACT inhaler 2 puff  2 puff Inhalation Q4H PRN France Ravens, MD       alum & mag hydroxide-simeth (MAALOX/MYLANTA) 200-200-20 MG/5ML suspension 30 mL  30 mL Oral Q4H PRN France Ravens, MD       docusate sodium (COLACE) capsule 100 mg  100 mg Oral BID Massengill, Ovid Curd, MD   100 mg at 08/23/22 0900   FLUoxetine (PROZAC) capsule 40 mg  40 mg Oral Daily Massengill, Ovid Curd, MD   40 mg at 08/23/22 0900   hydrOXYzine (ATARAX) tablet 25 mg  25 mg Oral TID PRN France Ravens, MD   25 mg at 08/21/22 1642   lamoTRIgine (LAMICTAL) tablet 200 mg  200 mg Oral Daily Massengill, Nathan, MD   200 mg at 08/23/22 0900   LORazepam (ATIVAN) tablet 1 mg  1 mg Oral Q12H Massengill,  Nathan, MD   1 mg at 08/23/22 0900   magnesium hydroxide (MILK OF MAGNESIA) suspension 30 mL  30 mL Oral Daily PRN France Ravens, MD       melatonin tablet 5 mg  5 mg Oral QHS Massengill, Nathan, MD   5 mg at 08/22/22 2025   metFORMIN (GLUCOPHAGE-XR) 24 hr tablet 500 mg  500 mg Oral Q breakfast Massengill, Nathan, MD   500 mg at 08/23/22 0900   NIFEdipine (ADALAT CC) 24 hr tablet 90 mg  90  mg Oral Aliene Altes, MD   90 mg at 08/22/22 2208   OLANZapine zydis (ZYPREXA) disintegrating tablet 15 mg  15 mg Oral QHS Adelaide Pfefferkorn C, FNP   15 mg at 08/22/22 2208   polyethylene glycol (MIRALAX / GLYCOLAX) packet 17 g  17 g Oral Daily Massengill, Ovid Curd, MD   17 g at 08/22/22 0731   propranolol ER (INDERAL LA) 24 hr capsule 60 mg  60 mg Oral Daily Massengill, Nathan, MD   60 mg at 08/23/22 0900   topiramate (TOPAMAX) tablet 25 mg  25 mg Oral Q12H Massengill, Nathan, MD   25 mg at 08/23/22 0900   traZODone (DESYREL) tablet 50 mg  50 mg Oral QHS PRN Janine Limbo, MD   50 mg at 08/20/22 2055   Lab Results:  No results found for this or any previous visit (from the past 74 hour(s)).   Blood Alcohol level:  Lab Results  Component Value Date   ETH <10 [id number]   Metabolic Disorder Labs: Lab Results  Component Value Date   HGBA1C 5.8 (H) 08/10/2022   MPG 120 08/10/2022   No results found for: "PROLACTIN" Lab Results  Component Value Date   CHOL 202 (H) 08/12/2022   TRIG 154 (H) 08/12/2022   HDL 50 08/12/2022   CHOLHDL 4.0 08/12/2022   VLDL 31 08/12/2022   LDLCALC 121 (H) 08/12/2022   Physical Findings: AIMS: 0 CIWA: n/a COWS:n/a  Musculoskeletal: Strength & Muscle Tone: within normal limits Gait & Station: normal Patient leans: N/A  Psychiatric Specialty Exam:  Presentation  General Appearance:  Appropriate for Environment; Casual; Fairly Groomed  Eye Contact: Good  Speech: Clear and Coherent; Normal Rate  Speech Volume: Normal  Handedness: Right  Mood and Affect   Mood: Anxious; Depressed (Patient was able to smile when I ask if there is any change in her mood from yesterday to today.  Since she continues to read anxiety and depression as 10/10.)  Affect: Congruent  Thought Process  Thought Processes: Coherent  Descriptions of Associations:Intact  Orientation:Full (Time, Place and Person)  Thought Content:Logical (Still fixated on jumping over her balcony, and throwing her 70-month-old baby over the balcony.)  History of Schizophrenia/Schizoaffective disorder:No  Duration of Psychotic Symptoms:No data recorded Hallucinations:Hallucinations: None Description of Auditory Hallucinations: -- (Denies) Description of Visual Hallucinations: -- (Denies)  Ideas of Reference:None  Suicidal Thoughts:Suicidal Thoughts: Yes, Passive SI Active Intent and/or Plan: With Intent; With Plan; Without Means to Carry Out SI Passive Intent and/or Plan: -- (Denies)  Homicidal Thoughts:Homicidal Thoughts: Yes, Active HI Active Intent and/or Plan: With Intent; With Plan; Without Means to Carry Out HI Passive Intent and/or Plan: -- (Denies)   Sensorium  Memory: Immediate Fair; Recent Fair; Remote Fair  Judgment: Poor  Insight: Poor  Executive Functions  Concentration: Fair  Attention Span: Fair  Recall: [place] of Knowledge: Fair  Language: Good  Psychomotor Activity  Psychomotor Activity: Psychomotor Activity: Normal  Assets  Assets: Communication Skills; Physical Health; Social Support  Sleep  Sleep: Sleep: Good Number of Hours of Sleep: 8.25  Physical Exam: Physical Exam Vitals and nursing note reviewed.  Constitutional:      Appearance: Normal appearance.  HENT:     Head: Normocephalic.     Nose: Nose normal. No congestion or rhinorrhea.  Eyes:     Pupils: Pupils are equal, round, and reactive to light.  Cardiovascular:     Rate and Rhythm: Normal rate.     Pulses: Normal pulses.  Pulmonary:     Effort:  Pulmonary effort is normal.  Genitourinary:    Comments: Deferred Musculoskeletal:        General: Normal range of motion.     Cervical back: Normal range of motion.  Skin:    General: Skin is warm.  Neurological:     Mental Status: She is alert and oriented to person, place, and time.  Psychiatric:        Mood and Affect: Mood normal.        Behavior: Behavior normal.    Review of Systems  Constitutional:  Negative for chills and fever.  HENT: Negative.  Negative for hearing loss.   Eyes: Negative.  Negative for blurred vision.  Respiratory: Negative.  Negative for cough.   Cardiovascular: Negative.  Negative for chest pain.  Gastrointestinal: Negative.   Genitourinary: Negative.   Musculoskeletal: Negative.   Skin: Negative.  Negative for rash.  Neurological: Negative.  Negative for dizziness and headaches.  Psychiatric/Behavioral:  Positive for depression and suicidal ideas. Negative for hallucinations, memory loss and substance abuse. The patient is nervous/anxious.    Blood pressure 111/74, pulse 90, temperature 97.9 F (36.6 C), temperature source Oral, resp. rate 18, height 5\' 5"  (1.651 m), weight 91.6 kg, SpO2 98 %. Body mass index is 33.61 kg/m.  Treatment Plan Summary: Daily contact with patient to assess and evaluate symptoms and progress in treatment and Medication management   Observation Level/Precautions:  15 minute checks  Laboratory:  Labs reviewed   Psychotherapy:  Unit Group sessions  Medications:  See [facility]  Consultations:  To be determined   Discharge Concerns:  Safety, medication compliance, mood stability  Estimated LOS: 5-7 days  Other:  N/A    Labs Reviewed independently on 08/13/2022:  CMP WNL, Lipid panel reviewed (cholesterol 202, LDL-121, Triglycerides 154-educated on healthy food choices and exercise. HA1C is 5.8 rendering pt a prediabetic-will need PCP f/u after dc. TSH WNL. UA hazy with rare bacteria otherwise WNL. EKG from 12/04 with QTC-445.     PLAN Safety and Monitoring: Voluntary admission to inpatient psychiatric unit for safety, stabilization and treatment Daily contact with patient to assess and evaluate symptoms and progress in treatment Patient's case to be discussed in multi-disciplinary team meeting Observation Level : q15 minute checks Vital signs: q12 hours Precautions: Safety   Long Term Goal(s): Improvement in symptoms so as ready for discharge   Short Term Goals: Ability to identify changes in lifestyle to reduce recurrence of condition will improve, Ability to verbalize feelings will improve, Ability to disclose and discuss suicidal ideas, Ability to demonstrate self-control will improve, Ability to identify and develop effective coping behaviors will improve, and Compliance with prescribed medications will improve   Diagnoses:  Active Problems:   Asthma   Bipolar I disorder, most recent episode depressed, severe without psychotic features ([facility])   Anxiety   Insomnia   Essential hypertension   Medications -Continue Prozac 40 mg daily for depressive symptoms -Increase Zyprexa 20 mg nightly for mood stabilization/intrusive thoughts  -Continue Lamictal 200 mg daily for mood stabilization (home med) -Continue Ativan to 1 mg BID for catatonia (PLEASE DO NOT DISCONTINUE THIS ORDER) -Continue Inderal LA 60 mg daily for anxiety/tachycardia (home med) -Discontinue Seroquel 300 mg nightly d/t lack of efficacy (08/17/22) -Discontinue Seroquel 100 mg at bedtime PRN (08/17/22) -Continue Nifedipine 90 mg Q HS for hypertension (home med) -Continue Hydroxyzine 25 mg every 6 hours PRN -Continue Albuterol PRN Q 4 H for wheezing/SOB -Given Ativan 2  mg IM x 0ne dose for catatonia on 12/06-completed with positive response  noted an hour after medication administration (smiling and talking more coherently) -Continue Ensure Nutritional shakes TID in between meals for nutritional support -Continue Trazodone 50 mg nightly and 50  mg PRN for insomnia  Other PRNS -Continue Tylenol 650 mg every 6 hours PRN for mild pain -Continue Maalox 30 mg every 4 hrs PRN for indigestion -Continue Milk of Magnesia as needed every 6 hrs for constipation   Discharge Planning: Social work and case management to assist with discharge planning and identification of hospital follow-up needs prior to discharge Estimated LOS: 5-7 days Discharge Concerns: Need to establish a safety plan; Medication compliance and effectiveness Discharge Goals: Return home with outpatient referrals for mental health follow-up including medication management/psychotherapy   Laretta Bolster, FNP 08/23/2022, 1:47 PMPatient ID: Elie Confer, female   DOB: April 27, 1986, 36 y.o.   MRN: [id number] Patient ID: Elie Confer, Patient ID: Elie Confer, female   DOB: 1986-05-03, 36 y.o.   MRN: [id number] Patient ID: NASHAI VANDENBROEK, female   DOB: Apr 03, 1986, 36 y.o.   MRN: [id number] Patient ID: LOVENA CUN, female   DOB: Mar 21, 1986, 36 y.o.   MRN: [id number]

## 2022-08-23 NOTE — Group Note (Deleted)
LCSW Group Therapy Note   Group Date: 08/23/2022 Start Time: 1000 End Time: 1100   Type of Therapy and Topic:  Group Therapy:   Participation Level:  {BHH PARTICIPATION LEVEL:22264}  Description of Group:   Therapeutic Goals:  1.     Summary of Patient Progress:    ***  Therapeutic Modalities:   Kara Stephens, LCSWA 08/23/2022  9:18 AM    

## 2022-08-23 NOTE — Group Note (Signed)
[facility] LCSW Group Therapy Note  08/23/2022  10:00-11:00AM  Type of Therapy and Topic:  Group Therapy:  Adding Supports Including Myself  Participation Level:  Did Not Attend   Description of Group:  Patients in this group were introduced to the differences between healthy supports and unheathy supports.  This led to a lengthy and emotional discussion about how to set boundaries, particularly with family members.  Many in group expressed that they put other people before themselves.  The group discussed that this not only hurts them, but ultimately ends up hurting the people they want to help.  Examples were given about how to set boundaries one at a time rather than merely cutting people off.  A song entitled "My Own Hero" was played.  A group discussion ensued in which patients stated they could relate to the song and it inspired them to realize they have be willing to help themselves in order to succeed, because other people cannot achieve sobriety or stability for them.  We discussed adding a variety of healthy supports to address the various needs in our lives.    Therapeutic Goals: 1)  demonstrate the importance of being a part of one's own support system by asking for and accepting help 2)  discuss reasons people in one's life may eventually be unable to be continually supportive  3)  identify the patient's current support system and   4)  elicit commitments to add healthy supports and to become more conscious of being self-supportive   Summary of Patient Progress:  N/A  Therapeutic Modalities:   Motivational Interviewing Activity  Elvy Mclarty J Grossman-Orr      

## 2022-08-24 DIAGNOSIS — F3160 Bipolar disorder, current episode mixed, unspecified: Secondary | ICD-10-CM | POA: Diagnosis not present

## 2022-08-24 MED ORDER — NIFEDIPINE ER 60 MG PO TB24
60.0000 mg | ORAL_TABLET | Freq: Every day | ORAL | Status: DC
  Administered 2022-08-24: 60 mg via ORAL
  Filled 2022-08-24 (×2): qty 1

## 2022-08-24 MED ORDER — OLANZAPINE 10 MG PO TBDP
20.0000 mg | ORAL_TABLET | Freq: Every day | ORAL | Status: DC
  Administered 2022-08-24 – 2022-08-26 (×3): 20 mg via ORAL
  Filled 2022-08-24 (×5): qty 2

## 2022-08-24 MED ORDER — FLUOXETINE HCL 20 MG PO CAPS
60.0000 mg | ORAL_CAPSULE | Freq: Every day | ORAL | Status: DC
  Administered 2022-08-25 – 2022-08-27 (×3): 60 mg via ORAL
  Filled 2022-08-24 (×4): qty 3

## 2022-08-24 MED ORDER — LORAZEPAM 1 MG PO TABS
1.0000 mg | ORAL_TABLET | Freq: Three times a day (TID) | ORAL | Status: DC
  Administered 2022-08-24 – 2022-09-04 (×33): 1 mg via ORAL
  Filled 2022-08-24 (×33): qty 1

## 2022-08-24 MED ORDER — MEMANTINE HCL 5 MG PO TABS
5.0000 mg | ORAL_TABLET | Freq: Every day | ORAL | Status: DC
  Administered 2022-08-24 – 2022-08-26 (×3): 5 mg via ORAL
  Filled 2022-08-24 (×5): qty 1

## 2022-08-24 NOTE — Progress Notes (Signed)
Adult Psychoeducational Group Note  Date:  08/24/2022 Time:  9:34 PM  Group Topic/Focus:  AA Group  Participation Level:  Did Not Attend  Participation Quality:   n/a  Affect:   n/a  Cognitive:   n/a  Insight: None  Engagement in Group:   n/a  Modes of Intervention:   n/a  Additional Comments:   Pt did not attend the AA/Wrap Up group.  Kara Stephens 08/24/2022, 9:34 PM

## 2022-08-24 NOTE — Progress Notes (Signed)
Pt rates depression 9/10 and anxiety 9/10. Pt reports a fair  appetite, and no physical problems. Pt denies SI/AVH and verbally contracts for safety. Patient does confirm that she is having HI thoughts toward her baby requesting PRN medication ( Vistaril ) for anxiety with bedtime medications. Refused wound care during this shift shift. Provided support and encouragement. Pt safe on the unit. Q 15 minute safety checks continued.

## 2022-08-24 NOTE — Plan of Care (Signed)
  Problem: Education: Goal: Emotional status will improve Outcome: Not Progressing Goal: Mental status will improve Outcome: Not Progressing   

## 2022-08-24 NOTE — Progress Notes (Signed)
New bandage applied to lower back which is healing.  Area cleaned with saline, material applied and covered with bandages.  No swelling, no odor. Patient talked to her husband earlier today.

## 2022-08-24 NOTE — Group Note (Signed)
Recreation Therapy Group Note   Group Topic:Other  Group Date: 08/24/2022 Start Time: 0935 End Time: 1010 Facilitators: Telford Archambeau-McCall, LRT,CTRS Location: [address] Dayroom   Goal Area(s) Addresses:  Patient will participate in the creative process to complete all crafts.  Patient will interact pro-socially with staff and peers. Patient will share traditions, activities, and positive feelings produced during the holidays.   Group Description: LRT facilitated a therapeutic art activity to encourage self-expression and creativity in recognition of the approaching holidays. Writer explained that the folded paper icicles and chalk drawings created in session will be used to decorate the unit to boost mood and create a positive atmosphere. Patients were encouraged to engage in leisure discussions about festive activities and hobbies they like to participate in during the winter season.   Affect/Mood: N/A   Participation Level: Did not attend    Clinical Observations/Individualized Feedback:      Plan: Continue to engage patient in RT group sessions 2-3x/week.   Berel Najjar-McCall, LRT,CTRS 08/24/2022 11:21 AM

## 2022-08-24 NOTE — Plan of Care (Signed)
Nurse discussed anxiety, depression and coping skills with patient.  

## 2022-08-24 NOTE — Plan of Care (Addendum)
D:  Patient denied A/V hallucinations.  Stated she does have SI and HI which has been ongoing. A:  Medications administered per MD orders.  Emotional support and encouragement given patient. R:  Safety maintained with 15 minute checks.

## 2022-08-24 NOTE — Care Management (Signed)
I talked with pt's husband, Lynder Parents" [phone] - He states that pt had hypomanic episode after deliver, psychiatrist changed meds to olanzapine and Ativan (from Seroquel and clonazepam) and the switch did get the patient to start sleeping. He states that after the change to Zyprexa, made her emotionless and robotic but still able to care for child. Did not show love or affection. "She has not been herself since becoming hypomanic. She never got 100% back to herself."  He states that about 1 month ago, her grandfather died and "I saw a little change in her after that she started getting depressed." Over last 1 month she has been "taking steps backwards". About 3 weeks ago, she started having SI, and thoughts to hurt the baby.    He states that she tells him frequently that she is worried about being discharged.  I brought up pt overreporting symptoms and we discussed that this could be due to her fear of premature discharge.   He states that before this, she has always been a loving mother.   He reports that she has had 2 manic episodes in the past requiring hosp.  Lithium - first episode of mania - stopped due to mania. Lithium switched to lamotrigine which apparently worked well. Seroquel, Lamictal, clonazepam worked well for about 4 years.   He states that she stopped breastfeeding before admission maybe because of fear she would harm child being so close.    He states that last week, she was able to start smiling and crying, which is an improvement.   Pt's father had bipolar 2 - mostly depressed, stabbed himself to death. Pt went manic after this (first time). Second time pt went manic, she had covid, was in hospital, got medication and she became manic.   Edit to plan based on this information:  -increase fluoxetine for depression and ocd -increase zyprexa for bipolar depression -continue topamax for now  -start namenda 5 mg once daily for ocd augmentation -increase ativan to 1  mg q8H  -reduce nifedipine to 60 mg qhs as BP is soft and after lit review, there is some (rare, scant) articles suggesting that nifedipine can somehow affect dopamine pathways (article about nifedipine and tardive dyskinesia treatment and also an article about nifedipine in preventing NMS).

## 2022-08-24 NOTE — Progress Notes (Signed)
    08/23/22 2150  Psych Admission Type (Psych Patients Only)  Admission Status Voluntary  Psychosocial Assessment  Patient Complaints Anxiety;Depression;Self-harm thoughts  Eye Contact Fair  Facial Expression Flat  Affect Depressed;Anxious  Speech Logical/coherent  Interaction Assertive  Motor Activity Slow  Appearance/Hygiene Unremarkable  Behavior Characteristics Cooperative;Anxious  Mood Depressed;Anxious  Thought Process  Coherency Circumstantial  Content WDL  Delusions None reported or observed  Perception WDL  Hallucination None reported or observed  Judgment Impaired  Confusion None  Danger to Self  Current suicidal ideation? Passive  Agreement Not to Harm Self Yes  Description of Agreement verbal  Danger to Others  Danger to Others Reported or observed  Danger to Others Abnormal  Harmful Behavior to others Threats of violence towards other people observed or expressed   Destructive Behavior No threats or harm toward property  Description of Harmful Behavior plan to drop baby

## 2022-08-24 NOTE — Progress Notes (Signed)
[facility] MD Progress Note  08/24/2022 1:43 PM Kara Stephens  MRN:  [id number] Principal Problem: Bipolar 1 disorder, mixed (HCC) Diagnosis: Active Problems:   Asthma   Bipolar I disorder, most recent episode depressed, severe without psychotic features (HCC)   Anxiety   Insomnia   Essential hypertension   COVID-19  Reason For Admission: Kara Stephens is a 36 yo Caucasian female with a prior mental health history of bipolar d/o who presented to the [facility] health urgent care [facility]) accompanied by her husband on 12/4 with complaints of worsening depressive symptoms with a plan to jump off their [address] storey apartment building & intrusive thoughts to harm her 28 month old baby. Pt was transferred voluntarily to this [facility] for treatment and stabilization of her mood.    24 hr chart review: V/S for the past 24 hrs have been WNL. Pt has been compliant with all scheduled medications. She last required Hydroxyzine 25 mg for anxiety yesterday evening. She remains on contact & droplet precautions related to Covid 19 infection. As per nursing flow sheets, pt slept for a total of 9 hrs last night, and not concerns noted overnight.   Today's assessment: Mood is objectively less depressed today, affect is congruent. She is also calm during assessment, but reports anxiety as being high. She continues to report that depression is same as at time of admission, and endorses +SI with plan still being to jump off the balcony, and reports +HI towards her 9 month old baby, with a plan which has changed from stabbing him with a knife to throwing him off the balcony. Pt has been alternating back and forth between these two plans. Writer asked pt SI and HI will resolve if something else is caring for her baby, and she stated that no, because baby will still be in the home. Writer asked if the SI and HI would resolve if the baby was no longer in the home, but a family member elsewhere was caring for the  baby, and pt stated that she would be okay if the latter were to happen, but stated that they did not have any one else to take care of the baby. She states that her 66 yo daughter is currently taking care of the baby at her home.  Pt reports that her sleep quality good and reports a good appetite. She denies any current medication related side effects. She denies being in any physical distress. We are increasing Zyprexa to 20 mg nightly starting tonight (12/18), and continuing other medications as listed below. Sacral wound care is continuing to be completed by nursing staff.   Total Time spent with patient: 45 minutes  Past Psychiatric History: Bipolar d/o  Past Medical History:  Past Medical History:  Diagnosis Date   Anxiety    Back spasm    Bipolar I disorder (HCC)    Hypertension    Tachycardia     Past Surgical History:  Procedure Laterality Date   APPENDECTOMY     CHOLECYSTECTOMY     HAND SURGERY     SHOULDER SURGERY     Family History:  Family History  Problem Relation Age of Onset   Hypertension Mother    Hyperlipidemia Mother    Suicidality Father    Depression Father    Family Psychiatric  History: See above Social History:  Social History   Substance and Sexual Activity  Alcohol Use No     Social History   Substance and  Sexual Activity  Drug Use No    Social History   Socioeconomic History   Marital status: Married    Spouse name: Not on file   Number of children: Not on file   Years of education: Not on file   Highest education level: Not on file  Occupational History   Not on file  Tobacco Use   Smoking status: Former    Types: Cigarettes    Quit date: 09/10/2016    Years since quitting: 5.9   Smokeless tobacco: Never  Vaping Use   Vaping Use: Never used  Substance and Sexual Activity   Alcohol use: No   Drug use: No   Sexual activity: Not on file  Other Topics Concern   Not on file  Social History Narrative   Not on file   Social  Determinants of Health   Financial Resource Strain: Not on file  Food Insecurity: No Food Insecurity (08/11/2022)   Hunger Vital Sign    Worried About Running Out of Food in the Last Year: Never true    Ran Out of Food in the Last Year: Never true  Transportation Needs: No Transportation Needs (08/11/2022)   PRAPARE - Administrator, Civil Service (Medical): No    Lack of Transportation (Non-Medical): No  Physical Activity: Not on file  Stress: Not on file  Social Connections: Not on file   Additional Social History:   Sleep: Poor  Appetite:  Fair  Current Medications: Current Facility-Administered Medications  Medication Dose Route Frequency Provider Last Rate Last Admin   acetaminophen (TYLENOL) tablet 650 mg  650 mg Oral Q6H PRN [name], MD   650 mg at 08/21/22 0935   albuterol (VENTOLIN HFA) 108 (90 Base) MCG/ACT inhaler 2 puff  2 puff Inhalation Q4H PRN [name], MD       alum & mag hydroxide-simeth (MAALOX/MYLANTA) 200-200-20 MG/5ML suspension 30 mL  30 mL Oral Q4H PRN [name], MD       docusate sodium (COLACE) capsule 100 mg  100 mg Oral BID Massengill, [name], MD   100 mg at 08/24/22 0748   FLUoxetine (PROZAC) capsule 40 mg  40 mg Oral Daily Massengill, [name], MD   40 mg at 08/24/22 0748   hydrOXYzine (ATARAX) tablet 25 mg  25 mg Oral TID PRN [name], MD   25 mg at 08/23/22 1756   lamoTRIgine (LAMICTAL) tablet 200 mg  200 mg Oral Daily Massengill, [name], MD   200 mg at 08/24/22 0748   LORazepam (ATIVAN) tablet 1 mg  1 mg Oral Q12H Massengill, Nathan, MD   1 mg at 08/24/22 0751   magnesium hydroxide (MILK OF MAGNESIA) suspension 30 mL  30 mL Oral Daily PRN [name], MD       melatonin tablet 5 mg  5 mg Oral QHS Massengill, [name], MD   5 mg at 08/23/22 2134   metFORMIN (GLUCOPHAGE-XR) 24 hr tablet 500 mg  500 mg Oral Q breakfast Massengill, [name], MD   500 mg at 08/24/22 0749   NIFEdipine (ADALAT CC) 24 hr tablet 90 mg  90 mg Oral [name], MD    90 mg at 08/23/22 2134   OLANZapine zydis (ZYPREXA) disintegrating tablet 20 mg  20 mg Oral QHS Massengill, Nathan, MD       polyethylene glycol (MIRALAX / GLYCOLAX) packet 17 g  17 g Oral Daily Massengill, [name], MD   17 g at 08/22/22 0731   propranolol ER (INDERAL  LA) 24 hr capsule 60 mg  60 mg Oral Daily Massengill, Nathan, MD   60 mg at 08/24/22 0749   topiramate (TOPAMAX) tablet 25 mg  25 mg Oral Q12H Massengill, [name], MD   25 mg at 08/24/22 0750   traZODone (DESYREL) tablet 50 mg  50 mg Oral QHS PRN [name], MD   50 mg at 08/20/22 2055   Lab Results:  No results found for this or any previous visit (from the past 48 hour(s)).   Blood Alcohol level:  Lab Results  Component Value Date   ETH <10 08/10/2022   Metabolic Disorder Labs: Lab Results  Component Value Date   HGBA1C 5.8 (H) 08/10/2022   MPG 120 08/10/2022   No results found for: "PROLACTIN" Lab Results  Component Value Date   CHOL 202 (H) 08/12/2022   TRIG 154 (H) 08/12/2022   HDL 50 08/12/2022   CHOLHDL 4.0 08/12/2022   VLDL 31 08/12/2022   LDLCALC 121 (H) 08/12/2022   Physical Findings: AIMS: 0 CIWA: n/a COWS:n/a  Musculoskeletal: Strength & Muscle Tone: within normal limits Gait & Station: normal Patient leans: N/A  Psychiatric Specialty Exam:  Presentation  General Appearance:  Fairly Groomed; Appropriate for Environment  Eye Contact: Good  Speech: Clear and Coherent  Speech Volume: Normal  Handedness: Right  Mood and Affect  Mood: Depressed  Affect: Congruent  Thought Process  Thought Processes: Coherent  Descriptions of Associations:Intact  Orientation:Full (Time, Place and Person)  Thought Content:Logical  History of Schizophrenia/Schizoaffective disorder:No  Duration of Psychotic Symptoms:No data recorded Hallucinations:Hallucinations: None Description of Auditory Hallucinations: -- (Denies) Description of Visual Hallucinations: -- (Denies)  Ideas of  Reference:None  Suicidal Thoughts:Suicidal Thoughts: No SI Active Intent and/or Plan: With Intent; With Plan; Without Means to Carry Out SI Passive Intent and/or Plan: -- (Denies)  Homicidal Thoughts:Homicidal Thoughts: Yes, Active HI Active Intent and/or Plan: Without Intent; With Plan HI Passive Intent and/or Plan: Without Intent; With Plan   Sensorium  Memory: Immediate Good  Judgment: Fair  Insight: Fair  Art therapist  Concentration: Fair  Attention Span: Fair  Recall: Jennelle Human of Knowledge: Fair  Language: Fair  Psychomotor Activity  Psychomotor Activity: Psychomotor Activity: Normal  Assets  Assets: Housing; Social Support  Sleep  Sleep: Sleep: Good Number of Hours of Sleep: 8.25  Physical Exam: Physical Exam Vitals and nursing note reviewed.  Constitutional:      Appearance: Normal appearance.  HENT:     Head: Normocephalic.     Nose: Nose normal. No congestion or rhinorrhea.  Eyes:     Pupils: Pupils are equal, round, and reactive to light.  Cardiovascular:     Rate and Rhythm: Normal rate.     Pulses: Normal pulses.  Pulmonary:     Effort: Pulmonary effort is normal.  Genitourinary:    Comments: Deferred Musculoskeletal:        General: Normal range of motion.     Cervical back: Normal range of motion.  Skin:    General: Skin is warm.  Neurological:     Mental Status: She is alert and oriented to person, place, and time.  Psychiatric:        Mood and Affect: Mood normal.        Behavior: Behavior normal.    Review of Systems  Constitutional:  Negative for chills and fever.  HENT: Negative.  Negative for hearing loss.   Eyes: Negative.  Negative for blurred vision.  Respiratory: Negative.  Negative for  cough.   Cardiovascular: Negative.  Negative for chest pain.  Gastrointestinal: Negative.   Genitourinary: Negative.   Musculoskeletal: Negative.   Skin: Negative.  Negative for rash.  Neurological: Negative.   Negative for dizziness and headaches.  Psychiatric/Behavioral:  Positive for depression and suicidal ideas. Negative for hallucinations, memory loss and substance abuse. The patient is nervous/anxious. The patient does not have insomnia.    Blood pressure 106/81, pulse 97, temperature 98.4 F (36.9 C), temperature source Oral, resp. rate 16, height 5\' 5"  (1.651 m), weight 91.6 kg, SpO2 95 %. Body mass index is 33.61 kg/m.  Treatment Plan Summary: Daily contact with patient to assess and evaluate symptoms and progress in treatment and Medication management   Observation Level/Precautions:  15 minute checks  Laboratory:  Labs reviewed   Psychotherapy:  Unit Group sessions  Medications:  See [facility]  Consultations:  To be determined   Discharge Concerns:  Safety, medication compliance, mood stability  Estimated LOS: 5-7 days  Other:  N/A    Labs Reviewed independently on 08/24/2022:  CMP WNL, Lipid panel reviewed (cholesterol 202, LDL-121, Triglycerides 154-educated on healthy food choices and exercise. HA1C is 5.8 rendering pt a prediabetic-will need PCP f/u after dc. TSH WNL. UA hazy with rare bacteria otherwise WNL. EKG from 12/04 with QTC-445.    PLAN Safety and Monitoring: Voluntary admission to inpatient psychiatric unit for safety, stabilization and treatment Daily contact with patient to assess and evaluate symptoms and progress in treatment Patient's case to be discussed in multi-disciplinary team meeting Observation Level : q15 minute checks Vital signs: q12 hours Precautions: Safety   Long Term Goal(s): Improvement in symptoms so as ready for discharge   Short Term Goals: Ability to identify changes in lifestyle to reduce recurrence of condition will improve, Ability to verbalize feelings will improve, Ability to disclose and discuss suicidal ideas, Ability to demonstrate self-control will improve, Ability to identify and develop effective coping behaviors will improve, and  Compliance with prescribed medications will improve   Diagnoses:  Active Problems:   Asthma   Bipolar I disorder, most recent episode depressed, severe without psychotic features (HCC)   Anxiety   Insomnia   Essential hypertension   Medications -Continue Prozac 40 mg daily for depressive symptoms -Increase Zyprexa from 15 to 20 mg nightly for mood stabilization/intrusive thoughts  -Continue Lamictal 200 mg daily for mood stabilization (home med) -Continue Ativan to 1 mg BID for catatonia (PLEASE DO NOT DISCONTINUE THIS ORDER) -Continue Inderal LA 60 mg daily for anxiety/tachycardia (home med) -Discontinue Seroquel 300 mg nightly d/t lack of efficacy (08/17/22) -Discontinue Seroquel 100 mg at bedtime PRN (08/17/22) -Continue Nifedipine 90 mg Q HS for hypertension (home med) -Continue Hydroxyzine 25 mg every 6 hours PRN -Continue Albuterol PRN Q 4 H for wheezing/SOB -Given Ativan 2 mg IM x 0ne dose for catatonia on 12/06-completed with positive response  noted an hour after medication administration (smiling and talking more coherently) -Continue Ensure Nutritional shakes TID in between meals for nutritional support -Continue Trazodone 50 mg nightly and 50 mg PRN for insomnia  Other PRNS -Continue Tylenol 650 mg every 6 hours PRN for mild pain -Continue Maalox 30 mg every 4 hrs PRN for indigestion -Continue Milk of Magnesia as needed every 6 hrs for constipation   Discharge Planning: Social work and case management to assist with discharge planning and identification of hospital follow-up needs prior to discharge Estimated LOS: 5-7 days Discharge Concerns: Need to establish a safety plan; Medication compliance and  effectiveness Discharge Goals: Return home with outpatient referrals for mental health follow-up including medication management/psychotherapy   Starleen Blueoris  Anhelica Fowers, NP 08/24/2022, 1:43 PMPatient ID: Barnet PallKristen B Hunsinger, female   DOB: 09/09/1985, 36 y.o.   MRN: [id number] Patient ID:  Barnet PallKristen B Cislo, Patient ID: Barnet PallKristen B Caponigro, female   DOB: 08/13/1986, 36 y.o.   MRN: [id number] Patient ID: Barnet PallKristen B Ellen, female   DOB: 06/16/1986, 36 y.o.   MRN: [id number] Patient ID: Barnet PallKristen B Stucker, female   DOB: 04/19/1986, 36 y.o.   MRN: [id number] Patient ID: Barnet PallKristen B Pawelski, female   DOB: 06/11/1986, 36 y.o.   MRN: [id number]

## 2022-08-24 NOTE — B[facility] Group Notes (Signed)
Spiritual care group on grief and loss facilitated by Chaplain Dyanne Carrel, BCC, and Chaplain Genesis Adams   Group Goal:  Support / Education around grief and loss  Members engage in facilitated group support and psycho-social education.  Group Description:  Following introductions and group rules, group members engaged in facilitated group dialog and support around topic of loss, with particular support around experiences of loss in their lives. Group Identified types of loss (relationships / self / things) and identified patterns, circumstances, and changes that precipitate losses. Reflected on thoughts / feelings around loss, normalized grief responses, and recognized variety in grief experience.  Group drew on Adlerian / Rogerian, narrative, MI,  Patient Progress: Did not attend due to infection prevention.

## 2022-08-24 NOTE — Progress Notes (Signed)
Patient stated she slept 8 hours last night.  Denied A/V hallucinations.  Stated she continues to feel SI and HI, no changes.  Contracts for safety at this time.

## 2022-08-24 NOTE — BHH Counselor (Signed)
CSW spoke with Victorino Dike at [facility] [phone], who completed a CPS report.  CSW will receive a letter in 1 to 2 weeks noting if the case was accepted or denied.

## 2022-08-24 NOTE — B[facility] Group Notes (Signed)
Adult Psychoeducational Group Note   Date:  08/24/2022 Time:  9:23 AM   Group Topic/Focus:    Emotional Education:   The focus of this group is to discuss what feelings/emotions are, and how they are experienced.   Goals Group:   The focus of this group is to help patients establish daily goals to achieve during treatment and discuss how the patient can incorporate goal setting into their daily lives to aide in recovery.   Healthy Communication:   The focus of this group is to discuss communication, barriers to communication, as well as healthy ways to communicate with others.   Participation Level:  Did not participate   Participation Quality: Did not participate   Affect:  Did not participate   Cognitive:  Did not participate   Insight: Did not participate   Engagement in Group:  Did not participate   Modes of Intervention:  Discussion, Education, and Exploration   Additional Comments: Patient did not attend group. Invited but did not attend   [place] R  RN           

## 2022-08-24 NOTE — Progress Notes (Signed)
   08/24/22 0700  Sleep  Number of Hours 9

## 2022-08-25 DIAGNOSIS — F3160 Bipolar disorder, current episode mixed, unspecified: Secondary | ICD-10-CM | POA: Diagnosis not present

## 2022-08-25 MED ORDER — NIFEDIPINE ER 30 MG PO TB24
30.0000 mg | ORAL_TABLET | Freq: Every day | ORAL | Status: AC
  Administered 2022-08-25: 30 mg via ORAL
  Filled 2022-08-25: qty 1

## 2022-08-25 MED ORDER — MELATONIN 5 MG PO TABS
5.0000 mg | ORAL_TABLET | Freq: Two times a day (BID) | ORAL | Status: DC
  Administered 2022-08-25 – 2022-08-27 (×5): 5 mg via ORAL
  Filled 2022-08-25 (×10): qty 1

## 2022-08-25 NOTE — Group Note (Signed)
LCSW Group Therapy Note   Group Date: 08/25/2022 Start Time: 1200 End Time: 1300   Type of Therapy and Topic:  Group Therapy: Challenging Core Beliefs  Participation Level:  Did Not Attend  Description of Group:  Patients were educated about core beliefs and asked to identify one harmful core belief that they have. Patients were asked to explore from where those beliefs originate. Patients were asked to discuss how those beliefs make them feel and the resulting behaviors of those beliefs. They were then be asked if those beliefs are true and, if so, what evidence they have to support them. Lastly, group members were challenged to replace those negative core beliefs with helpful beliefs.   Therapeutic Goals:   1. Patient will identify harmful core beliefs and explore the origins of such beliefs. 2. Patient will identify feelings and behaviors that result from those core beliefs. 3. Patient will discuss whether such beliefs are true. 4.  Patient will replace harmful core beliefs with helpful ones.  Therapeutic Modalities: Cognitive Behavioral Therapy; Solution-Focused Therapy   Ane Payment, LCSW 08/25/2022  2:46 PM

## 2022-08-25 NOTE — Progress Notes (Addendum)
[facility] MD Progress Note  08/25/2022 5:17 PM Kara Stephens  MRN:  [id number] Principal Problem: Bipolar 1 disorder, mixed (HCC) Diagnosis: Active Problems:   Asthma   Bipolar I disorder, most recent episode depressed, severe without psychotic features (HCC)   Anxiety   Insomnia   Essential hypertension   COVID-19  Reason For Admission: Kara Stephens is a 36 yo Caucasian female with a prior mental health history of bipolar d/o who presented to the [facility] urgent care [facility]) accompanied by her husband on 12/4 with complaints of worsening depressive symptoms with a plan to jump off their [address] storey apartment building & intrusive thoughts to harm her 27 month old baby. Pt was transferred voluntarily to this [facility] for treatment and stabilization of her mood.    24 hr chart review: V/S for the past 24 hrs have been WNL. Pt has been compliant with all scheduled medications. She last required Hydroxyzine 25 mg for anxiety last night. She remains on contact & droplet precautions related to Covid 19 infection.   Today's assessment: Mood depressed and affect is congruent, but current presentation is an improvement from presentation on admission. Pt continues to endorse +SI, reports that her plan remains to jump off the balcony of her 2nd floor apartment. She continues to report intrusive thoughts to throw her 24 month old baby over the balcony of her 2nd floor apartment building, but states that she does not want to harm her baby, but these thoughts will not go away. Pt's husband (Mr Feuerborn called at [phone]) to ascertain if there is someone else outside of the home who can take care of the baby in the event that pt is discharged in the next week or so, but there was no answer. Will make another attempt tomorrow to reach out to pt's husband.  Pt reports that her sleep quality last night was fair, but it might be because she is sleeping most of the day as she is in her room  due to contact and airborne precautions related to Covid 19 infection. She reports a good appetite. She continues to deny any current medication related side effects. She denies being in any physical distress. No TD/EPS type symptoms found on assessment, and pt denies any feelings of stiffness. AIMS: 0.   We are discontinuing nifedipine as this medication might be contributing to her intrusive thoughts & worsening of depressive symptoms. Pt will take the last dose of 30 mg tonight.  Total Time spent with patient: 45 minutes  Past Psychiatric History: Bipolar d/o  Past Medical History:  Past Medical History:  Diagnosis Date   Anxiety    Back spasm    Bipolar I disorder (HCC)    Hypertension    Tachycardia     Past Surgical History:  Procedure Laterality Date   APPENDECTOMY     CHOLECYSTECTOMY     HAND SURGERY     SHOULDER SURGERY     Family History:  Family History  Problem Relation Age of Onset   Hypertension Mother    Hyperlipidemia Mother    Suicidality Father    Depression Father    Family Psychiatric  History: See above Social History:  Social History   Substance and Sexual Activity  Alcohol Use No     Social History   Substance and Sexual Activity  Drug Use No    Social History   Socioeconomic History   Marital status: Married    Spouse name:  Not on file   Number of children: Not on file   Years of education: Not on file   Highest education level: Not on file  Occupational History   Not on file  Tobacco Use   Smoking status: Former    Types: Cigarettes    Quit date: 09/10/2016    Years since quitting: 5.9   Smokeless tobacco: Never  Vaping Use   Vaping Use: Never used  Substance and Sexual Activity   Alcohol use: No   Drug use: No   Sexual activity: Not on file  Other Topics Concern   Not on file  Social History Narrative   Not on file   Social Determinants of Health   Financial Resource Strain: Not on file  Food Insecurity: No Food  Insecurity (08/11/2022)   Hunger Vital Sign    Worried About Running Out of Food in the Last Year: Never true    Ran Out of Food in the Last Year: Never true  Transportation Needs: No Transportation Needs (08/11/2022)   PRAPARE - Administrator, Civil Service (Medical): No    Lack of Transportation (Non-Medical): No  Physical Activity: Not on file  Stress: Not on file  Social Connections: Not on file   Additional Social History:   Sleep: Poor  Appetite:  Fair  Current Medications: Current Facility-Administered Medications  Medication Dose Route Frequency Provider Last Rate Last Admin   acetaminophen (TYLENOL) tablet 650 mg  650 mg Oral Q6H PRN [name], MD   650 mg at 08/21/22 0935   albuterol (VENTOLIN HFA) 108 (90 Base) MCG/ACT inhaler 2 puff  2 puff Inhalation Q4H PRN [name], MD       alum & mag hydroxide-simeth (MAALOX/MYLANTA) 200-200-20 MG/5ML suspension 30 mL  30 mL Oral Q4H PRN [name], MD       docusate sodium (COLACE) capsule 100 mg  100 mg Oral BID Massengill, [name], MD   100 mg at 08/25/22 1704   FLUoxetine (PROZAC) capsule 60 mg  60 mg Oral Daily Massengill, [name], MD   60 mg at 08/25/22 9326   hydrOXYzine (ATARAX) tablet 25 mg  25 mg Oral TID PRN [name], MD   25 mg at 08/24/22 2108   lamoTRIgine (LAMICTAL) tablet 200 mg  200 mg Oral Daily Massengill, [name], MD   200 mg at 08/25/22 0840   LORazepam (ATIVAN) tablet 1 mg  1 mg Oral Q8H Massengill, Nathan, MD   1 mg at 08/25/22 1518   magnesium hydroxide (MILK OF MAGNESIA) suspension 30 mL  30 mL Oral Daily PRN [name], MD       melatonin tablet 5 mg  5 mg Oral Q12H Massengill, Nathan, MD       memantine [facility]) tablet 5 mg  5 mg Oral Daily Massengill, Nathan, MD   5 mg at 08/25/22 7124   metFORMIN (GLUCOPHAGE-XR) 24 hr tablet 500 mg  500 mg Oral Q breakfast Massengill, [name], MD   500 mg at 08/25/22 5809   NIFEdipine (ADALAT CC) 24 hr tablet 30 mg  30 mg Oral QHS Massengill, Nathan, MD        OLANZapine zydis (ZYPREXA) disintegrating tablet 20 mg  20 mg Oral QHS Massengill, [name], MD   20 mg at 08/24/22 2108   polyethylene glycol (MIRALAX / GLYCOLAX) packet 17 g  17 g Oral Daily Massengill, [name], MD   17 g at 08/25/22 0839   propranolol ER (INDERAL LA) 24 hr capsule 60 mg  60 mg Oral Daily Massengill, Nathan, MD   60 mg at 08/25/22 0919   topiramate (TOPAMAX) tablet 25 mg  25 mg Oral Q12H Massengill, Nathan, MD   25 mg at 08/25/22 0840   traZODone (DESYREL) tablet 50 mg  50 mg Oral QHS PRN Phineas InchesMassengill, Nathan, MD   50 mg at 08/20/22 2055   Lab Results:  No results found for this or any previous visit (from the past 48 hour(s)).   Blood Alcohol level:  Lab Results  Component Value Date   ETH <10 08/10/2022   Metabolic Disorder Labs: Lab Results  Component Value Date   HGBA1C 5.8 (H) 08/10/2022   MPG 120 08/10/2022   No results found for: "PROLACTIN" Lab Results  Component Value Date   CHOL 202 (H) 08/12/2022   TRIG 154 (H) 08/12/2022   HDL 50 08/12/2022   CHOLHDL 4.0 08/12/2022   VLDL 31 08/12/2022   LDLCALC 121 (H) 08/12/2022   Physical Findings: AIMS: 0 CIWA: n/a COWS:n/a  Musculoskeletal: Strength & Muscle Tone: within normal limits Gait & Station: normal Patient leans: N/A  Psychiatric Specialty Exam:  Presentation  General Appearance:  Appropriate for Environment; Fairly Groomed  Eye Contact: Good  Speech: Clear and Coherent  Speech Volume: Normal  Handedness: Right  Mood and Affect  Mood: Depressed  Affect: Congruent  Thought Process  Thought Processes: Coherent  Descriptions of Associations:Intact  Orientation:Full (Time, Place and Person)  Thought Content:Logical  History of Schizophrenia/Schizoaffective disorder:No  Duration of Psychotic Symptoms:No data recorded Hallucinations:Hallucinations: None  Ideas of Reference:None  Suicidal Thoughts:Suicidal Thoughts: Yes, Active SI Active Intent and/or Plan: Without  Intent; With Plan  Homicidal Thoughts:Homicidal Thoughts: Yes, Active HI Active Intent and/or Plan: Without Intent; With Plan HI Passive Intent and/or Plan: Without Intent; With Plan   Sensorium  Memory: Immediate Good  Judgment: Fair  Insight: Fair  Executive Functions  Concentration: Good  Attention Span: Good  Recall: Kara HumanFair  Fund of Knowledge: Fair  Language: Fair  Psychomotor Activity  Psychomotor Activity: Psychomotor Activity: Normal  Assets  Assets: Housing; Social Support; Resilience  Sleep  Sleep: Sleep: Good  Physical Exam: Physical Exam Vitals and nursing note reviewed.  Constitutional:      Appearance: Normal appearance.  HENT:     Head: Normocephalic.     Nose: Nose normal. No congestion or rhinorrhea.  Eyes:     Pupils: Pupils are equal, round, and reactive to light.  Cardiovascular:     Rate and Rhythm: Normal rate.     Pulses: Normal pulses.  Pulmonary:     Effort: Pulmonary effort is normal.  Genitourinary:    Comments: Deferred Musculoskeletal:        General: Normal range of motion.     Cervical back: Normal range of motion.  Skin:    General: Skin is warm.  Neurological:     Mental Status: She is alert and oriented to person, place, and time.  Psychiatric:        Mood and Affect: Mood normal.        Behavior: Behavior normal.    Review of Systems  Constitutional:  Negative for chills and fever.  HENT: Negative.  Negative for hearing loss.   Eyes: Negative.  Negative for blurred vision.  Respiratory: Negative.  Negative for cough.   Cardiovascular: Negative.  Negative for chest pain.  Gastrointestinal: Negative.   Genitourinary: Negative.   Musculoskeletal: Negative.   Skin: Negative.  Negative for rash.  Neurological: Negative.  Negative for  dizziness and headaches.  Psychiatric/Behavioral:  Positive for depression and suicidal ideas. Negative for hallucinations, memory loss and substance abuse. The patient is  nervous/anxious. The patient does not have insomnia.    Blood pressure 121/82, pulse 94, temperature 98.4 F (36.9 C), temperature source Oral, resp. rate 16, height 5\' 5"  (1.651 m), weight 91.6 kg, SpO2 99 %. Body mass index is 33.61 kg/m.  Treatment Plan Summary: Daily contact with patient to assess and evaluate symptoms and progress in treatment and Medication management   Observation Level/Precautions:  15 minute checks  Laboratory:  Labs reviewed   Psychotherapy:  Unit Group sessions  Medications:  See [facility]  Consultations:  To be determined   Discharge Concerns:  Safety, medication compliance, mood stability  Estimated LOS: 5-7 days  Other:  N/A    Labs Reviewed independently on 08/24/2022:  CMP WNL, Lipid panel reviewed (cholesterol 202, LDL-121, Triglycerides 154-educated on healthy food choices and exercise. HA1C is 5.8 rendering pt a prediabetic-will need PCP f/u after dc. TSH WNL. UA hazy with rare bacteria otherwise WNL. EKG from 12/04 with QTC-445.    PLAN Safety and Monitoring: Voluntary admission to inpatient psychiatric unit for safety, stabilization and treatment Daily contact with patient to assess and evaluate symptoms and progress in treatment Patient's case to be discussed in multi-disciplinary team meeting Observation Level : q15 minute checks Vital signs: q12 hours Precautions: Safety   Long Term Goal(s): Improvement in symptoms so as ready for discharge   Short Term Goals: Ability to identify changes in lifestyle to reduce recurrence of condition will improve, Ability to verbalize feelings will improve, Ability to disclose and discuss suicidal ideas, Ability to demonstrate self-control will improve, Ability to identify and develop effective coping behaviors will improve, and Compliance with prescribed medications will improve   Diagnoses:  Active Problems:   Asthma   Bipolar I disorder, most recent episode depressed, severe without psychotic features (HCC)    Anxiety   Insomnia   Essential hypertension   Medications -Continue Prozac 40 mg daily for depressive symptoms -Continue Zyprexa 20 mg nightly for mood stabilization/intrusive thoughts  -Continue Lamictal 200 mg daily for mood stabilization (home med) -Continue Ativan to 1 mg BID for catatonia (PLEASE DO NOT DISCONTINUE THIS ORDER) -Continue Inderal LA 60 mg daily for anxiety/tachycardia (home med) -Discontinue Seroquel 300 mg nightly d/t lack of efficacy (08/17/22) -Discontinue Seroquel 100 mg at bedtime PRN (08/17/22) -Tapering off Nifedipine -Give last dose of 30 mg tonight  (home med) -Continue Hydroxyzine 25 mg every 6 hours PRN -Continue Albuterol PRN Q 4 H for wheezing/SOB -Given Ativan 2 mg IM x 0ne dose for catatonia on 12/06-completed with positive response  noted an hour after medication administration (smiling and talking more coherently) -Continue Ensure Nutritional shakes TID in between meals for nutritional support -Continue Trazodone 50 mg nightly and 50 mg PRN for insomnia  Other PRNS -Continue Tylenol 650 mg every 6 hours PRN for mild pain -Continue Maalox 30 mg every 4 hrs PRN for indigestion -Continue Milk of Magnesia as needed every 6 hrs for constipation   Discharge Planning: Social work and case management to assist with discharge planning and identification of hospital follow-up needs prior to discharge Estimated LOS: 5-7 days Discharge Concerns: Need to establish a safety plan; Medication compliance and effectiveness Discharge Goals: Return home with outpatient referrals for mental health follow-up including medication management/psychotherapy   14/11/23, NP 08/25/2022, 5:17 PMPatient ID: 08/27/2022, female   DOB: 1986/09/05, 36 y.o.  MRN: [id number] Patient ID: Kara Stephens, Patient ID: Kara Stephens, female

## 2022-08-25 NOTE — Progress Notes (Signed)
   08/25/22 2300  Psych Admission Type (Psych Patients Only)  Admission Status Voluntary  Psychosocial Assessment  Patient Complaints Depression  Eye Contact Fair  Facial Expression Flat  Affect Depressed  Speech Logical/coherent  Interaction Assertive  Motor Activity Slow  Appearance/Hygiene Unremarkable  Behavior Characteristics Cooperative  Mood Depressed  Thought Process  Coherency WDL  Content WDL  Delusions None reported or observed  Perception WDL  Hallucination None reported or observed  Judgment Impaired  Confusion None  Danger to Self  Current suicidal ideation? Passive  Description of Suicide Plan reports feeling si/hi  Agreement Not to Harm Self Yes  Description of Agreement verbal  Danger to Others  Danger to Others Reported or observed  Danger to Others Abnormal  Description of Harmful Behavior thoughts of harmful actions towards child

## 2022-08-25 NOTE — Progress Notes (Signed)
Pt in room this morning. Pt remains in room due to positive covid status. Pt endorses suicidal and homicidal thoughts ongoing. Pt reports plan to kill self includes jumping off of her apartment balcony. Pt reports continued thoughts to harm her baby. Pt hoping to be out of isolation soon noting it is making her mental health worse. Pt remains medication compliant. Pt denies covid symptoms.

## 2022-08-25 NOTE — [facility] Group Notes (Signed)
Pt did not attend group this evening due the pt being on room precautions.  

## 2022-08-25 NOTE — Group Note (Addendum)
Recreation Therapy Group Note   Group Topic:Animal Assisted Therapy   Group Date: 08/25/2022 Start Time: 1430 End Time: 1515 Facilitators: Khalaya Mcgurn-McCall, LRT,CTRS Location: [address]   Animal-Assisted Activity (AAA) Program Checklist/Progress Notes Patient Eligibility Criteria Checklist & Daily Group note for Rec Tx Intervention  AAA/T Program Assumption of Risk Form signed by Patient/ or Parent Legal Guardian Yes  Patient understands his/her participation is voluntary Yes   Affect/Mood: N/A   Participation Level: Did not attend    Clinical Observations/Individualized Feedback:      Plan: Continue to engage patient in RT group sessions 2-3x/week.   Anora Schwenke-McCall, LRT,CTRS  08/25/2022 3:32 PM

## 2022-08-25 NOTE — Progress Notes (Signed)
Adult Psychoeducational Group Note  Date:  08/25/2022 Time:  2:58 PM  Pt did not attend the orientation group.

## 2022-08-26 ENCOUNTER — Encounter (HOSPITAL_COMMUNITY): Payer: Self-pay

## 2022-08-26 DIAGNOSIS — F3160 Bipolar disorder, current episode mixed, unspecified: Secondary | ICD-10-CM | POA: Diagnosis not present

## 2022-08-26 MED ORDER — MEMANTINE HCL 5 MG PO TABS
5.0000 mg | ORAL_TABLET | Freq: Two times a day (BID) | ORAL | Status: DC
  Administered 2022-08-26 – 2022-08-27 (×2): 5 mg via ORAL
  Filled 2022-08-26 (×4): qty 1

## 2022-08-26 NOTE — [facility] Group Notes (Signed)
Patient did not attend goals group.  

## 2022-08-26 NOTE — Progress Notes (Signed)
Patient stayed in her room today.  Did not want new dressing applied today.

## 2022-08-26 NOTE — [facility] Group Notes (Signed)
Patient did not attend the NA group. 

## 2022-08-26 NOTE — Group Note (Signed)
Recreation Therapy Group Note   Group Topic:Stress Management  Group Date: 08/26/2022 Start Time: 0935 End Time: 0952 Facilitators: Nyisha Clippard-McCall, LRT,CTRS Location: [address]   Goal Area(s) Addresses:  Patient will actively participate in stress management techniques presented during session.  Patient will successfully identify benefit of practicing stress management post d/c.   Group Description: Guided Imagery. LRT provided education, instruction, and demonstration on practice of visualization via guided imagery. Patient was asked to participate in the technique introduced during session. LRT debriefed including topics of mindfulness, stress management and specific scenarios each patient could use these techniques. Patients were given suggestions of ways to access scripts post d/c and encouraged to explore Youtube and other apps available on smartphones, tablets, and computers.   Affect/Mood: N/A   Participation Level: Did not attend    Clinical Observations/Individualized Feedback:      Plan: Continue to engage patient in RT group sessions 2-3x/week.   Kara Stephens, LRT,CTRS 08/26/2022 12:09 PM

## 2022-08-26 NOTE — BH IP Treatment Plan (Signed)
Interdisciplinary Treatment and Diagnostic Plan Update  08/26/2022 Time of Session: 10:05am  Kara Stephens MRN: [id number]  Principal Diagnosis: Bipolar 1 disorder, mixed (HCC)  Secondary Diagnoses: Active Problems:   Asthma   Bipolar I disorder, most recent episode depressed, severe without psychotic features (HCC)   Anxiety   Insomnia   Essential hypertension   COVID-19   Current Medications:  Current Facility-Administered Medications  Medication Dose Route Frequency Provider Last Rate Last Admin   acetaminophen (TYLENOL) tablet 650 mg  650 mg Oral Q6H PRN [name], MD   650 mg at 08/21/22 0935   albuterol (VENTOLIN HFA) 108 (90 Base) MCG/ACT inhaler 2 puff  2 puff Inhalation Q4H PRN [name], MD       alum & mag hydroxide-simeth (MAALOX/MYLANTA) 200-200-20 MG/5ML suspension 30 mL  30 mL Oral Q4H PRN [name], MD       docusate sodium (COLACE) capsule 100 mg  100 mg Oral BID [name], MD   100 mg at 08/26/22 0931   FLUoxetine (PROZAC) capsule 60 mg  60 mg Oral Daily Massengill, [name], MD   60 mg at 08/26/22 0930   hydrOXYzine (ATARAX) tablet 25 mg  25 mg Oral TID PRN [name], MD   25 mg at 08/24/22 2108   lamoTRIgine (LAMICTAL) tablet 200 mg  200 mg Oral Daily Massengill, [name], MD   200 mg at 08/26/22 0932   LORazepam (ATIVAN) tablet 1 mg  1 mg Oral Q8H Massengill, [name], MD   1 mg at 08/26/22 3825   magnesium hydroxide (MILK OF MAGNESIA) suspension 30 mL  30 mL Oral Daily PRN [name], MD       melatonin tablet 5 mg  5 mg Oral Q12H Massengill, [name], MD   5 mg at 08/26/22 0931   memantine (NAMENDA) tablet 5 mg  5 mg Oral Daily Massengill, [name], MD   5 mg at 08/26/22 0539   metFORMIN (GLUCOPHAGE-XR) 24 hr tablet 500 mg  500 mg Oral Q breakfast Massengill, [name], MD   500 mg at 08/26/22 0933   OLANZapine zydis (ZYPREXA) disintegrating tablet 20 mg  20 mg Oral QHS Massengill, [name], MD   20 mg at 08/25/22 2021   polyethylene glycol (MIRALAX /  GLYCOLAX) packet 17 g  17 g Oral Daily Massengill, [name], MD   17 g at 08/26/22 7673   propranolol ER (INDERAL LA) 24 hr capsule 60 mg  60 mg Oral Daily Massengill, [name], MD   60 mg at 08/26/22 0953   topiramate (TOPAMAX) tablet 25 mg  25 mg Oral Q12H Massengill, [name], MD   25 mg at 08/26/22 4193   traZODone (DESYREL) tablet 50 mg  50 mg Oral QHS PRN [name], MD   50 mg at 08/20/22 2055   PTA Medications: Medications Prior to Admission  Medication Sig Dispense Refill Last Dose   albuterol (VENTOLIN HFA) 108 (90 Base) MCG/ACT inhaler Inhale 2 puffs into the lungs every 4 (four) hours as needed for wheezing or shortness of breath (cough, shortness of breath or wheezing.). (Patient taking differently: Inhale 2 puffs into the lungs every 4 (four) hours as needed for wheezing or shortness of breath.) 6.7 g 3    clonazePAM (KLONOPIN) 0.5 MG tablet Take 1 tablet (0.5 mg total) by mouth 2 (two) times daily. 30 tablet 0    lamoTRIgine (LAMICTAL) 150 MG tablet Take 1 tablet (150 mg total) by mouth daily.      NIFEdipine (PROCARDIA XL/NIFEDICAL-XL) 90 MG 24  hr tablet Take 90 mg by mouth at bedtime.      QUEtiapine (SEROQUEL) 100 MG tablet Take 1 tablet (100 mg total) by mouth at bedtime.      traZODone (DESYREL) 100 MG tablet Take 1 tablet (100 mg total) by mouth at bedtime as needed.       Patient Stressors: Financial difficulties   Health problems    Patient Strengths: Supportive family/friends   Treatment Modalities: Medication Management, Group therapy, Case management,  1 to 1 session with clinician, Psychoeducation, Recreational therapy.   Physician Treatment Plan for Primary Diagnosis: Bipolar 1 disorder, mixed (HCC) Long Term Goal(s): Improvement in symptoms so as ready for discharge   Short Term Goals: Ability to identify changes in lifestyle to reduce recurrence of condition will improve Ability to verbalize feelings will improve Ability to disclose and discuss suicidal  ideas Ability to demonstrate self-control will improve Ability to identify and develop effective coping behaviors will improve Compliance with prescribed medications will improve  Medication Management: Evaluate patient's response, side effects, and tolerance of medication regimen.  Therapeutic Interventions: 1 to 1 sessions, Unit Group sessions and Medication administration.  Evaluation of Outcomes: Progressing  Physician Treatment Plan for Secondary Diagnosis: Active Problems:   Asthma   Bipolar I disorder, most recent episode depressed, severe without psychotic features (HCC)   Anxiety   Insomnia   Essential hypertension   COVID-19  Long Term Goal(s): Improvement in symptoms so as ready for discharge   Short Term Goals: Ability to identify changes in lifestyle to reduce recurrence of condition will improve Ability to verbalize feelings will improve Ability to disclose and discuss suicidal ideas Ability to demonstrate self-control will improve Ability to identify and develop effective coping behaviors will improve Compliance with prescribed medications will improve     Medication Management: Evaluate patient's response, side effects, and tolerance of medication regimen.  Therapeutic Interventions: 1 to 1 sessions, Unit Group sessions and Medication administration.  Evaluation of Outcomes: Progressing   RN Treatment Plan for Primary Diagnosis: Bipolar 1 disorder, mixed (HCC) Long Term Goal(s): Knowledge of disease and therapeutic regimen to maintain health will improve  Short Term Goals: Ability to remain free from injury will improve, Ability to participate in decision making will improve, Ability to verbalize feelings will improve, Ability to disclose and discuss suicidal ideas, and Ability to identify and develop effective coping behaviors will improve  Medication Management: RN will administer medications as ordered by provider, will assess and evaluate patient's response  and provide education to patient for prescribed medication. RN will report any adverse and/or side effects to prescribing provider.  Therapeutic Interventions: 1 on 1 counseling sessions, Psychoeducation, Medication administration, Evaluate responses to treatment, Monitor vital signs and CBGs as ordered, Perform/monitor CIWA, COWS, AIMS and Fall Risk screenings as ordered, Perform wound care treatments as ordered.  Evaluation of Outcomes: Progressing   LCSW Treatment Plan for Primary Diagnosis: Bipolar 1 disorder, mixed (HCC) Long Term Goal(s): Safe transition to appropriate next level of care at discharge, Engage patient in therapeutic group addressing interpersonal concerns.  Short Term Goals: Engage patient in aftercare planning with referrals and resources, Increase social support, Increase emotional regulation, Facilitate acceptance of mental health diagnosis and concerns, Identify triggers associated with mental health/substance abuse issues, and Increase skills for wellness and recovery  Therapeutic Interventions: Assess for all discharge needs, 1 to 1 time with Social worker, Explore available resources and support systems, Assess for adequacy in community support network, Educate family and significant other(s) on  suicide prevention, Complete Psychosocial Assessment, Interpersonal group therapy.  Evaluation of Outcomes: Progressing   Progress in Treatment: Attending groups: Yes. Participating in groups: Yes. Taking medication as prescribed: Yes. Toleration medication: Yes. Family/Significant other contact made: No, will contact:  Reneka Nebergall [phone] (Husband)  Patient understands diagnosis: Yes. Discussing patient identified problems/goals with staff: Yes. Medical problems stabilized or resolved: Yes. Denies suicidal/homicidal ideation: Yes. Issues/concerns per patient self-inventory: No.     New problem(s) identified: No, Describe:  none reported    New Short  Term/Long Term Goal(s):  medication stabilization, elimination of SI thoughts, development of comprehensive mental wellness plan.      Patient Goals:  Pt continues to work on goals stated at initial tx team.  Care Team will continue to provide support and interventions to help reach goals.    Discharge Plan or Barriers: Pt has med management and therapy set up for discharge. No psychosocial barriers noted at this time.    Reason for Continuation of Hospitalization: Anxiety Depression Medication stabilization Suicidal ideation   Estimated Length of Stay: 5-7 days  Last 3 [place] Suicide Severity Risk Score: Flowsheet Row [facility] (Current[facility] Most recent reading at 08/14/2022  1:35 PM ED from 08/10/2022 in [facility] Most recent reading at 08/11/2022  7:44 AM ED from 08/10/2022 in [facility] Most recent reading at 08/10/2022  7:46 PM  C-SSRS RISK CATEGORY Low Risk High Risk Error: Q7 should not be populated when Q6 is No       Last PHQ 2/9 Scores:    08/10/2022    7:45 PM  Depression screen PHQ 2/9  Decreased Interest 3  Down, Depressed, Hopeless 3  PHQ - 2 Score 6  Altered sleeping 3  Tired, decreased energy 1  Change in appetite 3  Feeling bad or failure about yourself  1  Trouble concentrating 2  Moving slowly or fidgety/restless 2  Suicidal thoughts 2  PHQ-9 Score 20  Difficult doing work/chores Very difficult    Scribe for Treatment Team: Aram Beecham, Theresia Majors 08/26/2022 1:59 PM

## 2022-08-26 NOTE — Progress Notes (Signed)
Pt endorses continued suicidal and homicidal thoughts towards baby. Pt denies pain and covid symptoms. Pt complains of boredom related to room isolation from covid positive status. Pt reports showering yesterday.  Support and encouragement offered. Q 15 minute checks ongoing.

## 2022-08-26 NOTE — Progress Notes (Signed)
[facility] MD Progress Note  08/26/2022 5:56 PM Kara Stephens  MRN:  [id number] Principal Problem: Bipolar 1 disorder, mixed (HCC) Diagnosis: Active Problems:   Asthma   Bipolar I disorder, most recent episode depressed, severe without psychotic features (HCC)   Anxiety   Insomnia   Essential hypertension   COVID-19  Reason For Admission: Kara Stephens is a 36 yo Caucasian female with a prior mental health history of bipolar d/o who presented to the [facility] health urgent care [facility]) accompanied by her husband on 12/4 with complaints of worsening depressive symptoms with a plan to jump off their [address]nd storey apartment building & intrusive thoughts to harm her 66 month old baby. Pt was transferred voluntarily to this [facility] for treatment and stabilization of her mood.    24 hr chart review: V/S for the past 24 hrs have been WNL. Pt has been compliant with all scheduled medications. No PRN medications given overnight. She remains on contact & droplet precautions related to Covid 19 infection. Pt was able to go outside today for some fresh air.  Today's assessment: Mood is less depressed, attention to personal hygiene and grooming is fair, eye contact is good, speech is clear & coherent. Thought contents are organized and logical. Pt continues to state that her suicidal ideations are continuing; states that she has +SI with plan still to jump off the balcony of her 2nd floor apartment. She reports that her intrusive thoughts are still unchanged, and she has +HI to throw her 101 month old baby over the balcony of her second floor apartment. Writer asked pt what she thinks she would do if she were to be discharged home today, and she states: "Harm the baby".   Pt reports a good sleep quality last night, reports a good appetite, denies any current medication related side effects. No TD/EPS type symptoms found on assessment, and pt denies any feelings of stiffness. AIMS: 0. We are  continuing medications as listed below.  Writer called pt's husband  with pt's consent (Mr. Chasen at [phone]). 30 minutes were spent talking to pt's husband and educating him on pt's medications, as well as current mental status. Writer educated pt's husband on medications, and he verbalized concern regarding no changes in mental status, and also stated pt "has no emotions currently", which is concerning for him. Mr Neaves stated that he prefers for pt to be off the Zyprexa and to try another medication. Writer educated him that this will be discussed in treatment team tomorrow morning, and he verbalized understanding. Mr Cutbirth stated that if pt continues to verbalize thoughts of wanting to harm the baby, he will either have pt stay with her mother or with her sister after discharge and not be around baby for sometime. He also stated that they would like to visit pt, and wanted to know when they can visit. Writer educated him that pt remains on contact and airborne precautions related to Covid 19, and precautions will be lifted in 2 days (required to be in isolation x 10 days). Mr Abdo verbalized understanding and call ended.   Total Time spent with patient: 45 minutes  Past Psychiatric History: Bipolar d/o  Past Medical History:  Past Medical History:  Diagnosis Date   Anxiety    Back spasm    Bipolar I disorder (HCC)    Hypertension    Tachycardia     Past Surgical History:  Procedure Laterality Date   APPENDECTOMY  CHOLECYSTECTOMY     HAND SURGERY     SHOULDER SURGERY     Family History:  Family History  Problem Relation Age of Onset   Hypertension Mother    Hyperlipidemia Mother    Suicidality Father    Depression Father    Family Psychiatric  History: See above Social History:  Social History   Substance and Sexual Activity  Alcohol Use No     Social History   Substance and Sexual Activity  Drug Use No    Social History   Socioeconomic History    Marital status: Married    Spouse name: Not on file   Number of children: Not on file   Years of education: Not on file   Highest education level: Not on file  Occupational History   Not on file  Tobacco Use   Smoking status: Former    Types: Cigarettes    Quit date: 09/10/2016    Years since quitting: 5.9   Smokeless tobacco: Never  Vaping Use   Vaping Use: Never used  Substance and Sexual Activity   Alcohol use: No   Drug use: No   Sexual activity: Not on file  Other Topics Concern   Not on file  Social History Narrative   Not on file   Social Determinants of Health   Financial Resource Strain: Not on file  Food Insecurity: No Food Insecurity (08/11/2022)   Hunger Vital Sign    Worried About Running Out of Food in the Last Year: Never true    Ran Out of Food in the Last Year: Never true  Transportation Needs: No Transportation Needs (08/11/2022)   PRAPARE - Administrator, Civil Service (Medical): No    Lack of Transportation (Non-Medical): No  Physical Activity: Not on file  Stress: Not on file  Social Connections: Not on file   Additional Social History:   Sleep: Poor  Appetite:  Fair  Current Medications: Current Facility-Administered Medications  Medication Dose Route Frequency Provider Last Rate Last Admin   acetaminophen (TYLENOL) tablet 650 mg  650 mg Oral Q6H PRN [name], MD   650 mg at 08/21/22 0935   albuterol (VENTOLIN HFA) 108 (90 Base) MCG/ACT inhaler 2 puff  2 puff Inhalation Q4H PRN [name], MD       alum & mag hydroxide-simeth (MAALOX/MYLANTA) 200-200-20 MG/5ML suspension 30 mL  30 mL Oral Q4H PRN [name], MD       docusate sodium (COLACE) capsule 100 mg  100 mg Oral BID Massengill, [name], MD   100 mg at 08/26/22 1714   FLUoxetine (PROZAC) capsule 60 mg  60 mg Oral Daily Massengill, [name], MD   60 mg at 08/26/22 0930   hydrOXYzine (ATARAX) tablet 25 mg  25 mg Oral TID PRN [name], MD   25 mg at 08/24/22 2108   lamoTRIgine  (LAMICTAL) tablet 200 mg  200 mg Oral Daily Massengill, [name], MD   200 mg at 08/26/22 0932   LORazepam (ATIVAN) tablet 1 mg  1 mg Oral Q8H Massengill, Nathan, MD   1 mg at 08/26/22 1445   magnesium hydroxide (MILK OF MAGNESIA) suspension 30 mL  30 mL Oral Daily PRN [name], MD       melatonin tablet 5 mg  5 mg Oral Q12H Massengill, [name], MD   5 mg at 08/26/22 0931   memantine (NAMENDA) tablet 5 mg  5 mg Oral Daily Massengill, [name], MD   5 mg at  08/26/22 0933   metFORMIN (GLUCOPHAGE-XR) 24 hr tablet 500 mg  500 mg Oral Q breakfast Massengill, Nathan, MD   500 mg at 08/26/22 0933   OLANZapine zydis (ZYPREXA) disintegrating tablet 20 mg  20 mg Oral QHS Massengill, [name], MD   20 mg at 08/25/22 2021   polyethylene glycol (MIRALAX / GLYCOLAX) packet 17 g  17 g Oral Daily Massengill, Nathan, MD   17 g at 08/26/22 0929   propranolol ER (INDERAL LA) 24 hr capsule 60 mg  60 mg Oral Daily Massengill, [name], MD   60 mg at 08/26/22 0953   topiramate (TOPAMAX) tablet 25 mg  25 mg Oral Q12H Massengill, [name], MD   25 mg at 08/26/22 8110   traZODone (DESYREL) tablet 50 mg  50 mg Oral QHS PRN [name], MD   50 mg at 08/20/22 2055   Lab Results:  No results found for this or any previous visit (from the past 48 hour(s)).   Blood Alcohol level:  Lab Results  Component Value Date   ETH <10 08/10/2022   Metabolic Disorder Labs: Lab Results  Component Value Date   HGBA1C 5.8 (H) 08/10/2022   MPG 120 08/10/2022   No results found for: "PROLACTIN" Lab Results  Component Value Date   CHOL 202 (H) 08/12/2022   TRIG 154 (H) 08/12/2022   HDL 50 08/12/2022   CHOLHDL 4.0 08/12/2022   VLDL 31 08/12/2022   LDLCALC 121 (H) 08/12/2022   Physical Findings: AIMS: 0 CIWA: n/a COWS:n/a  Musculoskeletal: Strength & Muscle Tone: within normal limits Gait & Station: normal Patient leans: N/A  Psychiatric Specialty Exam:  Presentation  General Appearance:  Appropriate for Environment;  Fairly Groomed  Eye Contact: Good  Speech: Clear and Coherent  Speech Volume: Normal  Handedness: Right  Mood and Affect  Mood: Depressed  Affect: Congruent  Thought Process  Thought Processes: Coherent  Descriptions of Associations:Intact  Orientation:Full (Time, Place and Person)  Thought Content:Logical  History of Schizophrenia/Schizoaffective disorder:No  Duration of Psychotic Symptoms:No data recorded Hallucinations:Hallucinations: None  Ideas of Reference:None  Suicidal Thoughts:Suicidal Thoughts: Yes, Active SI Active Intent and/or Plan: With Plan; Without Intent  Homicidal Thoughts:Homicidal Thoughts: Yes, Active HI Active Intent and/or Plan: Without Intent; With Plan   Sensorium  Memory: Immediate Good  Judgment: Fair  Insight: Fair  Art therapist  Concentration: Fair  Attention Span: Fair  Recall: Jennelle Human of Knowledge: Fair  Language: Fair  Psychomotor Activity  Psychomotor Activity: Psychomotor Activity: Normal   Assets  Assets: Communication Skills  Sleep  Sleep: Sleep: Good  Physical Exam: Physical Exam Vitals and nursing note reviewed.  Constitutional:      Appearance: Normal appearance.  HENT:     Head: Normocephalic.     Nose: Nose normal. No congestion or rhinorrhea.  Eyes:     Pupils: Pupils are equal, round, and reactive to light.  Cardiovascular:     Rate and Rhythm: Normal rate.     Pulses: Normal pulses.  Pulmonary:     Effort: Pulmonary effort is normal.  Genitourinary:    Comments: Deferred Musculoskeletal:        General: Normal range of motion.     Cervical back: Normal range of motion.  Skin:    General: Skin is warm.  Neurological:     Mental Status: She is alert and oriented to person, place, and time.  Psychiatric:        Mood and Affect: Mood normal.  Behavior: Behavior normal.    Review of Systems  Constitutional:  Negative for chills and fever.  HENT:  Negative.  Negative for hearing loss.   Eyes: Negative.  Negative for blurred vision.  Respiratory: Negative.  Negative for cough.   Cardiovascular: Negative.  Negative for chest pain.  Gastrointestinal: Negative.   Genitourinary: Negative.   Musculoskeletal: Negative.   Skin: Negative.  Negative for rash.  Neurological: Negative.  Negative for dizziness and headaches.  Psychiatric/Behavioral:  Positive for depression and suicidal ideas. Negative for hallucinations, memory loss and substance abuse. The patient is nervous/anxious. The patient does not have insomnia.    Blood pressure 116/81, pulse 84, temperature 98.9 F (37.2 C), temperature source Oral, resp. rate 18, height  (1.651 m), weight 91.6 kg, SpO2 100 %. Body mass index is 33.61 kg/m.  Treatment Plan Summary: Daily contact with patient to assess and evaluate symptoms and progress in treatment and Medication management   Observation Level/Precautions:  15 minute checks  Laboratory:  Labs reviewed   Psychotherapy:  Unit Group sessions  Medications:  See [facility]  Consultations:  To be determined   Discharge Concerns:  Safety, medication compliance, mood stability  Estimated LOS: 5-7 days  Other:  N/A    Labs Reviewed independently on 08/24/2022:  CMP WNL, Lipid panel reviewed (cholesterol 202, LDL-121, Triglycerides 154-educated on healthy food choices and exercise. HA1C is 5.8 rendering pt a prediabetic-will need PCP f/u after dc. TSH WNL. UA hazy with rare bacteria otherwise WNL. EKG from 12/04 with QTC-445.    PLAN Safety and Monitoring: Voluntary admission to inpatient psychiatric unit for safety, stabilization and treatment Daily contact with patient to assess and evaluate symptoms and progress in treatment Patient's case to be discussed in multi-disciplinary team meeting Observation Level : q15 minute checks Vital signs: q12 hours Precautions: Safety   Long Term Goal(s): Improvement in symptoms so as ready for  discharge   Short Term Goals: Ability to identify changes in lifestyle to reduce recurrence of condition will improve, Ability to verbalize feelings will improve, Ability to disclose and discuss suicidal ideas, Ability to demonstrate self-control will improve, Ability to identify and develop effective coping behaviors will improve, and Compliance with prescribed medications will improve   Diagnoses:  Active Problems:   Asthma   Bipolar I disorder, most recent episode depressed, severe without psychotic features (HCC)   Anxiety   Insomnia   Essential hypertension   Medications  MDD/mood stabilization -Continue Prozac 60 mg daily for depressive symptoms -Continue Zyprexa 20 mg nightly for mood stabilization/intrusive thoughts  -Continue Lamictal 200 mg daily for mood stabilization (home med)  Catatonia -Continue Ativan to 1 mg TID for catatonia -Continue Namenda 5 mg BID  Anxiety -Nifedipine completely tapered off on 12/19 (home med) -Continue Hydroxyzine 25 mg every 6 hours PRN -Continue Albuterol PRN Q 4 H for wheezing/SOB -Given Ativan 2 mg IM x 0ne dose for catatonia on 12/06- -Continue Inderal LA 60 mg daily for anxiety/tachycardia (home med) -Discontinue Seroquel 300 mg nightly d/t lack of efficacy (08/17/22) -Discontinue Seroquel 100 mg at bedtime PRN (08/17/22)  Insomnia -Continue Trazodone 50 mg nightly and 50 mg PRN for insomnia  Other PRNS -Continue Tylenol 650 mg every 6 hours PRN for mild pain -Continue Maalox 30 mg every 4 hrs PRN for indigestion -Continue Milk of Magnesia as needed every 6 hrs for constipation   Discharge Planning: Social work and case management to assist with discharge planning and identification of hospital follow-up needs prior  to discharge Estimated LOS: 5-7 days Discharge Concerns: Need to establish a safety plan; Medication compliance and effectiveness Discharge Goals: Return home with outpatient referrals for mental health follow-up  including medication management/psychotherapy   Starleen Blueoris  Siegfried Vieth, NP 08/26/2022, 5:56 PMPatient ID: Barnet PallKristen B Stephens, female   DOB: 01/27/1986, 36 y.o.   MRN: [id number] Patient ID: Barnet PallKristen B Stephens, Patient ID: Barnet PallKristen B Stephens, female   Patient ID: Barnet PallKristen B Stephens, female   DOB: 09/14/1985, 89 y.o.   MRN: [id number]

## 2022-08-27 DIAGNOSIS — F3160 Bipolar disorder, current episode mixed, unspecified: Secondary | ICD-10-CM | POA: Diagnosis not present

## 2022-08-27 MED ORDER — TOPIRAMATE 25 MG PO TABS
50.0000 mg | ORAL_TABLET | Freq: Two times a day (BID) | ORAL | Status: DC
  Administered 2022-08-27 – 2022-09-04 (×16): 50 mg via ORAL
  Filled 2022-08-27 (×18): qty 2

## 2022-08-27 MED ORDER — CLOMIPRAMINE HCL 25 MG PO CAPS
25.0000 mg | ORAL_CAPSULE | Freq: Every day | ORAL | Status: DC
  Administered 2022-08-27 – 2022-08-28 (×2): 25 mg via ORAL
  Filled 2022-08-27 (×4): qty 1

## 2022-08-27 MED ORDER — MEMANTINE HCL 5 MG PO TABS: 5.0000 mg | ORAL_TABLET | Freq: Once | ORAL | Status: DC

## 2022-08-27 MED ORDER — QUETIAPINE FUMARATE ER 50 MG PO TB24
100.0000 mg | ORAL_TABLET | Freq: Every day | ORAL | Status: DC
  Administered 2022-08-27 – 2022-09-03 (×8): 100 mg via ORAL
  Filled 2022-08-27 (×10): qty 2

## 2022-08-27 MED ORDER — MEMANTINE HCL 10 MG PO TABS
10.0000 mg | ORAL_TABLET | Freq: Two times a day (BID) | ORAL | Status: DC
  Administered 2022-08-27 – 2022-09-04 (×16): 10 mg via ORAL
  Filled 2022-08-27 (×18): qty 1

## 2022-08-27 NOTE — Progress Notes (Signed)
[facility] MD Progress Note  08/27/2022 4:24 PM Kara Stephens  MRN:  [id number] Principal Problem: Bipolar 1 disorder, mixed (HCC) Diagnosis: Active Problems:   Asthma   Bipolar I disorder, most recent episode depressed, severe without psychotic features (HCC)   Anxiety   Insomnia   Essential hypertension   COVID-19  Reason For Admission: Kara Stephens is a 36 yo Caucasian female with a prior mental health history of bipolar d/o who presented to the [facility] urgent care [facility]) accompanied by her husband on 12/4 with complaints of worsening depressive symptoms with a plan to jump off their [address] storey apartment building & intrusive thoughts to harm her 62 month old baby. Pt was transferred voluntarily to this [facility] for treatment and stabilization of her mood.    24 hr chart review: V/S for the past 24 hrs have been WNL. Pt has been compliant with all scheduled medications. No PRN medications given overnight. She remains on contact & droplet precautions related to Covid 19 infection. Isolation precautions will end tomorrow, and pt will be able to get out of her room wearing a mask.   Today's assessment: For this encounter, pt is seen with attending psychiatrist. Objectively, mood is less depressed, and affect is congruent. Pt however, is continuing to report suicidal ideations, still with a plan to jump off the balcony of her second storey apartment. She continues to reports that she is having intrusive thoughts to throw her 61 month old baby over the balcony of her second floor apartment. Pt reports a good appetite, reports a good sleep quality, and seems well rested. She denies any current medication side effects.  Attending Psychiatrist educated on medication changes, as well as the rationales, benefits and possible side effects of all medications. Pt was educated on the following changes.-We will stop Prozac, Stop Zyprexa. We will start Anafranil at 25 mg daily as this  will be helpful for the intrusive and obsessive thoughts that she is having. We will restart Seroquel, but at 100 mg nightly for sleep and mood. We will also increase Topamax to 50 mg BID, and increase Namenda to 10 mg BID. Pt verbalized understanding, and is agreeable to all medication changes listed below.   Total Time spent with patient: 45 minutes  Past Psychiatric History: Bipolar d/o  Past Medical History:  Past Medical History:  Diagnosis Date   Anxiety    Back spasm    Bipolar I disorder (HCC)    Hypertension    Tachycardia     Past Surgical History:  Procedure Laterality Date   APPENDECTOMY     CHOLECYSTECTOMY     HAND SURGERY     SHOULDER SURGERY     Family History:  Family History  Problem Relation Age of Onset   Hypertension Mother    Hyperlipidemia Mother    Suicidality Father    Depression Father    Family Psychiatric  History: See above Social History:  Social History   Substance and Sexual Activity  Alcohol Use No     Social History   Substance and Sexual Activity  Drug Use No    Social History   Socioeconomic History   Marital status: Married    Spouse name: Not on file   Number of children: Not on file   Years of education: Not on file   Highest education level: Not on file  Occupational History   Not on file  Tobacco Use   Smoking status: Former  Types: Cigarettes    Quit date: 09/10/2016    Years since quitting: 5.9   Smokeless tobacco: Never  Vaping Use   Vaping Use: Never used  Substance and Sexual Activity   Alcohol use: No   Drug use: No   Sexual activity: Not on file  Other Topics Concern   Not on file  Social History Narrative   Not on file   Social Determinants of Health   Financial Resource Strain: Not on file  Food Insecurity: No Food Insecurity (08/11/2022)   Hunger Vital Sign    Worried About Running Out of Food in the Last Year: Never true    Ran Out of Food in the Last Year: Never true  Transportation Needs:  No Transportation Needs (08/11/2022)   PRAPARE - Administrator, Civil Service (Medical): No    Lack of Transportation (Non-Medical): No  Physical Activity: Not on file  Stress: Not on file  Social Connections: Not on file   Additional Social History:   Sleep: Poor  Appetite:  Fair  Current Medications: Current Facility-Administered Medications  Medication Dose Route Frequency Provider Last Rate Last Admin   acetaminophen (TYLENOL) tablet 650 mg  650 mg Oral Q6H PRN [name], MD   650 mg at 08/21/22 0935   albuterol (VENTOLIN HFA) 108 (90 Base) MCG/ACT inhaler 2 puff  2 puff Inhalation Q4H PRN [name], MD       alum & mag hydroxide-simeth (MAALOX/MYLANTA) 200-200-20 MG/5ML suspension 30 mL  30 mL Oral Q4H PRN [name], MD       clomiPRAMINE (ANAFRANIL) capsule 25 mg  25 mg Oral QHS Massengill, Nathan, MD       docusate sodium (COLACE) capsule 100 mg  100 mg Oral BID Massengill, [name], MD   100 mg at 08/27/22 0747   hydrOXYzine (ATARAX) tablet 25 mg  25 mg Oral TID PRN [name], MD   25 mg at 08/24/22 2108   lamoTRIgine (LAMICTAL) tablet 200 mg  200 mg Oral Daily Massengill, [name], MD   200 mg at 08/27/22 0748   LORazepam (ATIVAN) tablet 1 mg  1 mg Oral Q8H Massengill, Nathan, MD   1 mg at 08/27/22 1413   magnesium hydroxide (MILK OF MAGNESIA) suspension 30 mL  30 mL Oral Daily PRN [name], MD       melatonin tablet 5 mg  5 mg Oral Q12H Massengill, Nathan, MD   5 mg at 08/27/22 0752   memantine (NAMENDA) tablet 10 mg  10 mg Oral Q12H Massengill, [name], MD       metFORMIN (GLUCOPHAGE-XR) 24 hr tablet 500 mg  500 mg Oral Q breakfast Massengill, Nathan, MD   500 mg at 08/27/22 0749   polyethylene glycol (MIRALAX / GLYCOLAX) packet 17 g  17 g Oral Daily Massengill, [name], MD   17 g at 08/27/22 0749   propranolol ER (INDERAL LA) 24 hr capsule 60 mg  60 mg Oral Daily Massengill, [name], MD   60 mg at 08/27/22 0830   QUEtiapine (SEROQUEL XR) 24 hr tablet 100 mg  100 mg  Oral QHS Massengill, Nathan, MD       topiramate (TOPAMAX) tablet 50 mg  50 mg Oral Q12H Massengill, Nathan, MD       traZODone (DESYREL) tablet 50 mg  50 mg Oral QHS PRN [name], MD   50 mg at 08/20/22 2055   Lab Results:  No results found for this or any previous visit (from the  past 48 hour(s)).   Blood Alcohol level:  Lab Results  Component Value Date   ETH <10 08/10/2022   Metabolic Disorder Labs: Lab Results  Component Value Date   HGBA1C 5.8 (H) 08/10/2022   MPG 120 08/10/2022   No results found for: "PROLACTIN" Lab Results  Component Value Date   CHOL 202 (H) 08/12/2022   TRIG 154 (H) 08/12/2022   HDL 50 08/12/2022   CHOLHDL 4.0 08/12/2022   VLDL 31 08/12/2022   LDLCALC 121 (H) 08/12/2022   Physical Findings: AIMS: 0 CIWA: n/a COWS:n/a  Musculoskeletal: Strength & Muscle Tone: within normal limits Gait & Station: normal Patient leans: N/A  Psychiatric Specialty Exam:  Presentation  General Appearance:  Appropriate for Environment; Casual  Eye Contact: Good  Speech: Clear and Coherent  Speech Volume: Normal  Handedness: Right  Mood and Affect  Mood: Depressed  Affect: Congruent  Thought Process  Thought Processes: Coherent  Descriptions of Associations:Intact  Orientation:Full (Time, Place and Person)  Thought Content:Logical  History of Schizophrenia/Schizoaffective disorder:No  Duration of Psychotic Symptoms:No data recorded Hallucinations:Hallucinations: None   Ideas of Reference:None  Suicidal Thoughts:Suicidal Thoughts: Yes, Active SI Active Intent and/or Plan: With Plan; Without Intent  Homicidal Thoughts:Homicidal Thoughts: Yes, Active HI Active Intent and/or Plan: With Plan; Without Intent   Sensorium  Memory: Immediate Good  Judgment: Fair  Insight: Fair  Executive Functions  Concentration: Good  Attention Span: Good  Recall: Jennelle Human of  Knowledge: Fair  Language: Fair  Psychomotor Activity  Psychomotor Activity: Psychomotor Activity: Normal   Assets  Assets: Communication Skills; Social Support; Housing  Sleep  Sleep: Sleep: Good  Physical Exam: Physical Exam Vitals and nursing note reviewed.  Constitutional:      Appearance: Normal appearance.  HENT:     Head: Normocephalic.     Nose: Nose normal. No congestion or rhinorrhea.  Eyes:     Pupils: Pupils are equal, round, and reactive to light.  Cardiovascular:     Rate and Rhythm: Normal rate.     Pulses: Normal pulses.  Pulmonary:     Effort: Pulmonary effort is normal.  Genitourinary:    Comments: Deferred Musculoskeletal:        General: Normal range of motion.     Cervical back: Normal range of motion.  Skin:    General: Skin is warm.  Neurological:     Mental Status: She is alert and oriented to person, place, and time.  Psychiatric:        Mood and Affect: Mood normal.        Behavior: Behavior normal.    Review of Systems  Constitutional:  Negative for chills and fever.  HENT: Negative.  Negative for hearing loss.   Eyes: Negative.  Negative for blurred vision.  Respiratory: Negative.  Negative for cough.   Cardiovascular: Negative.  Negative for chest pain.  Gastrointestinal: Negative.   Genitourinary: Negative.   Musculoskeletal: Negative.   Skin: Negative.  Negative for rash.  Neurological: Negative.  Negative for dizziness and headaches.  Psychiatric/Behavioral:  Positive for depression and suicidal ideas. Negative for hallucinations, memory loss and substance abuse. The patient is nervous/anxious. The patient does not have insomnia.    Blood pressure 119/76, pulse 76, temperature 98.8 F (37.1 C), temperature source Oral, resp. rate 18, height 5\' 5"  (1.651 m), weight 91.6 kg, SpO2 100 %. Body mass index is 33.61 kg/m.  Treatment Plan Summary: Daily contact with patient to assess and evaluate symptoms and progress in  treatment and Medication management   Observation Level/Precautions:  15 minute checks  Laboratory:  Labs reviewed   Psychotherapy:  Unit Group sessions  [facility]  Consultations:  To be determined   Discharge Concerns:  Safety, medication compliance, mood stability  Estimated LOS: 5-7 days  Other:  N/A    Labs Reviewed independently on 08/24/2022:  CMP WNL, Lipid panel reviewed (cholesterol 202, LDL-121, Triglycerides 154-educated on healthy food choices and exercise. HA1C is 5.8 rendering pt a prediabetic-will need PCP f/u after dc. TSH WNL. UA hazy with rare bacteria otherwise WNL. EKG from 12/04 with QTC-445.    PLAN Safety and Monitoring: Voluntary admission to inpatient psychiatric unit for safety, stabilization and treatment Daily contact with patient to assess and evaluate symptoms and progress in treatment Patient's case to be discussed in multi-disciplinary team meeting Observation Level : q15 minute checks Vital signs: q12 hours Precautions: Safety   Long Term Goal(s): Improvement in symptoms so as ready for discharge   Short Term Goals: Ability to identify changes in lifestyle to reduce recurrence of condition will improve, Ability to verbalize feelings will improve, Ability to disclose and discuss suicidal ideas, Ability to demonstrate self-control will improve, Ability to identify and develop effective coping behaviors will improve, and Compliance with prescribed medications will improve   Diagnoses:  Active Problems:   Asthma   Bipolar I disorder, most recent episode depressed, severe without psychotic features (HCC)   Anxiety   Insomnia   Essential hypertension   Medications  MDD/mood stabilization -Restart Seroquel XR 100 mg nightly for sleep/mood -Start Anafranil 25 mg daily for depressive symptoms/obsessions (Educated to abstain from Tyramine containing foods due  to dangerous interactions with this class of medications). -Increase Topamax to 50  mg BID -Continue Lamictal 200 mg daily for mood stabilization (home med) -Discontinue Prozac 60 mg d/t lack of efficacy (08/27/22) -Discontinue Zyprexa 20 mg nightly due to lack of efficacy (08/27/22)  Catatonia -Continue Ativan to 1 mg TID for catatonia -Increase Namenda to 10 mg BID  Anxiety -Continue Hydroxyzine 25 mg every 6 hours PRN -Continue Albuterol PRN Q 4 H for wheezing/SOB -Continue Inderal LA 60 mg daily for anxiety/tachycardia (home med) -Discontinue Seroquel 300 mg nightly d/t lack of efficacy (08/17/22) -Discontinue Seroquel 100 mg at bedtime PRN (08/17/22) -Nifedipine completely tapered off on 12/19 (home med) -Given Ativan 2 mg IM x 0ne dose for catatonia on 12/06 with good response  Insomnia -Continue Trazodone 50 mg nightly and 50 mg PRN for insomnia  Other PRNS -Continue Tylenol 650 mg every 6 hours PRN for mild pain -Continue Maalox 30 mg every 4 hrs PRN for indigestion -Continue Milk of Magnesia as needed every 6 hrs for constipation   Discharge Planning: Social work and case management to assist with discharge planning and identification of hospital follow-up needs prior to discharge Estimated LOS: 5-7 days Discharge Concerns: Need to establish a safety plan; Medication compliance and effectiveness Discharge Goals: Return home with outpatient referrals for mental health follow-up including medication management/psychotherapy   Kara Blueoris  Siri Buege, Kara Stephens 08/27/2022, 4:24 PMPatient ID: Kara Stephens, female   DOB: 10/05/1985, 36 y.o.   MRN: [id number] Patient ID: Kara Stephens, Patient ID: Kara Stephens, female   Patient ID: Kara Stephens, female   DOB: 05/23/1986, 36 y.o.

## 2022-08-27 NOTE — Progress Notes (Signed)
Psychoeducational Group Note  Date:  08/27/2022 Time:  2046  Group Topic/Focus:  Wrap-Up Group:   The focus of this group is to help patients review their daily goal of treatment and discuss progress on daily workbooks.  Participation Level: Did Not Attend  Participation Quality:  Not Applicable  Affect:  Not Applicable  Cognitive:  Not Applicable  Insight:  Not Applicable  Engagement in Group: Not Applicable  Additional Comments:  The patient did not attend group this evening since she is being quarantined to her room.   Hazle Coca S 08/27/2022, 8:46 PM

## 2022-08-27 NOTE — Progress Notes (Signed)
Pt in room today continuing isolation due to positive covid status. Pt presents with blunted affect. Pt reports suicidal thoughts and thoughts of harming baby remain. Pt reports talking to husband on phone and states he is supportive. Pt struggling with isolation and hopes to be able to come out of room tomorrow. EKG performed. Dressing changed on sacrum. Support and encouragement offered. Q 15 minute checks ongoing.

## 2022-08-27 NOTE — Progress Notes (Signed)
   08/26/22 2200  Psych Admission Type (Psych Patients Only)  Admission Status Voluntary  Psychosocial Assessment  Patient Complaints Depression;Anxiety  Eye Contact Fair  Facial Expression Flat  Affect Anxious;Depressed  Speech Logical/coherent  Interaction Assertive  Motor Activity Slow  Appearance/Hygiene Unremarkable  Behavior Characteristics Cooperative;Appropriate to situation  Mood Depressed;Anxious  Thought Process  Coherency WDL  Content WDL  Delusions None reported or observed  Perception WDL  Hallucination None reported or observed  Judgment Impaired  Confusion None  Danger to Self  Current suicidal ideation? Passive  Description of Suicide Plan Reports feelings of SI/HI. (Thoughts of jumping off balcony or harming child. Patient states she does not have plan/intent while at [facility].)  Self-Injurious Behavior Self-injurious ideation with potentially lethal plan observed or expressed  Agreement Not to Harm Self Yes  Description of Agreement Verbally contracts for safety.  Danger to Others  Danger to Others Reported or observed  Danger to Others Abnormal  Destructive Behavior No threats or harm toward property  Description of Harmful Behavior Thoughts to harm child.   Patient alert and oriented. Presenting with a depressed, anxious affect and mood. Patient denies AVH, and pain. Patient endorses passive SI with thoughts of jumping off balcony, patient endorses feelings of HI toward her child. Patient states she does not have plan or intent while at [facility]. Patient rates anxiety 9/10 and depression 10/10 on 0-10 scale with 0 being the least and 10 being the worst. Scheduled medications administered to patient, per provider orders. Patient wound dressing completed. Support and encouragement provided. Routine safety checks conducted every 15 minutes. Patient verbally contracts for safety and remains safe on the unit.

## 2022-08-28 DIAGNOSIS — F3160 Bipolar disorder, current episode mixed, unspecified: Secondary | ICD-10-CM | POA: Diagnosis not present

## 2022-08-28 MED ORDER — MELATONIN 5 MG PO TABS
5.0000 mg | ORAL_TABLET | Freq: Every day | ORAL | Status: DC
  Administered 2022-08-29 – 2022-09-03 (×6): 5 mg via ORAL
  Filled 2022-08-28 (×8): qty 1

## 2022-08-28 NOTE — Progress Notes (Signed)
[facility] MD Progress Note  08/28/2022 4:20 PM Kara Stephens  MRN:  [id number] Principal Problem: Bipolar 1 disorder, mixed (HCC) Diagnosis: Active Problems:   Asthma   Bipolar I disorder, most recent episode depressed, severe without psychotic features (HCC)   Anxiety   Insomnia   Essential hypertension   COVID-19  Reason For Admission: Kara Stephens is a 36 yo Caucasian female with a prior mental health history of bipolar d/o who presented to the [employer] [facility] health urgent care [facility]) accompanied by her husband on 12/4 with complaints of worsening depressive symptoms with a plan to jump off their [address]nd storey apartment building & intrusive thoughts to harm her 63 month old baby. Pt was transferred voluntarily to this [facility] for treatment and stabilization of her mood.    24 hr chart review: V/S for the past 24 hrs have been WNL. Pt has been compliant with all scheduled medications. PRN Hydroxyzine 25 mg given today afternoon for anxiety.  Contact and airborne isolation related to Covid 19 infection ended today morning. Pt has been out of her room wearing a mask.   Today's assessment: Objectively, mood is less depressed. Pt smiles occasionally during assessment. She continues to endorse suicidal ideations, reports plan to jump off the balcony of her 2nd floor apartment and reports intrusive thoughts to throw her baby over the balcony of her second floor apartment as well. She denies AVH, denies paranoia and denies delusional thinking. Her attention to personal hygiene and grooming is fair, eye contact is good, speech is clear & coherent. Thought contents are organized and logical.   Pt reports that she slept last night, reports sleep as fair, but states that she is unsure of how many hours she slept. She reports a good appetite, denies being in any physical pain. She denies any medication related side effects, states that nursing staff is completing dressing changes to the wound on  her sacrum. She reports that she has spoken to her husband who states that he will come visit tonight. We completed medication changes yesterday, we are continuing those medications today as listed below with no changes today.  Total Time spent with patient: 45 minutes  Past Psychiatric History: Bipolar d/o  Past Medical History:  Past Medical History:  Diagnosis Date   Anxiety    Back spasm    Bipolar I disorder (HCC)    Hypertension    Tachycardia     Past Surgical History:  Procedure Laterality Date   APPENDECTOMY     CHOLECYSTECTOMY     HAND SURGERY     SHOULDER SURGERY     Family History:  Family History  Problem Relation Age of Onset   Hypertension Mother    Hyperlipidemia Mother    Suicidality Father    Depression Father    Family Psychiatric  History: See above Social History:  Social History   Substance and Sexual Activity  Alcohol Use No     Social History   Substance and Sexual Activity  Drug Use No    Social History   Socioeconomic History   Marital status: Married    Spouse name: Not on file   Number of children: Not on file   Years of education: Not on file   Highest education level: Not on file  Occupational History   Not on file  Tobacco Use   Smoking status: Former    Types: Cigarettes    Quit date: 09/10/2016    Years since quitting:  5.9   Smokeless tobacco: Never  Vaping Use   Vaping Use: Never used  Substance and Sexual Activity   Alcohol use: No   Drug use: No   Sexual activity: Not on file  Other Topics Concern   Not on file  Social History Narrative   Not on file   Social Determinants of Health   Financial Resource Strain: Not on file  Food Insecurity: No Food Insecurity (08/11/2022)   Hunger Vital Sign    Worried About Running Out of Food in the Last Year: Never true    Ran Out of Food in the Last Year: Never true  Transportation Needs: No Transportation Needs (08/11/2022)   PRAPARE - Scientist, research (physical sciences)Transportation    Lack of  Transportation (Medical): No    Lack of Transportation (Non-Medical): No  Physical Activity: Not on file  Stress: Not on file  Social Connections: Not on file   Additional Social History:   Sleep: Poor  Appetite:  Fair  Current Medications: Current Facility-Administered Medications  Medication Dose Route Frequency Provider Last Rate Last Admin   acetaminophen (TYLENOL) tablet 650 mg  650 mg Oral Q6H PRN Park PopeJi, Andrew, MD   650 mg at 08/21/22 0935   albuterol (VENTOLIN HFA) 108 (90 Base) MCG/ACT inhaler 2 puff  2 puff Inhalation Q4H PRN Park PopeJi, Andrew, MD       alum & mag hydroxide-simeth (MAALOX/MYLANTA) 200-200-20 MG/5ML suspension 30 mL  30 mL Oral Q4H PRN Park PopeJi, Andrew, MD       clomiPRAMINE (ANAFRANIL) capsule 25 mg  25 mg Oral QHS Massengill, Harrold DonathNathan, MD   25 mg at 08/27/22 2143   docusate sodium (COLACE) capsule 100 mg  100 mg Oral BID Massengill, Harrold DonathNathan, MD   100 mg at 08/28/22 0754   hydrOXYzine (ATARAX) tablet 25 mg  25 mg Oral TID PRN Park PopeJi, Andrew, MD   25 mg at 08/28/22 0758   lamoTRIgine (LAMICTAL) tablet 200 mg  200 mg Oral Daily Massengill, Harrold DonathNathan, MD   200 mg at 08/28/22 0755   LORazepam (ATIVAN) tablet 1 mg  1 mg Oral Q8H Massengill, Nathan, MD   1 mg at 08/28/22 1319   magnesium hydroxide (MILK OF MAGNESIA) suspension 30 mL  30 mL Oral Daily PRN Park PopeJi, Andrew, MD       Melene Muller[START ON 08/29/2022] melatonin tablet 5 mg  5 mg Oral QHS Massengill, Nathan, MD       memantine [facility](NAMENDA) tablet 10 mg  10 mg Oral Q12H Massengill, Harrold DonathNathan, MD   10 mg at 08/28/22 0756   metFORMIN (GLUCOPHAGE-XR) 24 hr tablet 500 mg  500 mg Oral Q breakfast Massengill, Harrold DonathNathan, MD   500 mg at 08/28/22 0757   polyethylene glycol (MIRALAX / GLYCOLAX) packet 17 g  17 g Oral Daily Massengill, Harrold DonathNathan, MD   17 g at 08/28/22 0757   propranolol ER (INDERAL LA) 24 hr capsule 60 mg  60 mg Oral Daily Massengill, Harrold DonathNathan, MD   60 mg at 08/28/22 0757   QUEtiapine (SEROQUEL XR) 24 hr tablet 100 mg  100 mg Oral QHS Massengill, Harrold DonathNathan, MD    100 mg at 08/27/22 2144   topiramate (TOPAMAX) tablet 50 mg  50 mg Oral Q12H Massengill, Harrold DonathNathan, MD   50 mg at 08/28/22 0757   traZODone (DESYREL) tablet 50 mg  50 mg Oral QHS PRN Phineas InchesMassengill, Nathan, MD   50 mg at 08/20/22 2055   Lab Results:  No results found for this or any previous visit (from the past 48  hour(s)).   Blood Alcohol level:  Lab Results  Component Value Date   ETH <10 08/10/2022   Metabolic Disorder Labs: Lab Results  Component Value Date   HGBA1C 5.8 (H) 08/10/2022   MPG 120 08/10/2022   No results found for: "PROLACTIN" Lab Results  Component Value Date   CHOL 202 (H) 08/12/2022   TRIG 154 (H) 08/12/2022   HDL 50 08/12/2022   CHOLHDL 4.0 08/12/2022   VLDL 31 08/12/2022   LDLCALC 121 (H) 08/12/2022   Physical Findings: AIMS: 0 CIWA: n/a COWS:n/a  Musculoskeletal: Strength & Muscle Tone: within normal limits Gait & Station: normal Patient leans: N/A  Psychiatric Specialty Exam:  Presentation  General Appearance:  Appropriate for Environment; Fairly Groomed  Eye Contact: Good  Speech: Clear and Coherent  Speech Volume: Normal  Handedness: Right  Mood and Affect  Mood: Depressed  Affect: Congruent  Thought Process  Thought Processes: Coherent  Descriptions of Associations:Intact  Orientation:Full (Time, Place and Person)  Thought Content:Obsessions  History of Schizophrenia/Schizoaffective disorder:No  Duration of Psychotic Symptoms:No data recorded Hallucinations:Hallucinations: None   Ideas of Reference:None  Suicidal Thoughts:Suicidal Thoughts: Yes, Active SI Active Intent and/or Plan: Without Intent  Homicidal Thoughts:Homicidal Thoughts: Yes, Active HI Active Intent and/or Plan: Without Intent   Sensorium  Memory: Immediate Good  Judgment: Fair  Insight: Fair  Chartered certified accountant: Fair  Attention Span: Fair  Recall: [employer] of  Knowledge: Fair  Language: Fair  Psychomotor Activity  Psychomotor Activity: Psychomotor Activity: Normal   Assets  Assets: Housing; Social Support  Sleep  Sleep: Sleep: Fair  Physical Exam: Physical Exam Vitals and nursing note reviewed.  Constitutional:      Appearance: Normal appearance.  HENT:     Head: Normocephalic.     Nose: Nose normal. No congestion or rhinorrhea.  Eyes:     Pupils: Pupils are equal, round, and reactive to light.  Cardiovascular:     Rate and Rhythm: Normal rate.     Pulses: Normal pulses.  Pulmonary:     Effort: Pulmonary effort is normal.  Genitourinary:    Comments: Deferred Musculoskeletal:        General: Normal range of motion.     Cervical back: Normal range of motion.  Skin:    General: Skin is warm.  Neurological:     Mental Status: She is alert and oriented to person, place, and time.  Psychiatric:        Mood and Affect: Mood normal.        Behavior: Behavior normal.    Review of Systems  Constitutional:  Negative for chills and fever.  HENT: Negative.  Negative for hearing loss.   Eyes: Negative.  Negative for blurred vision.  Respiratory: Negative.  Negative for cough.   Cardiovascular: Negative.  Negative for chest pain.  Gastrointestinal: Negative.   Genitourinary: Negative.   Musculoskeletal: Negative.   Skin: Negative.  Negative for rash.  Neurological: Negative.  Negative for dizziness and headaches.  Psychiatric/Behavioral:  Positive for depression and suicidal ideas. Negative for hallucinations, memory loss and substance abuse. The patient is nervous/anxious. The patient does not have insomnia.    Blood pressure 105/72, pulse 74, temperature 98 F (36.7 C), temperature source Oral, resp. rate 18, height 5\' 5"  (1.651 m), weight 91.6 kg, SpO2 99 %. Body mass index is 33.61 kg/m.  Treatment Plan Summary: Daily contact with patient to assess and evaluate symptoms and progress in treatment and Medication  management  Observation Level/Precautions:  15 minute checks  Laboratory:  Labs reviewed   Psychotherapy:  Unit Group sessions  Medications:  See [facility]  Consultations:  To be determined   Discharge Concerns:  Safety, medication compliance, mood stability  Estimated LOS: 5-7 days  Other:  N/A    Labs Reviewed independently on 08/28/2022:  CMP WNL, Lipid panel reviewed (cholesterol 202, LDL-121, Triglycerides 154-educated on healthy food choices and exercise. HA1C is 5.8 rendering pt a prediabetic-will need PCP f/u after dc. TSH WNL. UA hazy with rare bacteria otherwise WNL. EKG from 12/04 with QTC-445. Repeat EKG from 12/21 with Qtc of 446.   PLAN Safety and Monitoring: Voluntary admission to inpatient psychiatric unit for safety, stabilization and treatment Daily contact with patient to assess and evaluate symptoms and progress in treatment Patient's case to be discussed in multi-disciplinary team meeting Observation Level : q15 minute checks Vital signs: q12 hours Precautions: Safety   Long Term Goal(s): Improvement in symptoms so as ready for discharge   Short Term Goals: Ability to identify changes in lifestyle to reduce recurrence of condition will improve, Ability to verbalize feelings will improve, Ability to disclose and discuss suicidal ideas, Ability to demonstrate self-control will improve, Ability to identify and develop effective coping behaviors will improve, and Compliance with prescribed medications will improve   Diagnoses:  Active Problems:   Asthma   Bipolar I disorder, most recent episode depressed, severe without psychotic features (HCC)   Anxiety   Insomnia   Essential hypertension   Medications  MDD/mood stabilization -Continue Seroquel XR 100 mg nightly for sleep/mood -Continue  Anafranil 25 mg daily for depressive symptoms/OCD(Educated to abstain from Tyramine containing foods due  to dangerous interactions with this class of medications). -Continue  Topamax to 50 mg BID for OCD -Continue Lamictal 200 mg daily for mood stabilization (home med) -Discontinued Prozac 60 mg d/t lack of efficacy (08/27/22) -Discontinued Zyprexa 20 mg nightly due to lack of efficacy (08/27/22)  Catatonia & OCD -Continue Ativan to 1 mg TID for catatonia -Continue Namenda to 10 mg BID for OCD  Anxiety -Continue Hydroxyzine 25 mg every 6 hours PRN -Continue Albuterol PRN Q 4 H for wheezing/SOB -Continue Inderal LA 60 mg daily for anxiety/tachycardia (home med) -Discontinue Seroquel 300 mg nightly d/t lack of efficacy (08/17/22) -Discontinue Seroquel 100 mg at bedtime PRN (08/17/22) -Nifedipine completely tapered off on 12/19 (home med) -Given Ativan 2 mg IM x 0ne dose for catatonia on 12/06 with good response  Insomnia -Continue Trazodone 50 mg nightly and 50 mg PRN for insomnia  Other PRNS -Continue Tylenol 650 mg every 6 hours PRN for mild pain -Continue Maalox 30 mg every 4 hrs PRN for indigestion -Continue Milk of Magnesia as needed every 6 hrs for constipation   Discharge Planning: Social work and case management to assist with discharge planning and identification of hospital follow-up needs prior to discharge Estimated LOS: 5-7 days Discharge Concerns: Need to establish a safety plan; Medication compliance and effectiveness Discharge Goals: Return home with outpatient referrals for mental health follow-up including medication management/psychotherapy   Starleen Blue, NP 08/28/2022, 4:20 PMPatient ID: Kara Stephens, female   DOB: 03-12-1986, 73 y.o.   MRN: [id number] Patient ID: Kara Stephens, Patient ID: Kara Stephens, female   Patient ID: Kara Stephens, female   DOB: Jan 13, 1986, 27 y.o. Patient ID: Kara Stephens, female   DOB: [birth year], 1 y.o.   MRN: [id number]

## 2022-08-28 NOTE — Plan of Care (Signed)
  Problem: Education: Goal: Knowledge of [facility] Education information/materials will improve Outcome: Progressing Goal: Emotional status will improve Outcome: Progressing Goal: Mental status will improve Outcome: Progressing Goal: Verbalization of understanding the information provided will improve Outcome: Progressing   Problem: Activity: Goal: Interest or engagement in activities will improve Outcome: Progressing Goal: Sleeping patterns will improve Outcome: Progressing   Problem: Coping: Goal: Ability to verbalize frustrations and anger appropriately will improve Outcome: Progressing Goal: Ability to demonstrate self-control will improve Outcome: Progressing   Problem: Health Behavior/Discharge Planning: Goal: Identification of resources available to assist in meeting health care needs will improve Outcome: Progressing Goal: Compliance with treatment plan for underlying cause of condition will improve Outcome: Progressing   Problem: Physical Regulation: Goal: Ability to maintain clinical measurements within normal limits will improve Outcome: Progressing   Problem: Safety: Goal: Periods of time without injury will increase Outcome: Progressing   

## 2022-08-28 NOTE — Progress Notes (Signed)
D: Pt alert and oriented. Pt rates depression 0/10 and anxiety 7/10.  Pt denies experiencing any SI/HI, or AVH at this time. Pt off Covid precautions and room moved too 304.  A: Scheduled medications administered to pt, per MD orders. Support and encouragement provided. Frequent verbal contact made. Routine safety checks conducted q15 minutes.   R: No adverse drug reactions noted. Pt verbally contracts for safety at this time. Pt complaint with medications and treatment plan. Pt interacts well with others on the unit. Pt remains safe at this time. Will continue to monitor.

## 2022-08-28 NOTE — [facility] Group Notes (Signed)
Adult Psychoeducational Group Note  Date:  08/28/2022 Time:  10:07 AM  Group Topic/Focus:  Goals Group:   The focus of this group is to help patients establish daily goals to achieve during treatment and discuss how the patient can incorporate goal setting into their daily lives to aide in recovery.  Participation Level:  Active  Participation Quality:  Appropriate  Affect:  Appropriate  Cognitive:  Appropriate  Insight: Appropriate  Engagement in Group:  Engaged  Modes of Intervention:  Education  Additional Comments:  PT did attend and participate group. PT stated that she was ready to learn goals to help her heal.  [name] 08/28/2022, 10:07 AM

## 2022-08-28 NOTE — Group Note (Signed)
Date:  08/28/2022 Time:  12:09 PM  Group Topic/Focus:  Rediscovering Joy:   The focus of this group is to explore various ways to relieve stress in a positive manner.    Participation Level:  Active  Participation Quality:  Appropriate  Affect:  Appropriate  Cognitive:  Appropriate  Insight: Appropriate  Engagement in Group:  Engaged  Modes of Intervention:  Role-play  Additional Comments:     Reymundo Poll 08/28/2022, 12:09 PM

## 2022-08-28 NOTE — Progress Notes (Signed)
Patient pleasant upon interaction this morning. Patient reports fair sleep. Appetite has been fair. She reports low energy level and poor concentration. Rates her depression, hopelessness and anxiety 10/10. Still endorses passive SI about wanting to jump off a balcony and throw her baby off a balcony. Contracts for safety on the unit. Reports feeling irritable and agitated. Denies any physical pain. Encouraged pt to participate in group and in the milleu. Verbal encouragement provided.

## 2022-08-28 NOTE — [facility] Group Notes (Signed)
RN Group Note  Group Date: 08/28/2022 Start Time: 1:30pm End Time: 1:55pm   Type of Therapy and Topic:  Group Note: Positive Affirmations  Participation Level:  Active   Description of Group:   This group addressed positive affirmation towards self and others.  Patients went around the room and identified two positive things about themselves and two positive things about a peer in the room.  Patients reflected on how it felt to share something positive with others, to identify positive things about themselves, and to hear positive things from others/ Patients were encouraged to have a daily reflection of positive characteristics or circumstances.   Therapeutic Goals: Patients will verbalize two of their positive qualities Patients will demonstrate empathy for others by stating two positive qualities about a peer in the group Patients will verbalize their feelings when voicing positive self affirmations and when voicing positive affirmations of others Patients will discuss the potential positive impact on their wellness/recovery of focusing on positive traits of self and others.  Summary of Patient Progress:  Patient actively engaged in the discussion and patient was able to identify positive affirmations about self as well as other group members. Patient demonstrated insight into the subject matter, was respectful of peers, participated throughout the entire session.      Cheyne Boulden R , RN 08/28/2022  1:57PM 

## 2022-08-28 NOTE — Progress Notes (Signed)
Kara Stephens approached chaplain and engaged in conversation.  Her energy was lighter than it has been.  She is grateful to be off of Covid precautions.  She is still having the intrusive thoughts and is distressed by them. She wants to be safe even if it means not being home for Christmas.  She requested prayer, which chaplain provided.  She is looking forward to seeing her husband tonight.  [address] [facility] Pager, [phone]

## 2022-08-29 DIAGNOSIS — F3160 Bipolar disorder, current episode mixed, unspecified: Secondary | ICD-10-CM | POA: Diagnosis not present

## 2022-08-29 MED ORDER — CLOMIPRAMINE HCL 25 MG PO CAPS
50.0000 mg | ORAL_CAPSULE | Freq: Every day | ORAL | Status: DC
  Administered 2022-08-30 – 2022-09-03 (×5): 50 mg via ORAL
  Filled 2022-08-29 (×6): qty 2

## 2022-08-29 MED ORDER — CLOMIPRAMINE HCL 25 MG PO CAPS
25.0000 mg | ORAL_CAPSULE | Freq: Two times a day (BID) | ORAL | Status: AC
  Administered 2022-08-29: 25 mg via ORAL
  Filled 2022-08-29 (×2): qty 1

## 2022-08-29 MED ORDER — CLOMIPRAMINE HCL 25 MG PO CAPS
25.0000 mg | ORAL_CAPSULE | Freq: Every day | ORAL | Status: DC
  Administered 2022-08-30 – 2022-09-04 (×6): 25 mg via ORAL
  Filled 2022-08-29 (×7): qty 1

## 2022-08-29 NOTE — [facility] Group Notes (Signed)
Adult Psychoeducational Group Note Date:  08/29/2022 Time:  0900-1000 Group Topic/Focus: PROGRESSIVE RELAXATION. A group where deep breathing is taught and tensing and relaxation muscle groups is used. Imagery is used as well.  Pts are asked to imagine 3 pillars that hold them up when they are not able to hold themselves up and to share that with the group.   Participation Level:  :  Rates her energy at a 2/10. Pt left the group and never returned   : Dione Housekeeper

## 2022-08-29 NOTE — Progress Notes (Signed)
   08/28/22 2109  Psych Admission Type (Psych Patients Only)  Admission Status Voluntary  Psychosocial Assessment  Patient Complaints Anxiety;Depression  Eye Contact Fair  Facial Expression Flat  Affect Anxious;Depressed  Speech Logical/coherent  Interaction Assertive  Motor Activity Slow  Appearance/Hygiene Unremarkable  Behavior Characteristics Cooperative  Mood Depressed;Anxious  Thought Process  Coherency WDL  Content WDL  Delusions None reported or observed  Perception WDL  Hallucination None reported or observed  Judgment Limited  Confusion None  Danger to Self  Current suicidal ideation? Passive  Description of Suicide Plan Endorses SI/HI  Self-Injurious Behavior Some self-injurious ideation observed or expressed.  No lethal plan expressed   Agreement Not to Harm Self Yes  Description of Agreement  (Verbal)  Danger to Others  Danger to Others None reported or observed  Danger to Others Abnormal  Harmful Behavior to others Threats of violence towards other people observed or expressed   Destructive Behavior No threats or harm toward property  Description of Harmful Behavior Thoughts to throw baby off balcony

## 2022-08-29 NOTE — Progress Notes (Signed)
D- Patient alert and oriented. Affect/mood. Denies SI, HI, AVH, and pain. Patient stated that she was not experiencing the intrusive thoughts about harming herself or her baby. Patient was smiling during the interaction and stated that it had been a "good day." Patient rated anxiety and depression as a 6/10 for each.  A- Scheduled medications administered to patient along with PRN trazodone and hydroxyzine, per MAR. Support and encouragement provided.  Routine safety checks conducted every 15 minutes.  Patient informed to notify staff with problems or concerns.  R- No adverse drug reactions noted. Patient contracts for safety at this time. Patient compliant with medications and treatment plan. Patient receptive, calm, and cooperative. Patient interacts well with others on the unit.  Patient remains safe at this time.

## 2022-08-29 NOTE — Progress Notes (Signed)
Adult Psychoeducational Group Note  Date:  08/29/2022 Time:  10:11 PM  Group Topic/Focus:  Wrap-Up Group:   The focus of this group is to help patients review their daily goal of treatment and discuss progress on daily workbooks.  Participation Level:  Active  Participation Quality:  Appropriate  Affect:  Appropriate  Cognitive:  Appropriate  Insight: Appropriate  Engagement in Group:  Developing/Improving  Modes of Intervention:  Discussion  Additional Comments:  Pt stated her goal for today was to focus on her treatment plan. Pt stated she accomplished her goal today. Pt stated she did talked with her treatment team today about her care today. Pt rated her overall day a 5 out of 10. Pt stated she was able to contact her husband today which improved her overall day. Pt stated she felt better about herself tonight. Pt stated she was able to attend all meals and group held today. Pt stated she took all medications provided today. Pt stated her appetite was improving today. Pt rated her sleep last night was poor. Pt stated the goal tonight was to get some rest. Pt stated she had no physical pain tonight. Pt deny visual hallucinations and auditory issues tonight. Pt denies thoughts of harming herself or others. Pt stated she would alert staff if anything changed.  Kara Stephens 08/29/2022, 10:11 PM

## 2022-08-29 NOTE — Progress Notes (Signed)
Patient reports she feels better today than she has the whole time she has been here. She has been a bit more talkative and has been smiling on the unit. Denies current SI/HI/AVH. Patient is still rating her depression and hopelessness a 10/10 and anxiety 10/10 but her affect is improved. Contracts for safety on unit.

## 2022-08-29 NOTE — Progress Notes (Signed)
Kara [facility] MD Progress Note  08/29/2022 11:08 AM Kara Stephens  MRN:  [id number]  Principal Problem: Bipolar 1 disorder, mixed (HCC)  Diagnosis: Active Problems:   Asthma   Bipolar I disorder, most recent episode depressed, severe without psychotic features (HCC)   Anxiety   Insomnia   Essential hypertension   COVID-19  Subjective information: Kara Stephens states, " I feel okay, my husband visited yesterday and it was a good visit."  Reason For Admission: Kara Stephens is a 36 yo Caucasian female with a prior mental health history of bipolar d/o who presented to the [facility] health urgent care [facility]) accompanied by her husband on 08/10/22 with complaints of worsening depressive symptoms with a plan to jump off their [address] storey apartment building & intrusive thoughts to harm her 62 month old baby. Pt was transferred voluntarily to this [facility] for treatment and stabilization of her mood.    24 hr chart review: V/S for the past 24 hrs have been WNL. Pt has been compliant with all scheduled medications. PRN Hydroxyzine 25 mg given last night for anxiety and trazodone was administered for sleep.  Contact and airborne isolation related to Covid 19 infection ended yesterday. Pt has been out of her room wearing a mask.   Today's assessment: As per patient's presentation, mood is less depressed. She smiles occasionally during assessment. She denies suicidal ideations.  She continues to reports intrusive thoughts to throw her baby over the balcony of her second floor apartment.  This I believe, is due to patient obsession and fixation on her intrusive thoughts.  She denies AVH, denies paranoia and denies delusional thinking. Her attention to personal hygiene and grooming is fair, eye contact is good, speech is clear & coherent. Thought contents are organized and logical.   Pt reports that she slept good last night, however, she is unsure of how many hours she slept. She reports a  good appetite, denies being in any physical pain. She denies any medication related side effects, states that nursing staff is completing dressing changes to the wound on her sacrum.  Reported having good visitation with her husband last night. We completed medication changes yesterday, we are continuing those medications today as listed below with no changes today.  Vital signs and lab reviewed, with no critical values.  Total Time spent with patient: 45 minutes  Past Psychiatric History: Bipolar d/o  Past Medical History:  Past Medical History:  Diagnosis Date   Anxiety    Back spasm    Bipolar I disorder (HCC)    Hypertension    Tachycardia     Past Surgical History:  Procedure Laterality Date   APPENDECTOMY     CHOLECYSTECTOMY     HAND SURGERY     SHOULDER SURGERY     Family History:  Family History  Problem Relation Age of Onset   Hypertension Mother    Hyperlipidemia Mother    Suicidality Father    Depression Father    Family Psychiatric  History: See above Social History:  Social History   Substance and Sexual Activity  Alcohol Use No     Social History   Substance and Sexual Activity  Drug Use No    Social History   Socioeconomic History   Marital status: Married    Spouse name: Not on file   Number of children: Not on file   Years of education: Not on file   Highest education level: Not on file  Occupational  History   Not on file  Tobacco Use   Smoking status: Former    Types: Cigarettes    Quit date: 09/10/2016    Years since quitting: 5.9   Smokeless tobacco: Never  Vaping Use   Vaping Use: Never used  Substance and Sexual Activity   Alcohol use: No   Drug use: No   Sexual activity: Not on file  Other Topics Concern   Not on file  Social History Narrative   Not on file   Social Determinants of Health   Financial Resource Strain: Not on file  Food Insecurity: No Food Insecurity (08/11/2022)   Hunger Vital Sign    Worried About Running  Out of Food in the Last Year: Never true    Ran Out of Food in the Last Year: Never true  Transportation Needs: No Transportation Needs (08/11/2022)   PRAPARE - Administrator, Civil Service (Medical): No    Lack of Transportation (Non-Medical): No  Physical Activity: Not on file  Stress: Not on file  Social Connections: Not on file   Additional Social History:   Sleep: Good  Appetite:  Good  Current Medications: Current Facility-Administered Medications  Medication Dose Route Frequency Provider Last Rate Last Admin   acetaminophen (TYLENOL) tablet 650 mg  650 mg Oral Q6H PRN [name], MD   650 mg at 08/21/22 0935   albuterol (VENTOLIN HFA) 108 (90 Base) MCG/ACT inhaler 2 puff  2 puff Inhalation Q4H PRN [name], MD       alum & mag hydroxide-simeth (MAALOX/MYLANTA) 200-200-20 MG/5ML suspension 30 mL  30 mL Oral Q4H PRN [name], MD   30 mL at 08/29/22 0559   clomiPRAMINE (ANAFRANIL) capsule 25 mg  25 mg Oral QHS Massengill, [name], MD   25 mg at 08/28/22 2109   docusate sodium (COLACE) capsule 100 mg  100 mg Oral BID Massengill, [name], MD   100 mg at 08/29/22 4818   hydrOXYzine (ATARAX) tablet 25 mg  25 mg Oral TID PRN [name], MD   25 mg at 08/28/22 2110   lamoTRIgine (LAMICTAL) tablet 200 mg  200 mg Oral Daily Massengill, [name], MD   200 mg at 08/29/22 5631   LORazepam (ATIVAN) tablet 1 mg  1 mg Oral Q8H Massengill, Nathan, MD   1 mg at 08/29/22 0559   magnesium hydroxide (MILK OF MAGNESIA) suspension 30 mL  30 mL Oral Daily PRN [name], MD       melatonin tablet 5 mg  5 mg Oral QHS Massengill, Nathan, MD       memantine [facility]) tablet 10 mg  10 mg Oral Q12H Massengill, Nathan, MD   10 mg at 08/29/22 0908   metFORMIN (GLUCOPHAGE-XR) 24 hr tablet 500 mg  500 mg Oral Q breakfast Massengill, [name], MD   500 mg at 08/29/22 0909   polyethylene glycol (MIRALAX / GLYCOLAX) packet 17 g  17 g Oral Daily Massengill, [name], MD   17 g at 08/29/22 0909   propranolol  ER (INDERAL LA) 24 hr capsule 60 mg  60 mg Oral Daily Massengill, [name], MD   60 mg at 08/29/22 0909   QUEtiapine (SEROQUEL XR) 24 hr tablet 100 mg  100 mg Oral QHS Massengill, [name], MD   100 mg at 08/28/22 2109   topiramate (TOPAMAX) tablet 50 mg  50 mg Oral Q12H Massengill, [name], MD   50 mg at 08/29/22 0909   traZODone (DESYREL) tablet 50 mg  50 mg  Oral QHS PRN [name], MD   50 mg at 08/28/22 2109   Lab Results:  No results found for this or any previous visit (from the past 48 hour(s)).   Blood Alcohol level:  Lab Results  Component Value Date   ETH <10 08/10/2022   Metabolic Disorder Labs: Lab Results  Component Value Date   HGBA1C 5.8 (H) 08/10/2022   MPG 120 08/10/2022   No results found for: "PROLACTIN" Lab Results  Component Value Date   CHOL 202 (H) 08/12/2022   TRIG 154 (H) 08/12/2022   HDL 50 08/12/2022   CHOLHDL 4.0 08/12/2022   VLDL 31 08/12/2022   LDLCALC 121 (H) 08/12/2022   Physical Findings: AIMS: 0 CIWA: n/a COWS:n/a  Musculoskeletal: Strength & Muscle Tone: within normal limits Gait & Station: normal Patient leans: N/A  Psychiatric Specialty Exam:  Presentation  General Appearance:  Appropriate for Environment; Casual; Fairly Groomed  Eye Contact: Good  Speech: Clear and Coherent; Normal Rate  Speech Volume: Normal  Handedness: Right  Mood and Affect  Mood: Depressed  Affect: Congruent  Thought Process  Thought Processes: Coherent  Descriptions of Associations:Intact  Orientation:Full (Time, Place and Person)  Thought Content:Obsessions  History of Schizophrenia/Schizoaffective disorder:No  Duration of Psychotic Symptoms:No data recorded Hallucinations:Hallucinations: None Description of Auditory Hallucinations: N/A Description of Visual Hallucinations: N/A  Ideas of Reference:None  Suicidal Thoughts:Suicidal Thoughts: No SI Active Intent and/or Plan: -- (N/A) SI Passive Intent and/or Plan: --  (N/A)  Homicidal Thoughts:Homicidal Thoughts: Yes, Active (Patient continues to be obsessed with dropping her baby over the balcony.) HI Active Intent and/or Plan: With Intent; With Plan; Without Means to Carry Out (No means to carry out her thought process due to being at [facility]) HI Passive Intent and/or Plan: -- (N/A)   Sensorium  Memory: Immediate Good  Judgment: Fair  Insight: Fair  Executive Functions  Concentration: Good  Attention Span: Good  Recall: Fair  Fund of Knowledge: Fair  Language: Fair  Psychomotor Activity  Psychomotor Activity: Psychomotor Activity: Normal   Assets  Assets: Housing; Social Support; Communication Skills  Sleep  Sleep: Sleep: Good Number of Hours of Sleep: 7  Physical Exam: Physical Exam Vitals and nursing note reviewed.  Constitutional:      Appearance: Normal appearance.  HENT:     Head: Normocephalic.     Nose: Nose normal. No congestion or rhinorrhea.  Eyes:     Pupils: Pupils are equal, round, and reactive to light.  Cardiovascular:     Rate and Rhythm: Normal rate.     Pulses: Normal pulses.  Pulmonary:     Effort: Pulmonary effort is normal.  Genitourinary:    Comments: Deferred Musculoskeletal:        General: Normal range of motion.     Cervical back: Normal range of motion.  Skin:    General: Skin is warm.  Neurological:     Mental Status: She is alert and oriented to person, place, and time.  Psychiatric:        Mood and Affect: Mood normal.        Behavior: Behavior normal.    Review of Systems  Constitutional:  Negative for chills and fever.  HENT: Negative.  Negative for hearing loss.   Eyes: Negative.  Negative for blurred vision.  Respiratory: Negative.  Negative for cough.   Cardiovascular: Negative.  Negative for chest pain.  Gastrointestinal: Negative.   Genitourinary: Negative.   Musculoskeletal: Negative.   Skin: Negative.  Negative for  rash.  Neurological: Negative.  Negative for  dizziness and headaches.  Psychiatric/Behavioral:  Positive for depression and suicidal ideas. Negative for hallucinations, memory loss and substance abuse. The patient is nervous/anxious. The patient does not have insomnia.    Blood pressure 111/76, pulse 88, temperature 98.8 F (37.1 C), temperature source Oral, resp. rate 16, height 5\' 5"  (1.651 m), weight 91.6 kg, SpO2 98 %. Body mass index is 33.61 kg/m.  Treatment Plan Summary: Daily contact with patient to assess and evaluate symptoms and progress in treatment and Medication management   Observation Level/Precautions:  15 minute checks  Laboratory:  Labs reviewed   Psychotherapy:  Unit Group sessions  Medications:  See [facility]  Consultations:  To be determined   Discharge Concerns:  Safety, medication compliance, mood stability  Estimated LOS: 5-7 days  Other:  N/A    Labs Reviewed independently on 08/28/2022:  CMP WNL, Lipid panel reviewed (cholesterol 202, LDL-121, Triglycerides 154-educated on healthy food choices and exercise. HA1C is 5.8 rendering pt a prediabetic-will need PCP f/u after dc. TSH WNL. UA hazy with rare bacteria otherwise WNL. EKG from 12/04 with QTC-445. Repeat EKG from 12/21 with Qtc of 446.   PLAN Safety and Monitoring: Voluntary admission to inpatient psychiatric unit for safety, stabilization and treatment Daily contact with patient to assess and evaluate symptoms and progress in treatment Patient's case to be discussed in multi-disciplinary team meeting Observation Level : q15 minute checks Vital signs: q12 hours Precautions: Safety   Long Term Goal(s): Improvement in symptoms so as ready for discharge   Short Term Goals: Ability to identify changes in lifestyle to reduce recurrence of condition will improve, Ability to verbalize feelings will improve, Ability to disclose and discuss suicidal ideas, Ability to demonstrate self-control will improve, Ability to identify and develop effective coping behaviors  will improve, and Compliance with prescribed medications will improve   Diagnoses:  Active Problems:   Asthma   Bipolar I disorder, most recent episode depressed, severe without psychotic features (HCC)   Anxiety   Insomnia   Essential hypertension   Medications  MDD/mood stabilization -Continue Seroquel XR 100 mg nightly for sleep/mood -Continue  Anafranil 25 mg daily for depressive symptoms/OCD(Educated to abstain from Tyramine containing foods due  to dangerous interactions with this class of medications). -Continue Topamax to 50 mg BID for OCD -Continue Lamictal 200 mg daily for mood stabilization (home med) -Discontinued Prozac 60 mg d/t lack of efficacy (08/27/22) -Discontinued Zyprexa 20 mg nightly due to lack of efficacy (08/27/22)  Catatonia & OCD -Continue Ativan to 1 mg TID for catatonia -Continue Namenda to 10 mg BID for OCD  Anxiety -Continue Hydroxyzine 25 mg every 6 hours PRN -Continue Albuterol PRN Q 4 H for wheezing/SOB -Continue Inderal LA 60 mg daily for anxiety/tachycardia (home med) -Discontinue Seroquel 300 mg nightly d/t lack of efficacy (08/17/22) -Discontinue Seroquel 100 mg at bedtime PRN (08/17/22) -Nifedipine completely tapered off on 12/19 (home med) -Given Ativan 2 mg IM x 0ne dose for catatonia on 12/06 with good response  Insomnia -Continue Trazodone 50 mg nightly and 50 mg PRN for insomnia  Other PRNS -Continue Tylenol 650 mg every 6 hours PRN for mild pain -Continue Maalox 30 mg every 4 hrs PRN for indigestion -Continue Milk of Magnesia as needed every 6 hrs for constipation   Discharge Planning: Social work and case management to assist with discharge planning and identification of hospital follow-up needs prior to discharge Estimated LOS: 5-7 days Discharge Concerns: Need to  establish a safety plan; Medication compliance and effectiveness Discharge Goals: Return home with outpatient referrals for mental health follow-up including  medication management/psychotherapy   Cecilie Lowersina C Aqua Denslow, FNP 08/29/2022, 11:08 AMPatient ID: Kara PallKristen B Stephens, female   DOB: 12/07/1985, 36 y.o.   MRN: [id number] Patient ID: Kara PallKristen B Stephens, Patient ID: Kara PallKristen B Stephens, female   Patient ID: Kara PallKristen B Stephens, female   DOB: 02/07/1986, 36 y.o. Patient ID: Kara PallKristen B Stephens, female   DOB: 12/31/1985, 36 y.o.   MRN: [id number] Patient ID: Kara PallKristen B Stephens, female   DOB: 09/27/1985, 89 y.o.   MRN: [id number]

## 2022-08-29 NOTE — Plan of Care (Signed)
  Problem: Education: Goal: Emotional status will improve Outcome: Progressing Goal: Mental status will improve Outcome: Progressing Goal: Verbalization of understanding the information provided will improve Outcome: Progressing   Problem: Activity: Goal: Sleeping patterns will improve Outcome: Progressing   Problem: Health Behavior/Discharge Planning: Goal: Compliance with treatment plan for underlying cause of condition will improve Outcome: Progressing

## 2022-08-29 NOTE — Group Note (Signed)
LCSW Group Therapy Note  08/29/2022      Type of Therapy and Topic:  Group Therapy: Gratitude   Description:   Group could not be held by social worker, but licensed RN did provide group.  A handout was given to each patient, with the following information:   Gratitude  "Acknowledging the good that you already have in your life is the foundation for all abundance." - Eckhart Tolle  " 'Enough' is a feast." - Buddhist Proverb  "Gratitude sweetens even the smallest moments."  "It is not joy that makes us grateful; It is gratitude that makes us joyful." - David Steindl-Rast    Put at least one response under each category of something for which you are grateful:  People:  Experiences:  Things:  Places:  Skills:  Other:  Add more responses as you get ideas from other people.   Therapeutic Modalities:   Activity  Vinicius Brockman J Grossman-Orr, LCSW   

## 2022-08-29 NOTE — Plan of Care (Signed)
  Problem: Education: Goal: Emotional status will improve Outcome: Progressing Goal: Mental status will improve Outcome: Progressing Goal: Verbalization of understanding the information provided will improve Outcome: Progressing   Problem: Activity: Goal: Sleeping patterns will improve Outcome: Progressing   Problem: Coping: Goal: Ability to verbalize frustrations and anger appropriately will improve Outcome: Progressing

## 2022-08-30 DIAGNOSIS — F3160 Bipolar disorder, current episode mixed, unspecified: Secondary | ICD-10-CM | POA: Diagnosis not present

## 2022-08-30 LAB — SARS CORONAVIRUS 2 BY RT PCR: SARS Coronavirus 2 by RT PCR: NEGATIVE

## 2022-08-30 NOTE — Group Note (Signed)
[facility] LCSW Group Therapy Note  Date/Time:  08/30/2022   Type of Therapy and Topic:  Group could not be held today.  A handout to read on the topic of developing a healthy support system was given to each patient.  They were encouraged to discuss this with their peers.  Handout:  A handout entitled "Developing Your Support System" was provided.  This handout discusses the benefits of a social support system, how to sustain current relationships, some ideas for building a social support system, and why it is important to cultivate your social support system now.  Therapeutic Modalities:   Handout  Fujie Dickison Grossman-Orr, LCSW        

## 2022-08-30 NOTE — Group Note (Signed)
Date:  08/30/2022 Time:  5:34 PM  Group Topic/Focus:  Dimensions of Wellness:   The focus of this group is to introduce the topic of wellness and discuss the role each dimension of wellness plays in total health.    Participation Level:  Active  Participation Quality:  Appropriate  Affect:  Appropriate  Cognitive:  Appropriate  Insight: Appropriate  Engagement in Group:  Engaged  Modes of Intervention:  Exploration  Additional Comments:     Reymundo Poll 08/30/2022, 5:34 PM

## 2022-08-30 NOTE — Group Note (Unsigned)
Date:  08/30/2022 Time:  2:03 PM  Group Topic/Focus:  Goals Group:   The focus of this group is to help patients establish daily goals to achieve during treatment and discuss how the patient can incorporate goal setting into their daily lives to aide in recovery. Orientation:   The focus of this group is to educate the patient on the purpose and policies of crisis stabilization and provide a format to answer questions about their admission.  The group details unit policies and expectations of patients while admitted.     Participation Level:  {[facility] PARTICIPATION LEVEL:22264}  Participation Quality:  {[facility] PARTICIPATION QUALITY:22265}  Affect:  {[facility] AFFECT:22266}  Cognitive:  {[facility] COGNITIVE:22267}  Insight: {[facility] Insight2:20797}  Engagement in Group:  {[facility] ENGAGEMENT IN GROUP:22268}  Modes of Intervention:  {[facility] MODES OF INTERVENTION:22269}  Additional Comments:  ***  Stassi Fadely Lashawn Lysle Yero 08/30/2022, 2:03 PM  

## 2022-08-30 NOTE — Group Note (Signed)
Date:  08/30/2022 Time:  2:03 PM  Group Topic/Focus:  Goals Group:   The focus of this group is to help patients establish daily goals to achieve during treatment and discuss how the patient can incorporate goal setting into their daily lives to aide in recovery.    Participation Level:  Active  Participation Quality:  Appropriate  Affect:  Appropriate  Cognitive:  Appropriate  Insight: Appropriate  Engagement in Group:  Engaged  Modes of Intervention:  Discussion  Additional Comments:  Pt wants to concentrate better and stay on medication.  Jaquita Rector 08/30/2022, 2:03 PM

## 2022-08-30 NOTE — Progress Notes (Signed)
Adult Psychoeducational Group Note  Date:  08/30/2022 Time:  9:22 PM  Group Topic/Focus:  Wrap-Up Group:   The focus of this group is to help patients review their daily goal of treatment and discuss progress on daily workbooks.  Participation Level: Active  Participation Quality:  Appropriate  Affect:  Appropriate  Cognitive:  Appropriate  Insight: Appropriate  Engagement in Group:  Engaged  Modes of Intervention:  Discussion, Rapport Building, and Support  Additional Comments:   Pt attended and participated in the Wrap Up group. Pt denied SI/HI/AVH and pain. Pt rated her day 9/10. Pt made progress toward her goal of improving her mental health. Pt shared that she is finally on the right medication and doesn't hear voices that much now. Pt identified being active, prayer and socialization as effective coping skills.  Kara Stephens 08/30/2022, 9:22 PM

## 2022-08-30 NOTE — Group Note (Unsigned)
Date:  08/30/2022 Time:  2:09 PM  Group Topic/Focus:  Goals Group:   The focus of this group is to help patients establish daily goals to achieve during treatment and discuss how the patient can incorporate goal setting into their daily lives to aide in recovery. Orientation:   The focus of this group is to educate the patient on the purpose and policies of crisis stabilization and provide a format to answer questions about their admission.  The group details unit policies and expectations of patients while admitted.     Participation Level:  {[facility] PARTICIPATION HENID:78242}  Participation Quality:  {[facility] PARTICIPATION QUALITY:22265}  Affect:  {[facility] AFFECT:22266}  Cognitive:  {[facility] COGNITIVE:22267}  Insight: {[facility] Insight2:20797}  Engagement in Group:  {[facility] ENGAGEMENT IN PNTIR:44315}  Modes of Intervention:  {[facility] MODES OF INTERVENTION:22269}  Additional Comments:  ***  Jaquita Rector 08/30/2022, 2:09 PM

## 2022-08-30 NOTE — Progress Notes (Signed)
   08/30/22 0515  15 Minute Checks  Location Bedroom  Visual Appearance Calm  Behavior Sleeping  Sleep (Behavioral Health Patients Only)  Calculate sleep? (Click Yes once per 24 hr at 0600 safety check) Yes  Documented sleep last 24 hours 8.5

## 2022-08-30 NOTE — Progress Notes (Signed)
[facility] MD Progress Note  08/30/2022 10:46 AM Kara Stephens  MRN:  [id number]  Principal Problem: Bipolar 1 disorder, mixed ([place])  Diagnosis: Active Problems:   Asthma   Bipolar I disorder, most recent episode depressed, severe without psychotic features ([place])   Anxiety   Insomnia   Essential hypertension   COVID-19  Subjective information: Kara Stephens states, " I feel the best I have ever been since admission.  I do not have the thoughts of dropping my baby over the balcony anymore."  Reason For Admission: Kara Stephens is a 36 yo Caucasian female with a prior mental health history of bipolar d/o who presented to the [employer] health urgent care [facility]) accompanied by her husband on 08/10/22 with complaints of worsening depressive symptoms with a plan to jump off their [address] storey apartment building & intrusive thoughts to harm her 49 month old baby. Pt was transferred voluntarily to this [facility] for treatment and stabilization of her mood.    24 hr chart review: V/S for the past 24 hrs have been WNL. Pt has been compliant with all scheduled medications. PRN Hydroxyzine 25 mg given last night for anxiety, Maalox for mild indigestion and trazodone was administered for sleep. Pt appears happy, and has been out of her room wearing a mask.   Today's assessment: On assessment today patient mood is less depressed. She smiles broadly  during assessment today. She denies suicidal ideations. She denies intrusive thoughts to throw her baby over the balcony of her second floor apartment.  This is great improvement in patient thought process and content since admission.  She denies AVH, denies paranoia and denies delusional thinking. Her attention to personal hygiene and grooming is good, eye contact is good, speech is clear & coherent. Thought contents are organized and logical.   Pt reports that she slept good last night, and feeling restful.  She reports a good appetite, denies  being in any physical pain. She denies any medication related side effects, states that nursing staff is completing dressing changes to the wound on her sacrum. We completed medication changes of clomipramine 25 mg p.o. in a.m. and 50 mg p.o. Q nightly yesterday.  We are continuing these medications today as listed below with no changes.  Patient tolerating medication with great outcomes in mood and thought content.  Vital signs and lab reviewed, with no critical values.  Total Time spent with patient: 45 minutes  Past Psychiatric History: Bipolar d/o  Past Medical History:  Past Medical History:  Diagnosis Date   Anxiety    Back spasm    Bipolar I disorder ([place])    Hypertension    Tachycardia     Past Surgical History:  Procedure Laterality Date   APPENDECTOMY     CHOLECYSTECTOMY     HAND SURGERY     SHOULDER SURGERY     Family History:  Family History  Problem Relation Age of Onset   Hypertension Mother    Hyperlipidemia Mother    Suicidality Father    Depression Father    Family Psychiatric  History: See above Social History:  Social History   Substance and Sexual Activity  Alcohol Use No     Social History   Substance and Sexual Activity  Drug Use No    Social History   Socioeconomic History   Marital status: Married    Spouse name: Not on file   Number of children: Not on file   Years of education:  Not on file   Highest education level: Not on file  Occupational History   Not on file  Tobacco Use   Smoking status: Former    Types: Cigarettes    Quit date: 09/10/2016    Years since quitting: 5.9   Smokeless tobacco: Never  Vaping Use   Vaping Use: Never used  Substance and Sexual Activity   Alcohol use: No   Drug use: No   Sexual activity: Not on file  Other Topics Concern   Not on file  Social History Narrative   Not on file   Social Determinants of Health   Financial Resource Strain: Not on file  Food Insecurity: No Food Insecurity  (08/11/2022)   Hunger Vital Sign    Worried About Running Out of Food in the Last Year: Never true    Ran Out of Food in the Last Year: Never true  Transportation Needs: No Transportation Needs (08/11/2022)   PRAPARE - Administrator, Civil Service (Medical): No    Lack of Transportation (Non-Medical): No  Physical Activity: Not on file  Stress: Not on file  Social Connections: Not on file   Additional Social History:   Sleep: Good  Appetite:  Good  Current Medications: Current Facility-Administered Medications  Medication Dose Route Frequency Provider Last Rate Last Admin   acetaminophen (TYLENOL) tablet 650 mg  650 mg Oral Q6H PRN [name], MD   650 mg at 08/21/22 0935   albuterol (VENTOLIN HFA) 108 (90 Base) MCG/ACT inhaler 2 puff  2 puff Inhalation Q4H PRN [name], MD       alum & mag hydroxide-simeth (MAALOX/MYLANTA) 200-200-20 MG/5ML suspension 30 mL  30 mL Oral Q4H PRN [name], MD   30 mL at 08/29/22 0559   clomiPRAMINE (ANAFRANIL) capsule 25 mg  25 mg Oral Daily Juanita Streight C, FNP   25 mg at 08/30/22 0809   clomiPRAMINE (ANAFRANIL) capsule 50 mg  50 mg Oral QHS Marquisa Salih C, FNP       docusate sodium (COLACE) capsule 100 mg  100 mg Oral BID Massengill, [name], MD   100 mg at 08/30/22 0809   hydrOXYzine (ATARAX) tablet 25 mg  25 mg Oral TID PRN [name], MD   25 mg at 08/29/22 2102   lamoTRIgine (LAMICTAL) tablet 200 mg  200 mg Oral Daily Massengill, [name], MD   200 mg at 08/30/22 0809   LORazepam (ATIVAN) tablet 1 mg  1 mg Oral Q8H Massengill, Nathan, MD   1 mg at 08/30/22 5400   magnesium hydroxide (MILK OF MAGNESIA) suspension 30 mL  30 mL Oral Daily PRN [name], MD       melatonin tablet 5 mg  5 mg Oral QHS Massengill, [name], MD   5 mg at 08/29/22 2102   memantine (NAMENDA) tablet 10 mg  10 mg Oral Q12H Massengill, [name], MD   10 mg at 08/30/22 0809   metFORMIN (GLUCOPHAGE-XR) 24 hr tablet 500 mg  500 mg Oral Q breakfast Massengill, [name], MD    500 mg at 08/30/22 0809   polyethylene glycol (MIRALAX / GLYCOLAX) packet 17 g  17 g Oral Daily Massengill, [name], MD   17 g at 08/30/22 0809   propranolol ER (INDERAL LA) 24 hr capsule 60 mg  60 mg Oral Daily Massengill, [name], MD   60 mg at 08/30/22 0809   QUEtiapine (SEROQUEL XR) 24 hr tablet 100 mg  100 mg Oral QHS [name], MD  100 mg at 08/29/22 2102   topiramate (TOPAMAX) tablet 50 mg  50 mg Oral Q12H Massengill, Ovid Curd, MD   50 mg at 08/30/22 0809   traZODone (DESYREL) tablet 50 mg  50 mg Oral QHS PRN Massengill, Ovid Curd, MD   50 mg at 08/29/22 2102   Lab Results:  Results for orders placed or performed during the hospital encounter of 08/11/22 (from the past 48 hour(s))  SARS Coronavirus 2 by RT PCR (hospital order, performed in [facility] hospital lab) *cepheid single result test* Anterior Nasal Swab     Status: None   Collection Time: 08/30/22  5:38 AM   Specimen: Anterior Nasal Swab  Result Value Ref Range   SARS Coronavirus 2 by RT PCR NEGATIVE NEGATIVE    Comment: (NOTE) SARS-CoV-2 target nucleic acids are NOT DETECTED.  The SARS-CoV-2 RNA is generally detectable in upper and lower respiratory specimens during the acute phase of infection. The lowest concentration of SARS-CoV-2 viral copies this assay can detect is 250 copies / mL. A negative result does not preclude SARS-CoV-2 infection and should not be used as the sole basis for treatment or other patient management decisions.  A negative result may occur with improper specimen collection / handling, submission of specimen other than nasopharyngeal swab, presence of viral mutation(s) within the areas targeted by this assay, and inadequate number of viral copies (<250 copies / mL). A negative result must be combined with clinical observations, patient history, and epidemiological information.  Fact Sheet for Patients:   https://www.patel.info/  Fact Sheet for Healthcare  Providers: https://hall.com/  This test is not yet approved or  cleared by the [place] FDA and has been authorized for detection and/or diagnosis of SARS-CoV-2 by FDA under an Emergency Use Authorization (EUA).  This EUA will remain in effect (meaning this test can be used) for the duration of the COVID-19 declaration under Section 564(b)(1) of the Act, 21 U.S.C. section 360bbb-3(b)(1), unless the authorization is terminated or revoked sooner.  Performed at [facility], [place] [address], [place], [place] [address]      Blood Alcohol level:  Lab Results  Component Value Date   ETH <10 [id number]   Metabolic Disorder Labs: Lab Results  Component Value Date   HGBA1C 5.8 (H) 08/10/2022   MPG 120 08/10/2022   No results found for: "PROLACTIN" Lab Results  Component Value Date   CHOL 202 (H) 08/12/2022   TRIG 154 (H) 08/12/2022   HDL 50 08/12/2022   CHOLHDL 4.0 08/12/2022   VLDL 31 08/12/2022   LDLCALC 121 (H) 08/12/2022   Physical Findings: AIMS: 0 CIWA: n/a COWS:n/a  Musculoskeletal: Strength & Muscle Tone: within normal limits Gait & Station: normal Patient leans: N/A  Psychiatric Specialty Exam:  Presentation  General Appearance:  Appropriate for Environment; Casual; Fairly Groomed  Eye Contact: Good  Speech: Clear and Coherent  Speech Volume: Normal  Handedness: Right  Mood and Affect  Mood: Euthymic  Affect: Appropriate; Congruent  Thought Process  Thought Processes: Coherent  Descriptions of Associations:Intact  Orientation:Full (Time, Place and Person)  Thought Content:Logical; WDL  History of Schizophrenia/Schizoaffective disorder:No  Duration of Psychotic Symptoms:No data recorded Hallucinations:Hallucinations: None Description of Auditory Hallucinations: Denies Description of Visual Hallucinations: Denies  Ideas of Reference:None  Suicidal Thoughts:Suicidal Thoughts: No SI  Active Intent and/or Plan: -- (Denies) SI Passive Intent and/or Plan: -- (Denies)  Homicidal Thoughts:Homicidal Thoughts: No (Patient denies) HI Active Intent and/or Plan: -- (Denies) HI Passive Intent and/or Plan: -- (  Denies)   Sensorium  Memory: Immediate Good; Recent Good  Judgment: Fair  Insight: Fair  Executive Functions  Concentration: Good  Attention Span: Good  Recall: Roel Cluck of Knowledge: Fair  Language: Good  Psychomotor Activity  Psychomotor Activity: Psychomotor Activity: Normal   Assets  Assets: Communication Skills; Housing; Physical Health; Social Support  Sleep  Sleep: Sleep: Good Number of Hours of Sleep: 8  Physical Exam: Physical Exam Vitals and nursing note reviewed.  Constitutional:      Appearance: Normal appearance.  HENT:     Head: Normocephalic.     Nose: Nose normal. No congestion or rhinorrhea.  Eyes:     Pupils: Pupils are equal, round, and reactive to light.  Cardiovascular:     Rate and Rhythm: Normal rate.     Pulses: Normal pulses.  Pulmonary:     Effort: Pulmonary effort is normal.  Genitourinary:    Comments: Deferred Musculoskeletal:        General: Normal range of motion.     Cervical back: Normal range of motion.  Skin:    General: Skin is warm.  Neurological:     Mental Status: She is alert and oriented to person, place, and time.  Psychiatric:        Mood and Affect: Mood normal.        Behavior: Behavior normal.    Review of Systems  Constitutional:  Negative for chills and fever.  HENT: Negative.  Negative for hearing loss.   Eyes: Negative.  Negative for blurred vision.  Respiratory: Negative.  Negative for cough.   Cardiovascular: Negative.  Negative for chest pain.  Gastrointestinal: Negative.   Genitourinary: Negative.   Musculoskeletal: Negative.   Skin: Negative.  Negative for rash.  Neurological: Negative.  Negative for dizziness and headaches.  Psychiatric/Behavioral:  Positive  for depression and suicidal ideas. Negative for hallucinations, memory loss and substance abuse. The patient is nervous/anxious. The patient does not have insomnia.    Blood pressure 115/75, pulse 75, temperature 98.3 F (36.8 C), temperature source Oral, resp. rate 16, height 5\' 5"  (1.651 m), weight 91.6 kg, SpO2 96 %. Body mass index is 33.61 kg/m.  Treatment Plan Summary: Daily contact with patient to assess and evaluate symptoms and progress in treatment and Medication management   Observation Level/Precautions:  15 minute checks  Laboratory:  Labs reviewed   Psychotherapy:  Unit Group sessions  Medications:  See [facility]  Consultations:  To be determined   Discharge Concerns:  Safety, medication compliance, mood stability  Estimated LOS: 5-7 days  Other:  N/A    Labs Reviewed independently on 08/28/2022:  CMP WNL, Lipid panel reviewed (cholesterol 202, LDL-121, Triglycerides 154-educated on healthy food choices and exercise. HA1C is 5.8 rendering pt a prediabetic-will need PCP f/u after dc. TSH WNL. UA hazy with rare bacteria otherwise WNL. EKG from 12/04 with QTC-445. Repeat EKG from 12/21 with Qtc of 446.   PLAN Safety and Monitoring: Voluntary admission to inpatient psychiatric unit for safety, stabilization and treatment Daily contact with patient to assess and evaluate symptoms and progress in treatment Patient's case to be discussed in multi-disciplinary team meeting Observation Level : q15 minute checks Vital signs: q12 hours Precautions: Safety   Long Term Goal(s): Improvement in symptoms so as ready for discharge   Short Term Goals: Ability to identify changes in lifestyle to reduce recurrence of condition will improve, Ability to verbalize feelings will improve, Ability to disclose and discuss suicidal ideas, Ability to demonstrate  self-control will improve, Ability to identify and develop effective coping behaviors will improve, and Compliance with prescribed medications  will improve   Diagnoses:  Active Problems:   Asthma   Bipolar I disorder, most recent episode depressed, severe without psychotic features ([facility])   Anxiety   Insomnia   Essential hypertension   Medications  MDD/mood stabilization -Continue Seroquel XR 100 mg nightly for sleep/mood -Continue  Anafranil 25 mg daily in am for depressive symptoms/OCD(Educated to abstain from Tyramine containing foods due  to dangerous interactions with this class of medications). -Initiate  Anafranil 50 mg q. nightly daily for depressive symptoms/OCD Educated to abstain from Tyramine containing foods due  to dangerous interactions with this class of medications) 08/29/22. -Continue Topamax to 50 mg BID for OCD -Continue Lamictal 200 mg daily for mood stabilization (home med) -Discontinued Prozac 60 mg d/t lack of efficacy (08/27/22) -Discontinued Zyprexa 20 mg nightly due to lack of efficacy (08/27/22)  Catatonia & OCD -Continue Ativan to 1 mg TID for catatonia -Continue Namenda to 10 mg BID for OCD  Anxiety -Continue Hydroxyzine 25 mg every 6 hours PRN -Continue Albuterol PRN Q 4 H for wheezing/SOB -Continue Inderal LA 60 mg daily for anxiety/tachycardia (home med) -Discontinue Seroquel 300 mg nightly d/t lack of efficacy (08/17/22) -Discontinue Seroquel 100 mg at bedtime PRN (08/17/22) -Nifedipine completely tapered off on 12/19 (home med) -Given Ativan 2 mg IM x 0ne dose for catatonia on 12/06 with good response  Insomnia -Continue Trazodone 50 mg nightly and 50 mg PRN for insomnia  Other PRNS -Continue Tylenol 650 mg every 6 hours PRN for mild pain -Continue Maalox 30 mg every 4 hrs PRN for indigestion -Continue Milk of Magnesia as needed every 6 hrs for constipation   Discharge Planning: Social work and case management to assist with discharge planning and identification of hospital follow-up needs prior to discharge Estimated LOS: 5-7 days Discharge Concerns: Need to establish a safety  plan; Medication compliance and effectiveness Discharge Goals: Return home with outpatient referrals for mental health follow-up including medication management/psychotherapy   Laretta Bolster, FNP 08/30/2022, 10:46 AMPatient ID: Kara Stephens, female   DOB: 08/22/1986, 38 y.o.   MRN: [id number] Patient ID: Kara Stephens, Patient ID: Kara Stephens, female   Patient ID: Kara Stephens, female   DOB: April 11, 1986, 36 y.o. Patient ID: Kara Stephens, female   DOB: 01-01-1986, 18 y.o.   MRN: [id number] Patient ID: Kara Stephens, female   DOB: 17-Jan-1986, 47 y.o.   MRN: [id number] Patient ID: Kara Stephens, female   DOB: 01/26/1986, 48 y.o.   MRN: [id number]

## 2022-08-31 ENCOUNTER — Encounter (HOSPITAL_COMMUNITY): Payer: Self-pay

## 2022-08-31 DIAGNOSIS — F3160 Bipolar disorder, current episode mixed, unspecified: Secondary | ICD-10-CM | POA: Diagnosis not present

## 2022-08-31 NOTE — Progress Notes (Signed)
[facility] MD Progress Note  08/31/2022 8:49 AM Kara Stephens  MRN:  [id number]  Principal Problem: Bipolar 1 disorder, mixed (HCC)  Diagnosis: Active Problems:   Asthma   Bipolar I disorder, most recent episode depressed, severe without psychotic features (HCC)   Anxiety   Insomnia   Essential hypertension   COVID-19  Subjective information: Kara Stephens states, " I continue to feel good, no more bizarre thoughts."  Reason For Admission: Kara Stephens is a 36 yo Caucasian female with a prior mental health history of bipolar d/o who presented to the [employer] health urgent care [facility]) accompanied by her husband on 08/10/22 with complaints of worsening depressive symptoms with a plan to jump off their [address] storey apartment building & intrusive thoughts to harm her 24 month old baby. Pt was transferred voluntarily to this [facility] for treatment and stabilization of her mood.    24 hr chart review: V/S labs for the past 24 hrs have been WNL. Pt has been compliant with all scheduled medications.  No PRN required for past 24 hours.  Pt appears happy, and has been out of her room wearing a mask.   Today's assessment: On assessment today patient mood is less depressed.  Patient is wearing smiles during assessment today. She denies suicidal ideations. She denies bizarre or intrusive thoughts.  This is great improvement in patient thought process and content x 3 days.  She denies AVH, denies paranoia and denies delusional thinking. Her attention to personal hygiene and grooming is good, and encouraged to take a shower today.  Eye contact is good, speech is clear & coherent. Thought contents are organized and logical.   Pt reports that she slept good - 6 hours last night, and feeling restful.  She reports a good appetite, denies being in any physical pain. She denies any medication related side effects, states that nursing staff is completing dressing changes to the wound on her  sacrum.  Patient continues on clomipramine 25 mg p.o. in a.m. and 50 mg p.o. Q nightly.  No medication changes at this time.  Patient tolerating medication with great outcomes in mood and thought content.  Vital signs and lab reviewed, with no critical values.  Total Time spent with patient: 45 minutes  Past Psychiatric History: Bipolar d/o  Past Medical History:  Past Medical History:  Diagnosis Date   Anxiety    Back spasm    Bipolar I disorder (HCC)    Hypertension    Tachycardia     Past Surgical History:  Procedure Laterality Date   APPENDECTOMY     CHOLECYSTECTOMY     HAND SURGERY     SHOULDER SURGERY     Family History:  Family History  Problem Relation Age of Onset   Hypertension Mother    Hyperlipidemia Mother    Suicidality Father    Depression Father    Family Psychiatric  History: See above Social History:  Social History   Substance and Sexual Activity  Alcohol Use No     Social History   Substance and Sexual Activity  Drug Use No    Social History   Socioeconomic History   Marital status: Married    Spouse name: Not on file   Number of children: Not on file   Years of education: Not on file   Highest education level: Not on file  Occupational History   Not on file  Tobacco Use   Smoking status: Former  Types: Cigarettes    Quit date: 09/10/2016    Years since quitting: 5.9   Smokeless tobacco: Never  Vaping Use   Vaping Use: Never used  Substance and Sexual Activity   Alcohol use: No   Drug use: No   Sexual activity: Not on file  Other Topics Concern   Not on file  Social History Narrative   Not on file   Social Determinants of Health   Financial Resource Strain: Not on file  Food Insecurity: No Food Insecurity (08/11/2022)   Hunger Vital Sign    Worried About Running Out of Food in the Last Year: Never true    Ran Out of Food in the Last Year: Never true  Transportation Needs: No Transportation Needs (08/11/2022)   PRAPARE -  Administrator, Civil Service (Medical): No    Lack of Transportation (Non-Medical): No  Physical Activity: Not on file  Stress: Not on file  Social Connections: Not on file   Additional Social History:   Sleep: Good  Appetite:  Good  Current Medications: Current Facility-Administered Medications  Medication Dose Route Frequency Provider Last Rate Last Admin   acetaminophen (TYLENOL) tablet 650 mg  650 mg Oral Q6H PRN [name], MD   650 mg at 08/21/22 0935   albuterol (VENTOLIN HFA) 108 (90 Base) MCG/ACT inhaler 2 puff  2 puff Inhalation Q4H PRN [name], MD       alum & mag hydroxide-simeth (MAALOX/MYLANTA) 200-200-20 MG/5ML suspension 30 mL  30 mL Oral Q4H PRN [name], MD   30 mL at 08/29/22 0559   clomiPRAMINE (ANAFRANIL) capsule 25 mg  25 mg Oral Daily Tremaine Fuhriman, Jesusita Oka, FNP   25 mg at 08/30/22 0809   clomiPRAMINE (ANAFRANIL) capsule 50 mg  50 mg Oral QHS Sebrina Kessner C, FNP   50 mg at 08/30/22 2137   docusate sodium (COLACE) capsule 100 mg  100 mg Oral BID Massengill, [name], MD   100 mg at 08/30/22 1745   hydrOXYzine (ATARAX) tablet 25 mg  25 mg Oral TID PRN [name], MD   25 mg at 08/29/22 2102   lamoTRIgine (LAMICTAL) tablet 200 mg  200 mg Oral Daily Massengill, [name], MD   200 mg at 08/30/22 0809   LORazepam (ATIVAN) tablet 1 mg  1 mg Oral Q8H Massengill, Nathan, MD   1 mg at 08/31/22 1610   magnesium hydroxide (MILK OF MAGNESIA) suspension 30 mL  30 mL Oral Daily PRN [name], MD       melatonin tablet 5 mg  5 mg Oral QHS Massengill, [name], MD   5 mg at 08/30/22 2136   memantine (NAMENDA) tablet 10 mg  10 mg Oral Q12H Massengill, [name], MD   10 mg at 08/30/22 2011   metFORMIN (GLUCOPHAGE-XR) 24 hr tablet 500 mg  500 mg Oral Q breakfast Massengill, [name], MD   500 mg at 08/30/22 0809   polyethylene glycol (MIRALAX / GLYCOLAX) packet 17 g  17 g Oral Daily Massengill, [name], MD   17 g at 08/30/22 0809   propranolol ER (INDERAL LA) 24 hr capsule 60 mg  60 mg  Oral Daily Massengill, [name], MD   60 mg at 08/30/22 0809   QUEtiapine (SEROQUEL XR) 24 hr tablet 100 mg  100 mg Oral QHS Massengill, [name], MD   100 mg at 08/30/22 2136   topiramate (TOPAMAX) tablet 50 mg  50 mg Oral Q12H [name], MD   50 mg at 08/30/22 2012  traZODone (DESYREL) tablet 50 mg  50 mg Oral QHS PRN Massengill, [name], MD   50 mg at 08/29/22 2102   Lab Results:  Results for orders placed or performed during the hospital encounter of 08/11/22 (from the past 48 hour(s))  SARS Coronavirus 2 by RT PCR (hospital order, performed in [facility] hospital lab) *cepheid single result test* Anterior Nasal Swab     Status: None   Collection Time: 08/30/22  5:38 AM   Specimen: Anterior Nasal Swab  Result Value Ref Range   SARS Coronavirus 2 by RT PCR NEGATIVE NEGATIVE    Comment: (NOTE) SARS-CoV-2 target nucleic acids are NOT DETECTED.  The SARS-CoV-2 RNA is generally detectable in upper and lower respiratory specimens during the acute phase of infection. The lowest concentration of SARS-CoV-2 viral copies this assay can detect is 250 copies / mL. A negative result does not preclude SARS-CoV-2 infection and should not be used as the sole basis for treatment or other patient management decisions.  A negative result may occur with improper specimen collection / handling, submission of specimen other than nasopharyngeal swab, presence of viral mutation(s) within the areas targeted by this assay, and inadequate number of viral copies (<250 copies / mL). A negative result must be combined with clinical observations, patient history, and epidemiological information.  Fact Sheet for Patients:   RoadLapTop.co.za  Fact Sheet for Healthcare Providers: http://kim-miller.com/  This test is not yet approved or  cleared by the [place] FDA and has been authorized for detection and/or diagnosis of SARS-CoV-2 by FDA under an Emergency  Use Authorization (EUA).  This EUA will remain in effect (meaning this test can be used) for the duration of the COVID-19 declaration under Section 564(b)(1) of the Act, 21 U.S.C. section 360bbb-3(b)(1), unless the authorization is terminated or revoked sooner.  Performed at [facility], [address], [place], [place] [address]      Blood Alcohol level:  Lab Results  Component Value Date   ETH <10 08/10/2022   Metabolic Disorder Labs: Lab Results  Component Value Date   HGBA1C 5.8 (H) 08/10/2022   MPG 120 08/10/2022   No results found for: "PROLACTIN" Lab Results  Component Value Date   CHOL 202 (H) 08/12/2022   TRIG 154 (H) 08/12/2022   HDL 50 08/12/2022   CHOLHDL 4.0 08/12/2022   VLDL 31 08/12/2022   LDLCALC 121 (H) 08/12/2022   Physical Findings: AIMS: 0 CIWA: n/a COWS:n/a  Musculoskeletal: Strength & Muscle Tone: within normal limits Gait & Station: normal Patient leans: N/A  Psychiatric Specialty Exam:  Presentation  General Appearance:  Appropriate for Environment; Casual; Fairly Groomed  Eye Contact: Good  Speech: Clear and Coherent; Normal Rate  Speech Volume: Normal  Handedness: Right  Mood and Affect  Mood: Euthymic  Affect: Appropriate; Congruent  Thought Process  Thought Processes: Coherent  Descriptions of Associations:Intact  Orientation:Full (Time, Place and Person)  Thought Content:Logical  History of Schizophrenia/Schizoaffective disorder:No  Duration of Psychotic Symptoms:No data recorded Hallucinations:Hallucinations: None Description of Auditory Hallucinations: Denies Description of Visual Hallucinations: Denies  Ideas of Reference:None  Suicidal Thoughts:Suicidal Thoughts: No SI Active Intent and/or Plan: -- (Denies) SI Passive Intent and/or Plan: -- (Denies)  Homicidal Thoughts:Homicidal Thoughts: No HI Active Intent and/or Plan: -- (Denies) HI Passive Intent and/or Plan: --  (Denies)   Sensorium  Memory: Immediate Good; Recent Good  Judgment: Fair  Insight: Fair  Executive Functions  Concentration: Good  Attention Span: Good  Recall: Good  Fund of Knowledge: Fair  Language: Good  Psychomotor Activity  Psychomotor Activity: Psychomotor Activity: Normal   Assets  Assets: Communication Skills; Desire for Improvement; Physical Health; Social Support  Sleep  Sleep: Sleep: Good Number of Hours of Sleep: 8  Physical Exam: Physical Exam Vitals and nursing note reviewed.  Constitutional:      Appearance: Normal appearance.  HENT:     Head: Normocephalic.     Nose: Nose normal. No congestion or rhinorrhea.  Eyes:     Pupils: Pupils are equal, round, and reactive to light.  Cardiovascular:     Rate and Rhythm: Normal rate.     Pulses: Normal pulses.  Pulmonary:     Effort: Pulmonary effort is normal.  Genitourinary:    Comments: Deferred Musculoskeletal:        General: Normal range of motion.     Cervical back: Normal range of motion.  Skin:    General: Skin is warm.  Neurological:     Mental Status: She is alert and oriented to person, place, and time.  Psychiatric:        Mood and Affect: Mood normal.        Behavior: Behavior normal.    Review of Systems  Constitutional:  Negative for chills and fever.  HENT: Negative.  Negative for hearing loss.   Eyes: Negative.  Negative for blurred vision.  Respiratory: Negative.  Negative for cough.   Cardiovascular: Negative.  Negative for chest pain.  Gastrointestinal: Negative.   Genitourinary: Negative.   Musculoskeletal: Negative.   Skin: Negative.  Negative for rash.  Neurological: Negative.  Negative for dizziness and headaches.  Psychiatric/Behavioral:  Positive for depression and suicidal ideas. Negative for hallucinations, memory loss and substance abuse. The patient is nervous/anxious. The patient does not have insomnia.    Blood pressure 116/79, pulse 79,  temperature 98.6 F (37 C), temperature source Oral, resp. rate 16, height 5\' 5"  (1.651 m), weight 91.6 kg, SpO2 96 %. Body mass index is 33.61 kg/m.  Treatment Plan Summary: Daily contact with patient to assess and evaluate symptoms and progress in treatment and Medication management   Observation Level/Precautions:  15 minute checks  Laboratory:  Labs reviewed   Psychotherapy:  Unit Group sessions  [facility]  Consultations:  To be determined   Discharge Concerns:  Safety, medication compliance, mood stability  Estimated LOS: 5-7 days  Other:  N/A    Labs Reviewed independently on 08/28/2022:  CMP WNL, Lipid panel reviewed (cholesterol 202, LDL-121, Triglycerides 154-educated on healthy food choices and exercise. HA1C is 5.8 rendering pt a prediabetic-will need PCP f/u after dc. TSH WNL. UA hazy with rare bacteria otherwise WNL. EKG from 12/04 with QTC-445. Repeat EKG from 12/21 with Qtc of 446.   PLAN Safety and Monitoring: Voluntary admission to inpatient psychiatric unit for safety, stabilization and treatment Daily contact with patient to assess and evaluate symptoms and progress in treatment Patient's case to be discussed in multi-disciplinary team meeting Observation Level : q15 minute checks Vital signs: q12 hours Precautions: Safety   Long Term Goal(s): Improvement in symptoms so as ready for discharge   Short Term Goals: Ability to identify changes in lifestyle to reduce recurrence of condition will improve, Ability to verbalize feelings will improve, Ability to disclose and discuss suicidal ideas, Ability to demonstrate self-control will improve, Ability to identify and develop effective coping behaviors will improve, and Compliance with prescribed medications will improve   Diagnoses:  Active Problems:  Asthma   Bipolar I disorder, most recent episode depressed, severe without psychotic features (HCC)   Anxiety   Insomnia   Essential hypertension    Medications: MDD/mood stabilization -Continue Seroquel XR 100 mg nightly for sleep/mood -Continue  Anafranil 25 mg daily in am for depressive symptoms/OCD(Educated to abstain from Tyramine containing foods due  to dangerous interactions with this class of medications). -Initiate  Anafranil 50 mg q. nightly daily for depressive symptoms/OCD Educated to abstain from Tyramine containing foods due  to dangerous interactions with this class of medications) 08/29/22. -Continue Topamax to 50 mg BID for OCD -Continue Lamictal 200 mg daily for mood stabilization (home med) -Discontinued Prozac 60 mg d/t lack of efficacy (08/27/22) -Discontinued Zyprexa 20 mg nightly due to lack of efficacy (08/27/22)  Catatonia & OCD -Continue Ativan to 1 mg TID for catatonia -Continue Namenda to 10 mg BID for OCD Consulted with Dr. Luretha Murphy with proposed plan to taper off on Topamax and Namenda for OCD  with outpatient provider after discharge due to reduced effectiveness on long-term use.  Anxiety -Continue Hydroxyzine 25 mg every 6 hours PRN -Continue Albuterol PRN Q 4 H for wheezing/SOB -Continue Inderal LA 60 mg daily for anxiety/tachycardia (home med) -Discontinue Seroquel 300 mg nightly d/t lack of efficacy (08/17/22) -Discontinue Seroquel 100 mg at bedtime PRN (08/17/22) -Nifedipine completely tapered off on 12/19 (home med) -Given Ativan 2 mg IM x 0ne dose for catatonia on 12/06 with good response  Insomnia -Continue Trazodone 50 mg nightly and 50 mg PRN for insomnia  Other PRNS -Continue Tylenol 650 mg every 6 hours PRN for mild pain -Continue Maalox 30 mg every 4 hrs PRN for indigestion -Continue Milk of Magnesia as needed every 6 hrs for constipation   Discharge Planning: Social work and case management to assist with discharge planning and identification of hospital follow-up needs prior to discharge Estimated LOS: 5-7 days Discharge Concerns: Need to establish a safety plan; Medication  compliance and effectiveness Discharge Goals: Return home with outpatient referrals for mental health follow-up including medication management/psychotherapy   Cecilie Lowers, FNP 08/31/2022, 8:49 AMPatient ID: Kara Stephens, female   DOB: April 05, 1986, 36 y.o.   MRN: [id number] Patient ID: Kara Stephens, Patient ID: Kara Stephens, female   Patient ID: Kara Stephens, female   DOB: 15-Feb-1986, 51 y.o. Patient ID: Kara Stephens, female   DOB: [birth year], 60 y.o.   MRN: [id number] Patient ID: Kara Stephens, female   DOB: 01-30-1986, 57 y.o.   MRN: [id number] Patient ID: Kara Stephens, female   DOB: [birth year], 39 y.o.   MRN: [id number] Patient ID: Kara Stephens, female   DOB: Jan 19, 1986, 29 y.o.   MRN: [id number]

## 2022-08-31 NOTE — Progress Notes (Signed)
Patient wound care completed 12/15 am.

## 2022-08-31 NOTE — Progress Notes (Signed)
   08/31/22 0541  Sleep  Number of Hours 6

## 2022-08-31 NOTE — Plan of Care (Signed)
Nurse discussed anxiety, depression and coping skills with patient.  

## 2022-08-31 NOTE — Progress Notes (Signed)
Nurse changed dressing on patient's lower back.  Cleaned with normal sale, applied dry dressing and covered with two bandaids.  Patient tolerated well.  Patient's wound in healing well.  No swelling, no redness no odor.

## 2022-08-31 NOTE — Plan of Care (Signed)
  Problem: Education: Goal: Emotional status will improve Outcome: Progressing Goal: Mental status will improve Outcome: Progressing Goal: Verbalization of understanding the information provided will improve Outcome: Progressing   Problem: Activity: Goal: Interest or engagement in activities will improve Outcome: Progressing   Problem: Coping: Goal: Ability to demonstrate self-control will improve Outcome: Progressing   Problem: Health Behavior/Discharge Planning: Goal: Compliance with treatment plan for underlying cause of condition will improve Outcome: Progressing   

## 2022-08-31 NOTE — Progress Notes (Signed)
   08/30/22 2130  Psych Admission Type (Psych Patients Only)  Admission Status Voluntary  Psychosocial Assessment  Patient Complaints Anxiety;Depression  Eye Contact Fair  Facial Expression Anxious  Affect Appropriate to circumstance  Speech Logical/coherent  Interaction Assertive  Motor Activity Slow  Appearance/Hygiene Unremarkable  Behavior Characteristics Cooperative;Anxious  Mood Anxious;Pleasant  Thought Process  Coherency WDL  Content WDL  Delusions None reported or observed  Perception WDL  Hallucination None reported or observed  Judgment Limited  Confusion None  Danger to Self  Current suicidal ideation? Denies  Self-Injurious Behavior No self-injurious ideation or behavior indicators observed or expressed   Agreement Not to Harm Self Yes  Description of Agreement Verbally contracts for safety.  Danger to Others  Danger to Others None reported or observed  Danger to Others Abnormal  Harmful Behavior to others No threats or harm toward other people  Destructive Behavior No threats or harm toward property   Patient alert and oriented. Presenting appropriate to circumstance with an anxious, pleasant mood. Patient denies SI, HI, AVH, and pain. Patient rates anxiety 9/10 and depression 8/10 on 0-10 scale, with 0 being the least and 10 being the worst. Scheduled medications administered to patient, per provider orders. Support and encouragement provided. Patient verbally contracts for safety and remains safe on unit.

## 2022-08-31 NOTE — Progress Notes (Signed)
D:  Patient denied SI and HI, contracts for safety.  Denied A/V hallucinations.  Denied pain. A:  Medications administered per MD orders.  Emotional support and encouragement given patient. R:  Safety maintained with 15 minute checks.  

## 2022-08-31 NOTE — [facility] Group Notes (Signed)
Patient attended goals group. Patient shared that her goal is "to get better by going to groups and taking my medicine".

## 2022-08-31 NOTE — Progress Notes (Signed)
    08/31/22 2055  Psych Admission Type (Psych Patients Only)  Admission Status Voluntary  Psychosocial Assessment  Patient Complaints Anxiety  Eye Contact Fair  Facial Expression Anxious  Affect Appropriate to circumstance  Speech Logical/coherent  Interaction Assertive  Motor Activity Slow  Appearance/Hygiene Unremarkable  Behavior Characteristics Cooperative;Anxious  Mood Pleasant;Anxious  Thought Process  Coherency WDL  Content WDL  Delusions None reported or observed  Perception WDL  Hallucination None reported or observed  Judgment Limited  Confusion None  Danger to Self  Current suicidal ideation? Denies  Agreement Not to Harm Self Yes  Description of Agreement verbal  Danger to Others  Danger to Others None reported or observed  Danger to Others Abnormal  Harmful Behavior to others No threats or harm toward other people  Destructive Behavior No threats or harm toward property

## 2022-08-31 NOTE — Group Note (Signed)
Recreation Therapy Group Note   Group Topic:Problem Solving  Group Date: 08/31/2022 Start Time: 0930 End Time: 1000 Facilitators: Zamirah Denny-McCall, LRT,CTRS Location: [address] Dayroom   Goal Area(s) Addresses:  Patient will effectively work in a team with other group members. Patient will verbalize importance of using appropriate problem solving techniques.  Patient will identify positive change associated with effective problem solving skills.    Group Description: Christmas Trivia.  Patients were divided into two teams.  LRT conducted three rounds of trivia with the patients.  The value of each question, was written beside each line were the answer was to be written.  After each round, patients will count of their points and write them at the bottom of the sheet.  At the end of the game, patients will add the totals of all three rounds together.  The team with the most points, wins the game.     Affect/Mood: Appropriate   Participation Level: Engaged   Participation Quality: Independent   Behavior: Appropriate   Speech/Thought Process: Focused   Insight: Good   Judgement: Good   Modes of Intervention: Competitive Play   Patient Response to Interventions:  Engaged   Education Outcome:  Acknowledges education and In group clarification offered    Clinical Observations/Individualized Feedback: Pt was engaged and social with peers.  Pt worked well with peers in coming up with the answers for the questions.  Pt was was appropriate during group.   Plan: Continue to engage patient in RT group sessions 2-3x/week.   Kiwana Deblasi-McCall, LRT,CTRS  08/31/2022 11:50 AM

## 2022-08-31 NOTE — [facility] Group Notes (Signed)
Patient attended the AA group. 

## 2022-08-31 NOTE — BH IP Treatment Plan (Signed)
Interdisciplinary Treatment and Diagnostic Plan Update  08/31/2022 Time of Session: 1022 Kara Stephens MRN: [id number]  Principal Diagnosis: Bipolar 1 disorder, mixed (HCC)  Secondary Diagnoses: Active Problems:   Asthma   Bipolar I disorder, most recent episode depressed, severe without psychotic features (HCC)   Anxiety   Insomnia   Essential hypertension   COVID-19   Current Medications:  Current Facility-Administered Medications  Medication Dose Route Frequency Provider Last Rate Last Admin   acetaminophen (TYLENOL) tablet 650 mg  650 mg Oral Q6H PRN [name], MD   650 mg at 08/21/22 0935   albuterol (VENTOLIN HFA) 108 (90 Base) MCG/ACT inhaler 2 puff  2 puff Inhalation Q4H PRN [name], MD       alum & mag hydroxide-simeth (MAALOX/MYLANTA) 200-200-20 MG/5ML suspension 30 mL  30 mL Oral Q4H PRN [name], MD   30 mL at 08/29/22 0559   clomiPRAMINE (ANAFRANIL) capsule 25 mg  25 mg Oral Daily Ntuen, Jesusita Oka, FNP   25 mg at 08/31/22 0809   clomiPRAMINE (ANAFRANIL) capsule 50 mg  50 mg Oral QHS Cecilie Lowers, FNP   50 mg at 08/30/22 2137   docusate sodium (COLACE) capsule 100 mg  100 mg Oral BID [name], MD   100 mg at 08/31/22 0810   hydrOXYzine (ATARAX) tablet 25 mg  25 mg Oral TID PRN [name], MD   25 mg at 08/29/22 2102   lamoTRIgine (LAMICTAL) tablet 200 mg  200 mg Oral Daily Massengill, [name], MD   200 mg at 08/31/22 0810   LORazepam (ATIVAN) tablet 1 mg  1 mg Oral Q8H Massengill, [name], MD   1 mg at 08/31/22 6333   magnesium hydroxide (MILK OF MAGNESIA) suspension 30 mL  30 mL Oral Daily PRN [name], MD       melatonin tablet 5 mg  5 mg Oral QHS Massengill, [name], MD   5 mg at 08/30/22 2136   memantine (NAMENDA) tablet 10 mg  10 mg Oral Q12H Massengill, [name], MD   10 mg at 08/31/22 5456   metFORMIN (GLUCOPHAGE-XR) 24 hr tablet 500 mg  500 mg Oral Q breakfast Massengill, [name], MD   500 mg at 08/31/22 0809   polyethylene glycol (MIRALAX / GLYCOLAX)  packet 17 g  17 g Oral Daily Massengill, [name], MD   17 g at 08/31/22 2563   propranolol ER (INDERAL LA) 24 hr capsule 60 mg  60 mg Oral Daily Massengill, [name], MD   60 mg at 08/31/22 8937   QUEtiapine (SEROQUEL XR) 24 hr tablet 100 mg  100 mg Oral QHS Massengill, [name], MD   100 mg at 08/30/22 2136   topiramate (TOPAMAX) tablet 50 mg  50 mg Oral Q12H Massengill, [name], MD   50 mg at 08/31/22 0810   traZODone (DESYREL) tablet 50 mg  50 mg Oral QHS PRN [name], MD   50 mg at 08/29/22 2102   PTA Medications: Medications Prior to Admission  Medication Sig Dispense Refill Last Dose   albuterol (VENTOLIN HFA) 108 (90 Base) MCG/ACT inhaler Inhale 2 puffs into the lungs every 4 (four) hours as needed for wheezing or shortness of breath (cough, shortness of breath or wheezing.). (Patient taking differently: Inhale 2 puffs into the lungs every 4 (four) hours as needed for wheezing or shortness of breath.) 6.7 g 3    clonazePAM (KLONOPIN) 0.5 MG tablet Take 1 tablet (0.5 mg total) by mouth 2 (two) times daily. 30 tablet 0  lamoTRIgine (LAMICTAL) 150 MG tablet Take 1 tablet (150 mg total) by mouth daily.      NIFEdipine (PROCARDIA XL/NIFEDICAL-XL) 90 MG 24 hr tablet Take 90 mg by mouth at bedtime.      QUEtiapine (SEROQUEL) 100 MG tablet Take 1 tablet (100 mg total) by mouth at bedtime.      traZODone (DESYREL) 100 MG tablet Take 1 tablet (100 mg total) by mouth at bedtime as needed.       Patient Stressors: Financial difficulties   Health problems    Patient Strengths: Supportive family/friends   Treatment Modalities: Medication Management, Group therapy, Case management,  1 to 1 session with clinician, Psychoeducation, Recreational therapy.   Physician Treatment Plan for Primary Diagnosis: Bipolar 1 disorder, mixed (HCC) Long Term Goal(s): Improvement in symptoms so as ready for discharge   Short Term Goals: Ability to identify changes in lifestyle to reduce recurrence of  condition will improve Ability to verbalize feelings will improve Ability to disclose and discuss suicidal ideas Ability to demonstrate self-control will improve Ability to identify and develop effective coping behaviors will improve Compliance with prescribed medications will improve  Medication Management: Evaluate patient's response, side effects, and tolerance of medication regimen.  Therapeutic Interventions: 1 to 1 sessions, Unit Group sessions and Medication administration.  Evaluation of Outcomes: Progressing  Physician Treatment Plan for Secondary Diagnosis: Active Problems:   Asthma   Bipolar I disorder, most recent episode depressed, severe without psychotic features (HCC)   Anxiety   Insomnia   Essential hypertension   COVID-19  Long Term Goal(s): Improvement in symptoms so as ready for discharge   Short Term Goals: Ability to identify changes in lifestyle to reduce recurrence of condition will improve Ability to verbalize feelings will improve Ability to disclose and discuss suicidal ideas Ability to demonstrate self-control will improve Ability to identify and develop effective coping behaviors will improve Compliance with prescribed medications will improve     Medication Management: Evaluate patient's response, side effects, and tolerance of medication regimen.  Therapeutic Interventions: 1 to 1 sessions, Unit Group sessions and Medication administration.  Evaluation of Outcomes: Progressing   RN Treatment Plan for Primary Diagnosis: Bipolar 1 disorder, mixed (HCC) Long Term Goal(s): Knowledge of disease and therapeutic regimen to maintain health will improve  Short Term Goals: Ability to remain free from injury will improve, Ability to verbalize frustration and anger appropriately will improve, Ability to demonstrate self-control, Ability to participate in decision making will improve, Ability to verbalize feelings will improve, Ability to disclose and discuss  suicidal ideas, Ability to identify and develop effective coping behaviors will improve, and Compliance with prescribed medications will improve  Medication Management: RN will administer medications as ordered by provider, will assess and evaluate patient's response and provide education to patient for prescribed medication. RN will report any adverse and/or side effects to prescribing provider.  Therapeutic Interventions: 1 on 1 counseling sessions, Psychoeducation, Medication administration, Evaluate responses to treatment, Monitor vital signs and CBGs as ordered, Perform/monitor CIWA, COWS, AIMS and Fall Risk screenings as ordered, Perform wound care treatments as ordered.  Evaluation of Outcomes: Progressing   LCSW Treatment Plan for Primary Diagnosis: Bipolar 1 disorder, mixed (HCC) Long Term Goal(s): Safe transition to appropriate next level of care at discharge, Engage patient in therapeutic group addressing interpersonal concerns.  Short Term Goals: Engage patient in aftercare planning with referrals and resources, Increase social support, Increase ability to appropriately verbalize feelings, Increase emotional regulation, Facilitate acceptance of mental health diagnosis and  concerns, Facilitate patient progression through stages of change regarding substance use diagnoses and concerns, Identify triggers associated with mental health/substance abuse issues, and Increase skills for wellness and recovery  Therapeutic Interventions: Assess for all discharge needs, 1 to 1 time with Social worker, Explore available resources and support systems, Assess for adequacy in community support network, Educate family and significant other(s) on suicide prevention, Complete Psychosocial Assessment, Interpersonal group therapy.  Evaluation of Outcomes: Progressing   Progress in Treatment: Attending groups: No. Participating in groups: Yes. Taking medication as prescribed: Yes. Toleration medication:  Yes. Family/Significant other contact made: Yes, individual(s) contacted:  Neziah Vogelgesang [phone] (Husband)  Patient understands diagnosis: Yes. Discussing patient identified problems/goals with staff: Yes. Medical problems stabilized or resolved: Yes. Denies suicidal/homicidal ideation: Yes. Issues/concerns per patient self-inventory: Yes. Other:   New problem(s) identified: No, Describe:  None Reported  New Short Term/Long Term Goal(s): Medication Stabilization  Patient Goals:  Coping Skills  Discharge Plan or Barriers:   Reason for Continuation of Hospitalization: Anxiety Depression Medication stabilization Suicidal ideation  Estimated Length of Stay: 3-7 Days  Last 3 [place] Suicide Severity Risk Score: Flowsheet Row [facility] (Current[facility] Most recent reading at 08/14/2022  1:35 PM ED from 08/10/2022 in [facility] Most recent reading at 08/11/2022  7:44 AM ED from 08/10/2022 in [facility] Most recent reading at 08/10/2022  7:46 PM  C-SSRS RISK CATEGORY Low Risk High Risk Error: Q7 should not be populated when Q6 is No       Last PHQ 2/9 Scores:    08/10/2022    7:45 PM  Depression screen PHQ 2/9  Decreased Interest 3  Down, Depressed, Hopeless 3  PHQ - 2 Score 6  Altered sleeping 3  Tired, decreased energy 1  Change in appetite 3  Feeling bad or failure about yourself  1  Trouble concentrating 2  Moving slowly or fidgety/restless 2  Suicidal thoughts 2  PHQ-9 Score 20  Difficult doing work/chores Very difficult    medication stabilization, elimination of SI thoughts, development of comprehensive mental wellness plan.   Scribe for Treatment Team: Ane Payment, LCSW 08/31/2022 11:41 AM

## 2022-09-01 DIAGNOSIS — F3160 Bipolar disorder, current episode mixed, unspecified: Secondary | ICD-10-CM | POA: Diagnosis not present

## 2022-09-01 NOTE — Progress Notes (Signed)
D:  Patient's self inventory sheet, patient has fair sleep, no sleep medication.  Fair appetite, normal energy level, good concentration.  Rated depression, hopeless and anxiety 5.  Denied withdrawals.  Denied SI.  Denied physical problems.  Denied physical pain.  Goal is get better.  Plans to attend groups, take meds.  No discharge plan. A:  Medications administered per MD orders.  Emotional support and encouragement.   R:  Safety maintained with 15 minute checks.

## 2022-09-01 NOTE — Plan of Care (Signed)
Nurse discussed anxiety, depression and coping skills for patient. 

## 2022-09-01 NOTE — Progress Notes (Signed)
   On assessment, patient is alert and oriented.  Patient presents with moderate anxiety.  Patient is smiling and appears brighter today.  Patient is observed in group and interacting with her peers in the dayroom.  Patient reports having a good day, 9/10, related to a visit today by her spouse and daughter.  Patient denies SI/HI/AVH.  Patient contracts for safety.  Administered scheduled medications.  Patient was provided emotional support.  Patient is safe on the unit with Q 15 minute safety checks.

## 2022-09-01 NOTE — Group Note (Signed)
LCSW Group Therapy Note  Group Date: 09/01/2022 Start Time: 1100 End Time: 1200   Type of Therapy and Topic:  Group Therapy - How To Cope with Nervousness about Discharge   Participation Level:  Active   Description of Group This process group involved identification of patients' feelings about discharge. Some of them are scheduled to be discharged soon, while others are new admissions, but each of them was asked to share thoughts and feelings surrounding discharge from the hospital. One common theme was that they are excited at the prospect of going home, while another was that many of them are apprehensive about sharing why they were hospitalized. Patients were given the opportunity to discuss these feelings with their peers in preparation for discharge.  Therapeutic Goals  Patient will identify their overall feelings about pending discharge. Patient will think about how they might proactively address issues that they believe will once again arise once they get home (i.e. with parents). Patients will participate in discussion about having hope for change.   Summary of Patient Progress:  Kara Stephens was very active throughout the session. Kara Stephens demonstrated good insight into the subject matter, and proved open to input from peers and feedback from CSW. Kara Stephens was respectful of peers and participated throughout the entire session.   Therapeutic Modalities Cognitive Behavioral Therapy   Ane Payment, LCSW 09/01/2022  1:37 PM

## 2022-09-01 NOTE — [facility] Group Notes (Signed)
Adult Psychoeducational Group Note  Date:  09/01/2022 Time:  4:10 PM  Group Topic/Focus:  Healthy Communication:   The focus of this group is to discuss communication, barriers to communication, as well as healthy ways to communicate with others.  Participation Level:  Active  Participation Quality:  Appropriate  Affect:  Appropriate  Cognitive:  Appropriate  Insight: Appropriate  Engagement in Group:  Engaged  Modes of Intervention:  Education  Additional Comments:  PT participated in group " Be impeccable with your words"  [name] 09/01/2022, 4:10 PM

## 2022-09-01 NOTE — Plan of Care (Signed)
  Problem: Education: Goal: Emotional status will improve Outcome: Progressing Goal: Mental status will improve Outcome: Progressing   Problem: Activity: Goal: Interest or engagement in activities will improve Outcome: Progressing   Problem: Coping: Goal: Ability to demonstrate self-control will improve Outcome: Progressing   Problem: Health Behavior/Discharge Planning: Goal: Compliance with treatment plan for underlying cause of condition will improve Outcome: Progressing   

## 2022-09-01 NOTE — Progress Notes (Signed)
Patient's wound care was completed per MD order.  No odor, swelling, redness or drainage.  Patient tolerated well.  Patient denied pain.

## 2022-09-01 NOTE — Group Note (Signed)
Date:  09/01/2022 Time:  9:41 AM  Group Topic/Focus:  Goals Group:   The focus of this group is to help patients establish daily goals to achieve during treatment and discuss how the patient can incorporate goal setting into their daily lives to aide in recovery. Orientation:   The focus of this group is to educate the patient on the purpose and policies of crisis stabilization and provide a format to answer questions about their admission.  The group details unit policies and expectations of patients while admitted.    Participation Level:  Did Not Attend  Participation Quality:   PT did not attend  Affect:   PT did not attend  Cognitive:   PT did not attend  Insight: None  Engagement in Group:   PT did not attend  Modes of Intervention:   PT did not attend  Additional Comments:  PT did not attend  [name] 09/01/2022, 9:41 AM

## 2022-09-01 NOTE — Group Note (Signed)
Recreation Therapy Group Note   Group Topic:Animal Assisted Therapy   Group Date: 09/01/2022 Start Time: 1430 End Time: 1505 Facilitators: Alianna Wurster-McCall, LRT,CTRS Location: 300 Hall Dayroom   Animal-Assisted Activity (AAA) Program Checklist/Progress Notes Patient Eligibility Criteria Checklist & Daily Group note for Rec Tx Intervention  AAA/T Program Assumption of Risk Form signed by Patient/ or Parent Legal Guardian Yes  Patient is free of allergies or severe asthma Yes  Patient reports no fear of animals Yes  Patient reports no history of cruelty to animals Yes  Patient understands his/her participation is voluntary Yes  Patient washes hands before animal contact Yes  Patient washes hands after animal contact Yes   Affect/Mood: Appropriate   Participation Level: Engaged   Participation Quality: Independent   Behavior: Appropriate    Clinical Observations/Individualized Feedback:  Patient attended session and interacted appropriately with therapy dog and peers. Patient asked appropriate questions about therapy dog and his training. Patient shared stories about their pets at home with group.    Plan: Continue to engage patient in RT group sessions 2-3x/week.   Lene Mckay-McCall, LRT,CTRS 09/01/2022 3:27 PM

## 2022-09-01 NOTE — Group Note (Signed)
Date:  09/01/2022 Time:  10:04 AM  Group Topic/Focus:  Goals Group:   The focus of this group is to help patients establish daily goals to achieve during treatment and discuss how the patient can incorporate goal setting into their daily lives to aide in recovery.    Participation Level:  PT did not attend group  Participation Quality:    Affect:    Cognitive:    Insight:   Engagement in Group:    Modes of Intervention:    Additional Comments:  PT's goal is to get better  [name] 09/01/2022, 10:04 AM

## 2022-09-01 NOTE — Progress Notes (Addendum)
[facility] MD Progress Note  09/01/2022 10:27 AM LEXIS POTENZA  MRN:  [id number]  Principal Problem: Bipolar 1 disorder, mixed (HCC)  Diagnosis: Active Problems:   Asthma   Bipolar I disorder, most recent episode depressed, severe without psychotic features (HCC)   Anxiety   Insomnia   Essential hypertension   COVID-19  Subjective information: "I feel okay. I feel good. Tired. I was having intrusive thoughts of throwing my 41  month old baby over the balcony".   HPI: Kara Stephens is a 36 yo Caucasian female with a past psychiatric history of anxiety, obesessive compulsive disorder, bipolar 1 disorder who presented to the [facility] urgent care [facility]) accompanied by her husband on 08/10/22 with complaints of worsening depressive symptoms with a plan to jump off their 2nd story apartment building & intrusive thoughts to harming her 29 month old baby. Pt was transferred voluntarily to [facility] 08/11/22 for treatment and stabilization.     24 hr chart review: Vital signs for the past 24 hrs have been WNL; HR 83. Pt remains compliant with all scheduled medications.  No PRN required for past 24 hours. Pt remains visible in milieu intermittently participating in unit activities.  Today's assessment: On assessment today patient presents in her room. Casual appearance; reports last shower as yesterday. Visible hirsutism on chin area; patient reports current on Mirena IUD since birth of son in July 2023. Last menstrual cycle 'since admission' on unit. Slow responding to questions with some paucity in speech. Mood appears withdrawn, depressed, and flat. Inconsistent and minimal eye contact. Affect incongruent to report. Pt endorses 'good' sleep and appetite; denies physical or safety concerns.  She denies suicidal/homicidal ideations, auditory or visual hallucinations, intrusive thoughts; reports last instance "a couple of days ago". Per chart review, patient has been improving  over past few days denying any paranoia or delusional thinking.  She remains on Clomipramine 25 mg PO daily, 50 mg PO HS with no recent changes. She denies any medication related side effects, states that nursing staff is completing dressing changes to the wound on her sacrum. Patient tolerating medication without any noted side effects. Vital signs and lab reviewed, with no critical values.  Collateral: [name] (spouse) [phone] no answer  Total Time spent with patient: 45 minutes  Past Psychiatric History: Bipolar d/o  Past Medical History:  Past Medical History:  Diagnosis Date   Anxiety    Back spasm    Bipolar I disorder (HCC)    Hypertension    Tachycardia     Past Surgical History:  Procedure Laterality Date   APPENDECTOMY     CHOLECYSTECTOMY     HAND SURGERY     SHOULDER SURGERY     Family History:  Family History  Problem Relation Age of Onset   Hypertension Mother    Hyperlipidemia Mother    Suicidality Father    Depression Father    Family Psychiatric  History: See above Social History:  Social History   Substance and Sexual Activity  Alcohol Use No     Social History   Substance and Sexual Activity  Drug Use No    Social History   Socioeconomic History   Marital status: Married    Spouse name: Not on file   Number of children: Not on file   Years of education: Not on file   Highest education level: Not on file  Occupational History   Not on file  Tobacco Use  Smoking status: Former    Types: Cigarettes    Quit date: 09/10/2016    Years since quitting: 5.9   Smokeless tobacco: Never  Vaping Use   Vaping Use: Never used  Substance and Sexual Activity   Alcohol use: No   Drug use: No   Sexual activity: Not on file  Other Topics Concern   Not on file  Social History Narrative   Not on file   Social Determinants of Health   Financial Resource Strain: Not on file  Food Insecurity: No Food Insecurity (08/11/2022)   Hunger  Vital Sign    Worried About Running Out of Food in the Last Year: Never true    Ran Out of Food in the Last Year: Never true  Transportation Needs: No Transportation Needs (08/11/2022)   PRAPARE - Administrator, Civil Service (Medical): No    Lack of Transportation (Non-Medical): No  Physical Activity: Not on file  Stress: Not on file  Social Connections: Not on file   Additional Social History:   Sleep: Good  Appetite:  Good  Current Medications: Current Facility-Administered Medications  Medication Dose Route Frequency Provider Last Rate Last Admin   acetaminophen (TYLENOL) tablet 650 mg  650 mg Oral Q6H PRN [name], MD   650 mg at 08/21/22 0935   albuterol (VENTOLIN HFA) 108 (90 Base) MCG/ACT inhaler 2 puff  2 puff Inhalation Q4H PRN [name], MD       alum & mag hydroxide-simeth (MAALOX/MYLANTA) 200-200-20 MG/5ML suspension 30 mL  30 mL Oral Q4H PRN [name], MD   30 mL at 08/29/22 0559   clomiPRAMINE (ANAFRANIL) capsule 25 mg  25 mg Oral Daily Ntuen, Inetta Fermo C, FNP   25 mg at 09/01/22 0277   clomiPRAMINE (ANAFRANIL) capsule 50 mg  50 mg Oral QHS Ntuen, Tina C, FNP   50 mg at 08/31/22 2058   docusate sodium (COLACE) capsule 100 mg  100 mg Oral BID Massengill, [name], MD   100 mg at 09/01/22 0810   hydrOXYzine (ATARAX) tablet 25 mg  25 mg Oral TID PRN [name], MD   25 mg at 08/29/22 2102   lamoTRIgine (LAMICTAL) tablet 200 mg  200 mg Oral Daily Massengill, [name], MD   200 mg at 09/01/22 0811   LORazepam (ATIVAN) tablet 1 mg  1 mg Oral Q8H Massengill, Nathan, MD   1 mg at 09/01/22 4128   magnesium hydroxide (MILK OF MAGNESIA) suspension 30 mL  30 mL Oral Daily PRN [name], MD       melatonin tablet 5 mg  5 mg Oral QHS Massengill, [name], MD   5 mg at 08/31/22 2059   memantine (NAMENDA) tablet 10 mg  10 mg Oral Q12H Massengill, [name], MD   10 mg at 09/01/22 7867   metFORMIN (GLUCOPHAGE-XR) 24 hr tablet 500 mg  500 mg Oral Q breakfast Massengill, [name], MD   500  mg at 09/01/22 6720   polyethylene glycol (MIRALAX / GLYCOLAX) packet 17 g  17 g Oral Daily Massengill, [name], MD   17 g at 09/01/22 0810   propranolol ER (INDERAL LA) 24 hr capsule 60 mg  60 mg Oral Daily Massengill, [name], MD   60 mg at 09/01/22 9470   QUEtiapine (SEROQUEL XR) 24 hr tablet 100 mg  100 mg Oral QHS Massengill, [name], MD   100 mg at 08/31/22 2059   topiramate (TOPAMAX) tablet 50 mg  50 mg Oral Q12H [name], MD  50 mg at 09/01/22 0813   traZODone (DESYREL) tablet 50 mg  50 mg Oral QHS PRN Phineas InchesMassengill, Nathan, MD   50 mg at 08/29/22 2102   Lab Results:  No results found for this or any previous visit (from the past 48 hour(s)).  Blood Alcohol level:  Lab Results  Component Value Date   ETH <10 08/10/2022   Metabolic Disorder Labs: Lab Results  Component Value Date   HGBA1C 5.8 (H) 08/10/2022   MPG 120 08/10/2022   No results found for: "PROLACTIN" Lab Results  Component Value Date   CHOL 202 (H) 08/12/2022   TRIG 154 (H) 08/12/2022   HDL 50 08/12/2022   CHOLHDL 4.0 08/12/2022   VLDL 31 08/12/2022   LDLCALC 121 (H) 08/12/2022   Physical Findings: AIMS: 0 CIWA: n/a COWS:n/a  Musculoskeletal: Strength & Muscle Tone: within normal limits Gait & Station: normal Patient leans: N/A  Psychiatric Specialty Exam:  Presentation  General Appearance:  Casual  Eye Contact: Fleeting  Speech: Slow  Speech Volume: Normal  Handedness: Right  Mood and Affect  Mood: Dysphoric  Affect: Non-Congruent; Flat  Thought Process  Thought Processes: Linear  Descriptions of Associations:Intact  Orientation:Full (Time, Place and Person)  Thought Content:Logical  History of Schizophrenia/Schizoaffective disorder:No  Duration of Psychotic Symptoms:No data recorded Hallucinations:Hallucinations: None Description of Auditory Hallucinations: Denies Description of Visual Hallucinations: Denies  Ideas of Reference:None  Suicidal  Thoughts:Suicidal Thoughts: No SI Active Intent and/or Plan: -- (Denies) SI Passive Intent and/or Plan: -- (Denies)  Homicidal Thoughts:Homicidal Thoughts: No HI Active Intent and/or Plan: -- (Denies) HI Passive Intent and/or Plan: -- (Denies)  Sensorium  Memory: Immediate Fair; Recent Fair  Judgment: Fair  Insight: Shallow  Executive Functions  Concentration: Poor  Attention Span: Fair  Recall: FiservFair  Fund of Knowledge: Fair  Language: Fair  Psychomotor Activity  Psychomotor Activity: Psychomotor Activity: Psychomotor Retardation  Assets  Assets: Financial Resources/Insurance; Housing; Physical Health; Social Support  Sleep  Sleep: Sleep: Good Number of Hours of Sleep: 8  Physical Exam: Physical Exam Vitals and nursing note reviewed.  Constitutional:      Appearance: Normal appearance.  HENT:     Head: Normocephalic.     Nose: Nose normal. No congestion or rhinorrhea.  Eyes:     Pupils: Pupils are equal, round, and reactive to light.  Cardiovascular:     Rate and Rhythm: Normal rate.     Pulses: Normal pulses.  Pulmonary:     Effort: Pulmonary effort is normal.  Genitourinary:    Comments: Deferred Musculoskeletal:        General: Normal range of motion.     Cervical back: Normal range of motion.  Skin:    General: Skin is warm.  Neurological:     Mental Status: She is alert and oriented to person, place, and time.  Psychiatric:        Mood and Affect: Mood normal.        Behavior: Behavior normal.    Review of Systems  Constitutional:  Negative for chills and fever.  HENT: Negative.  Negative for hearing loss.   Eyes: Negative.  Negative for blurred vision.  Respiratory: Negative.  Negative for cough.   Cardiovascular: Negative.  Negative for chest pain.  Gastrointestinal: Negative.   Genitourinary: Negative.   Musculoskeletal: Negative.   Skin: Negative.  Negative for rash.  Neurological: Negative.  Negative for dizziness and  headaches.  Psychiatric/Behavioral:  Positive for depression and suicidal ideas. Negative for hallucinations, memory  loss and substance abuse. The patient is nervous/anxious. The patient does not have insomnia.    Blood pressure 111/74, pulse 83, temperature 98.5 F (36.9 C), temperature source Oral, resp. rate 18, height 5\' 5"  (1.651 m), weight 91.6 kg, SpO2 97 %. Body mass index is 33.61 kg/m.  Treatment Plan Summary: Daily contact with patient to assess and evaluate symptoms and progress in treatment and Medication management   Observation Level/Precautions:  15 minute checks  Laboratory:  Labs reviewed   Psychotherapy:  Unit Group sessions  Medications:  See [facility]  Consultations:  To be determined   Discharge Concerns:  Safety, medication compliance, mood stability  Estimated LOS: 5-7 days  Other:  N/A    Labs Reviewed independently on 08/28/2022:  CMP WNL, Lipid panel reviewed (cholesterol 202, LDL-121, Triglycerides 154-educated on healthy food choices and exercise. HA1C is 5.8 rendering pt a prediabetic-will need PCP f/u after dc. TSH WNL. UA hazy with rare bacteria otherwise WNL. EKG from 12/04 with QTC-445. Repeat EKG from 12/21 with Qtc of 446. SARS repeated on 08/30/22: negative.   PLAN Safety and Monitoring: Voluntary admission to inpatient psychiatric unit for safety, stabilization and treatment Daily contact with patient to assess and evaluate symptoms and progress in treatment Patient's case to be discussed in multi-disciplinary team meeting Observation Level : q15 minute checks Vital signs: q12 hours Precautions: Safety   Long Term Goal(s): Improvement in symptoms so as ready for discharge   Short Term Goals: Ability to identify changes in lifestyle to reduce recurrence of condition will improve, Ability to verbalize feelings will improve, Ability to disclose and discuss suicidal ideas, Ability to demonstrate self-control will improve, Ability to identify and develop  effective coping behaviors will improve, and Compliance with prescribed medications will improve   Diagnoses:  Active Problems:   Asthma   Bipolar I disorder, most recent episode depressed, severe without psychotic features (HCC)   Anxiety   Insomnia   Essential hypertension    Recent medication trials:  - Seroquel 300 mg nightly dc'd lack of efficacy (08/17/22) - Seroquel 100 mg at bedtime PRN (08/17/22) d'cd - Nifedipine completely tapered off on 12/19 (home med) - Given Ativan 2 mg IM x 0ne dose for catatonia on 12/06 with good response - Prozac 60 mg d/t lack of efficacy (08/27/22) - Zyprexa 20 mg nightly due to lack of efficacy (08/27/22)  Medications: MDD/mood stabilization -Continue: - Seroquel XR 100 mg nightly for  sleep/mood - Anafranil 25 mg daily in am for  depressive symptoms/OCD  (Educated to abstain from  Tyramine containing foods due to  dangerous interactions with this  class of medications). - Anafranil 50 mg q. nightly daily  for depressive symptoms/OCD  Educated to abstain from  Tyramine containing foods due to dangerous interactions with this  class of medications) 08/29/22. - Topamax to 50 mg BID for OCD - Lamictal 200 mg daily for mood  stabilization (home med)  Catatonia & OCD -Continue: - Ativan to 1 mg TID for catatonia - Namenda to 10 mg BID for OCD Consulted with Dr. 08/31/22 with proposed plan to taper off on Topamax and Namenda for OCD  with outpatient provider after discharge due to reduced effectiveness on long-term use.  Anxiety -Continue:   - Inderal LA 60 mg daily for anxiety/tachycardia (home med)  Insomnia -Continue:  - Trazodone 50 mg HS: insomnia  Other PRNS -Continue:  - Hydroxyzine 25 mg every 6 hours PRN - Albuterol PRN Q 4 H for wheezing/SOBTylenol 650 mg  every 6 hours PRN for mild pain - Maalox 30 mg every 4 hrs PRN for indigestion - Milk of Magnesia as needed every 6 hrs for constipation - Trazodone 50 mg nightly    50 mg PRN for insomnia   Discharge Planning: Social work and case management to assist with discharge planning and identification of hospital follow-up needs prior to discharge Estimated LOS: 5-7 days Discharge Concerns: Need to establish a safety plan; Medication compliance and effectiveness Discharge Goals: Return home with outpatient referrals for mental health follow-up including medication management/psychotherapy   Loletta Parish, NP 09/01/2022, 10:27 AMPatient ID: Barnet Pall, female   DOB: 12-Feb-1986, 36 y.o.   MRN: [id number]

## 2022-09-02 DIAGNOSIS — F3160 Bipolar disorder, current episode mixed, unspecified: Secondary | ICD-10-CM | POA: Diagnosis not present

## 2022-09-02 LAB — LAMOTRIGINE LEVEL: Lamotrigine Lvl: 3.8 ug/mL (ref 2.0–20.0)

## 2022-09-02 MED ORDER — SODIUM CHLORIDE 0.9 % IN NEBU
INHALATION_SOLUTION | RESPIRATORY_TRACT | Status: AC
  Filled 2022-09-02: qty 9

## 2022-09-02 NOTE — Plan of Care (Signed)
  Problem: Education: Goal: Emotional status will improve Outcome: Progressing Goal: Mental status will improve Outcome: Progressing   Problem: Activity: Goal: Interest or engagement in activities will improve Outcome: Progressing Goal: Sleeping patterns will improve Outcome: Progressing   Problem: Coping: Goal: Ability to demonstrate self-control will improve Outcome: Progressing   Problem: Health Behavior/Discharge Planning: Goal: Compliance with treatment plan for underlying cause of condition will improve Outcome: Progressing   

## 2022-09-02 NOTE — Progress Notes (Signed)
     09/02/22 2140  Psych Admission Type (Psych Patients Only)  Admission Status Voluntary  Psychosocial Assessment  Patient Complaints None  Eye Contact Fair  Facial Expression Flat  Affect Appropriate to circumstance  Speech Logical/coherent  Interaction Assertive  Motor Activity Slow  Appearance/Hygiene Unremarkable  Behavior Characteristics Cooperative;Appropriate to situation  Mood Depressed  Thought Process  Coherency WDL  Content WDL  Delusions None reported or observed  Perception WDL  Hallucination None reported or observed  Judgment Impaired  Confusion None  Danger to Self  Current suicidal ideation? Denies  Agreement Not to Harm Self Yes  Description of Agreement verbal  Danger to Others  Danger to Others None reported or observed  Danger to Others Abnormal  Harmful Behavior to others No threats or harm toward other people  Destructive Behavior No threats or harm toward property

## 2022-09-02 NOTE — Progress Notes (Addendum)
D: Pt denied SI/HI/AVH this morning. Pt rates her anxiety a 3/10 and her depression a 3/10. Pt has been pleasant, calm, and cooperative throughout the shift.   A: RN provided support and encouragement to patient. Pt given scheduled medications as prescribed. Wound care completed as ordered. Q15 min checks verified for safety.    R: Patient verbally contracts for safety. Patient compliant with medications and treatment plan. Patient is interacting well on the unit. Pt is safe on the unit.   09/02/22 0957  Psych Admission Type (Psych Patients Only)  Admission Status Voluntary  Psychosocial Assessment  Patient Complaints None  Eye Contact Fair  Facial Expression Flat  Affect Appropriate to circumstance  Speech Logical/coherent  Interaction Assertive  Motor Activity Slow  Appearance/Hygiene Unremarkable  Behavior Characteristics Cooperative;Appropriate to situation  Mood Depressed  Thought Process  Coherency WDL  Content WDL  Delusions None reported or observed  Perception WDL  Hallucination None reported or observed  Judgment Impaired  Confusion None  Danger to Self  Current suicidal ideation? Denies  Self-Injurious Behavior No self-injurious ideation or behavior indicators observed or expressed   Agreement Not to Harm Self Yes  Description of Agreement Pt verbally contracts for safety  Danger to Others  Danger to Others None reported or observed  Danger to Others Abnormal  Harmful Behavior to others No threats or harm toward other people  Destructive Behavior No threats or harm toward property

## 2022-09-02 NOTE — Progress Notes (Signed)
RN completed wound care as ordered for patient. No signs of infection observed. Wound appears to be healing well. RN cleansed wound with normal saline, packed wound with Aquacel dressing, and secured with two large bandages

## 2022-09-02 NOTE — Progress Notes (Signed)
   09/02/22 0545  15 Minute Checks  Location Bedroom  Visual Appearance Calm  Behavior Sleeping  Sleep (Behavioral Health Patients Only)  Calculate sleep? (Click Yes once per 24 hr at 0600 safety check) Yes  Documented sleep last 24 hours 10.75

## 2022-09-02 NOTE — Group Note (Signed)
Recreation Therapy Group Note   Group Topic:Team Building  Group Date: 09/02/2022 Start Time: 0930 End Time: 0950 Facilitators: [employer]Mc[employer], LRT,CTRS Location: [address] Dayroom   Goal Area(s) Addresses:  Patient will effectively work with peer towards shared goal.  Patient will identify skills used to make activity successful.  Patient will share challenges and verbalize solution-driven approaches used. Patient will identify how skills used during activity can be used to reach post d/c goals.   Group Description: [employer]. Patients were provided the following materials: 5 drinking straws, 5 rubber bands, 5 paper clips, 2 index cards and 2 drinking cups. Using the provided materials patients were asked to build a launching mechanism to launch a ping pong ball across the room, approximately 10 feet. Patients were divided into teams of 3-5. Instructions required all materials be incorporated into the device, functionality of items left to the peer group's discretion.   Affect/Mood: N/A   Participation Level: Did not attend    Clinical Observations/Individualized Feedback:     Plan: Continue to engage patient in RT group sessions 2-3x/week.   Ikeem Cleckler-McCall, LRT,CTRS 09/02/2022 12:24 PM

## 2022-09-02 NOTE — Group Note (Signed)
Date:  09/02/2022 Time:  9:42 AM  Group Topic/Focus:  Goals Group:   The focus of this group is to help patients establish daily goals to achieve during treatment and discuss how the patient can incorporate goal setting into their daily lives to aide in recovery. Orientation:   The focus of this group is to educate the patient on the purpose and policies of crisis stabilization and provide a format to answer questions about their admission.  The group details unit policies and expectations of patients while admitted.    Participation Level:  Did Not Attend  Participation Quality:    Affect:   Cognitive:    Insight:   Engagement in Group:   Modes of Intervention:    Additional Comments:    Jaquita Rector 09/02/2022, 9:42 AM

## 2022-09-02 NOTE — Group Note (Signed)
Date:  09/02/2022 Time:  2:40 PM  Group Topic/Focus:  Self Care:   The focus of this group is to help patients understand the importance of self-care in order to improve or restore emotional, physical, spiritual, interpersonal, and financial health.    Participation Level:  Active  Participation Quality:  Appropriate  Affect:  Appropriate  Cognitive:  Appropriate  Insight: Appropriate  Engagement in Group:  Engaged  Modes of Intervention:  Discussion  Additional Comments:  Pt went over Self-Encouragement handouts.  Jaquita Rector 09/02/2022, 2:40 PM

## 2022-09-02 NOTE — Progress Notes (Signed)
[facility] MD Progress Note  09/02/2022 1:38 PM Kara PallKristen B Stephens  MRN:  [id number] Principal Problem: Bipolar 1 disorder, mixed (HCC) Diagnosis: Active Problems:   Asthma   Bipolar I disorder, most recent episode depressed, severe without psychotic features (HCC)   Anxiety   Insomnia   Essential hypertension   COVID-19  Reason For Admission: Kara Stephens is a 36 yo Caucasian female with a prior mental health history of bipolar d/o who [place] [facility] [place] [facility] accompanied by her husband on 12/4 with complaints of worsening depressive symptoms with a plan to jump off their 2nd storey apartment building & intrusive thoughts to harm her 674 month old baby. [facility] and stabilization of her mood.    24 hr chart review: V/S for the past 24 hrs have been WNL. Pt has been compliant with all scheduled medications. She last required Hydroxyzine 25 mg for anxiety last night as well as Trazodone 50 mg for sleep. She slept a total of 10.75 hrs last night as per unit flow sheets. Pt has attended some unit group sessions within the past 24 hrs, and has been engaged and interactive.   Today's assessment: Pt's mood is significantly less depressed today than last week. She is observed to be smiling, attention to personal hygiene and grooming is good, eye contact is good, speech is clear & coherent. Thought contents are organized and logical, and pt currently denies SI/HI/AVH or paranoia. There is no evidence of delusional thoughts.  She reports that the obsessions thoughts of wanting to throw her 654 month old baby over the, balcony of her 2nd floor apartment have resolved.   Pt reports a good appetite, reports a good sleep quality last night, denies any medication related side effects. Writer spoke with Kara Stephens's husband. He reports that he has also noticed an improvement in Kara Stephens's mood and states she has also been  denying having the intrusive thoughts. He states that he continues to be worried about leaving her alone with the baby after discharge. I recommended supervision at least for a few weeks after discharge. He states that he will speak with his daughter and also Kara Stephens's mother because he wants Kara HireKristen to go reside with her mother for sometime after discharge. I educated on current medications, and also told him that plan is to discharge her on Friday since we are allowing tomorrow for them to figure things out in their family regarding post discharge care. We are continuing all medications as listed below.   Total Time spent with patient: 45 minutes  Past Psychiatric History: Bipolar d/o  Past Medical History:  Past Medical History:  Diagnosis Date   Anxiety    Back spasm    Bipolar I disorder (HCC)    Hypertension    Tachycardia     Past Surgical History:  Procedure Laterality Date   APPENDECTOMY     CHOLECYSTECTOMY     HAND SURGERY     SHOULDER SURGERY     Family History:  Family History  Problem Relation Age of Onset   Hypertension Mother    Hyperlipidemia Mother    Suicidality Father    Depression Father    Family Psychiatric  History: See above Social History:  Social History   Substance and Sexual Activity  Alcohol Use No     Social History   Substance and Sexual Activity  Drug Use No    Social History  Socioeconomic History   Marital status: Married    Spouse name: Not on file   Number of children: Not on file   Years of education: Not on file   Highest education level: Not on file  Occupational History   Not on file  Tobacco Use   Smoking status: Former    Types: Cigarettes    Quit date: 09/10/2016    Years since quitting: 5.9   Smokeless tobacco: Never  Vaping Use   Vaping Use: Never used  Substance and Sexual Activity   Alcohol use: No   Drug use: No   Sexual activity: Not on file  Other Topics Concern   Not on file  Social History Narrative    Not on file   Social Determinants of Health   Financial Resource Strain: Not on file  Food Insecurity: No Food Insecurity (08/11/2022)   Hunger Vital Sign    Worried About Running Out of Food in the Last Year: Never true    Ran Out of Food in the Last Year: Never true  Transportation Needs: No Transportation Needs (08/11/2022)   PRAPARE - Administrator, Civil Service (Medical): No    Lack of Transportation (Non-Medical): No  Physical Activity: Not on file  Stress: Not on file  Social Connections: Not on file   Additional Social History:   Sleep: Poor  Appetite:  Fair  Current Medications: Current Facility-Administered Medications  Medication Dose Route Frequency Provider Last Rate Last Admin   acetaminophen (TYLENOL) tablet 650 mg  650 mg Oral Q6H PRN [name], MD   650 mg at 08/21/22 0935   albuterol (VENTOLIN HFA) 108 (90 Base) MCG/ACT inhaler 2 puff  2 puff Inhalation Q4H PRN [name], MD       alum & mag hydroxide-simeth (MAALOX/MYLANTA) 200-200-20 MG/5ML suspension 30 mL  30 mL Oral Q4H PRN [name], MD   30 mL at 08/29/22 0559   clomiPRAMINE (ANAFRANIL) capsule 25 mg  25 mg Oral Daily Ntuen, Tina C, FNP   25 mg at 09/02/22 0746   clomiPRAMINE (ANAFRANIL) capsule 50 mg  50 mg Oral QHS Ntuen, Tina C, FNP   50 mg at 09/01/22 2031   docusate sodium (COLACE) capsule 100 mg  100 mg Oral BID Massengill, [name], MD   100 mg at 09/02/22 0746   hydrOXYzine (ATARAX) tablet 25 mg  25 mg Oral TID PRN [name], MD   25 mg at 09/01/22 2031   lamoTRIgine (LAMICTAL) tablet 200 mg  200 mg Oral Daily Massengill, [name], MD   200 mg at 09/02/22 0746   LORazepam (ATIVAN) tablet 1 mg  1 mg Oral Q8H Massengill, Nathan, MD   1 mg at 09/02/22 1326   magnesium hydroxide (MILK OF MAGNESIA) suspension 30 mL  30 mL Oral Daily PRN [name], MD       melatonin tablet 5 mg  5 mg Oral QHS Massengill, [name], MD   5 mg at 09/01/22 2031   memantine (NAMENDA) tablet 10 mg  10 mg Oral  Q12H Massengill, [name], MD   10 mg at 09/02/22 0746   metFORMIN (GLUCOPHAGE-XR) 24 hr tablet 500 mg  500 mg Oral Q breakfast Massengill, [name], MD   500 mg at 09/02/22 0746   polyethylene glycol (MIRALAX / GLYCOLAX) packet 17 g  17 g Oral Daily Massengill, [name], MD   17 g at 09/02/22 0747   propranolol ER (INDERAL LA) 24 hr capsule 60 mg  60 mg Oral  Daily Massengill, Nathan, MD   60 mg at 09/02/22 0746   QUEtiapine (SEROQUEL XR) 24 hr tablet 100 mg  100 mg Oral QHS Massengill, Nathan, MD   100 mg at 09/01/22 2031   topiramate (TOPAMAX) tablet 50 mg  50 mg Oral Q12H Massengill, [name], MD   50 mg at 09/02/22 0746   traZODone (DESYREL) tablet 50 mg  50 mg Oral QHS PRN [name], MD   50 mg at 09/01/22 2031   Lab Results:  No results found for this or any previous visit (from the past 48 hour(s)).   Blood Alcohol level:  Lab Results  Component Value Date   ETH <10 08/10/2022   Metabolic Disorder Labs: Lab Results  Component Value Date   HGBA1C 5.8 (H) 08/10/2022   MPG 120 08/10/2022   No results found for: "PROLACTIN" Lab Results  Component Value Date   CHOL 202 (H) 08/12/2022   TRIG 154 (H) 08/12/2022   HDL 50 08/12/2022   CHOLHDL 4.0 08/12/2022   VLDL 31 08/12/2022   LDLCALC 121 (H) 08/12/2022   Physical Findings: AIMS: 0 CIWA: n/a COWS:n/a  Musculoskeletal: Strength & Muscle Tone: within normal limits Gait & Station: normal Patient leans: N/A  Psychiatric Specialty Exam:  Presentation  General Appearance:  Appropriate for Environment  Eye Contact: Good  Speech: Clear and Coherent  Speech Volume: Normal  Handedness: Right  Mood and Affect  Mood: Euthymic  Affect: Congruent  Thought Process  Thought Processes: Coherent  Descriptions of Associations:Intact  Orientation:Full (Time, Place and Person)  Thought Content:Logical  History of Schizophrenia/Schizoaffective disorder:No  Duration of Psychotic Symptoms:No data  recorded Hallucinations:Hallucinations: None  Ideas of Reference:None  Suicidal Thoughts:Suicidal Thoughts: No  Homicidal Thoughts:Homicidal Thoughts: No   Sensorium  Memory: Immediate Good  Judgment: Fair  Insight: Fair  Art therapist  Concentration: Fair  Attention Span: Fair  Recall: Fair  Fund of Knowledge: Fair  Language: Fair  Psychomotor Activity  Psychomotor Activity: Psychomotor Activity: Psychomotor Retardation  Assets  Assets: Communication Skills  Sleep  Sleep: Sleep: Good  Physical Exam: Physical Exam Vitals and nursing note reviewed.  Constitutional:      Appearance: Normal appearance.  HENT:     Head: Normocephalic.     Nose: Nose normal. No congestion or rhinorrhea.  Eyes:     Pupils: Pupils are equal, round, and reactive to light.  Cardiovascular:     Rate and Rhythm: Normal rate.     Pulses: Normal pulses.  Pulmonary:     Effort: Pulmonary effort is normal.  Genitourinary:    Comments: Deferred Musculoskeletal:        General: Normal range of motion.     Cervical back: Normal range of motion.  Skin:    General: Skin is warm.  Neurological:     Mental Status: She is alert and oriented to person, place, and time.  Psychiatric:        Mood and Affect: Mood normal.        Behavior: Behavior normal.    Review of Systems  Constitutional:  Negative for chills and fever.  HENT: Negative.  Negative for hearing loss.   Eyes: Negative.  Negative for blurred vision.  Respiratory: Negative.  Negative for cough.   Cardiovascular: Negative.  Negative for chest pain.  Gastrointestinal: Negative.   Genitourinary: Negative.   Musculoskeletal: Negative.   Skin: Negative.  Negative for rash.  Neurological: Negative.  Negative for dizziness and headaches.  Psychiatric/Behavioral:  Positive for depression. Negative  for hallucinations, memory loss, substance abuse and suicidal ideas. The patient is not nervous/anxious and does  not have insomnia.    Blood pressure 109/68, pulse 81, temperature 99.1 F (37.3 C), temperature source Oral, resp. rate 20, height  (1.651 m), weight 91.6 kg, SpO2 97 %. Body mass index is 33.61 kg/m.  Treatment Plan Summary: Daily contact with patient to assess and evaluate symptoms and progress in treatment and Medication management   Observation Level/Precautions:  15 minute checks  Laboratory:  Labs reviewed   Psychotherapy:  Unit Group sessions  Medications:  See [facility]  Consultations:  To be determined   Discharge Concerns:  Safety, medication compliance, mood stability  Estimated LOS: 5-7 days  Other:  N/A    Labs Reviewed independently on 08/24/2022:  CMP WNL, Lipid panel reviewed (cholesterol 202, LDL-121, Triglycerides 154-educated on healthy food choices and exercise. HA1C is 5.8 rendering pt a prediabetic-will need PCP f/u after dc. TSH WNL. UA hazy with rare bacteria otherwise WNL. EKG from 12/04 with QTC-445.    PLAN Safety and Monitoring: Voluntary admission to inpatient psychiatric unit for safety, stabilization and treatment Daily contact with patient to assess and evaluate symptoms and progress in treatment Patient's case to be discussed in multi-disciplinary team meeting Observation Level : q15 minute checks Vital signs: q12 hours Precautions: Safety   Long Term Goal(s): Improvement in symptoms so as ready for discharge   Short Term Goals: Ability to identify changes in lifestyle to reduce recurrence of condition will improve, Ability to verbalize feelings will improve, Ability to disclose and discuss suicidal ideas, Ability to demonstrate self-control will improve, Ability to identify and develop effective coping behaviors will improve, and Compliance with prescribed medications will improve   Diagnoses:  Active Problems:   Asthma   Bipolar I disorder, most recent episode depressed, severe without psychotic features (HCC)   Anxiety   Insomnia   Essential  hypertension   Medications MDD/mood stabilization -Continue Seroquel XR 100 mg nightly for sleep/mood -Continue  Anafranil 25 mg daily in am for depressive symptoms/OCD -Continue Anafranil 50 mg q. nightly for depressive symptoms/OCD  -Continue Topamax 50 mg BID for OCD -Continue Lamictal 200 mg daily for mood stabilization (home med) -Continue Inderal LA 60 mg daily for anxiety/tachycardia (home med)  Catatonia & OCD -Continue Ativan to 1 mg TID for catatonia -Continue Namenda 10 mg BID for OCD   Insomnia -Continue Trazodone 50 mg nightly and 50 mg PRN for insomnia -Continue Inderal LA 60 mg daily for anxiety/tachycardia (home med)   Other PRNS -Continue Hydroxyzine 25 mg every 6 hours PRN -Continue Albuterol PRN Q 4 H for wheezing/SOB -Continue Tylenol 650 mg every 6 hours PRN for mild pain -Continue Maalox 30 mg every 4 hrs PRN for indigestion -Continue Milk of Magnesia as needed every 6 hrs for constipation  Medications discontinued since admission -Discontinue Seroquel 300 mg nightly d/t lack of efficacy (08/17/22) -Discontinue Seroquel 100 mg at bedtime PRN (08/17/22) -Nifedipine completely tapered off on 12/19 (home med) -Given Ativan 2 mg IM x 0ne dose for catatonia on 12/06 with good response  -Discontinued Prozac 60 mg d/t lack of efficacy (08/27/22) -Discontinued Zyprexa 20 mg nightly due to lack of efficacy (08/27/22)  Discharge Planning: Social work and case management to assist with discharge planning and identification of hospital follow-up needs prior to discharge Estimated LOS: 5-7 days Discharge Concerns: Need to establish a safety plan; Medication compliance and effectiveness Discharge Goals: Return home with outpatient referrals for mental  health follow-up including medication management/psychotherapy   Starleen Blue, NP 09/02/2022, 1:38 PMPatient ID: Kara Stephens, female   DOB: 01-31-1986, 68 y.o.   MRN: [id number] Patient ID: Kara Stephens, Patient  ID: Kara Stephens, female   Patient ID: Kara Stephens, female   DOB: 10/26/1985, 40 y.o.   MRN: [id number]

## 2022-09-02 NOTE — Progress Notes (Signed)
D- Patient alert and oriented. . Denies SI, HI, AVH, pain, and intrusive thoughts. Patient seen smiling, but facial expression mostly flat. Patient rates anxiety and depression as a 3/10 each.   A- Scheduled medications administered to patient along with PRN trazodone and hydroxyzine, per MAR. Support and encouragement provided.  Routine safety checks conducted every 15 minutes.  Patient informed to notify staff with problems or concerns.  R- No adverse drug reactions noted. Patient contracts for safety at this time. Patient compliant with medications and treatment plan. Patient receptive, calm, and cooperative. Patient interacts well with others on the unit.  Patient remains safe at this time.

## 2022-09-02 NOTE — [facility] Group Notes (Signed)
[facility] Group Notes:  (Nursing/MHT/Case Management/Adjunct)  Date:  09/02/2022  Time:  12:28 AM  Type of Therapy:   wrap up group  Participation Level:  Active  Participation Quality:  Appropriate  Affect:  Appropriate  Cognitive:  Alert and Appropriate  Insight:  Appropriate and Good  Engagement in Group:  Engaged  Modes of Intervention:  Discussion  Summary of Progress/Problems: The pt. Shared with the group how she rates her day with a 8.  She was excited to continue and share how she had a visit from her mom.   Kara Stephens [place] 09/02/2022, 12:28 AM

## 2022-09-03 DIAGNOSIS — F3160 Bipolar disorder, current episode mixed, unspecified: Secondary | ICD-10-CM | POA: Diagnosis not present

## 2022-09-03 NOTE — Group Note (Signed)
Date:  09/03/2022 Time:  10:28 AM  Group Topic/Focus:  Goals Group:   The focus of this group is to help patients establish daily goals to achieve during treatment and discuss how the patient can incorporate goal setting into their daily lives to aide in recovery.    Participation Level:  Did Not Attend  Participation Quality:      Affect:      Cognitive:      Insight: None  Engagement in Group:  Defensive and    Modes of Intervention:      Additional Comments:     Reymundo Poll 09/03/2022, 10:28 AM

## 2022-09-03 NOTE — Progress Notes (Signed)
     09/03/22 2116  Psych Admission Type (Psych Patients Only)  Admission Status Voluntary  Psychosocial Assessment  Patient Complaints None  Eye Contact Fair  Facial Expression Flat  Affect Appropriate to circumstance  Speech Logical/coherent  Interaction Assertive  Motor Activity Slow  Appearance/Hygiene Unremarkable  Behavior Characteristics Cooperative;Appropriate to situation  Mood Depressed  Thought Process  Coherency WDL  Content WDL  Delusions None reported or observed  Perception WDL  Hallucination None reported or observed  Judgment Impaired  Confusion None  Danger to Self  Current suicidal ideation? Denies  Self-Injurious Behavior No self-injurious ideation or behavior indicators observed or expressed   Agreement Not to Harm Self Yes  Description of Agreement verbal  Danger to Others  Danger to Others None reported or observed  Danger to Others Abnormal  Harmful Behavior to others No threats or harm toward other people  Destructive Behavior No threats or harm toward property

## 2022-09-03 NOTE — Progress Notes (Addendum)
D: Pt denied SI/HI/AVH this morning. Pt rated her depression a 3/10, anxiety a 3/10, and feelings of hopelessness a 3/10. Pt has been pleasant, calm, and cooperative throughout the shift.   A: RN provided support and encouragement to patient. Pt given scheduled medications as prescribed. Pt refused wound care today. Q15 min checks verified for safety.    R: Patient verbally contracts for safety. Patient compliant with medications and treatment plan. Patient is interacting well on the unit. Pt is safe on the unit.   09/03/22 1015  Psych Admission Type (Psych Patients Only)  Admission Status Voluntary  Psychosocial Assessment  Patient Complaints None  Eye Contact Fair  Facial Expression Flat  Affect Appropriate to circumstance  Speech Logical/coherent  Interaction Assertive  Motor Activity Slow  Appearance/Hygiene Unremarkable  Behavior Characteristics Cooperative;Appropriate to situation  Mood Depressed  Thought Process  Coherency WDL  Content WDL  Delusions None reported or observed  Perception WDL  Hallucination None reported or observed  Judgment Impaired  Confusion None  Danger to Self  Current suicidal ideation? Denies  Self-Injurious Behavior No self-injurious ideation or behavior indicators observed or expressed   Agreement Not to Harm Self Yes  Description of Agreement Pt verbally contracts for safety  Danger to Others  Danger to Others None reported or observed  Danger to Others Abnormal  Harmful Behavior to others No threats or harm toward other people  Destructive Behavior No threats or harm toward property

## 2022-09-03 NOTE — Plan of Care (Signed)
  Problem: Education: Goal: Emotional status will improve Outcome: Progressing Goal: Mental status will improve Outcome: Progressing   Problem: Activity: Goal: Interest or engagement in activities will improve Outcome: Progressing Goal: Sleeping patterns will improve Outcome: Progressing   Problem: Coping: Goal: Ability to demonstrate self-control will improve Outcome: Progressing   Problem: Health Behavior/Discharge Planning: Goal: Compliance with treatment plan for underlying cause of condition will improve Outcome: Progressing   

## 2022-09-03 NOTE — Progress Notes (Signed)
[facility] MD Progress Note  09/03/2022 7:26 PM Kara Stephens  MRN:  [id number] Principal Problem: Bipolar 1 disorder, mixed ([place]) Diagnosis: Active Problems:   Asthma   Bipolar I disorder, most recent episode depressed, severe without psychotic features ([place])   Anxiety   Insomnia   Essential hypertension   COVID-19  Reason For Admission: Kara Stephens is a 36 yo Caucasian female with a prior mental health history of bipolar d/o who presented to the [employer] health urgent care [facility]) accompanied by her husband on 12/4 with complaints of worsening depressive symptoms with a plan to jump off their 2nd storey apartment building & intrusive thoughts to harm her 34 month old baby. Pt was transferred voluntarily to this [facility] for treatment and stabilization of her mood.    24 hr chart review: V/S for the past 24 hrs have been WNL. Pt has been compliant with all scheduled medications. No prn medication use overnight. Pt has attended some unit group sessions within the past 24 hrs, and has been engaged and interactive.   Today's assessment: The patient reported significant improved. She continues to deny obsessive thoughts regarding harming her baby. Her mood is good. Her affect is brighter, she was observed smiling. She is excited about going back home. Her sleep and appetite are fine.  She denies SO, Hi and AVH. Stated she talked to her husband and they are excited about her return to home.    Total Time spent with patient: 25 minutes  Past Psychiatric History: Bipolar d/o  Past Medical History:  Past Medical History:  Diagnosis Date   Anxiety    Back spasm    Bipolar I disorder ([place])    Hypertension    Tachycardia     Past Surgical History:  Procedure Laterality Date   APPENDECTOMY     CHOLECYSTECTOMY     HAND SURGERY     SHOULDER SURGERY     Family History:  Family History  Problem Relation Age of Onset   Hypertension Mother    Hyperlipidemia Mother     Suicidality Father    Depression Father    Family Psychiatric  History: See above Social History:  Social History   Substance and Sexual Activity  Alcohol Use No     Social History   Substance and Sexual Activity  Drug Use No    Social History   Socioeconomic History   Marital status: Married    Spouse name: Not on file   Number of children: Not on file   Years of education: Not on file   Highest education level: Not on file  Occupational History   Not on file  Tobacco Use   Smoking status: Former    Types: Cigarettes    Quit date: 09/10/2016    Years since quitting: 5.9   Smokeless tobacco: Never  Vaping Use   Vaping Use: Never used  Substance and Sexual Activity   Alcohol use: No   Drug use: No   Sexual activity: Not on file  Other Topics Concern   Not on file  Social History Narrative   Not on file   Social Determinants of Health   Financial Resource Strain: Not on file  Food Insecurity: No Food Insecurity (08/11/2022)   Hunger Vital Sign    Worried About Running Out of Food in the Last Year: Never true    Ran Out of Food in the Last Year: Never true  Transportation Needs: No Transportation Needs (08/11/2022)  PRAPARE - Hydrologist (Medical): No    Lack of Transportation (Non-Medical): No  Physical Activity: Not on file  Stress: Not on file  Social Connections: Not on file   Additional Social History:   Sleep: Good  Appetite:  Good  Current Medications: Current Facility-Administered Medications  Medication Dose Route Frequency Provider Last Rate Last Admin   acetaminophen (TYLENOL) tablet 650 mg  650 mg Oral Q6H PRN France Ravens, MD   650 mg at 08/21/22 0935   albuterol (VENTOLIN HFA) 108 (90 Base) MCG/ACT inhaler 2 puff  2 puff Inhalation Q4H PRN France Ravens, MD       alum & mag hydroxide-simeth (MAALOX/MYLANTA) 200-200-20 MG/5ML suspension 30 mL  30 mL Oral Q4H PRN France Ravens, MD   30 mL at 08/29/22 0559   clomiPRAMINE  (ANAFRANIL) capsule 25 mg  25 mg Oral Daily Ntuen, Kris Hartmann, FNP   25 mg at 09/03/22 0748   clomiPRAMINE (ANAFRANIL) capsule 50 mg  50 mg Oral QHS Ntuen, Tina C, FNP   50 mg at 09/02/22 2114   docusate sodium (COLACE) capsule 100 mg  100 mg Oral BID Massengill, Ovid Curd, MD   100 mg at 09/03/22 1646   hydrOXYzine (ATARAX) tablet 25 mg  25 mg Oral TID PRN France Ravens, MD   25 mg at 09/01/22 2031   lamoTRIgine (LAMICTAL) tablet 200 mg  200 mg Oral Daily Massengill, Ovid Curd, MD   200 mg at 09/03/22 0748   LORazepam (ATIVAN) tablet 1 mg  1 mg Oral Q8H Massengill, Nathan, MD   1 mg at 09/03/22 1307   magnesium hydroxide (MILK OF MAGNESIA) suspension 30 mL  30 mL Oral Daily PRN France Ravens, MD       melatonin tablet 5 mg  5 mg Oral QHS Massengill, Ovid Curd, MD   5 mg at 09/02/22 2115   memantine (NAMENDA) tablet 10 mg  10 mg Oral Q12H Massengill, Ovid Curd, MD   10 mg at 09/03/22 0749   metFORMIN (GLUCOPHAGE-XR) 24 hr tablet 500 mg  500 mg Oral Q breakfast Massengill, Ovid Curd, MD   500 mg at 09/03/22 0749   polyethylene glycol (MIRALAX / GLYCOLAX) packet 17 g  17 g Oral Daily Massengill, Ovid Curd, MD   17 g at 09/03/22 0749   propranolol ER (INDERAL LA) 24 hr capsule 60 mg  60 mg Oral Daily Massengill, Ovid Curd, MD   60 mg at 09/03/22 0748   QUEtiapine (SEROQUEL XR) 24 hr tablet 100 mg  100 mg Oral QHS Massengill, Ovid Curd, MD   100 mg at 09/02/22 2114   topiramate (TOPAMAX) tablet 50 mg  50 mg Oral Q12H Massengill, Ovid Curd, MD   50 mg at 09/03/22 0749   traZODone (DESYREL) tablet 50 mg  50 mg Oral QHS PRN Janine Limbo, MD   50 mg at 09/02/22 2114   Lab Results:  No results found for this or any previous visit (from the past 48 hour(s)).   Blood Alcohol level:  Lab Results  Component Value Date   ETH <10 [id number]   Metabolic Disorder Labs: Lab Results  Component Value Date   HGBA1C 5.8 (H) 08/10/2022   MPG 120 08/10/2022   No results found for: "PROLACTIN" Lab Results  Component Value Date   CHOL 202  (H) 08/12/2022   TRIG 154 (H) 08/12/2022   HDL 50 08/12/2022   CHOLHDL 4.0 08/12/2022   VLDL 31 08/12/2022   LDLCALC 121 (H) 08/12/2022   Physical Findings: AIMS:  0 CIWA: n/a COWS:n/a  Musculoskeletal: Strength & Muscle Tone: within normal limits Gait & Station: normal Patient leans: N/A  Psychiatric Specialty Exam:  Presentation  General Appearance: Appropriate for Environment Eye Contact:Good  Speech: Clear and Coherent  Speech Volume: Normal  Handedness: Right  Mood and Affect  Mood: Euthymic  Affect: Congruent  Thought Process  Thought Processes: Coherent  Descriptions of Associations:Intact  Orientation:Full (Time, Place and Person)  Thought Content:Logical  History of Schizophrenia/Schizoaffective disorder:No  Duration of Psychotic Symptoms:No data recorded Hallucinations:Hallucinations: None  Ideas of Reference:None  Suicidal Thoughts:Suicidal Thoughts: No  Homicidal Thoughts:Homicidal Thoughts: No   Sensorium  Memory: Immediate Good  Judgment: Fair-Good Insight: Fair-Good  Community education officer  Concentration:Fair Attention Span:Fair [place] of Knowledge: Fair  Language: Fair  Psychomotor Activity  Psychomotor Activity: Normal  Assets  Assets: Communication Skills  Sleep  Sleep: Good  Physical Exam: Physical Exam Vitals and nursing note reviewed.  Constitutional:      Appearance: Normal appearance.  HENT:     Head: Normocephalic.     Nose: Nose normal. No congestion or rhinorrhea.  Eyes:     Pupils: Pupils are equal, round, and reactive to light.  Cardiovascular:     Rate and Rhythm: Normal rate.     Pulses: Normal pulses.  Pulmonary:     Effort: Pulmonary effort is normal.  Genitourinary:    Comments: Deferred Musculoskeletal:        General: Normal range of motion.     Cervical back: Normal range of motion.  Skin:    General: Skin is warm.  Neurological:     Mental Status: She is alert and  oriented to person, place, and time.  Psychiatric:        Mood and Affect: Mood normal.        Behavior: Behavior normal.   Review of Systems  Constitutional:  Negative for chills and fever.  HENT: Negative.  Negative for hearing loss.   Eyes: Negative.  Negative for blurred vision.  Respiratory: Negative.  Negative for cough.   Cardiovascular: Negative.  Negative for chest pain.  Gastrointestinal: Negative.   Genitourinary: Negative.   Musculoskeletal: Negative.   Skin: Negative.  Negative for rash.  Neurological: Negative.  Negative for dizziness and headaches.  Psychiatric/Behavioral:  Negative for hallucinations, memory loss, substance abuse and suicidal ideas. The patient is not nervous/anxious and does not have insomnia.    Blood pressure 117/80, pulse 79, temperature 98.5 F (36.9 C), temperature source Oral, resp. rate 16, height 5\' 5"  (1.651 m), weight 91.6 kg, SpO2 97 %. Body mass index is 33.61 kg/m.  Treatment Plan Summary: Daily contact with patient to assess and evaluate symptoms and progress in treatment and Medication management   Observation Level/Precautions:  15 minute checks  Laboratory:  Labs reviewed   Psychotherapy:  Unit Group sessions  Medications:  See [facility]  Consultations:  To be determined   Discharge Concerns:  Safety, medication compliance, mood stability  Estimated LOS: 5-7 days  Other:  N/A    Labs Reviewed independently on 08/24/2022:  CMP WNL, Lipid panel reviewed (cholesterol 202, LDL-121, Triglycerides 154-educated on healthy food choices and exercise. HA1C is 5.8 rendering pt a prediabetic-will need PCP f/u after dc. TSH WNL. UA hazy with rare bacteria otherwise WNL. EKG from 12/04 with QTC-445.    PLAN Safety and Monitoring: Voluntary admission to inpatient psychiatric unit for safety, stabilization and treatment Daily contact with patient to assess and evaluate symptoms and progress in treatment Patient's  case to be discussed in  multi-disciplinary team meeting Observation Level : q15 minute checks Vital signs: q12 hours Precautions: Safety   Long Term Goal(s): Improvement in symptoms so as ready for discharge   Short Term Goals: Ability to identify changes in lifestyle to reduce recurrence of condition will improve, Ability to verbalize feelings will improve, Ability to disclose and discuss suicidal ideas, Ability to demonstrate self-control will improve, Ability to identify and develop effective coping behaviors will improve, and Compliance with prescribed medications will improve   Diagnoses:  Active Problems:   Asthma   Bipolar I disorder, most recent episode depressed, severe without psychotic features (HCC)   Anxiety   Insomnia   Essential hypertension   Medications MDD/mood stabilization -Continue Seroquel XR 100 mg nightly for sleep/mood -Continue  Anafranil 25 mg daily in am for depressive symptoms/OCD -Continue Anafranil 50 mg q. nightly for depressive symptoms/OCD  -Continue Topamax 50 mg BID for OCD -Continue Lamictal 200 mg daily for mood stabilization (home med) -Continue Inderal LA 60 mg daily for anxiety/tachycardia (home med)  Catatonia & OCD -Continue Ativan to 1 mg TID for catatonia -Continue Namenda 10 mg BID for OCD   Insomnia -Continue Trazodone 50 mg nightly and 50 mg PRN for insomnia -Continue Inderal LA 60 mg daily for anxiety/tachycardia (home med)   Other PRNS -Continue Hydroxyzine 25 mg every 6 hours PRN -Continue Albuterol PRN Q 4 H for wheezing/SOB -Continue Tylenol 650 mg every 6 hours PRN for mild pain -Continue Maalox 30 mg every 4 hrs PRN for indigestion -Continue Milk of Magnesia as needed every 6 hrs for constipation  Medications discontinued since admission -Discontinue Seroquel 300 mg nightly d/t lack of efficacy (08/17/22) -Discontinue Seroquel 100 mg at bedtime PRN (08/17/22) -Nifedipine completely tapered off on 12/19 (home med) -Given Ativan 2 mg IM x 0ne  dose for catatonia on 12/06 with good response  -Discontinued Prozac 60 mg d/t lack of efficacy (08/27/22) -Discontinued Zyprexa 20 mg nightly due to lack of efficacy (08/27/22)  Discharge Planning: Social work and case management to assist with discharge planning and identification of hospital follow-up needs prior to discharge Estimated LOS: 5-7 days Discharge Concerns: Need to establish a safety plan; Medication compliance and effectiveness Discharge Goals: Return home with outpatient referrals for mental health follow-up including medication management/psychotherapy   Antionette Poles, MD 09/03/2022, 7:26 PM

## 2022-09-03 NOTE — Progress Notes (Signed)
  Administered PRN Trazodone per MAR per pt request.  

## 2022-09-03 NOTE — Progress Notes (Signed)
Adult Psychoeducational Group Note  Date:  09/03/2022 Time:  9:31 PM  Group Topic/Focus:  Wrap-Up Group:   The focus of this group is to help patients review their daily goal of treatment and discuss progress on daily workbooks.  Participation Level:  Active  Participation Quality:  Appropriate  Affect:  Appropriate  Cognitive:  Appropriate  Insight: Appropriate  Engagement in Group:  Engaged  Modes of Intervention:  Education and Exploration  Additional Comments:  Patient attended and participated in group tonight. She reports that today she learn never to give up.  Lita Mains [facility] 09/03/2022, 9:31 PM

## 2022-09-03 NOTE — Progress Notes (Addendum)
Pt's husband, Ceclia Koker [phone]) called this RN and stated that both him and his daughters do not think the pt is ready to come home yet and do not want her to be discharged tomorrow. Caller stated he does not have a place to let his son go to keep him away from her at this time. RN notified caller that she would notify the pt's provider.

## 2022-09-04 ENCOUNTER — Encounter (HOSPITAL_COMMUNITY): Payer: Self-pay

## 2022-09-04 DIAGNOSIS — F3160 Bipolar disorder, current episode mixed, unspecified: Secondary | ICD-10-CM | POA: Diagnosis not present

## 2022-09-04 MED ORDER — CLOMIPRAMINE HCL 25 MG PO CAPS: 50.0000 mg | ORAL_CAPSULE | Freq: Every day | ORAL | 0 refills | Status: DC

## 2022-09-04 MED ORDER — MEMANTINE HCL 10 MG PO TABS: 10.0000 mg | ORAL_TABLET | Freq: Two times a day (BID) | ORAL | 0 refills | Status: DC

## 2022-09-04 MED ORDER — LAMOTRIGINE 200 MG PO TABS: 200.0000 mg | ORAL_TABLET | Freq: Every day | ORAL | 0 refills | Status: DC

## 2022-09-04 MED ORDER — MELATONIN 5 MG PO TABS: 5.0000 mg | ORAL_TABLET | Freq: Every day | ORAL | 0 refills | Status: AC

## 2022-09-04 MED ORDER — CLOMIPRAMINE HCL 25 MG PO CAPS: 25.0000 mg | ORAL_CAPSULE | Freq: Every day | ORAL | 0 refills | Status: DC

## 2022-09-04 MED ORDER — METFORMIN HCL ER 500 MG PO TB24: 500.0000 mg | ORAL_TABLET | Freq: Every day | ORAL | 0 refills | Status: DC

## 2022-09-04 MED ORDER — PROPRANOLOL HCL ER 60 MG PO CP24: 60.0000 mg | ORAL_CAPSULE | Freq: Every day | ORAL | 0 refills | Status: DC

## 2022-09-04 MED ORDER — LORAZEPAM 1 MG PO TABS: 1.0000 mg | ORAL_TABLET | Freq: Two times a day (BID) | ORAL | 0 refills | Status: DC

## 2022-09-04 MED ORDER — TOPIRAMATE 50 MG PO TABS: 50.0000 mg | ORAL_TABLET | Freq: Two times a day (BID) | ORAL | 0 refills | Status: DC

## 2022-09-04 MED ORDER — QUETIAPINE FUMARATE ER 50 MG PO TB24: 100.0000 mg | ORAL_TABLET | Freq: Every day | ORAL | 0 refills | Status: DC

## 2022-09-04 NOTE — Group Note (Signed)
Recreation Therapy Group Note   Group Topic:Stress Management  Group Date: 09/04/2022 Start Time: 0932 End Time: 0951 Facilitators: Makhia Vosler-McCall, LRT,CTRS Location: [address] Dayroom   Goal Area(s) Addresses:  Patient will identify positive stress management techniques. Patient will identify benefits of using stress management post d/c.  Group Description:  Meditation.  LRT played a meditation that focused on being grateful for the person in your life who has been supportive or made them feel good in tough times.  Patients were to it back, focus on their breathing and listen to the meditation as it played to engage in the meditation.   Affect/Mood: N/A   Participation Level: Did not attend    Clinical Observations/Individualized Feedback:     Plan: Continue to engage patient in RT group sessions 2-3x/week.   Kara Stephens, LRT,CTRS 09/04/2022 11:56 AM

## 2022-09-04 NOTE — BH IP Treatment Plan (Signed)
Interdisciplinary Treatment and Diagnostic Plan Update  09/04/2022 Time of Session: [address] MRN: [id number]  Principal Diagnosis: Bipolar 1 disorder, mixed (HCC)  Secondary Diagnoses: Active Problems:   Asthma   Bipolar I disorder, most recent episode depressed, severe without psychotic features (HCC)   Anxiety   Insomnia   Essential hypertension   COVID-19   Current Medications:  Current Facility-Administered Medications  Medication Dose Route Frequency Provider Last Rate Last Admin   acetaminophen (TYLENOL) tablet 650 mg  650 mg Oral Q6H PRN [name], MD   650 mg at 08/21/22 0935   albuterol (VENTOLIN HFA) 108 (90 Base) MCG/ACT inhaler 2 puff  2 puff Inhalation Q4H PRN [name], MD       alum & mag hydroxide-simeth (MAALOX/MYLANTA) 200-200-20 MG/5ML suspension 30 mL  30 mL Oral Q4H PRN [name], MD   30 mL at 08/29/22 0559   clomiPRAMINE (ANAFRANIL) capsule 25 mg  25 mg Oral Daily Ntuen, Jesusita Oka, FNP   25 mg at 09/04/22 0750   clomiPRAMINE (ANAFRANIL) capsule 50 mg  50 mg Oral QHS Ntuen, Jesusita Oka, FNP   50 mg at 09/03/22 2116   docusate sodium (COLACE) capsule 100 mg  100 mg Oral BID [name], MD   100 mg at 09/04/22 0750   hydrOXYzine (ATARAX) tablet 25 mg  25 mg Oral TID PRN [name], MD   25 mg at 09/01/22 2031   lamoTRIgine (LAMICTAL) tablet 200 mg  200 mg Oral Daily Massengill, [name], MD   200 mg at 09/04/22 0750   LORazepam (ATIVAN) tablet 1 mg  1 mg Oral Q8H Massengill, Nathan, MD   1 mg at 09/04/22 8101   magnesium hydroxide (MILK OF MAGNESIA) suspension 30 mL  30 mL Oral Daily PRN [name], MD       melatonin tablet 5 mg  5 mg Oral QHS Massengill, [name], MD   5 mg at 09/03/22 2118   memantine (NAMENDA) tablet 10 mg  10 mg Oral Q12H Massengill, [name], MD   10 mg at 09/04/22 0750   metFORMIN (GLUCOPHAGE-XR) 24 hr tablet 500 mg  500 mg Oral Q breakfast Massengill, [name], MD   500 mg at 09/04/22 0750   polyethylene glycol (MIRALAX / GLYCOLAX)  packet 17 g  17 g Oral Daily Massengill, [name], MD   17 g at 09/04/22 0749   propranolol ER (INDERAL LA) 24 hr capsule 60 mg  60 mg Oral Daily Massengill, [name], MD   60 mg at 09/04/22 0750   QUEtiapine (SEROQUEL XR) 24 hr tablet 100 mg  100 mg Oral QHS Massengill, [name], MD   100 mg at 09/03/22 2117   topiramate (TOPAMAX) tablet 50 mg  50 mg Oral Q12H Massengill, [name], MD   50 mg at 09/04/22 0750   traZODone (DESYREL) tablet 50 mg  50 mg Oral QHS PRN [name], MD   50 mg at 09/03/22 2117   PTA Medications: Medications Prior to Admission  Medication Sig Dispense Refill Last Dose   albuterol (VENTOLIN HFA) 108 (90 Base) MCG/ACT inhaler Inhale 2 puffs into the lungs every 4 (four) hours as needed for wheezing or shortness of breath (cough, shortness of breath or wheezing.). (Patient taking differently: Inhale 2 puffs into the lungs every 4 (four) hours as needed for wheezing or shortness of breath.) 6.7 g 3    clonazePAM (KLONOPIN) 0.5 MG tablet Take 1 tablet (0.5 mg total) by mouth 2 (two) times daily. 30 tablet 0  lamoTRIgine (LAMICTAL) 150 MG tablet Take 1 tablet (150 mg total) by mouth daily.      NIFEdipine (PROCARDIA XL/NIFEDICAL-XL) 90 MG 24 hr tablet Take 90 mg by mouth at bedtime.      QUEtiapine (SEROQUEL) 100 MG tablet Take 1 tablet (100 mg total) by mouth at bedtime.      traZODone (DESYREL) 100 MG tablet Take 1 tablet (100 mg total) by mouth at bedtime as needed.       Patient Stressors: Financial difficulties   Health problems    Patient Strengths: Supportive family/friends   Treatment Modalities: Medication Management, Group therapy, Case management,  1 to 1 session with clinician, Psychoeducation, Recreational therapy.   Physician Treatment Plan for Primary Diagnosis: Bipolar 1 disorder, mixed (HCC) Long Term Goal(s): Improvement in symptoms so as ready for discharge   Short Term Goals: Ability to identify changes in lifestyle to reduce recurrence of  condition will improve Ability to verbalize feelings will improve Ability to disclose and discuss suicidal ideas Ability to demonstrate self-control will improve Ability to identify and develop effective coping behaviors will improve Compliance with prescribed medications will improve  Medication Management: Evaluate patient's response, side effects, and tolerance of medication regimen.  Therapeutic Interventions: 1 to 1 sessions, Unit Group sessions and Medication administration.  Evaluation of Outcomes: Progressing  Physician Treatment Plan for Secondary Diagnosis: Active Problems:   Asthma   Bipolar I disorder, most recent episode depressed, severe without psychotic features (HCC)   Anxiety   Insomnia   Essential hypertension   COVID-19  Long Term Goal(s): Improvement in symptoms so as ready for discharge   Short Term Goals: Ability to identify changes in lifestyle to reduce recurrence of condition will improve Ability to verbalize feelings will improve Ability to disclose and discuss suicidal ideas Ability to demonstrate self-control will improve Ability to identify and develop effective coping behaviors will improve Compliance with prescribed medications will improve     Medication Management: Evaluate patient's response, side effects, and tolerance of medication regimen.  Therapeutic Interventions: 1 to 1 sessions, Unit Group sessions and Medication administration.  Evaluation of Outcomes: Progressing   RN Treatment Plan for Primary Diagnosis: Bipolar 1 disorder, mixed (HCC) Long Term Goal(s): Knowledge of disease and therapeutic regimen to maintain health will improve  Short Term Goals: Ability to remain free from injury will improve, Ability to verbalize frustration and anger appropriately will improve, Ability to demonstrate self-control, Ability to participate in decision making will improve, Ability to verbalize feelings will improve, Ability to disclose and discuss  suicidal ideas, Ability to identify and develop effective coping behaviors will improve, and Compliance with prescribed medications will improve  Medication Management: RN will administer medications as ordered by provider, will assess and evaluate patient's response and provide education to patient for prescribed medication. RN will report any adverse and/or side effects to prescribing provider.  Therapeutic Interventions: 1 on 1 counseling sessions, Psychoeducation, Medication administration, Evaluate responses to treatment, Monitor vital signs and CBGs as ordered, Perform/monitor CIWA, COWS, AIMS and Fall Risk screenings as ordered, Perform wound care treatments as ordered.  Evaluation of Outcomes: Progressing   LCSW Treatment Plan for Primary Diagnosis: Bipolar 1 disorder, mixed (HCC) Long Term Goal(s): Safe transition to appropriate next level of care at discharge, Engage patient in therapeutic group addressing interpersonal concerns.  Short Term Goals: Engage patient in aftercare planning with referrals and resources, Increase social support, Increase ability to appropriately verbalize feelings, Increase emotional regulation, Facilitate acceptance of mental health diagnosis and  concerns, Facilitate patient progression through stages of change regarding substance use diagnoses and concerns, Identify triggers associated with mental health/substance abuse issues, and Increase skills for wellness and recovery  Therapeutic Interventions: Assess for all discharge needs, 1 to 1 time with Social worker, Explore available resources and support systems, Assess for adequacy in community support network, Educate family and significant other(s) on suicide prevention, Complete Psychosocial Assessment, Interpersonal group therapy.  Evaluation of Outcomes: Progressing   Progress in Treatment: Attending groups: Yes. Participating in groups: Yes. Taking medication as prescribed: Yes. Toleration  medication: Yes. Family/Significant other contact made: Yes, individual(s) contacted:  Georgiana Spillane [phone] (Husband)  Patient understands diagnosis: Yes. Discussing patient identified problems/goals with staff: Yes. Medical problems stabilized or resolved: Yes. Denies suicidal/homicidal ideation: Yes. Issues/concerns per patient self-inventory: Yes. Other:   New problem(s) identified: No, Describe:  None Reported  New Short Term/Long Term Goal(s):Medication Stabilization  Patient Goals:  Coping Skills  Discharge Plan or Barriers: None Reported  Reason for Continuation of Hospitalization: Anxiety Depression Medical Issues Medication stabilization Suicidal ideation  Estimated Length of Stay: 3-7 Days  Last 3 [place] Suicide Severity Risk Score: Flowsheet Row [facility]Current[facility] Most recent reading at 08/14/2022  1:35 PM ED from 08/10/2022 in [facility] Most recent reading at 08/11/2022  7:44 AM ED from 08/10/2022 in [facility] Most recent reading at 08/10/2022  7:46 PM  C-SSRS RISK CATEGORY Low Risk High Risk Error: Q7 should not be populated when Q6 is No       Last PHQ 2/9 Scores:    08/10/2022    7:45 PM  Depression screen PHQ 2/9  Decreased Interest 3  Down, Depressed, Hopeless 3  PHQ - 2 Score 6  Altered sleeping 3  Tired, decreased energy 1  Change in appetite 3  Feeling bad or failure about yourself  1  Trouble concentrating 2  Moving slowly or fidgety/restless 2  Suicidal thoughts 2  PHQ-9 Score 20  Difficult doing work/chores Very difficult    medication stabilization, elimination of SI thoughts, development of comprehensive mental wellness plan.    Scribe for Treatment Team: Ane Payment, LCSW 09/04/2022 11:07 AM

## 2022-09-04 NOTE — Progress Notes (Signed)
  [facility] Adult Case Management Discharge Plan :  Will you be returning to the same living situation after discharge:  Yes,  Home with Husband  At discharge, do you have transportation home?: Yes,  Mother  Do you have the ability to pay for your medications: Yes,  Medicaid   Release of information consent forms completed and in the chart;  Patient's signature needed at discharge.  Patient to Follow up at:  Follow-up Information     [facility]. Go to.   Specialty: Behavioral Health Why: Please go to this provider for an assessment, to obtain therapy and/or medication management services, during walk in hours:  Monday through Friday - *MUST ARRIVE NO LATER THAN 7:20 AM as services are first come, first served and end at 11:00 am. Contact information: [address] [place] [address] [phone]        [facility]. Go on 09/08/2022.   Specialty: Behavioral Health Why: You have an appointment for medication management services on 09/08/22 at 10:30 am..   This appointment will be held in person. Contact information: [address] [address] [phone]                 Next level of care provider has access to [facility] Link:yes  Safety Planning and Suicide Prevention discussed: Yes,  with patient and husband      Has patient been referred to the Quitline?: N/A patient is not a smoker  Patient has been referred for addiction treatment: N/A  Aram Beecham, LCSWA 09/04/2022, 11:20 AM

## 2022-09-04 NOTE — BHH Suicide Risk Assessment (Addendum)
[facility] Discharge Suicide Risk Assessment   Principal Problem: Bipolar 1 disorder, mixed (HCC) Discharge Diagnoses: Active Problems:   Asthma   Bipolar I disorder, most recent episode depressed, severe without psychotic features (HCC)   Anxiety   Insomnia   Essential hypertension   COVID-19   Total Time spent with patient: 30 minutes  Musculoskeletal: Strength & Muscle Tone: within normal limits Gait & Station: normal Patient leans: Front  Psychiatric Specialty Exam  Presentation  General Appearance:  Appropriate for Environment Eye Contact:Good Speech:Clear and Coherent Speech Volume:Normal Handedness:Right  Mood and Affect  Mood:Euthymic Duration of Depression Symptoms: Greater than two weeks Affect: Slightly restricted, sedated  Thought Process  Thought Processes:Coherent Descriptions of Associations:Intact Orientation:Full (Time, Place and Person) Thought Content:Logical History of Schizophrenia/Schizoaffective disorder:No Duration of Psychotic Symptoms: Nil Hallucinations: Patient denies, not responding to internal stimuli Ideas of Reference: None Suicidal Thoughts: Patient denies Homicidal Thoughts: Patient denies  Sensorium  Memory:Intact based on interview Judgment: Fair-Good Insight: Fair  Art therapist  Concentration: Fair Attention Span:Fair Recall: Sales promotion account executive of Knowledge: Good Language: Good  Psychomotor Activity  Psychomotor Activity: Slightly slow, improving  Assets  Assets: Responsibility to family, future oriented, Communication Skills   Sleep  Sleep: Good  Physical Exam: Physical Exam Constitutional:      Appearance: Normal appearance. She is obese.  HENT:     Head: Normocephalic and atraumatic.     Nose: Nose normal.     Mouth/Throat:     Mouth: Mucous membranes are moist.     Pharynx: Oropharynx is clear.  Eyes:     Extraocular Movements: Extraocular movements intact.     Conjunctiva/sclera: Conjunctivae normal.      Pupils: Pupils are equal, round, and reactive to light.  Musculoskeletal:     Cervical back: Normal range of motion and neck supple.  Skin:    General: Skin is warm.  Neurological:     General: No focal deficit present.     Mental Status: She is alert and oriented to person, place, and time. Mental status is at baseline.  Psychiatric:        Mood and Affect: Mood normal.        Behavior: Behavior normal.        Thought Content: Thought content normal.        Judgment: Judgment normal.    Review of Systems  Constitutional: Negative.   HENT: Negative.    Eyes: Negative.   Respiratory: Negative.    Cardiovascular: Negative.   Gastrointestinal: Negative.   Genitourinary: Negative.   Musculoskeletal: Negative.   Skin: Negative.   Neurological: Negative.   Endo/Heme/Allergies: Negative.   Psychiatric/Behavioral: Negative.     Blood pressure 102/69, pulse 93, temperature 98.3 F (36.8 C), temperature source Oral, resp. rate 18, height 5\' 5"  (1.651 m), weight 91.6 kg, SpO2 98 %. Body mass index is 33.61 kg/m.  Mental Status Per Nursing Assessment::   On Admission:  Suicidal ideation indicated by patient  Demographic Factors:  NA  Loss Factors: NA  Historical Factors: NA  Risk Reduction Factors:   Sense of responsibility to family, Living with another person, especially a relative, Positive social support, Positive therapeutic relationship, and Positive coping skills or problem solving skills  Continued Clinical Symptoms:  Severe Anxiety and/or Agitation  Cognitive Features That Contribute To Risk:  None    Suicide Risk:  Minimal: No identifiable suicidal ideation.  Patients presenting with no risk factors but with morbid ruminations; may be classified as minimal risk based  on the severity of the depressive symptoms   Follow-up Information     [facility]. Go to.   Specialty: Behavioral Health Why: Please go to this provider for an  assessment, to obtain therapy and/or medication management services, during walk in hours:  Monday through Friday - *MUST ARRIVE NO LATER THAN 7:20 AM as services are first come, first served and end at 11:00 am. Contact information: [address] [place] [address] [phone]        [facility]. Go on 09/08/2022.   Specialty: Behavioral Health Why: You have an appointment for medication management services on 09/08/22 at 10:30 am..   This appointment will be held in person. Contact information: [address] [place] [address] [phone]                 Plan Of Care/Follow-up recommendations:  Activity:  Normal daily activity Diet:  Normal diet  Antionette Poles, MD 09/04/2022, 10:38 AM

## 2022-09-04 NOTE — Progress Notes (Signed)
  Administered PRN Trazodone per MAR per pt request.  

## 2022-09-04 NOTE — Progress Notes (Signed)
Patient discharged from [facility] on 09/04/2022 at 1230. Patient denies SI, plan, and intention. Suicide safety plan completed, reviewed with this RN, given to the patient, and a copy in the chart. Patient denies HI/AVH upon discharge. Patient rates her depression a 0/10 and her anxiety a 0/10. Patient is alert, oriented, and cooperative. RN provided patient with discharge paperwork and reviewed information with patient. Patient expressed that she understood all of the discharge instructions. Pt was satisfied with belongings returned to her from the locker and at bedside. Discharged patient to [facility] waiting room. Pt's mother awaiting patient in the [facility] waiting room.

## 2022-09-05 NOTE — Discharge Summary (Incomplete)
Physician Discharge Summary Note  Patient:  Kara Stephens is an 36 y.o., female MRN:  [id number] DOB:  Sep 17, 1985 Patient phone:  [phone] (home)  Patient address:   [address] [place] [address],  Total Time spent with patient: 45 minutes  Date of Admission:  08/11/2022 Date of Discharge: 09/04/2022  Reason for Admission:  Obsessive thoughts regarding harming her 42-month-old son.  Principal Problem: Bipolar 1 disorder, mixed (HCC) Discharge Diagnoses: Active Problems:   Asthma   Bipolar I disorder, most recent episode depressed, severe without psychotic features (HCC)   Anxiety   Insomnia   Essential hypertension   COVID-19   Past Psychiatric History: ***  Past Medical History:  Past Medical History:  Diagnosis Date   Anxiety    Back spasm    Bipolar I disorder (HCC)    Hypertension    Tachycardia     Past Surgical History:  Procedure Laterality Date   APPENDECTOMY     CHOLECYSTECTOMY     HAND SURGERY     SHOULDER SURGERY     Family History:  Family History  Problem Relation Age of Onset   Hypertension Mother    Hyperlipidemia Mother    Suicidality Father    Depression Father    Family Psychiatric  History: *** Social History:  Social History   Substance and Sexual Activity  Alcohol Use No     Social History   Substance and Sexual Activity  Drug Use No    Social History   Socioeconomic History   Marital status: Married    Spouse name: Not on file   Number of children: Not on file   Years of education: Not on file   Highest education level: Not on file  Occupational History   Not on file  Tobacco Use   Smoking status: Former    Types: Cigarettes    Quit date: 09/10/2016    Years since quitting: 5.9   Smokeless tobacco: Never  Vaping Use   Vaping Use: Never used  Substance and Sexual Activity   Alcohol use: No   Drug use: No   Sexual activity: Not on file  Other Topics Concern   Not on file  Social History Narrative    Not on file   Social Determinants of Health   Financial Resource Strain: Not on file  Food Insecurity: No Food Insecurity (08/11/2022)   Hunger Vital Sign    Worried About Running Out of Food in the Last Year: Never true    Ran Out of Food in the Last Year: Never true  Transportation Needs: No Transportation Needs (08/11/2022)   PRAPARE - Administrator, Civil Service (Medical): No    Lack of Transportation (Non-Medical): No  Physical Activity: Not on file  Stress: Not on file  Social Connections: Not on file    Hospital Course:  ***  Physical Findings: AIMS:  , ,  ,  ,    CIWA:    COWS:     Musculoskeletal: Strength & Muscle Tone: {desc; muscle tone:32375} Gait & Station: {PE GAIT ED SWHQ:75916} Patient leans: {Patient Leans:21022755}   Psychiatric Specialty Exam:  Presentation  General Appearance:  Appropriate for Environment  Eye Contact: Good  Speech: Clear and Coherent  Speech Volume: Normal  Handedness: Right   Mood and Affect  Mood: Euthymic  Affect: Congruent   Thought Process  Thought Processes: Coherent  Descriptions of Associations:Intact  Orientation:Full (Time, Place and Person)  Thought Content:Logical  History of Schizophrenia/Schizoaffective disorder:No  Duration of Psychotic Symptoms:No data recorded Hallucinations:No data recorded Ideas of Reference:None  Suicidal Thoughts:No data recorded Homicidal Thoughts:No data recorded  Sensorium  Memory: Immediate Good  Judgment: Fair  Insight: Fair   Chartered certified accountant: Fair  Attention Span: Fair  Recall: [employer] of Knowledge: Fair  Language: Fair   Psychomotor Activity  Psychomotor Activity:No data recorded  Assets  Assets: Communication Skills   Sleep  Sleep:No data recorded   Physical Exam: Physical Exam ROS Blood pressure 102/69, pulse 93, temperature 98.3 F (36.8 C), temperature source Oral, resp. rate  18, height 5\' 5"  (1.651 m), weight 91.6 kg, SpO2 98 %. Body mass index is 33.61 kg/m.   Social History   Tobacco Use  Smoking Status Former   Types: Cigarettes   Quit date: 09/10/2016   Years since quitting: 5.9  Smokeless Tobacco Never   Tobacco Cessation:  {Discharge tobacco cessation prescription:304700209}   Blood Alcohol level:  Lab Results  Component Value Date   ETH <10 08/10/2022    Metabolic Disorder Labs:  Lab Results  Component Value Date   HGBA1C 5.8 (H) 08/10/2022   MPG 120 08/10/2022   No results found for: "PROLACTIN" Lab Results  Component Value Date   CHOL 202 (H) 08/12/2022   TRIG 154 (H) 08/12/2022   HDL 50 08/12/2022   CHOLHDL 4.0 08/12/2022   VLDL 31 08/12/2022   LDLCALC 121 (H) 08/12/2022    See Psychiatric Specialty Exam and Suicide Risk Assessment completed by Attending Physician prior to discharge.  Discharge destination:  {DISCHARGE DESTINATION:22616}  Is patient on multiple antipsychotic therapies at discharge:  {RECOMMEND TAPERING:22617}   Has Patient had three or more failed trials of antipsychotic monotherapy by history:  {BHH ANTIPSYCHOTIC:22903}  Recommended Plan for Multiple Antipsychotic Therapies: {BHH MULTIPLE ANTIPSYCHOTIC THERAPIES:22905}  Discharge Instructions     Activity as tolerated - No restrictions   Complete by: As directed    Diet general   Complete by: As directed    No wound care   Complete by: As directed       Allergies as of 09/04/2022       Reactions   Sulfa Antibiotics Anaphylaxis, Swelling   Tramadol Other (See Comments)   Manic   Cortisone Other (See Comments)   Pain   Abilify [aripiprazole] Other (See Comments)   Psychosis, muscle spasms, drooling, agitiation    Latuda [lurasidone] Other (See Comments)   Psychosis, muscle spasms, agitation, drooling, incontinence   Sertraline Other (See Comments)   Mania   Clindamycin/lincomycin Itching, Rash, Other (See Comments)   Flushing         Medication List     STOP taking these medications    clonazePAM 0.5 MG tablet Commonly known as: KLONOPIN   NIFEdipine 90 MG 24 hr tablet Commonly known as: PROCARDIA XL/NIFEDICAL-XL   QUEtiapine 100 MG tablet Commonly known as: SEROQUEL Replaced by: QUEtiapine 50 MG Tb24 24 hr tablet   traZODone 100 MG tablet Commonly known as: DESYREL       TAKE these medications      Indication  albuterol 108 (90 Base) MCG/ACT inhaler Commonly known as: VENTOLIN HFA Inhale 2 puffs into the lungs every 4 (four) hours as needed for wheezing or shortness of breath (cough, shortness of breath or wheezing.). What changed: reasons to take this  Indication: Asthma   clomiPRAMINE 25 MG capsule Commonly known as: ANAFRANIL Take 2 capsules (50 mg total) by mouth at bedtime.  Indication: Obsessive Compulsive Disorder   clomiPRAMINE 25 MG capsule Commonly known as: ANAFRANIL Take 1 capsule (25 mg total) by mouth daily.  Indication: Obsessive Compulsive Disorder   lamoTRIgine 200 MG tablet Commonly known as: LAMICTAL Take 1 tablet (200 mg total) by mouth daily. What changed:  medication strength how much to take  Indication: OCD   LORazepam 1 MG tablet Commonly known as: ATIVAN Take 1 tablet (1 mg total) by mouth 2 (two) times daily.  Indication: Feeling Anxious   melatonin 5 MG Tabs Take 1 tablet (5 mg total) by mouth at bedtime.  Indication: Trouble Sleeping   memantine 10 MG tablet Commonly known as: NAMENDA Take 1 tablet (10 mg total) by mouth every 12 (twelve) hours.  Indication: OCD   metFORMIN 500 MG 24 hr tablet Commonly known as: GLUCOPHAGE-XR Take 1 tablet (500 mg total) by mouth daily with breakfast.  Indication: Antipsychotic Therapy-Induced Weight Gain   propranolol ER 60 MG 24 hr capsule Commonly known as: INDERAL LA Take 1 capsule (60 mg total) by mouth daily.  Indication: Feeling Anxious   QUEtiapine 50 MG Tb24 24 hr tablet Commonly known as: SEROQUEL  XR Take 2 tablets (100 mg total) by mouth at bedtime. Replaces: QUEtiapine 100 MG tablet  Indication: OCD   topiramate 50 MG tablet Commonly known as: TOPAMAX Take 1 tablet (50 mg total) by mouth every 12 (twelve) hours.  Indication: OCD        Follow-up Information     [facility]. Go to.   Specialty: Behavioral Health Why: Please go to this provider for an assessment, to obtain therapy and/or medication management services, during walk in hours:  Monday through Friday - *MUST ARRIVE NO LATER THAN 7:20 AM as services are first come, first served and end at 11:00 am. Contact information: [address] [place] [address] [phone]        [facility]. Go on 09/08/2022.   Specialty: Behavioral Health Why: You have an appointment for medication management services on 09/08/22 at 10:30 am..   This appointment will be held in person. Contact information: [address] [place] [address] [phone]                 Follow-up recommendations:  {BHH DC FU RECOMMENDATIONS:22620}  Comments:  ***  Signed: Antionette Poles, MD 09/05/2022, 8:20 PM

## 2023-12-07 ENCOUNTER — Ambulatory Visit (HOSPITAL_COMMUNITY)
Admission: EM | Admit: 2023-12-07 | Discharge: 2023-12-09 | Disposition: A | Discharge status: Other Acute Inpatient Hospital | Payer: MEDICAID | Attending: Psychiatry | Admitting: Psychiatry

## 2023-12-07 DIAGNOSIS — F3110 Bipolar disorder, current episode manic without psychotic features, unspecified: Secondary | ICD-10-CM

## 2023-12-07 DIAGNOSIS — R440 Auditory hallucinations: Secondary | ICD-10-CM | POA: Diagnosis not present

## 2023-12-07 DIAGNOSIS — F319 Bipolar disorder, unspecified: Secondary | ICD-10-CM | POA: Diagnosis not present

## 2023-12-07 LAB — CBC WITH DIFFERENTIAL/PLATELET
Abs Immature Granulocytes: 0.08 10*3/uL — ABNORMAL HIGH (ref 0.00–0.07)
Basophils Absolute: 0.1 10*3/uL (ref 0.0–0.1)
Basophils Relative: 1 %
Eosinophils Absolute: 0.3 10*3/uL (ref 0.0–0.5)
Eosinophils Relative: 2 %
HCT: 41.7 % (ref 36.0–46.0)
Hemoglobin: 13.5 g/dL (ref 12.0–15.0)
Immature Granulocytes: 1 %
Lymphocytes Relative: 31 %
Lymphs Abs: 3.9 10*3/uL (ref 0.7–4.0)
MCH: 28.8 pg (ref 26.0–34.0)
MCHC: 32.4 g/dL (ref 30.0–36.0)
MCV: 88.9 fL (ref 80.0–100.0)
Monocytes Absolute: 0.8 10*3/uL (ref 0.1–1.0)
Monocytes Relative: 6 %
Neutro Abs: 7.4 10*3/uL (ref 1.7–7.7)
Neutrophils Relative %: 59 %
Platelets: 307 10*3/uL (ref 150–400)
RBC: 4.69 MIL/uL (ref 3.87–5.11)
RDW: 14.7 % (ref 11.5–15.5)
WBC: 12.5 10*3/uL — ABNORMAL HIGH (ref 4.0–10.5)
nRBC: 0 % (ref 0.0–0.2)

## 2023-12-07 LAB — LIPID PANEL
Cholesterol: 209 mg/dL — ABNORMAL HIGH (ref 0–200)
HDL: 44 mg/dL (ref >40)
LDL Cholesterol: 92 mg/dL (ref 0–99)
Total CHOL/HDL Ratio: 4.8 ratio
Triglycerides: 367 mg/dL — ABNORMAL HIGH (ref <150)
VLDL: 73 mg/dL — ABNORMAL HIGH (ref 0–40)

## 2023-12-07 LAB — POCT URINE DRUG SCREEN - MANUAL ENTRY (I-SCREEN)
POC Amphetamine UR: NOT DETECTED
POC Buprenorphine (BUP): NOT DETECTED
POC Cocaine UR: NOT DETECTED
POC Marijuana UR: NOT DETECTED
POC Methadone UR: NOT DETECTED
POC Methamphetamine UR: NOT DETECTED
POC Morphine: NOT DETECTED
POC Oxazepam (BZO): POSITIVE — AB
POC Oxycodone UR: NOT DETECTED
POC Secobarbital (BAR): NOT DETECTED

## 2023-12-07 LAB — COMPREHENSIVE METABOLIC PANEL WITH GFR
ALT: 36 U/L (ref 0–44)
AST: 24 U/L (ref 15–41)
Albumin: 4.4 g/dL (ref 3.5–5.0)
Alkaline Phosphatase: 67 U/L (ref 38–126)
Anion gap: 11 (ref 5–15)
BUN: 11 mg/dL (ref 6–20)
CO2: 22 mmol/L (ref 22–32)
Calcium: 9.6 mg/dL (ref 8.9–10.3)
Chloride: 104 mmol/L (ref 98–111)
Creatinine, Ser: 1.02 mg/dL — ABNORMAL HIGH (ref 0.44–1.00)
GFR, Estimated: >60 mL/min (ref >60)
Glucose, Bld: 72 mg/dL (ref 70–99)
Potassium: 3.9 mmol/L (ref 3.5–5.1)
Sodium: 137 mmol/L (ref 135–145)
Total Bilirubin: 0.5 mg/dL (ref 0.0–1.2)
Total Protein: 7.2 g/dL (ref 6.5–8.1)

## 2023-12-07 LAB — POC URINE PREG, ED: Preg Test, Ur: NEGATIVE

## 2023-12-07 LAB — HEMOGLOBIN A1C
Hgb A1c MFr Bld: 5.8 % — ABNORMAL HIGH (ref 4.8–5.6)
Mean Plasma Glucose: 119.76 mg/dL

## 2023-12-07 MED ORDER — MEMANTINE HCL 5 MG PO TABS
10.0000 mg | ORAL_TABLET | Freq: Two times a day (BID) | ORAL | Status: DC
  Administered 2023-12-08 – 2023-12-09 (×3): 10 mg via ORAL
  Filled 2023-12-07 (×4): qty 2

## 2023-12-07 MED ORDER — METFORMIN HCL ER 500 MG PO TB24
500.0000 mg | ORAL_TABLET | Freq: Every day | ORAL | Status: DC
  Administered 2023-12-08 – 2023-12-09 (×2): 500 mg via ORAL
  Filled 2023-12-07 (×2): qty 1

## 2023-12-07 MED ORDER — DIVALPROEX SODIUM 125 MG PO DR TAB
125.0000 mg | DELAYED_RELEASE_TABLET | Freq: Two times a day (BID) | ORAL | Status: DC
  Filled 2023-12-07 (×4): qty 1

## 2023-12-07 MED ORDER — QUETIAPINE FUMARATE ER 50 MG PO TB24: 100.0000 mg | ORAL_TABLET | Freq: Every day | ORAL | Status: DC

## 2023-12-07 MED ORDER — ALUM & MAG HYDROXIDE-SIMETH 200-200-20 MG/5ML PO SUSP: 30.0000 mL | ORAL | Status: DC | PRN

## 2023-12-07 MED ORDER — HALOPERIDOL LACTATE 5 MG/ML IJ SOLN: 10.0000 mg | Freq: Three times a day (TID) | INTRAMUSCULAR | Status: DC | PRN

## 2023-12-07 MED ORDER — TRAZODONE HCL 50 MG PO TABS
50.0000 mg | ORAL_TABLET | Freq: Every evening | ORAL | Status: DC | PRN
  Administered 2023-12-08: 50 mg via ORAL
  Filled 2023-12-07 (×2): qty 1

## 2023-12-07 MED ORDER — DIPHENHYDRAMINE HCL 50 MG/ML IJ SOLN
50.0000 mg | Freq: Three times a day (TID) | INTRAMUSCULAR | Status: DC | PRN
Start: 2023-12-07 — End: 2023-12-09

## 2023-12-07 MED ORDER — QUETIAPINE FUMARATE ER 50 MG PO TB24
100.0000 mg | ORAL_TABLET | Freq: Every day | ORAL | Status: DC
  Administered 2023-12-08: 100 mg via ORAL
  Filled 2023-12-07 (×2): qty 2

## 2023-12-07 MED ORDER — DIVALPROEX SODIUM 125 MG PO DR TAB: 125.0000 mg | DELAYED_RELEASE_TABLET | Freq: Two times a day (BID) | ORAL | Status: DC

## 2023-12-07 MED ORDER — TOPIRAMATE 25 MG PO TABS
50.0000 mg | ORAL_TABLET | Freq: Two times a day (BID) | ORAL | Status: DC
Start: 2023-12-08 — End: 2023-12-09
  Administered 2023-12-08 – 2023-12-09 (×3): 50 mg via ORAL
  Filled 2023-12-07 (×3): qty 2

## 2023-12-07 MED ORDER — HALOPERIDOL LACTATE 5 MG/ML IJ SOLN: 5.0000 mg | Freq: Three times a day (TID) | INTRAMUSCULAR | Status: DC | PRN

## 2023-12-07 MED ORDER — TOPIRAMATE 25 MG PO TABS
50.0000 mg | ORAL_TABLET | Freq: Two times a day (BID) | ORAL | Status: DC
Start: 2023-12-08 — End: 2023-12-07

## 2023-12-07 MED ORDER — MAGNESIUM HYDROXIDE 400 MG/5ML PO SUSP
30.0000 mL | Freq: Every day | ORAL | Status: DC | PRN
Start: 2023-12-07 — End: 2023-12-09

## 2023-12-07 MED ORDER — HALOPERIDOL 5 MG PO TABS: 5.0000 mg | ORAL_TABLET | Freq: Three times a day (TID) | ORAL | Status: DC | PRN

## 2023-12-07 MED ORDER — NICOTINE 21 MG/24HR TD PT24
21.0000 mg | MEDICATED_PATCH | Freq: Every day | TRANSDERMAL | Status: DC
  Administered 2023-12-08 – 2023-12-09 (×2): 21 mg via TRANSDERMAL
  Filled 2023-12-07 (×2): qty 1

## 2023-12-07 MED ORDER — DIPHENHYDRAMINE HCL 50 MG/ML IJ SOLN: 50.0000 mg | Freq: Three times a day (TID) | INTRAMUSCULAR | Status: DC | PRN

## 2023-12-07 MED ORDER — LORAZEPAM 2 MG/ML IJ SOLN: 2.0000 mg | Freq: Three times a day (TID) | INTRAMUSCULAR | Status: DC | PRN

## 2023-12-07 MED ORDER — HYDROXYZINE HCL 25 MG PO TABS
25.0000 mg | ORAL_TABLET | Freq: Three times a day (TID) | ORAL | Status: DC | PRN
Start: 2023-12-07 — End: 2023-12-09

## 2023-12-07 MED ORDER — DIPHENHYDRAMINE HCL 50 MG PO CAPS: 50.0000 mg | ORAL_CAPSULE | Freq: Three times a day (TID) | ORAL | Status: DC | PRN

## 2023-12-07 MED ORDER — ACETAMINOPHEN 325 MG PO TABS: 650.0000 mg | ORAL_TABLET | Freq: Four times a day (QID) | ORAL | Status: DC | PRN

## 2023-12-07 NOTE — ED Notes (Signed)
 Patient A&Ox4. Patient endorse AH. Patient denies SI/HI and VH. Patient oriented to unit. Meal and beverage provided. Patient denies any physical complaints when asked. No acute distress noted. Support and encouragement provided. Routine safety checks conducted according to facility protocol. Encouraged patient to notify staff if thoughts of harm toward self or others arise. Patient verbalize understanding and agreement. Will continue to monitor for safety.

## 2023-12-07 NOTE — BH Assessment (Incomplete)
 Comprehensive Clinical Assessment (CCA) Note  12/07/2023 Kara Stephens [id number]  Disposition: Kara Ghee, NP, recommends overnight observation for safety and stabilization with psych reassessment in the AM.   The patient demonstrates the following risk factors for suicide: Chronic risk factors for suicide include: {Chronic Risk Factors for WUJWJXB:14782956}. Acute risk factors for suicide include: {Acute Risk Factors for OZHYQMV:78469629}. Protective factors for this patient include: {Protective Factors for Suicide BMWU:13244010}. Considering these factors, the overall suicide risk at this point appears to be {Desc; low/moderate/high:110033}. Patient {ACTION; IS/IS UVO:[id number]} appropriate for outpatient follow up.  Kara Stephens is a 38 year old female presenting as a voluntary walk-in to [facility] due to auditory hallucinations and medication problem. Patient reports psychiatric history of Bipolar I and Anxiety. Patient denied SI, HI and alcohol/drug usage. Patient is accompanied by her husband Kara Stephens. Patient gave consent for husband to be present during assessment.      Chief Complaint:  Chief Complaint  Patient presents with  . Hallucinations  . Medication Problem   Visit Diagnosis:  Bipolar  Hallucinations   CCA Screening, Triage and Referral (STR)  Patient Reported Information How did you hear about [place]? Self  What Is the Reason for Your Visit/Call Today? Pt presents to [facility] as a voluntary walk-in, accompanied by her husband with complaint of AH and medication problem. Pt reports that her psychiatrist Kara Stephens) recommend she be evaluated because she feels that she is about to be in a "manic state". Pt reports AH on yesterday. Pt  describes her AH as hearing a cell phone ring/beep or her son crying when he is not present. She denies command hallucinations or thoughts of harm to herself or anyone else. Pt reports having auditory hallucinations since  increasing trilafon over the weekend. Pt reports diagnosis of Bipolar I and anxiety. Pt currently denies SI,HI, AVH and substance/alcohol use.  How Long Has This Been Causing You Problems? <Week  What Do You Feel Would Help You the Most Today? Medication(s)   Have You Recently Had Any Thoughts About Hurting Yourself? No  Are You Planning to Commit Suicide/Harm Yourself At This time? No   Flowsheet Row ED from 12/07/2023 in [facility] Admission (Discharged) from 08/11/2022 in [facility] INPATIENT [facility] ED from 08/10/2022 in [facility]  C-SSRS RISK CATEGORY No Risk Low Risk High Risk       Have you Recently Had Thoughts About Hurting Someone Karolee Ohs? No  Are You Planning to Harm Someone at This Time? No  Explanation: n/a   Have You Used Any Alcohol or Drugs in the Past 24 Hours? No  How Long Ago Did You Use Drugs or Alcohol? No data recorded What Did You Use and How Much? No data recorded  Do You Currently Have a Therapist/Psychiatrist? Yes  Name of Therapist/Psychiatrist: Name of Therapist/Psychiatrist: Melissa Stephens, psychiatrist   Have You Been Recently Discharged From Any Office Practice or Programs? No  Explanation of Discharge From Practice/Program: No data recorded    CCA Screening Triage Referral Assessment Type of Contact: Face-to-Face  Telemedicine Service Delivery:   Is this Initial or Reassessment?   Date Telepsych consult ordered in [facility]:    Time Telepsych consult ordered in [facility]:    Location of Assessment: [facility] Assessment Services  Provider Location: [facility] Assessment Services   Collateral Involvement: none reported   Does Patient Have a Automotive engineer Guardian? No  Legal Guardian Contact Information:  n/a  Copy of Legal Guardianship Form: -- (n/a)  Legal Guardian Notified of Arrival: -- (n/a)  Legal Guardian Notified of Pending Discharge: -- (n/a)  If Minor and  Not Living with Parent(s), Who has Custody? n/a  Is CPS involved or ever been involved? Never  Is APS involved or ever been involved? Never   Patient Determined To Be At Risk for Harm To Self or Others Based on Review of Patient Reported Information or Presenting Complaint? No  Method: No Plan  Availability of Means: No access or NA  Intent: Vague intent or NA  Notification Required: No need or identified person  Additional Information for Danger to Others Potential: -- (n/a)  Additional Comments for Danger to Others Potential: n/a  Are There Guns or Other Weapons in Your Home? No  Types of Guns/Weapons: n/a  Are These Weapons Safely Secured?                            -- (n/a)  Who Could Verify You Are Able To Have These Secured: n/a  Do You Have any Outstanding Charges, Pending Court Dates, Parole/Probation? none reported  Contacted To Inform of Risk of Harm To Self or Others: Family/Significant Other:    Does Patient Present under Involuntary Commitment? No    [place] of Residence: [place]   Patient Currently Receiving the Following Services: Medication Management   Determination of Need: Urgent (48 hours)   Options For Referral: Other: Comment; Outpatient Therapy; Medication Management; BH Urgent Care; Inpatient Hospitalization     CCA Biopsychosocial Patient Reported Schizophrenia/Schizoaffective Diagnosis in Past: No   Strengths: Kara Stephens mother and wife.   Mental Health Symptoms Depression:  None   Duration of Depressive symptoms:    Mania:  Racing thoughts   Anxiety:   Worrying; Tension; Restlessness; Fatigue; Difficulty concentrating   Psychosis:  Hallucinations   Duration of Psychotic symptoms: Duration of Psychotic Symptoms: Less than six months   Trauma:  None   Obsessions:  None   Compulsions:  None   Inattention:  None   Hyperactivity/Impulsivity:  None   Oppositional/Defiant Behaviors:  None   Emotional Irregularity:   None   Other Mood/Personality Symptoms:  N/A    Mental Status Exam Appearance and self-care  Stature:  Average   Weight:  Average weight   Clothing:  Casual   Grooming:  Normal   Cosmetic use:  None   Posture/gait:  Normal   Motor activity:  Not Remarkable   Sensorium  Attention:  Normal   Concentration:  Normal   Orientation:  X5   Recall/memory:  Normal   Affect and Mood  Affect:  Flat   Mood:  Worthless   Relating  Eye contact:  Normal   Facial expression:  Sad   Attitude toward examiner:  Cooperative   Thought and Language  Speech flow: Normal   Thought content:  Appropriate to Mood and Circumstances   Preoccupation:  None   Hallucinations:  Auditory   Organization:  Patent examiner of Knowledge:  Average   Intelligence:  Average   Abstraction:  Normal   Judgement:  Fair   Dance movement psychotherapist:  Adequate   Insight:  Good   Decision Making:  Normal   Social Functioning  Social Maturity:  Responsible   Social Judgement:  Normal   Stress  Stressors:  Other (Comment) (mental health)   Coping Ability:  Overwhelmed; Exhausted   Skill Deficits:  None   Supports:  Family     Religion: Religion/Spirituality Are You A Religious Person?: Yes How Might This Affect Treatment?: N/A  Leisure/Recreation: Leisure / Recreation Do You Have Hobbies?: Yes Leisure and Hobbies: being a mom, walking dogs, listening to music and cooking  Exercise/Diet: Exercise/Diet Do You Exercise?: No Have You Gained or Lost A Significant Amount of Weight in the Past Six Months?: No Number of Pounds Lost?:  (n/a) Do You Follow a Special Diet?: No Do You Have Any Trouble Sleeping?: No Explanation of Sleeping Difficulties: n/a   CCA Employment/Education Employment/Work Situation: Employment / Work Situation Employment Situation: Unemployed Patient's Job has Been Impacted by Current Illness: No Has Patient ever Been in Product manager?: No  Education: Education Is Patient Currently Attending School?: No Last Grade Completed: 12 Did You Product manager?: Yes What Type of College Degree Do you Have?: 4 year degree Did You Have An Individualized Education Program (IIEP): No Did You Have Any Difficulty At School?: No Patient's Education Has Been Impacted by Current Illness: No   CCA Family/Childhood History Family and Relationship History: Family history Marital status: Married Number of Years Married: 6 What types of issues is patient dealing with in the relationship?: none reported Additional relationship information: n/a Does patient have children?: Yes How many children?: 3 How is patient's relationship with their children?: very good  Childhood History:  Childhood History By whom was/is the patient raised?: Both parents Did patient suffer any verbal/emotional/physical/sexual abuse as a child?: No Did patient suffer from severe childhood neglect?: No Has patient ever been sexually abused/assaulted/raped as an adolescent or adult?: No Was the patient ever a victim of a crime or a disaster?: No Witnessed domestic violence?: No Has patient been affected by domestic violence as an adult?: No       CCA Substance Use Alcohol/Drug Use: Alcohol / Drug Use Pain Medications: see MAR Prescriptions: see MAR Over the Counter: see MAR History of alcohol / drug use?: No history of alcohol / drug abuse Longest period of sobriety (when/how long): n/a Negative Consequences of Use:  (n/a) Withdrawal Symptoms:  (n/a)                         ASAM's:  Six Dimensions of Multidimensional Assessment  Dimension 1:  Acute Intoxication and/or Withdrawal Potential:   Dimension 1:  Description of individual's past and current experiences of substance use and withdrawal: n/a  Dimension 2:  Biomedical Conditions and Complications:   Dimension 2:  Description of patient's biomedical conditions and   complications: n/a  Dimension 3:  Emotional, Behavioral, or Cognitive Conditions and Complications:  Dimension 3:  Description of emotional, behavioral, or cognitive conditions and complications: n/a  Dimension 4:  Readiness to Change:  Dimension 4:  Description of Readiness to Change criteria: n/a  Dimension 5:  Relapse, Continued use, or Continued Problem Potential:  Dimension 5:  Relapse, continued use, or continued problem potential critiera description: n/a  Dimension 6:  Recovery/Living Environment:  Dimension 6:  Recovery/Iiving environment criteria description: n/a  ASAM Severity Score:    ASAM Recommended Level of Treatment: ASAM Recommended Level of Treatment:  (n/a)   Substance use Disorder (SUD) Substance Use Disorder (SUD)  Checklist Symptoms of Substance Use:  (n/a)  Recommendations for Services/Supports/Treatments: Recommendations for Services/Supports/Treatments Recommendations For Services/Supports/Treatments: Individual Therapy, Inpatient Hospitalization, Medication Management, Other (Comment)  Disposition Recommendation per psychiatric provider:  Recommends overnight observation for safety and stabilization with psych reassessment  in the AM.    DSM5 Diagnoses: Patient Active Problem List   Diagnosis Date Noted  . Bipolar I disorder, most recent episode depressed, severe without psychotic features (HCC) 08/12/2022  . Anxiety 08/12/2022  . Insomnia 08/12/2022  . Essential hypertension 08/12/2022  . Pilonidal cyst 06/01/2022  . Carpal tunnel syndrome 01/30/2022  . Sciatica of right side 10/29/2021  . Ganglion cyst of flexor tendon sheath of finger, left 09/02/2020  . Trigger finger, left middle finger 08/26/2020  . Obesity (BMI 30.0-34.9) 12/07/2019  . Prediabetes 12/07/2019  . COVID-19 09/21/2019  . Respiratory failure (HCC) 09/21/2019  . Closed displaced fracture of neck of fifth metacarpal bone of left hand 08/03/2015  . Tetrahydrocannabinol (THC) use disorder,  moderate, dependence (HCC) 07/17/2015  . Bipolar affective disorder, current episode manic with psychotic symptoms (HCC) 07/16/2015  . Asthma 03/20/2009  . PAIN IN JOINT, ANKLE AND FOOT 03/20/2009  . Sprain of ankle 03/20/2009     Referrals to Alternative Service(s): Referred to Alternative Service(s):   Place:   Date:   Time:    Referred to Alternative Service(s):   Place:   Date:   Time:    Referred to Alternative Service(s):   Place:   Date:   Time:    Referred to Alternative Service(s):   Place:   Date:   Time:     Burnetta Sabin, [facility]
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
## 2023-12-07 NOTE — Progress Notes (Signed)
   12/07/23 2012  [facility] Triage Screening (Walk-ins at [facility] only)  How Did You Hear About [place]? Self  What Is the Reason for Your Visit/Call Today? Pt presents to [facility] as a voluntary walk-in, accompanied by her husband with complaint of AH and medication problem. Pt reports that her psychiatrist Caroleen Hamman) recommend she be evaluated because she feels that she is about to be in a "manic state". Pt reports AH on yesterday. Pt  describes her AH as hearing a cell phone ring/beep or her son crying when he is not present. She denies command hallucinations or thoughts of harm to herself or anyone else. Pt reports having auditory hallucinations since increasing trilafon over the weekend. Pt reports diagnosis of Bipolar I and anxiety. Pt currently denies SI,HI, AVH and substance/alcohol use.  How Long Has This Been Causing You Problems? <Week  Have You Recently Had Any Thoughts About Hurting Yourself? No  Are You Planning to Commit Suicide/Harm Yourself At This time? No  Have you Recently Had Thoughts About Hurting Someone Karolee Ohs? No  Are You Planning To Harm Someone At This Time? No  Physical Abuse Denies  Verbal Abuse Denies  Sexual Abuse Denies  Exploitation of patient/patient's resources Denies  Self-Neglect Denies  Are you currently experiencing any auditory, visual or other hallucinations? No (experienced hearing her son cry when not present and phone beeping/ringing)  Have You Used Any Alcohol or Drugs in the Past 24 Hours? No  Do you have any current medical co-morbidities that require immediate attention? No  Clinician description of patient physical appearance/behavior: Cooperative, calm, oriented  What Do You Feel Would Help You the Most Today? Medication(s)  If access to [facility] was not available, would you have sought care in the Emergency Department? No  Determination of Need Routine (7 days)  Options For Referral Other: Comment;Outpatient Therapy;Medication Management

## 2023-12-07 NOTE — ED Notes (Signed)
 Pt refused meds.. states she had her meds prior to arrival

## 2023-12-07 NOTE — BH Assessment (Addendum)
 Comprehensive Clinical Assessment (CCA) Note  12/07/2023 Kara Stephens [id number]  Disposition: Rockney Ghee, NP, recommends overnight observation for safety and stabilization with psych reassessment in the AM.   The patient demonstrates the following risk factors for suicide: Chronic risk factors for suicide include: psychiatric disorder of bipolar and anxiety, previous self-harm none reported, and completed suicide in a family member. Acute risk factors for suicide include: unemployment and loss (financial, interpersonal, professional). Protective factors for this patient include: positive therapeutic relationship, responsibility to others (children, family), and hope for the future. Considering these factors, the overall suicide risk at this point appears to be high. Patient is not appropriate for outpatient follow up.  Kara Stephens is a 38 year old female presenting as a voluntary walk-in to [facility] due to auditory hallucinations and medication problem. Patient reports psychiatric history of Bipolar I and Anxiety. Patient denied SI, HI and alcohol/drug usage. Patient is accompanied by her husband Kara Stephens. Patient gave consent for husband to be present during assessment.   Patient reports feeling like she is in a manic state. Patient has become more aggressive in dealing with family members. Patient reports auditory hallucinations of hearing a cell phone ring and hearing her son cry when he is not present. Patient reports hallucinations have increased since the weekend. Patient reports having thoughts to of self-harm of wanting to burn or cut herself. Husband reports noticing a change in behaviors approx 1 month and half. Patient has been engaging in risky behaviors while driving cars, hitting trash cans and sidewalks, smoking. Patient reports increased irritability. Patient denied depressive symptoms. Patient reported normal sleep and appetite.   Patient is currently being seen  by Kara Stephens for medication management. Patient has requested to start outpatient therapist soon. Patient reports prior psychiatric hospitalization at [facility] in 2023. Patient denied prior suicide attempts and self-harming behaviors.   Patient resides with husband and 3 children (20, 33 and 69 month old). Patient has been married for 6 years and reports being together for a total of 16 years. Patient denied family discord. Patient reports her father committed suicide in 2016 by stabbing himself and that several other family members on her fathers side committed suicide. Patient is currently unemployed. Patient denied access to guns. Patient was anxious and cooperative during assessment.    Chief Complaint:  Chief Complaint  Patient presents with   Hallucinations   Medication Problem   Visit Diagnosis:  Bipolar  Hallucinations   CCA Screening, Triage and Referral (STR)  Patient Reported Information How did you hear about [place]? Self  What Is the Reason for Your Visit/Call Today? Pt presents to [facility] as a voluntary walk-in, accompanied by her husband with complaint of AH and medication problem. Pt reports that her psychiatrist Caroleen Hamman) recommend she be evaluated because she feels that she is about to be in a "manic state". Pt reports AH on yesterday. Pt  describes her AH as hearing a cell phone ring/beep or her son crying when he is not present. She denies command hallucinations or thoughts of harm to herself or anyone else. Pt reports having auditory hallucinations since increasing trilafon over the weekend. Pt reports diagnosis of Bipolar I and anxiety. Pt currently denies SI,HI, AVH and substance/alcohol use.  How Long Has This Been Causing You Problems? <Week  What Do You Feel Would Help You the Most Today? Medication(s)   Have You Recently Had Any Thoughts About Hurting Yourself? No  Are You Planning to Commit Suicide/Harm  Yourself At This time? No   Flowsheet Row ED from  12/07/2023 in [facility] Admission (Discharged) from 08/11/2022 in [facility] ED from 08/10/2022 in [facility]  C-SSRS RISK CATEGORY No Risk Low Risk High Risk       Have you Recently Had Thoughts About Hurting Someone Karolee Ohs? No  Are You Planning to Harm Someone at This Time? No  Explanation: n/a   Have You Used Any Alcohol or Drugs in the Past 24 Hours? No  How Long Ago Did You Use Drugs or Alcohol? N/a What Did You Use and How Much? N/a  Do You Currently Have a Therapist/Psychiatrist? Yes  Name of Therapist/Psychiatrist: Name of Therapist/Psychiatrist: Melissa Stephens, psychiatrist   Have You Been Recently Discharged From Any Office Practice or Programs? No  Explanation of Discharge From Practice/Program: n/a    CCA Screening Triage Referral Assessment Type of Contact: Face-to-Face  Telemedicine Service Delivery:  n/a Is this Initial or Reassessment?  N/a Date Telepsych consult ordered in [facility]:   N/a Time Telepsych consult ordered in [facility]:   N/a Location of Assessment: [facility]  Provider Location: [facility] Assessment Services   Collateral Involvement: none reported   Does Patient Have a Automotive engineer Guardian? No  Legal Guardian Contact Information: n/a  Copy of Legal Guardianship Form: -- (n/a)  Legal Guardian Notified of Arrival: -- (n/a)  Legal Guardian Notified of Pending Discharge: -- (n/a)  If Minor and Not Living with Parent(s), Who has Custody? n/a  Is CPS involved or ever been involved? Never  Is APS involved or ever been involved? Never   Patient Determined To Be At Risk for Harm To Self or Others Based on Review of Patient Reported Information or Presenting Complaint? No  Method: No Plan  Availability of Means: No access or NA  Intent: Vague intent or NA  Notification Required: No need or identified person  Additional  Information for Danger to Others Potential: -- (n/a)  Additional Comments for Danger to Others Potential: n/a  Are There Guns or Other Weapons in Your Home? No  Types of Guns/Weapons: n/a  Are These Weapons Safely Secured?                            -- (n/a)  Who Could Verify You Are Able To Have These Secured: n/a  Do You Have any Outstanding Charges, Pending Court Dates, Parole/Probation? none reported  Contacted To Inform of Risk of Harm To Self or Others: Family/Significant Other:    Does Patient Present under Involuntary Commitment? No    [place] of Residence: [place]   Patient Currently Receiving the Following Services: Medication Management   Determination of Need: Urgent (48 hours)   Options For Referral: Other: Comment; Outpatient Therapy; Medication Management; BH Urgent Care; Inpatient Hospitalization     CCA Biopsychosocial Patient Reported Schizophrenia/Schizoaffective Diagnosis in Past: No   Strengths: [place] mother and wife.   Mental Health Symptoms Depression:  None   Duration of Depressive symptoms:    Mania:  Racing thoughts   Anxiety:   Worrying; Tension; Restlessness; Fatigue; Difficulty concentrating   Psychosis:  Hallucinations   Duration of Psychotic symptoms: Duration of Psychotic Symptoms: Less than six months   Trauma:  None   Obsessions:  None   Compulsions:  None   Inattention:  None   Hyperactivity/Impulsivity:  None   Oppositional/Defiant Behaviors:  None   Emotional Irregularity:  None   Other Mood/Personality Symptoms:  N/A    Mental Status Exam Appearance and self-care  Stature:  Average   Weight:  Average weight   Clothing:  Casual   Grooming:  Normal   Cosmetic use:  None   Posture/gait:  Normal   Motor activity:  Not Remarkable   Sensorium  Attention:  Normal   Concentration:  Normal   Orientation:  X5   Recall/memory:  Normal   Affect and Mood  Affect:  Flat   Mood:  Worthless    Relating  Eye contact:  Normal   Facial expression:  Sad   Attitude toward examiner:  Cooperative   Thought and Language  Speech flow: Normal   Thought content:  Appropriate to Mood and Circumstances   Preoccupation:  None   Hallucinations:  Auditory   Organization:  Patent examiner of Knowledge:  Average   Intelligence:  Average   Abstraction:  Normal   Judgement:  Fair   Dance movement psychotherapist:  Adequate   Insight:  Good   Decision Making:  Normal   Social Functioning  Social Maturity:  Responsible   Social Judgement:  Normal   Stress  Stressors:  Other (Comment) (mental health)   Coping Ability:  Overwhelmed; Exhausted   Skill Deficits:  None   Supports:  Family     Religion: Religion/Spirituality Are You A Religious Person?: Yes How Might This Affect Treatment?: N/A  Leisure/Recreation: Leisure / Recreation Do You Have Hobbies?: Yes Leisure and Hobbies: being a mom, walking dogs, listening to music and cooking  Exercise/Diet: Exercise/Diet Do You Exercise?: No Have You Gained or Lost A Significant Amount of Weight in the Past Six Months?: No Number of Pounds Lost?:  (n/a) Do You Follow a Special Diet?: No Do You Have Any Trouble Sleeping?: No Explanation of Sleeping Difficulties: n/a   CCA Employment/Education Employment/Work Situation: Employment / Work Situation Employment Situation: Unemployed Patient's Job has Been Impacted by Current Illness: No Has Patient ever Been in Equities trader?: No  Education: Education Is Patient Currently Attending School?: No Last Grade Completed: 12 Did You Product manager?: Yes What Type of College Degree Do you Have?: 4 year degree Did You Have An Individualized Education Program (IIEP): No Did You Have Any Difficulty At School?: No Patient's Education Has Been Impacted by Current Illness: No   CCA Family/Childhood History Family and Relationship History: Family  history Marital status: Married Number of Years Married: 6 What types of issues is patient dealing with in the relationship?: none reported Additional relationship information: n/a Does patient have children?: Yes How many children?: 3 How is patient's relationship with their children?: very good  Childhood History:  Childhood History By whom was/is the patient raised?: Both parents Did patient suffer any verbal/emotional/physical/sexual abuse as a child?: No Did patient suffer from severe childhood neglect?: No Has patient ever been sexually abused/assaulted/raped as an adolescent or adult?: No Was the patient ever a victim of a crime or a disaster?: No Witnessed domestic violence?: No Has patient been affected by domestic violence as an adult?: No       CCA Substance Use Alcohol/Drug Use: Alcohol / Drug Use Pain Medications: see MAR Prescriptions: see MAR Over the Counter: see MAR History of alcohol / drug use?: No history of alcohol / drug abuse Longest period of sobriety (when/how long): n/a Negative Consequences of Use:  (n/a) Withdrawal Symptoms:  (n/a)  ASAM's:  Six Dimensions of Multidimensional Assessment  Dimension 1:  Acute Intoxication and/or Withdrawal Potential:   Dimension 1:  Description of individual's past and current experiences of substance use and withdrawal: n/a  Dimension 2:  Biomedical Conditions and Complications:   Dimension 2:  Description of patient's biomedical conditions and  complications: n/a  Dimension 3:  Emotional, Behavioral, or Cognitive Conditions and Complications:  Dimension 3:  Description of emotional, behavioral, or cognitive conditions and complications: n/a  Dimension 4:  Readiness to Change:  Dimension 4:  Description of Readiness to Change criteria: n/a  Dimension 5:  Relapse, Continued use, or Continued Problem Potential:  Dimension 5:  Relapse, continued use, or continued problem potential  critiera description: n/a  Dimension 6:  Recovery/Living Environment:  Dimension 6:  Recovery/Iiving environment criteria description: n/a  ASAM Severity Score:    ASAM Recommended Level of Treatment: ASAM Recommended Level of Treatment:  (n/a)   Substance use Disorder (SUD) Substance Use Disorder (SUD)  Checklist Symptoms of Substance Use:  (n/a)  Recommendations for Services/Supports/Treatments: Recommendations for Services/Supports/Treatments Recommendations For Services/Supports/Treatments: Individual Therapy, Inpatient Hospitalization, Medication Management, Other (Comment)  Disposition Recommendation per psychiatric provider:  Recommends overnight observation for safety and stabilization with psych reassessment in the AM.    DSM5 Diagnoses: Patient Active Problem List   Diagnosis Date Noted   Bipolar I disorder, most recent episode depressed, severe without psychotic features (HCC) 08/12/2022   Anxiety 08/12/2022   Insomnia 08/12/2022   Essential hypertension 08/12/2022   Pilonidal cyst 06/01/2022   Carpal tunnel syndrome 01/30/2022   Sciatica of right side 10/29/2021   Ganglion cyst of flexor tendon sheath of finger, left 09/02/2020   Trigger finger, left middle finger 08/26/2020   Obesity (BMI 30.0-34.9) 12/07/2019   Prediabetes 12/07/2019   COVID-19 09/21/2019   Respiratory failure (HCC) 09/21/2019   Closed displaced fracture of neck of fifth metacarpal bone of left hand 08/03/2015   Tetrahydrocannabinol (THC) use disorder, moderate, dependence (HCC) 07/17/2015   Bipolar affective disorder, current episode manic with psychotic symptoms (HCC) 07/16/2015   Asthma 03/20/2009   PAIN IN JOINT, ANKLE AND FOOT 03/20/2009   Sprain of ankle 03/20/2009     Referrals to Alternative Service(s): Referred to Alternative Service(s):   Place:   Date:   Time:    Referred to Alternative Service(s):   Place:   Date:   Time:    Referred to Alternative Service(s):   Place:   Date:    Time:    Referred to Alternative Service(s):   Place:   Date:   Time:     Burnetta Sabin, [facility]

## 2023-12-08 LAB — TSH: TSH: 4.508 u[IU]/mL — ABNORMAL HIGH (ref 0.350–4.500)

## 2023-12-08 NOTE — ED Notes (Signed)
Patient observed resting quietly, eyes closed. Respirations equal and unlabored. Will continue to monitor for safety.  

## 2023-12-08 NOTE — ED Notes (Signed)
 Pt sleeping at this time. Rise and fall of chest noted. Pt in NAD at this time. Will continue to monitor.

## 2023-12-08 NOTE — ED Notes (Signed)
 Patient alert and oriented X 4. She denies SI/HI or AVH. She denies physical pain or discomfort. Pt current sitting in her lounger eating breakfast. She took her medications with no issues. She denies any additional needs at this time. We will continue to monitor for safety.

## 2023-12-08 NOTE — ED Notes (Signed)
 Patient resting quietly in bed with eyes closed. Respirations equal and unlabored, skin warm and dry, NAD. Routine safety checks conducted according to facility protocol. Will continue to monitor for safety.

## 2023-12-08 NOTE — ED Notes (Signed)
 Pt clam and cooperative no c/o pain or distress denies SI/HI/AVH will continue to monitor for safety

## 2023-12-08 NOTE — ED Notes (Signed)
 Pt sleeping@this  time breathing even and unlabored will continue to monitor for safety
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
## 2023-12-09 ENCOUNTER — Other Ambulatory Visit: Payer: Self-pay

## 2023-12-09 ENCOUNTER — Inpatient Hospital Stay (HOSPITAL_COMMUNITY)
Admission: AD | Admit: 2023-12-09 | Discharge: 2023-12-14 | DRG: 885 | Disposition: A | Discharge status: Home / Self Care | Payer: MEDICAID | Source: Intra-hospital | Attending: Psychiatry | Admitting: Psychiatry

## 2023-12-09 ENCOUNTER — Encounter (HOSPITAL_COMMUNITY): Payer: Self-pay | Admitting: Psychiatry

## 2023-12-09 DIAGNOSIS — E119 Type 2 diabetes mellitus without complications: Secondary | ICD-10-CM | POA: Diagnosis present

## 2023-12-09 DIAGNOSIS — Z881 Allergy status to other antibiotic agents status: Secondary | ICD-10-CM | POA: Diagnosis not present

## 2023-12-09 DIAGNOSIS — F3162 Bipolar disorder, current episode mixed, moderate: Secondary | ICD-10-CM | POA: Diagnosis present

## 2023-12-09 DIAGNOSIS — Z6841 Body Mass Index (BMI) 40.0 and over, adult: Secondary | ICD-10-CM

## 2023-12-09 DIAGNOSIS — F429 Obsessive-compulsive disorder, unspecified: Secondary | ICD-10-CM | POA: Diagnosis present

## 2023-12-09 DIAGNOSIS — F419 Anxiety disorder, unspecified: Secondary | ICD-10-CM | POA: Diagnosis present

## 2023-12-09 DIAGNOSIS — Z5982 Transportation insecurity: Secondary | ICD-10-CM

## 2023-12-09 DIAGNOSIS — Z716 Tobacco abuse counseling: Secondary | ICD-10-CM

## 2023-12-09 DIAGNOSIS — I10 Essential (primary) hypertension: Secondary | ICD-10-CM | POA: Diagnosis present

## 2023-12-09 DIAGNOSIS — E7800 Pure hypercholesterolemia, unspecified: Secondary | ICD-10-CM | POA: Diagnosis present

## 2023-12-09 DIAGNOSIS — Z818 Family history of other mental and behavioral disorders: Secondary | ICD-10-CM

## 2023-12-09 DIAGNOSIS — Z79899 Other long term (current) drug therapy: Secondary | ICD-10-CM | POA: Diagnosis not present

## 2023-12-09 DIAGNOSIS — F17200 Nicotine dependence, unspecified, uncomplicated: Secondary | ICD-10-CM | POA: Insufficient documentation

## 2023-12-09 DIAGNOSIS — Z5948 Other specified lack of adequate food: Secondary | ICD-10-CM

## 2023-12-09 DIAGNOSIS — E781 Pure hyperglyceridemia: Secondary | ICD-10-CM | POA: Diagnosis present

## 2023-12-09 DIAGNOSIS — Z8249 Family history of ischemic heart disease and other diseases of the circulatory system: Secondary | ICD-10-CM

## 2023-12-09 DIAGNOSIS — F17210 Nicotine dependence, cigarettes, uncomplicated: Secondary | ICD-10-CM | POA: Diagnosis present

## 2023-12-09 DIAGNOSIS — G4730 Sleep apnea, unspecified: Secondary | ICD-10-CM | POA: Diagnosis present

## 2023-12-09 DIAGNOSIS — Z885 Allergy status to narcotic agent status: Secondary | ICD-10-CM

## 2023-12-09 DIAGNOSIS — Z888 Allergy status to other drugs, medicaments and biological substances status: Secondary | ICD-10-CM

## 2023-12-09 DIAGNOSIS — E66813 Obesity, class 3: Secondary | ICD-10-CM | POA: Diagnosis present

## 2023-12-09 DIAGNOSIS — E559 Vitamin D deficiency, unspecified: Secondary | ICD-10-CM | POA: Diagnosis present

## 2023-12-09 DIAGNOSIS — Z7984 Long term (current) use of oral hypoglycemic drugs: Secondary | ICD-10-CM | POA: Diagnosis not present

## 2023-12-09 DIAGNOSIS — Z8349 Family history of other endocrine, nutritional and metabolic diseases: Secondary | ICD-10-CM

## 2023-12-09 DIAGNOSIS — Z882 Allergy status to sulfonamides status: Secondary | ICD-10-CM

## 2023-12-09 DIAGNOSIS — J45909 Unspecified asthma, uncomplicated: Secondary | ICD-10-CM | POA: Diagnosis present

## 2023-12-09 DIAGNOSIS — F319 Bipolar disorder, unspecified: Secondary | ICD-10-CM | POA: Diagnosis present

## 2023-12-09 HISTORY — DX: Obesity, class 3: E66.813

## 2023-12-09 HISTORY — DX: Other specific personality disorders: F60.89

## 2023-12-09 HISTORY — DX: Type 2 diabetes mellitus without complications: E11.9

## 2023-12-09 HISTORY — DX: Pure hyperglyceridemia: E78.1

## 2023-12-09 HISTORY — DX: Borderline personality disorder: F60.3

## 2023-12-09 HISTORY — DX: Pure hypercholesterolemia, unspecified: E78.00

## 2023-12-09 HISTORY — DX: Undifferentiated somatoform disorder: F45.1

## 2023-12-09 HISTORY — DX: Vitamin D deficiency, unspecified: E55.9

## 2023-12-09 MED ORDER — QUETIAPINE FUMARATE ER 50 MG PO TB24
100.0000 mg | ORAL_TABLET | Freq: Every day | ORAL | Status: DC
  Administered 2023-12-09: 100 mg via ORAL
  Filled 2023-12-09 (×4): qty 2

## 2023-12-09 MED ORDER — LAMOTRIGINE 25 MG PO TABS
25.0000 mg | ORAL_TABLET | Freq: Every day | ORAL | Status: DC
  Administered 2023-12-09: 25 mg via ORAL
  Filled 2023-12-09: qty 1

## 2023-12-09 MED ORDER — MAGNESIUM HYDROXIDE 400 MG/5ML PO SUSP: 30.0000 mL | Freq: Every day | ORAL | Status: DC | PRN

## 2023-12-09 MED ORDER — LORAZEPAM 2 MG/ML IJ SOLN: 2.0000 mg | Freq: Three times a day (TID) | INTRAMUSCULAR | Status: DC | PRN

## 2023-12-09 MED ORDER — HALOPERIDOL 5 MG PO TABS: 5.0000 mg | ORAL_TABLET | Freq: Three times a day (TID) | ORAL | Status: DC | PRN

## 2023-12-09 MED ORDER — ACETAMINOPHEN 325 MG PO TABS
650.0000 mg | ORAL_TABLET | Freq: Four times a day (QID) | ORAL | Status: DC | PRN
  Administered 2023-12-10 – 2023-12-12 (×4): 650 mg via ORAL
  Filled 2023-12-09 (×6): qty 2

## 2023-12-09 MED ORDER — TOPIRAMATE 25 MG PO TABS
50.0000 mg | ORAL_TABLET | Freq: Two times a day (BID) | ORAL | Status: DC
  Administered 2023-12-09 – 2023-12-14 (×10): 50 mg via ORAL
  Filled 2023-12-09 (×15): qty 2

## 2023-12-09 MED ORDER — DIPHENHYDRAMINE HCL 50 MG/ML IJ SOLN: 50.0000 mg | Freq: Three times a day (TID) | INTRAMUSCULAR | Status: DC | PRN

## 2023-12-09 MED ORDER — ALUM & MAG HYDROXIDE-SIMETH 200-200-20 MG/5ML PO SUSP: 30.0000 mL | ORAL | Status: DC | PRN

## 2023-12-09 MED ORDER — MEMANTINE HCL 10 MG PO TABS
10.0000 mg | ORAL_TABLET | Freq: Two times a day (BID) | ORAL | Status: DC
  Administered 2023-12-09 – 2023-12-14 (×10): 10 mg via ORAL
  Filled 2023-12-09 (×16): qty 1

## 2023-12-09 MED ORDER — TRAZODONE HCL 100 MG PO TABS
100.0000 mg | ORAL_TABLET | Freq: Every evening | ORAL | Status: DC | PRN
  Administered 2023-12-09 – 2023-12-13 (×5): 100 mg via ORAL
  Filled 2023-12-09 (×5): qty 1

## 2023-12-09 MED ORDER — HYDROXYZINE HCL 25 MG PO TABS
25.0000 mg | ORAL_TABLET | Freq: Three times a day (TID) | ORAL | Status: DC | PRN
Start: 2023-12-09 — End: 2023-12-14
  Filled 2023-12-09 (×2): qty 1

## 2023-12-09 MED ORDER — NICOTINE 21 MG/24HR TD PT24
21.0000 mg | MEDICATED_PATCH | Freq: Every day | TRANSDERMAL | Status: DC
  Filled 2023-12-09 (×3): qty 1

## 2023-12-09 MED ORDER — HALOPERIDOL LACTATE 5 MG/ML IJ SOLN: 5.0000 mg | Freq: Three times a day (TID) | INTRAMUSCULAR | Status: DC | PRN

## 2023-12-09 MED ORDER — DIPHENHYDRAMINE HCL 25 MG PO CAPS: 50.0000 mg | ORAL_CAPSULE | Freq: Three times a day (TID) | ORAL | Status: DC | PRN

## 2023-12-09 MED ORDER — HALOPERIDOL LACTATE 5 MG/ML IJ SOLN: 10.0000 mg | Freq: Three times a day (TID) | INTRAMUSCULAR | Status: DC | PRN

## 2023-12-09 MED ORDER — METFORMIN HCL ER 500 MG PO TB24
500.0000 mg | ORAL_TABLET | Freq: Every day | ORAL | Status: DC
  Administered 2023-12-10 – 2023-12-14 (×5): 500 mg via ORAL
  Filled 2023-12-09 (×7): qty 1

## 2023-12-09 MED ORDER — LAMOTRIGINE 25 MG PO TABS
25.0000 mg | ORAL_TABLET | Freq: Every day | ORAL | Status: DC
  Administered 2023-12-10: 25 mg via ORAL
  Filled 2023-12-09 (×3): qty 1

## 2023-12-09 NOTE — Tx Team (Signed)
 Initial Treatment Plan 12/09/2023 5:10 PM [name] DGL:[id number]    PATIENT STRESSORS: Medication change or noncompliance     PATIENT STRENGTHS: Religious Affiliation  Supportive family/friends    PATIENT IDENTIFIED PROBLEMS: Medication concerns  Manic state                   DISCHARGE CRITERIA:  Improved stabilization in mood, thinking, and/or behavior  PRELIMINARY DISCHARGE PLAN: Outpatient therapy  PATIENT/FAMILY INVOLVEMENT: This treatment plan has been presented to and reviewed with the patient, Kara Stephens. The patient has been given the opportunity to ask questions and make suggestions.  Debbora Dus, Student-RN 12/09/2023, 5:10 PM

## 2023-12-09 NOTE — ED Notes (Signed)
 Kara Stephens transferred  to [facility]  per NP order. Discussed with the patient and all questions fully answered. An EMTALA and Med Necessity forms were printed and to be given to the receiving nurse. All belongings returned. Patient escorted out and transferred via safe transport. Dickie La  12/09/2023 2:25 PM

## 2023-12-09 NOTE — Discharge Instructions (Addendum)
 Pt transferred to [facility] for inpatient treatment.

## 2023-12-09 NOTE — ED Notes (Signed)
 Report called to Erie Noe, RN [facility].
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
## 2023-12-09 NOTE — Progress Notes (Signed)
 Pt has been accepted to [facility] on 12/09/2023 Bed assignment: 405-1   Pt meets inpatient criteria per: Beverly Milch NP  Attending Physician will be: Dr. Sarita Bottom, MD   Report can be called to:Adult unit: [phone]  Pt can arrive ASAP  Care Team Notified: [facility]  Malva Limes RN, Gaylyn Rong NP, Feliberto Harts RN  [place] Cassian Torelli LCSW-A   12/09/2023 1:20 PM

## 2023-12-09 NOTE — ED Notes (Signed)
 Pt sleeping@this  time breathing even and unlabored will continue to monitor for safety

## 2023-12-09 NOTE — Progress Notes (Signed)
Pt is awake, alert and oriented X3. Pt did not voice any complaints of pain or discomfort. No signs of acute distress noted. Administered scheduled meds per order. Pt denies current SI/HI/AVH, plan or intent. Staff will monitor for pt's safety.

## 2023-12-09 NOTE — Progress Notes (Signed)
 Pt refused AM Depakote.

## 2023-12-09 NOTE — Plan of Care (Addendum)
  Problem: Education: Goal: Emotional status will improve Outcome: Progressing Goal: Mental status will improve Outcome: Progressing   Problem: Coping: Goal: Ability to verbalize frustrations and anger appropriately will improve Outcome: Progressing Goal: Ability to demonstrate self-control will improve Outcome: Progressing   

## 2023-12-09 NOTE — [facility] Group Notes (Signed)
 [facility] Group Notes:  (Nursing/MHT/Case Management/Adjunct)  Date:  12/09/2023  Time:  2000  Type of Therapy:   Wrap up group  Participation Level:  Active  Participation Quality:  Appropriate, Attentive, Sharing, and Supportive  Affect:  Appropriate  Cognitive:  Alert  Insight:  Improving  Engagement in Group:  Engaged  Modes of Intervention:  Clarification, Education, and Support  Summary of Progress/Problems: Positive thinking and positive change were discussed.   Marcille Buffy 12/09/2023, 9:42 PM
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
## 2023-12-10 ENCOUNTER — Encounter (HOSPITAL_COMMUNITY): Payer: Self-pay

## 2023-12-10 DIAGNOSIS — F17200 Nicotine dependence, unspecified, uncomplicated: Secondary | ICD-10-CM | POA: Insufficient documentation

## 2023-12-10 LAB — VITAMIN B12: Vitamin B-12: 314 pg/mL (ref 180–914)

## 2023-12-10 LAB — FOLATE: Folate: 20 ng/mL (ref >5.9)

## 2023-12-10 MED ORDER — QUETIAPINE FUMARATE ER 200 MG PO TB24
200.0000 mg | ORAL_TABLET | Freq: Every day | ORAL | Status: DC
  Administered 2023-12-10 – 2023-12-13 (×4): 200 mg via ORAL
  Filled 2023-12-10 (×7): qty 1

## 2023-12-10 MED ORDER — LAMOTRIGINE 25 MG PO TABS
50.0000 mg | ORAL_TABLET | Freq: Every day | ORAL | Status: DC
  Administered 2023-12-11: 50 mg via ORAL
  Filled 2023-12-10 (×2): qty 2

## 2023-12-10 MED ORDER — LORAZEPAM 2 MG/ML IJ SOLN: 2.0000 mg | Freq: Once | INTRAMUSCULAR | Status: DC | PRN
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
## 2023-12-10 NOTE — [facility] Group Notes (Signed)
 [facility] Group Notes:  (Nursing/MHT/Case Management/Adjunct)  Date:  12/10/2023  Time:  8:38 PM  Type of Therapy:   AA group  Participation Level:  Active  Participation Quality:  Appropriate  Affect:  Appropriate  Cognitive:  Appropriate  Insight:  Appropriate  Engagement in Group:  Engaged  Modes of Intervention:  Education  Summary of Progress/Problems: Attended AA meeting.  Kara Stephens 12/10/2023, 8:38 PM

## 2023-12-10 NOTE — [facility] Counselor (Signed)
 Adult Comprehensive Assessment  Patient ID: NELLY SCRIVEN, female   DOB: [birth year], 38 y.o.   MRN: [id number]  Information Source: Information source: Patient  Current Stressors:  Patient states their primary concerns and needs for treatment are:: "I was in a hypomanic state." Patient states their goals for this hospitilization and ongoing recovery are:: "Medication management. I'm going in a positive direction with medications." Educational / Learning stressors: "no" Employment / Job issues: "no" Family Relationships: "noEngineer, petroleum / Lack of resources (include bankruptcy): "no" Housing / Lack of housing: "no" Physical health (include injuries & life threatening diseases): "no" Social relationships: "no" Substance abuse: "no" Bereavement / Loss: "no"  Living/Environment/Situation:  Living Arrangements: Spouse/significant other Living conditions (as described by patient or guardian): "My home is clean." Who else lives in the home?: Me, my husband, 50 year old daughter and 109-month old son." How long has patient lived in current situation?: "5 years" What is atmosphere in current home: Loving, Supportive  Family History:  Marital status: Married Number of Years Married: 6 What types of issues is patient dealing with in the relationship?: "I don't have any." Additional relationship information: "my husband is my primary support.  He knows me better than anybody else.  He has been around me when I had symptoms (mania, suicidal thoughts, intrusive thoughts)." Are you sexually active?: Yes What is your sexual orientation?: "heterosexual" Has your sexual activity been affected by drugs, alcohol, medication, or emotional stress?: "no" Does patient have children?: Yes How many children?: 3 How is patient's relationship with their children?: "Very good."  My oldest is 17 years old, she doesn't live at home, she lives with her fiance."  Childhood History:  By whom was/is the patient  raised?: Both parents Additional childhood history information: "I had a good childhood.  My dad was bi-polar.  He wasn't diagnosed until later in life, in his 51s.  He passed away by suicide." Description of patient's relationship with caregiver when they were a child: "My relationship with my mom and dad was good." Patient's description of current relationship with people who raised him/her: "My relationship with my mom is very food." How were you disciplined when you got in trouble as a child/adolescent?: "getting whoppings, being grounded" Does patient have siblings?: Yes Number of Siblings: 1 Description of patient's current relationship with siblings: "I have an older sister and we get along well." Did patient suffer any verbal/emotional/physical/sexual abuse as a child?: No Did patient suffer from severe childhood neglect?: No Has patient ever been sexually abused/assaulted/raped as an adolescent or adult?: No Was the patient ever a victim of a crime or a disaster?: No Witnessed domestic violence?: No Has patient been affected by domestic violence as an adult?: No  Education:  Highest grade of school patient has completed: "I have a college degree." Currently a student?: No Learning disability?: No  Employment/Work Situation:   Employment Situation: Unemployed Patient's Job has Been Impacted by Current Illness: Yes Describe how Patient's Job has Been Impacted: "I was wrongfully terminated because I experienced first manic episode." What is the Longest Time Patient has Held a Job?: 5 years Where was the Patient Employed at that Time?: "[employer]" Has Patient ever Been in the [employer]?: No  Financial Resources:   Financial resources: Income from spouse Does patient have a representative payee or guardian?: No  Alcohol/Substance Abuse:   What has been your use of drugs/alcohol within the last 12 months?: 'I vaped marijuana in March 2025." If attempted suicide,  did  drugs/alcohol play a role in this?: No If yes, describe treatment: "no" Has alcohol/substance abuse ever caused legal problems?: No  Social Support System:   Patient's Community Support System: Good Describe Community Support System: "It's good" Type of faith/religion: "I'm Christian." How does patient's faith help to cope with current illness?: "I read the Bible and pray daily."  Leisure/Recreation:   Do You Have Hobbies?: Yes Leisure and Hobbies: "being a mom, walking dogs (I have 2 big dogs), listening to music and cooking."  Strengths/Needs:   What is the patient's perception of their strengths?: "I like to laugh.  I'm a good mom and a good wife.  I love those that love me.  I try to help people as much as I can." Patient states they can use these personal strengths during their treatment to contribute to their recovery: "I have a support system.  My husband will make sure that I have medications that I need.  My husband will make sure I take medications." Patient states these barriers may affect/interfere with their treatment: "I don't really think I have any." Patient states these barriers may affect their return to the community: none reported Other important information patient would like considered in planning for their treatment: none reported  Discharge Plan:   Patient states concerns and preferences for aftercare planning are: Psychiatrist:  [name], [place].  "I need a therapist." Patient states they will know when they are safe and ready for discharge when: "i know myself.  I've been through this process many times.  When I sleep better, and my well-being improves, I will be ready to be discharged." Does patient have access to transportation?: Yes Does patient have financial barriers related to discharge medications?: Yes Patient description of barriers related to discharge medications: none reported Will patient be returning to same living situation after discharge?:  Yes  Summary/Recommendations:   Summary and Recommendations (to be completed by the evaluator): Jhordan Kinter is a 38 year old woman voluntarily admitted to [facility] due to early symtomps of mania that her therapist observed.  She also reported auditory hallucinations, such as hearing a cell phone ringing or her son crying when he is not around.  During the assessment patient was in a good mood and remained calm.  Her main stressor is her mental health needs; she wants her medications adjusted.  She reported that her huband has been supprtive and understanding of her mental health needs.  She has 3 children; one is 20 and move out, and 2 live in the home.  Patient reported that she has a good relationshp with her mom, and her dad passed away by suicide.  She said that she will return to her husband and children at discharge.  While here, Daliya Parchment can benefit from crisis stabilization, medication management, therapeutic milieu, and referrals for services.   Alfredia Ferguson Ian Cavey, LCSWA 12/10/2023

## 2023-12-10 NOTE — Group Note (Signed)
 Date:  12/10/2023 Time:  12:44 PM  Group Topic/Focus:  Goals Group:   The focus of this group is to help patients establish daily goals to achieve during treatment and discuss how the patient can incorporate goal setting into their daily lives to aide in recovery.    Participation Level:  Active  Participation Quality:  Appropriate  Affect:  Appropriate  Erasmo Score 12/10/2023, 12:44 PM

## 2023-12-10 NOTE — Plan of Care (Signed)
 ?  Problem: Activity: ?Goal: Interest or engagement in activities will improve ?Outcome: Progressing ?Goal: Sleeping patterns will improve ?Outcome: Progressing ?  ?Problem: Coping: ?Goal: Ability to verbalize frustrations and anger appropriately will improve ?Outcome: Progressing ?Goal: Ability to demonstrate self-control will improve ?Outcome: Progressing ?  ?Problem: Safety: ?Goal: Periods of time without injury will increase ?Outcome: Progressing ?  ?
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
## 2023-12-10 NOTE — Plan of Care (Signed)
   Problem: Education: Goal: Emotional status will improve Outcome: Progressing Goal: Mental status will improve Outcome: Progressing   Problem: Activity: Goal: Interest or engagement in activities will improve Outcome: Progressing Goal: Sleeping patterns will improve Outcome: Progressing   Problem: Safety: Goal: Periods of time without injury will increase Outcome: Progressing

## 2023-12-10 NOTE — Progress Notes (Signed)
 Patient rated her depression level 0/10 and her anxiety level 4/10 with 10 being the highest and 0 none. Pt identified her goal to work on today as, " My anxiety". Medication and group compliant. Minimal interaction observed with some peers. Appetite good on shift. Safety maintained.  12/10/23 0910  Psych Admission Type (Psych Patients Only)  Admission Status Voluntary  Psychosocial Assessment  Patient Complaints Anxiety  Eye Contact Fair  Facial Expression Anxious  Affect Appropriate to circumstance  Speech Logical/coherent  Interaction Assertive  Motor Activity Other (Comment) (WNL)  Appearance/Hygiene Unremarkable  Behavior Characteristics Cooperative  Mood Anxious;Pleasant  Thought Process  Coherency WDL  Content WDL  Delusions None reported or observed  Perception WDL  Hallucination None reported or observed  Judgment Impaired  Confusion None  Danger to Self  Current suicidal ideation? Denies  Self-Injurious Behavior No self-injurious ideation or behavior indicators observed or expressed   Agreement Not to Harm Self Yes  Description of Agreement Verbal  Danger to Others  Danger to Others None reported or observed

## 2023-12-10 NOTE — Progress Notes (Signed)
   12/09/23 2210  Psych Admission Type (Psych Patients Only)  Admission Status Voluntary  Psychosocial Assessment  Patient Complaints Anxiety  Eye Contact Fair  Facial Expression Anxious  Affect Appropriate to circumstance  Speech Logical/coherent  Interaction Assertive  Motor Activity Other (Comment) (WDL)  Appearance/Hygiene Unremarkable  Behavior Characteristics Cooperative  Mood Pleasant;Anxious  Aggressive Behavior  Effect No apparent injury  Thought Process  Coherency WDL  Content WDL  Delusions None reported or observed  Perception WDL  Hallucination None reported or observed  Judgment Impaired  Danger to Self  Current suicidal ideation? Denies  Self-Injurious Behavior No self-injurious ideation or behavior indicators observed or expressed   Agreement Not to Harm Self Yes  Description of Agreement verbal  Danger to Others  Danger to Others None reported or observed

## 2023-12-10 NOTE — BH IP Treatment Plan (Signed)
 Interdisciplinary Treatment and Diagnostic Plan Update  12/10/2023 Time of Session: 11:05 AM STAPHANY DITTON MRN: [id number]  Principal Diagnosis: Bipolar 1 disorder (HCC)  Secondary Diagnoses: Principal Problem:   Bipolar 1 disorder (HCC) Active Problems:   Nicotine dependence   Current Medications:  Current Facility-Administered Medications  Medication Dose Route Frequency Provider Last Rate Last Admin   acetaminophen (TYLENOL) tablet 650 mg  650 mg Oral Q6H PRN Howie Ill, NP   650 mg at 12/10/23 1110   alum & mag hydroxide-simeth (MAALOX/MYLANTA) 200-200-20 MG/5ML suspension 30 mL  30 mL Oral Q4H PRN Howie Ill, NP       haloperidol (HALDOL) tablet 5 mg  5 mg Oral TID PRN Howie Ill, NP       And   diphenhydrAMINE (BENADRYL) capsule 50 mg  50 mg Oral TID PRN Howie Ill, NP       haloperidol lactate (HALDOL) injection 5 mg  5 mg Intramuscular TID PRN Howie Ill, NP       And   diphenhydrAMINE (BENADRYL) injection 50 mg  50 mg Intramuscular TID PRN Howie Ill, NP       And   LORazepam (ATIVAN) injection 2 mg  2 mg Intramuscular TID PRN Howie Ill, NP       haloperidol lactate (HALDOL) injection 10 mg  10 mg Intramuscular TID PRN Howie Ill, NP       And   diphenhydrAMINE (BENADRYL) injection 50 mg  50 mg Intramuscular TID PRN Howie Ill, NP       And   LORazepam (ATIVAN) injection 2 mg  2 mg Intramuscular TID PRN Howie Ill, NP       hydrOXYzine (ATARAX) tablet 25 mg  25 mg Oral TID PRN Howie Ill, NP       [START ON 12/11/2023] lamoTRIgine (LAMICTAL) tablet 50 mg  50 mg Oral Daily Meryl Dare, MD       LORazepam (ATIVAN) injection 2 mg  2 mg Intramuscular Once PRN Meryl Dare, MD       magnesium hydroxide (MILK OF MAGNESIA) suspension 30 mL  30 mL Oral Daily PRN Howie Ill, NP       memantine (NAMENDA) tablet 10 mg  10 mg Oral BID Gaylyn Rong C, NP   10 mg at 12/10/23 1708   metFORMIN (GLUCOPHAGE-XR) 24 hr tablet  500 mg  500 mg Oral Q breakfast Gaylyn Rong C, NP   500 mg at 12/10/23 4098   nicotine (NICODERM CQ - dosed in mg/24 hours) patch 21 mg  21 mg Transdermal Daily Gaylyn Rong C, NP       QUEtiapine (SEROQUEL XR) 24 hr tablet 200 mg  200 mg Oral QHS Meryl Dare, MD       topiramate (TOPAMAX) tablet 50 mg  50 mg Oral BID Gaylyn Rong C, NP   50 mg at 12/10/23 1709   traZODone (DESYREL) tablet 100 mg  100 mg Oral QHS PRN [place], Chinwendu V, NP   100 mg at 12/09/23 2334   PTA Medications: Medications Prior to Admission  Medication Sig Dispense Refill Last Dose/Taking   albuterol (VENTOLIN HFA) 108 (90 Base) MCG/ACT inhaler Inhale 2 puffs into the lungs every 4 (four) hours as needed for wheezing or shortness of breath (cough, shortness of breath or wheezing.). (Patient taking differently: Inhale 2 puffs into the lungs every 4 (four) hours as needed for wheezing or shortness of breath.) 6.7 g 3  clomiPRAMINE (ANAFRANIL) 25 MG capsule Take 1 capsule (25 mg total) by mouth daily. (Patient taking differently: Take 25-50 mg by mouth 2 (two) times daily. Take one capsule in the morning and two capsules at bedtime) 30 capsule 0    LORazepam (ATIVAN) 1 MG tablet Take 1 tablet (1 mg total) by mouth 2 (two) times daily. (Patient taking differently: Take 1 mg by mouth 3 (three) times daily as needed for anxiety.) 60 tablet 0    memantine (NAMENDA) 10 MG tablet Take 1 tablet (10 mg total) by mouth every 12 (twelve) hours. 60 tablet 0    metFORMIN (GLUCOPHAGE-XR) 500 MG 24 hr tablet Take 1 tablet (500 mg total) by mouth daily with breakfast. 30 tablet 0    nicotine (NICODERM CQ - DOSED IN MG/24 HOURS) 21 mg/24hr patch Place 21 mg onto the skin daily.      nicotine polacrilex (NICORETTE) 2 MG gum Take 2 mg by mouth as needed for smoking cessation.      Prenatal MV-Min-FA-Omega-3 (PRENATAL GUMMIES/DHA & FA PO) Take 2 tablets by mouth daily.      propranolol ER (INDERAL LA) 60 MG 24 hr capsule Take 1 capsule  (60 mg total) by mouth daily. 30 capsule 0    QUEtiapine Fumarate 150 MG TABS Take 150 mg by mouth at bedtime.      topiramate (TOPAMAX) 50 MG tablet Take 1 tablet (50 mg total) by mouth every 12 (twelve) hours. 60 tablet 0    traZODone (DESYREL) 100 MG tablet Take 250 mg by mouth at bedtime.       Patient Stressors: Medication change or noncompliance    Patient Strengths: Religious Affiliation  Supportive family/friends   Treatment Modalities: Medication Management, Group therapy, Case management,  1 to 1 session with clinician, Psychoeducation, Recreational therapy.   Physician Treatment Plan for Primary Diagnosis: Bipolar 1 disorder (HCC) Long Term Goal(s):     Short Term Goals:    Medication Management: Evaluate patient's response, side effects, and tolerance of medication regimen.  Therapeutic Interventions: 1 to 1 sessions, Unit Group sessions and Medication administration.  Evaluation of Outcomes: Not Progressing  Physician Treatment Plan for Secondary Diagnosis: Principal Problem:   Bipolar 1 disorder (HCC) Active Problems:   Nicotine dependence  Long Term Goal(s):     Short Term Goals:       Medication Management: Evaluate patient's response, side effects, and tolerance of medication regimen.  Therapeutic Interventions: 1 to 1 sessions, Unit Group sessions and Medication administration.  Evaluation of Outcomes: Not Progressing   RN Treatment Plan for Primary Diagnosis: Bipolar 1 disorder (HCC) Long Term Goal(s): Knowledge of disease and therapeutic regimen to maintain health will improve  Short Term Goals: Ability to remain free from injury will improve, Ability to verbalize frustration and anger appropriately will improve, Ability to demonstrate self-control, Ability to participate in decision making will improve, Ability to verbalize feelings will improve, Ability to disclose and discuss suicidal ideas, Ability to identify and develop effective coping behaviors  will improve, and Compliance with prescribed medications will improve  Medication Management: RN will administer medications as ordered by provider, will assess and evaluate patient's response and provide education to patient for prescribed medication. RN will report any adverse and/or side effects to prescribing provider.  Therapeutic Interventions: 1 on 1 counseling sessions, Psychoeducation, Medication administration, Evaluate responses to treatment, Monitor vital signs and CBGs as ordered, Perform/monitor CIWA, COWS, AIMS and Fall Risk screenings as ordered, Perform wound care treatments as ordered.  Evaluation of Outcomes: Not Progressing   LCSW Treatment Plan for Primary Diagnosis: Bipolar 1 disorder (HCC) Long Term Goal(s): Safe transition to appropriate next level of care at discharge, Engage patient in therapeutic group addressing interpersonal concerns.  Short Term Goals: Engage patient in aftercare planning with referrals and resources, Increase social support, Increase ability to appropriately verbalize feelings, Increase emotional regulation, Facilitate acceptance of mental health diagnosis and concerns, Facilitate patient progression through stages of change regarding substance use diagnoses and concerns, Identify triggers associated with mental health/substance abuse issues, and Increase skills for wellness and recovery  Therapeutic Interventions: Assess for all discharge needs, 1 to 1 time with Social worker, Explore available resources and support systems, Assess for adequacy in community support network, Educate family and significant other(s) on suicide prevention, Complete Psychosocial Assessment, Interpersonal group therapy.  Evaluation of Outcomes: Not Progressing   Progress in Treatment: Attending groups: Yes. Participating in groups: Yes. Taking medication as prescribed: Yes. Toleration medication: Yes. Family/Significant other contact made: No, will contact:  Camron Essman (husband) [phone]  Patient understands diagnosis: Yes. Discussing patient identified problems/goals with staff: Yes. Medical problems stabilized or resolved: Yes. Denies suicidal/homicidal ideation: Yes. Issues/concerns per patient self-inventory: No.  New problem(s) identified: No  New Short Term/Long Term Goal(s):    medication stabilization, elimination of SI thoughts, development of comprehensive mental wellness plan.   Patient Goals:  "adjust my medications."  Discharge Plan or Barriers:   Reason for Continuation of Hospitalization: [place] Medication stabilization  Estimated Length of Stay:  5 - 7 days  Last 3 [place] Suicide Severity Risk Score: Flowsheet Row [facility]Current[facility] 12[facility]07/2023 in [facility] (Discharged) [facility]  C-SSRS RISK CATEGORY No Risk No Risk Low Risk       Last PHQ 2/9 Scores:    08/10/2022    7:45 PM  Depression screen PHQ 2/9  Decreased Interest 3  Down, Depressed, Hopeless 3  PHQ - 2 Score 6  Altered sleeping 3  Tired, decreased energy 1  Change in appetite 3  Feeling bad or failure about yourself  1  Trouble concentrating 2  Moving slowly or fidgety/restless 2  Suicidal thoughts 2  PHQ-9 Score 20  Difficult doing work/chores Very difficult    Scribe for Treatment Team: Alla Feeling, LCSWA 12/10/2023 6:06 PM

## 2023-12-11 ENCOUNTER — Encounter (HOSPITAL_COMMUNITY): Payer: Self-pay | Admitting: Psychiatry

## 2023-12-11 DIAGNOSIS — F319 Bipolar disorder, unspecified: Secondary | ICD-10-CM | POA: Diagnosis not present

## 2023-12-11 LAB — VITAMIN D 25 HYDROXY (VIT D DEFICIENCY, FRACTURES): Vit D, 25-Hydroxy: 21.31 ng/mL — ABNORMAL LOW (ref 30–100)

## 2023-12-11 LAB — T4, FREE: Free T4: 0.82 ng/dL (ref 0.61–1.12)

## 2023-12-11 LAB — RPR: RPR Ser Ql: NONREACTIVE

## 2023-12-11 LAB — HIV ANTIBODY (ROUTINE TESTING W REFLEX): HIV Screen 4th Generation wRfx: NONREACTIVE

## 2023-12-11 MED ORDER — NICOTINE POLACRILEX 2 MG MT GUM: 2.0000 mg | CHEWING_GUM | OROMUCOSAL | Status: DC | PRN

## 2023-12-11 MED ORDER — VITAMIN D3 25 MCG PO TABS
2000.0000 [IU] | ORAL_TABLET | Freq: Every day | ORAL | Status: DC
  Administered 2023-12-12 – 2023-12-14 (×3): 2000 [IU] via ORAL
  Filled 2023-12-11 (×5): qty 2

## 2023-12-11 MED ORDER — VITAMIN D (ERGOCALCIFEROL) 1.25 MG (50000 UNIT) PO CAPS
50000.0000 [IU] | ORAL_CAPSULE | ORAL | Status: DC
  Administered 2023-12-11: 50000 [IU] via ORAL
  Filled 2023-12-11: qty 1

## 2023-12-11 MED ORDER — LAMOTRIGINE 150 MG PO TABS
150.0000 mg | ORAL_TABLET | Freq: Every day | ORAL | Status: DC
  Administered 2023-12-12 – 2023-12-14 (×3): 150 mg via ORAL
  Filled 2023-12-11 (×5): qty 1

## 2023-12-11 MED ORDER — LAMOTRIGINE 100 MG PO TABS
100.0000 mg | ORAL_TABLET | Freq: Once | ORAL | Status: AC
  Administered 2023-12-11: 100 mg via ORAL
  Filled 2023-12-11 (×2): qty 1

## 2023-12-11 NOTE — Plan of Care (Signed)
 ?  Problem: Activity: ?Goal: Interest or engagement in activities will improve ?Outcome: Progressing ?Goal: Sleeping patterns will improve ?Outcome: Progressing ?  ?Problem: Coping: ?Goal: Ability to verbalize frustrations and anger appropriately will improve ?Outcome: Progressing ?Goal: Ability to demonstrate self-control will improve ?Outcome: Progressing ?  ?Problem: Safety: ?Goal: Periods of time without injury will increase ?Outcome: Progressing ?  ?

## 2023-12-11 NOTE — Progress Notes (Signed)
 [facility] MD Progress Note  12/11/2023 10:48 AM Kara Stephens  MRN:  [id number] Principal Problem: Bipolar 1 disorder (HCC) Diagnosis: Principal Problem:   Bipolar 1 disorder (HCC) Active Problems:   Nicotine dependence   ID & Admission Information: Kara Stephens is an 38 y.o. female who  has a past medical history of Anxiety, Back spasm, Bipolar I disorder (HCC), Borderline personality disorder (HCC), Cluster B personality disorder (HCC), Hypertension, Somatic symptom disorder, and Tachycardia.  She presented on 12/09/2023  2:37 PM for Bipolar 1 disorder (HCC). Patient initially arrived to [facility] on 12/08/23 for what was believed to be acute mania (now more likely thought to be anxious distress), and admitted to [facility] on 12/09/2023 for crisis stabalization and intensive therapeutic interventions.   Subjective:   Case was discussed in the multidisciplinary team. MAR was reviewed and patient was compliant with medications.  No acute events occurred overnight requiring medications for agitation.  Patient has been visible in the milieu and participating in groups.  The patient reports some next-day sedation from the increase in Seroquel from her home dose of 150 mg to 200 mg.  She reports that she slept well last night, and is aware of the adjustment.  For next-day sedation with increases in Seroquel.  Lamictal was increased from 25 mg to 50 mg yesterday, with no ill effects.  As the patient took Lamictal at 150 mg for approximately 1 year previously, we discussed increasing it back to the previous dose.  The risk of potential side effects from rapidly increasing Lamictal was discussed in detail, and the patient feels like the benefits outweigh the risks.  The patient essentially feels back to baseline at this point.  The resident spoke with her husband yesterday who has no concerns about her returning home.  If she continues to improve, she should be appropriate for discharge tomorrow.   Past  Psychiatric and Medical Medical History:  Past Medical History:  Diagnosis Date   Anxiety    Back spasm    Bipolar I disorder (HCC)    Borderline personality disorder (HCC)    Cluster B personality disorder (HCC)    Hypertension    Somatic symptom disorder    Tachycardia     Past Surgical History:  Procedure Laterality Date   APPENDECTOMY     CHOLECYSTECTOMY     HAND SURGERY     SHOULDER SURGERY      Family History(Medical and Psychiatric):  Family History  Problem Relation Age of Onset   Hypertension Mother    Hyperlipidemia Mother    Suicidality Father    Depression Father      Social History:  Social History   Substance and Sexual Activity  Alcohol Use No     Social History   Substance and Sexual Activity  Drug Use No    Social History   Socioeconomic History   Marital status: Married    Spouse name: Not on file   Number of children: Not on file   Years of education: Not on file   Highest education level: Not on file  Occupational History   Not on file  Tobacco Use   Smoking status: Every Day    Current packs/day: 0.50    Average packs/day: 0.5 packs/day for 5.1 years (2.5 ttl pk-yrs)    Types: Cigarettes    Start date: 11/2018    Last attempt to quit: 09/10/2016   Smokeless tobacco: Never  Vaping Use   Vaping status: Never Used  Substance and Sexual Activity   Alcohol use: No   Drug use: No   Sexual activity: Not on file  Other Topics Concern   Not on file  Social History Narrative   Not on file   Social Drivers of Health   Financial Resource Strain: Not on file  Food Insecurity: No Food Insecurity (12/09/2023)   Hunger Vital Sign    Worried About Running Out of Food in the Last Year: Never true    Ran Out of Food in the Last Year: Never true  Recent Concern: Food Insecurity - Food Insecurity Present (11/16/2023)   Received from [facility]   Hunger Vital Sign    Worried About Running Out of Food in the Last Year: Sometimes true    Ran  Out of Food in the Last Year: Sometimes true  Transportation Needs: No Transportation Needs (12/09/2023)   PRAPARE - Administrator, Civil Service (Medical): No    Lack of Transportation (Non-Medical): No  Recent Concern: Transportation Needs - Unmet Transportation Needs (12/07/2023)   PRAPARE - Transportation    Lack of Transportation (Medical): Yes    Lack of Transportation (Non-Medical): Yes  Physical Activity: Not on file  Stress: Stress Concern Present (11/16/2023)   Received from [facility] - Occupational Stress Questionnaire    Feeling of Stress : Very much  Social Connections: Unknown (01/10/2022)   Received from [facility]   Social Network    Social Network: Not on file        Current Medications: Current Facility-Administered Medications  Medication Dose Route Frequency Provider Last Rate Last Admin   acetaminophen (TYLENOL) tablet 650 mg  650 mg Oral Q6H PRN Howie Ill, NP   650 mg at 12/10/23 1110   alum & mag hydroxide-simeth (MAALOX/MYLANTA) 200-200-20 MG/5ML suspension 30 mL  30 mL Oral Q4H PRN Howie Ill, NP       haloperidol (HALDOL) tablet 5 mg  5 mg Oral TID PRN Howie Ill, NP       And   diphenhydrAMINE (BENADRYL) capsule 50 mg  50 mg Oral TID PRN Howie Ill, NP       haloperidol lactate (HALDOL) injection 5 mg  5 mg Intramuscular TID PRN Howie Ill, NP       And   diphenhydrAMINE (BENADRYL) injection 50 mg  50 mg Intramuscular TID PRN Howie Ill, NP       And   LORazepam (ATIVAN) injection 2 mg  2 mg Intramuscular TID PRN Howie Ill, NP       haloperidol lactate (HALDOL) injection 10 mg  10 mg Intramuscular TID PRN Howie Ill, NP       And   diphenhydrAMINE (BENADRYL) injection 50 mg  50 mg Intramuscular TID PRN Howie Ill, NP       And   LORazepam (ATIVAN) injection 2 mg  2 mg Intramuscular TID PRN Howie Ill, NP       hydrOXYzine (ATARAX) tablet 25 mg   25 mg Oral TID PRN Howie Ill, NP       lamoTRIgine (LAMICTAL) tablet 100 mg  100 mg Oral Once Golda Acre, MD       [START ON 12/12/2023] lamoTRIgine (LAMICTAL) tablet 150 mg  150 mg Oral Daily Golda Acre, MD       LORazepam (ATIVAN) injection 2 mg  2 mg Intramuscular Once PRN Meryl Dare,  MD       magnesium hydroxide (MILK OF MAGNESIA) suspension 30 mL  30 mL Oral Daily PRN Howie Ill, NP       memantine (NAMENDA) tablet 10 mg  10 mg Oral BID Gaylyn Rong C, NP   10 mg at 12/11/23 0746   metFORMIN (GLUCOPHAGE-XR) 24 hr tablet 500 mg  500 mg Oral Q breakfast Gaylyn Rong C, NP   500 mg at 12/11/23 0745   nicotine polacrilex (NICORETTE) gum 2 mg  2 mg Oral PRN Golda Acre, MD       QUEtiapine (SEROQUEL XR) 24 hr tablet 200 mg  200 mg Oral QHS Meryl Dare, MD   200 mg at 12/10/23 2122   topiramate (TOPAMAX) tablet 50 mg  50 mg Oral BID Gaylyn Rong C, NP   50 mg at 12/11/23 0745   traZODone (DESYREL) tablet 100 mg  100 mg Oral QHS PRN Onuoha, Chinwendu V, NP   100 mg at 12/10/23 2122   Vitamin D (Ergocalciferol) (DRISDOL) 1.25 MG (50000 UNIT) capsule 50,000 Units  50,000 Units Oral Q7 days Golda Acre, MD   50,000 Units at 12/11/23 0949   [START ON 12/12/2023] vitamin D3 (CHOLECALCIFEROL) tablet 2,000 Units  2,000 Units Oral Daily Golda Acre, MD        Lab Results:  Results for orders placed or performed during the hospital encounter of 12/09/23 (from the past 48 hours)  T4, free     Status: None   Collection Time: 12/10/23  6:30 PM  Result Value Ref Range   Free T4 0.82 0.61 - 1.12 ng/dL    Comment: (NOTE) Biotin ingestion may interfere with free T4 tests. If the results are inconsistent with the TSH level, previous test results, or the clinical presentation, then consider biotin interference. If needed, order repeat testing after stopping biotin. Performed at [facility], [address] [place], [place] [address]   Folate     Status: None    Collection Time: 12/10/23  6:30 PM  Result Value Ref Range   Folate 20.0 >5.9 ng/mL    Comment: Performed at [facility], [address] [place], [place] [address]  RPR     Status: None   Collection Time: 12/10/23  6:30 PM  Result Value Ref Range   RPR Ser Ql NON REACTIVE NON REACTIVE    Comment: Performed at [facility], [address], [place], [place] [address]  Vitamin B12     Status: None   Collection Time: 12/10/23  6:30 PM  Result Value Ref Range   Vitamin B-12 314 180 - 914 pg/mL    Comment: (NOTE) This assay is not validated for testing neonatal or myeloproliferative syndrome specimens for Vitamin B12 levels. Performed at [facility], [address] [place], [place] [address]   VITAMIN D 25 Hydroxy (Vit-D Deficiency, Fractures)     Status: Abnormal   Collection Time: 12/10/23  6:30 PM  Result Value Ref Range   Vit D, 25-Hydroxy 21.31 (L) 30 - 100 ng/mL    Comment: (NOTE) Vitamin D deficiency has been defined by the Institute of Medicine  and an Endocrine Society practice guideline as a level of serum 25-OH  vitamin D less than 20 ng/mL (1,2). The Endocrine Society went on to  further define vitamin D insufficiency as a level between 21 and 29  ng/mL (2).  1. IOM (Institute of Medicine). 2010. Dietary reference intakes for  calcium and D. [place]: [employer]. 2. Holick MF, Binkley Winder, Bischoff-Ferrari HA, et al. Evaluation,  treatment, and prevention of vitamin D deficiency: an Endocrine  Society clinical practice guideline, JCEM. 2011 Jul; 96(7): 1911-30.  Performed at [facility], [address] [place], [place] [address]   HIV Antibody (routine testing w rflx)     Status: None   Collection Time: 12/10/23  6:30 PM  Result Value Ref Range   HIV Screen 4th Generation wRfx Non Reactive Non Reactive    Comment: Performed at [facility], [address], [place], [place] [address]     Blood Alcohol level:  Lab Results  Component Value Date   ETH <10 08/10/2022    Metabolic Disorder Labs: Lab Results  Component Value Date   HGBA1C 5.8 (H) 12/07/2023   MPG 119.76 12/07/2023   MPG 120 08/10/2022   No results found for: "PROLACTIN" Lab Results  Component Value Date   CHOL 209 (H) 12/07/2023   TRIG 367 (H) 12/07/2023   HDL 44 12/07/2023   CHOLHDL 4.8 12/07/2023   VLDL 73 (H) 12/07/2023   LDLCALC 92 12/07/2023   LDLCALC 121 (H) 08/12/2022    Physical Findings: AIMS:  , ,  ,  ,    CIWA:    COWS:     Psychiatric Specialty Exam:  Presentation  General Appearance: Appropriate for Environment  Eye Contact: Good  Speech: Clear and Coherent; Normal Rate  Speech Volume: Normal  Handedness: Right   Mood and Affect  Mood: Euthymic  Affect: Congruent   Thought Process  Thought Processes: Linear  Descriptions of Associations: Intact  Orientation: Full (Time, Place and Person)  Thought Content: Logical  History of Schizophrenia/Schizoaffective disorder: No  Duration of Psychotic Symptoms: NA Hallucinations: Hallucinations: None  Ideas of Reference: None  Suicidal Thoughts: Suicidal Thoughts: No  Homicidal Thoughts: Homicidal Thoughts: No   Sensorium  Memory: Immediate Good  Judgment: Good  Insight: Good   Executive Functions  Concentration: Good  Attention Span: Good  Recall: Good  Fund of Knowledge: Good  Language: Good   Psychomotor Activity  Psychomotor Activity: Psychomotor Activity: Normal   Assets  Assets: Communication Skills; Social Support; Housing; Leisure Time   Sleep  Sleep: Sleep: Good Number of Hours of Sleep: 8.25   Musculoskeletal: Strength & Muscle Tone: within normal limits Gait & Station: normal Patient leans: N/A   Physical Exam: General: Sitting comfortably. NAD. HEENT: Normocephalic, atraumatic, MMM, EMOI Lungs: no increased work of breathing noted Heart: no  cyanosis Abdomen: Non distended Musculoskeletal: FROM. No obvious deformities Skin: Warm, dry, intact. No rashes noted Neuro: No obvious focal deficits.  Gait and station are normal  Review of Systems  Constitutional: Negative.   HENT: Negative.    Eyes: Negative.   Respiratory: Negative.    Cardiovascular: Negative.   Gastrointestinal: Negative.   Genitourinary: Negative.   Skin: Negative.   Neurological: Negative.   Psychiatric/Behavioral: Negative   Blood pressure (!) 133/92, pulse 99, temperature 98.1 F (36.7 C), temperature source Oral, resp. rate 18, height 5\' 6"  (1.676 m), weight 115.8 kg, SpO2 97%. Body mass index is 41.22 kg/m.  ASSESSMENT: ELEESHA PURKEY is an 38 y.o. female who  has a past medical history of Anxiety, Back spasm, Bipolar I disorder (HCC), Borderline personality disorder (HCC), Cluster B personality disorder (HCC), Hypertension, Somatic symptom disorder, and Tachycardia.  She presented on 12/09/2023  2:37 PM for Bipolar 1 disorder (HCC).  Diagnoses / Active Problems: Patient Active Problem List   Diagnosis Date Noted   Nicotine dependence 12/10/2023   Bipolar 1 disorder (HCC) 12/09/2023   Bipolar I disorder, most recent episode depressed, severe without psychotic features (HCC) 08/12/2022   Anxiety 08/12/2022   Insomnia 08/12/2022   Essential hypertension 08/12/2022   Pilonidal cyst 06/01/2022   Carpal tunnel syndrome 01/30/2022   Sciatica of right side 10/29/2021   Ganglion cyst of flexor tendon sheath of finger, left 09/02/2020   Trigger finger, left middle finger 08/26/2020   Obesity (BMI 30.0-34.9) 12/07/2019   Prediabetes 12/07/2019   COVID-19 09/21/2019   Respiratory failure (HCC) 09/21/2019   Closed displaced fracture of neck of fifth metacarpal bone of left hand 08/03/2015   Tetrahydrocannabinol (THC) use disorder, moderate, dependence (HCC) 07/17/2015   Bipolar affective disorder, current episode manic with psychotic symptoms (HCC)  07/16/2015   Asthma 03/20/2009   PAIN IN JOINT, ANKLE AND FOOT 03/20/2009   Sprain of ankle 03/20/2009      PLAN: Safety and Monitoring:  -- Voluntary admission to inpatient psychiatric unit for safety, stabilization and treatment  -- Daily contact with patient to assess and evaluate symptoms and progress in treatment  -- Patient's case to be discussed in multi-disciplinary team meeting  -- Observation Level : q15 minute checks  -- Vital signs:  q12 hours  -- Precautions: suicide, elopement, and assault  2. Psychiatric Diagnoses and Treatment:  Patient Active Problem List   Diagnosis Date Noted   Nicotine dependence 12/10/2023   Bipolar 1 disorder (HCC) 12/09/2023   Bipolar I disorder, most recent episode depressed, severe without psychotic features (HCC) 08/12/2022   Anxiety 08/12/2022   Insomnia 08/12/2022   Essential hypertension 08/12/2022   Pilonidal cyst 06/01/2022   Carpal tunnel syndrome 01/30/2022   Sciatica of right side 10/29/2021   Ganglion cyst of flexor tendon sheath of finger, left 09/02/2020   Trigger finger, left middle finger 08/26/2020   Obesity (BMI 30.0-34.9) 12/07/2019   Prediabetes 12/07/2019   COVID-19 09/21/2019   Respiratory failure (HCC) 09/21/2019   Closed displaced fracture of neck of fifth metacarpal bone of left hand 08/03/2015   Tetrahydrocannabinol (THC) use disorder, moderate, dependence (HCC) 07/17/2015   Bipolar affective disorder, current episode manic with psychotic symptoms (HCC) 07/16/2015   Asthma 03/20/2009   PAIN IN JOINT, ANKLE AND FOOT 03/20/2009   Sprain of ankle 03/20/2009     Scheduled Medications:  lamoTRIgine  100 mg Oral Once   [START ON 12/12/2023] lamoTRIgine  150 mg Oral Daily   memantine  10 mg Oral BID   metFORMIN  500 mg Oral Q breakfast   QUEtiapine  200 mg Oral QHS   topiramate  50 mg Oral BID   Vitamin D (Ergocalciferol)  50,000 Units Oral Q7 days   [START ON 12/12/2023] cholecalciferol  2,000 Units Oral Daily     As Needed Medications: acetaminophen, alum & mag hydroxide-simeth, haloperidol **AND** diphenhydrAMINE, haloperidol lactate **AND** diphenhydrAMINE **AND** LORazepam, haloperidol lactate **AND** diphenhydrAMINE **AND** LORazepam, hydrOXYzine, LORazepam, magnesium hydroxide, nicotine polacrilex, traZODone    3. 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 Total cholesterol is mildly elevated at 209 mg/dL, VLDL is 73 mg/dL, and HDL is suboptimal at 44 mg/dL.  Lifestyle modifications recommended.  Vitamin D replacement as above.   Tobacco Use Disorder  --  Nicotine gum as above  -- Smoking cessation encouraged  4. Discharge Planning:   -- Social work and case management to assist with discharge planning and identification of hospital follow-up needs prior to discharge  -- Estimated LOS: Discharge Sunday 4/6 to home  -- Discharge Concerns: Need to establish a safety plan; Medication compliance and effectiveness  -- Discharge Goals: Return home with outpatient referrals for mental health follow-up including medication management/psychotherapy  5. Short Term Goals:  Improve ability to identify changes in lifestyle to reduce recurrence of condition, verbalize feelings, disclose and discuss suicidal ideas, demonstrate self-control, identify and develop effective coping behaviors, compliance with prescribed medications, identify triggers associated with substance abuse/mental health issues, participate in unit milieu and in scheduled group therapies   6. Long Term Goals: Improvement in symptoms so the patient is ready for discharge   --The risks/benefits/side-effects/alternatives to the medications above were discussed in detail with the patient and time was given for questions.  The patient provided informed consent.   -- Metabolic profile and EKG monitoring obtained while on an atypical antipsychotic and listed in the EHR    Total Time Spent in Direct Patient Care:  I personally spent 35 minutes on the unit in direct patient care. The direct patient care time included face-to-face time with the patient, reviewing the patient's chart, communicating with other professionals, and coordinating care. Greater than 50% of this time was spent in counseling or coordinating care with the patient regarding goals of hospitalization, psycho-education, and discharge planning needs.      Criss Alvine, MD Psychiatrist  12/11/2023, 10:48 AM   I certify that inpatient services furnished can reasonably be expected to improve the patient's condition.    Portions of this note were created using voice recognition software. Minor syntax errors, grammatical content, spelling, or punctuation errors may have occurred unintentionally. Please notify the Thereasa Parkin if the meaning of any statement is unclear.

## 2023-12-11 NOTE — Progress Notes (Signed)
   12/10/23 2200  Psych Admission Type (Psych Patients Only)  Admission Status Voluntary  Psychosocial Assessment  Patient Complaints Anxiety (anxiety 5/10)  Eye Contact Fair  Facial Expression Anxious  Affect Appropriate to circumstance  Speech Logical/coherent  Interaction Assertive  Motor Activity Other (Comment) (WDL)  Appearance/Hygiene Unremarkable  Behavior Characteristics Cooperative  Mood Anxious;Pleasant  Thought Process  Coherency WDL  Content WDL  Delusions None reported or observed  Perception WDL  Hallucination None reported or observed  Judgment Impaired  Confusion None  Danger to Self  Current suicidal ideation? Denies  Self-Injurious Behavior No self-injurious ideation or behavior indicators observed or expressed   Agreement Not to Harm Self Yes  Description of Agreement Verbal  Danger to Others  Danger to Others None reported or observed

## 2023-12-11 NOTE — Group Note (Signed)
 Date:  12/11/2023 Time:  9:13 AM  Group Topic/Focus:  Goals Group:   The focus of this group is to help patients establish daily goals to achieve during treatment and discuss how the patient can incorporate goal setting into their daily lives to aide in recovery. Orientation:   The focus of this group is to educate the patient on the purpose and policies of crisis stabilization and provide a format to answer questions about their admission.  The group details unit policies and expectations of patients while admitted.    Participation Level:  Active  Participation Quality:  Appropriate  Affect:  Appropriate  Cognitive:  Appropriate  Insight: Appropriate  Engagement in Group:    Modes of Intervention:  Discussion, Orientation, and Socialization  Additional Comments:  Goal is to stay awake  Kara Stephens 12/11/2023, 9:13 AM

## 2023-12-11 NOTE — [facility] Group Notes (Signed)
 Adult Psychoeducational Group Note  Date:  12/11/2023 Time:  9:42 PM  Group Topic/Focus:  Wrap-Up Group:   The focus of this group is to help patients review their daily goal of treatment and discuss progress on daily workbooks.  Participation Level:  Active  Participation Quality:  Appropriate  Affect:  Appropriate  Cognitive:  Appropriate  Insight: Appropriate  Engagement in Group:  Engaged  Modes of Intervention:  Discussion  Additional Comments:  Pt attended group.  Joselyn Arrow 12/11/2023, 9:42 PM

## 2023-12-11 NOTE — Group Note (Signed)
 LCSW Group Therapy Note  Group Date: 12/11/2023 Start Time: 1000 End Time: 1100   Type of Therapy and Topic:  Group Therapy: Positive Affirmations  Participation Level:  Active   Description of Group:   This group addressed positive affirmation towards self and others.  Patients went around the room and identified two positive things about themselves and two positive things about a peer in the room.  Patients reflected on how it felt to share something positive with others, to identify positive things about themselves, and to hear positive things from others/ Patients were encouraged to have a daily reflection of positive characteristics or circumstances.   Therapeutic Goals: Patients will verbalize two of their positive qualities Patients will demonstrate empathy for others by stating two positive qualities about a peer in the group Patients will verbalize their feelings when voicing positive self affirmations and when voicing positive affirmations of others Patients will discuss the potential positive impact on their wellness/recovery of focusing on positive traits of self and others.  Summary of Patient Progress:  She actively engaged in the discussion and . She was able to identify positive affirmations about herself as well as other group members. Patient demonstrated  insight into the subject matter, was respectful of peers, participated throughout the entire session.  Therapeutic Modalities:   Cognitive Behavioral Therapy Motivational Interviewing    Steffanie Dunn, Theresia Majors 12/11/2023  5:58 PM

## 2023-12-11 NOTE — Progress Notes (Signed)
   12/11/23 0910  Psych Admission Type (Psych Patients Only)  Admission Status Voluntary  Psychosocial Assessment  Patient Complaints Anxiety  Eye Contact Fair  Facial Expression Anxious  Affect Appropriate to circumstance  Speech Logical/coherent  Interaction Assertive  Motor Activity Other (Comment) (WNL)  Appearance/Hygiene Unremarkable  Behavior Characteristics Cooperative  Mood Anxious;Pleasant  Thought Process  Coherency WDL  Content WDL  Delusions None reported or observed  Perception WDL  Hallucination None reported or observed  Judgment Impaired  Confusion None  Danger to Self  Current suicidal ideation? Denies  Agreement Not to Harm Self Yes  Description of Agreement Verbal  Danger to Others  Danger to Others None reported or observed

## 2023-12-11 NOTE — Plan of Care (Signed)
   Problem: Education: Goal: Emotional status will improve Outcome: Progressing Goal: Mental status will improve Outcome: Progressing   Problem: Activity: Goal: Interest or engagement in activities will improve Outcome: Progressing Goal: Sleeping patterns will improve Outcome: Progressing   Problem: Safety: Goal: Periods of time without injury will increase Outcome: Progressing

## 2023-12-12 DIAGNOSIS — F319 Bipolar disorder, unspecified: Secondary | ICD-10-CM

## 2023-12-12 LAB — T3, FREE: T3, Free: 3.2 pg/mL (ref 2.0–4.4)

## 2023-12-12 MED ORDER — VITAMIN D (ERGOCALCIFEROL) 1.25 MG (50000 UNIT) PO CAPS: 50000.0000 [IU] | ORAL_CAPSULE | ORAL | 0 refills | Status: DC

## 2023-12-12 MED ORDER — VITAMIN D3 25 MCG PO TABS: 2000.0000 [IU] | ORAL_TABLET | Freq: Every day | ORAL | 0 refills | Status: DC

## 2023-12-12 MED ORDER — NICOTINE 21 MG/24HR TD PT24: 21.0000 mg | MEDICATED_PATCH | Freq: Every day | TRANSDERMAL | 0 refills | Status: DC

## 2023-12-12 MED ORDER — NICOTINE POLACRILEX 2 MG MT GUM: 2.0000 mg | CHEWING_GUM | OROMUCOSAL | 0 refills | Status: AC | PRN

## 2023-12-12 MED ORDER — QUETIAPINE FUMARATE ER 200 MG PO TB24: 200.0000 mg | ORAL_TABLET | Freq: Every day | ORAL | 0 refills | Status: DC

## 2023-12-12 MED ORDER — TRAZODONE HCL 100 MG PO TABS: 100.0000 mg | ORAL_TABLET | Freq: Every evening | ORAL | 0 refills | Status: DC | PRN

## 2023-12-12 MED ORDER — LAMOTRIGINE 150 MG PO TABS: 150.0000 mg | ORAL_TABLET | Freq: Every day | ORAL | 0 refills | Status: DC

## 2023-12-12 NOTE — Group Note (Signed)
 Date:  12/12/2023 Time:  9:27 AM  Group Topic/Focus:  Goals Group:   The focus of this group is to help patients establish daily goals to achieve during treatment and discuss how the patient can incorporate goal setting into their daily lives to aide in recovery. Orientation:   The focus of this group is to educate the patient on the purpose and policies of crisis stabilization and provide a format to answer questions about their admission.  The group details unit policies and expectations of patients while admitted.    Participation Level:  Active  Participation Quality:  Appropriate  Affect:  Appropriate  Cognitive:  Appropriate  Insight: Appropriate  Engagement in Group:  Engaged  Modes of Intervention:  Discussion and Orientation  Additional Comments:    Azalee Course 12/12/2023, 9:27 AM

## 2023-12-12 NOTE — Progress Notes (Signed)
   12/12/23 2000  Psych Admission Type (Psych Patients Only)  Admission Status Voluntary  Psychosocial Assessment  Patient Complaints None  Eye Contact Fair  Facial Expression Anxious  Affect Appropriate to circumstance  Speech Logical/coherent  Interaction Assertive  Motor Activity Other (Comment) (WDL)  Appearance/Hygiene Unremarkable  Behavior Characteristics Cooperative;Appropriate to situation  Mood Pleasant  Thought Process  Coherency WDL  Content WDL  Delusions None reported or observed  Perception WDL  Hallucination None reported or observed  Judgment Impaired  Confusion None  Danger to Self  Current suicidal ideation? Denies  Self-Injurious Behavior No self-injurious ideation or behavior indicators observed or expressed   Agreement Not to Harm Self Yes  Description of Agreement Verbal  Danger to Others  Danger to Others None reported or observed

## 2023-12-12 NOTE — Progress Notes (Addendum)
 [facility] MD Progress Note  12/12/2023 2:50 PM Kara Stephens  MRN:  [id number] Subjective:   Kara Stephens is an 38 y.o. female who  has a past medical history of Anxiety, Back spasm, Bipolar I disorder (HCC), Borderline personality disorder (HCC), Cluster B personality disorder (HCC), Hypertension, Somatic symptom disorder, and Tachycardia.  She presented on 12/09/2023  2:37 PM for Bipolar 1 disorder (HCC). Patient initially arrived to [facility] on 12/08/23 for what was believed to be acute mania (now more likely thought to be anxious distress), and admitted to [facility] on 12/09/2023 for crisis stabalization and intensive therapeutic interventions.    Case was discussed in the multidisciplinary team. MAR was reviewed and patient was compliant with medications.  She received PRN Tylenol and Trazodone.   Psychiatric Team made the following recommendations yesterday: -Increase Lamictal to 150 mg daily     On interview today patient reports she slept good last night.  She reports her appetite is doing good.  She reports no SI, HI, or AVH.  She reports no Paranoia or Ideas of Reference.  She reports no issues with her medications.  She reports that her mood was much more stable and that she was feeling much better because she was on the "correct" medications.  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  Asked if she had any rash which she denied.  Discussed with her that if a rash is noticed she needs to immediately let staff know or be seen in emergency room because it could be fatal and she reported understanding.  She reported no other concerns at present.  Update: Notified by social work that patient's husband had safety concerns about discharge.  Called patient's husband, Kara Stephens, [phone].  He reports that he does not think patient is back to baseline yet.  He reports that she has minimized her symptoms in the  past.  He reports that the last time she was discharged from the hospital she got into conversations with their children.  He reports that when he visited her last night she was still not herself.  Discussed with him that we would hold discharge and encouraged him to visit again tonight so that he could provide an update to [place] tomorrow.  He reports other concerns at present.  Principal Problem: Bipolar 1 disorder (HCC) Diagnosis: Principal Problem:   Bipolar 1 disorder (HCC) Active Problems:   Nicotine dependence  Total Time spent with patient:  I personally spent 35 minutes on the unit in direct patient care. The direct patient care time included face-to-face time with the patient, reviewing the patient's chart, communicating with other professionals, and coordinating care. Greater than 50% of this time was spent in counseling or coordinating care with the patient regarding goals of hospitalization, psycho-education, and discharge planning needs.   Past Psychiatric History:      Past Medical History:  Diagnosis Date   Anxiety     Back spasm     Bipolar I disorder (HCC)     Borderline personality disorder (HCC)     Cluster B personality disorder (HCC)     Hypertension     Somatic symptom disorder     Tachycardia           Past Medical History:  Past Medical History:  Diagnosis Date   Anxiety    Back spasm    Bipolar I disorder (HCC)    Borderline personality disorder (  HCC)    Class 3 obesity    Cluster B personality disorder (HCC)    Hypercholesterolemia    Hypertension    Hypertriglyceridemia    Somatic symptom disorder    Tachycardia    Type 2 diabetes mellitus (HCC)    Vitamin D insufficiency     Past Surgical History:  Procedure Laterality Date   APPENDECTOMY     CHOLECYSTECTOMY     HAND SURGERY     SHOULDER SURGERY     Family History:  Family History  Problem Relation Age of Onset   Hypertension Mother    Hyperlipidemia Mother    Suicidality Father     Depression Father    Family Psychiatric  History:  Family History  Problem Relation Age of Onset   Hypertension Mother     Hyperlipidemia Mother     Suicidality Father     Depression Father    [] Expa  Social History:  Social History   Substance and Sexual Activity  Alcohol Use No     Social History   Substance and Sexual Activity  Drug Use No    Social History   Socioeconomic History   Marital status: Married    Spouse name: Not on file   Number of children: Not on file   Years of education: Not on file   Highest education level: Not on file  Occupational History   Not on file  Tobacco Use   Smoking status: Every Day    Current packs/day: 0.50    Average packs/day: 0.5 packs/day for 5.1 years (2.5 ttl pk-yrs)    Types: Cigarettes    Start date: 11/2018    Last attempt to quit: 09/10/2016   Smokeless tobacco: Never  Vaping Use   Vaping status: Never Used  Substance and Sexual Activity   Alcohol use: No   Drug use: No   Sexual activity: Not on file  Other Topics Concern   Not on file  Social History Narrative   Not on file   Social Drivers of Health   Financial Resource Strain: Not on file  Food Insecurity: No Food Insecurity (12/09/2023)   Hunger Vital Sign    Worried About Running Out of Food in the Last Year: Never true    Ran Out of Food in the Last Year: Never true  Recent Concern: Food Insecurity - Food Insecurity Present (11/16/2023)   Received from [facility]   Hunger Vital Sign    Worried About Running Out of Food in the Last Year: Sometimes true    Ran Out of Food in the Last Year: Sometimes true  Transportation Needs: No Transportation Needs (12/09/2023)   PRAPARE - Administrator, Civil Service (Medical): No    Lack of Transportation (Non-Medical): No  Recent Concern: Transportation Needs - Unmet Transportation Needs (12/07/2023)   PRAPARE - Transportation    Lack of Transportation (Medical): Yes    Lack of Transportation  (Non-Medical): Yes  Physical Activity: Not on file  Stress: Stress Concern Present (11/16/2023)   Received from [facility] - Occupational Stress Questionnaire    Feeling of Stress : Very much  Social Connections: Unknown (01/10/2022)   Received from [facility]   Social Network    Social Network: Not on file   Additional Social History:                         Sleep:  Good  Appetite:  Good  Current Medications: Current Facility-Administered Medications  Medication Dose Route Frequency Provider Last Rate Last Admin   acetaminophen (TYLENOL) tablet 650 mg  650 mg Oral Q6H PRN Howie Ill, NP   650 mg at 12/12/23 0813   alum & mag hydroxide-simeth (MAALOX/MYLANTA) 200-200-20 MG/5ML suspension 30 mL  30 mL Oral Q4H PRN Howie Ill, NP       haloperidol (HALDOL) tablet 5 mg  5 mg Oral TID PRN Howie Ill, NP       And   diphenhydrAMINE (BENADRYL) capsule 50 mg  50 mg Oral TID PRN Howie Ill, NP       haloperidol lactate (HALDOL) injection 5 mg  5 mg Intramuscular TID PRN Howie Ill, NP       And   diphenhydrAMINE (BENADRYL) injection 50 mg  50 mg Intramuscular TID PRN Howie Ill, NP       And   LORazepam (ATIVAN) injection 2 mg  2 mg Intramuscular TID PRN Howie Ill, NP       haloperidol lactate (HALDOL) injection 10 mg  10 mg Intramuscular TID PRN Howie Ill, NP       And   diphenhydrAMINE (BENADRYL) injection 50 mg  50 mg Intramuscular TID PRN Howie Ill, NP       And   LORazepam (ATIVAN) injection 2 mg  2 mg Intramuscular TID PRN Howie Ill, NP       hydrOXYzine (ATARAX) tablet 25 mg  25 mg Oral TID PRN Howie Ill, NP       lamoTRIgine (LAMICTAL) tablet 150 mg  150 mg Oral Daily Golda Acre, MD   150 mg at 12/12/23 0810   LORazepam (ATIVAN) injection 2 mg  2 mg Intramuscular Once PRN Meryl Dare, MD       magnesium hydroxide (MILK OF MAGNESIA) suspension 30 mL  30 mL  Oral Daily PRN Howie Ill, NP       memantine (NAMENDA) tablet 10 mg  10 mg Oral BID Gaylyn Rong C, NP   10 mg at 12/12/23 1610   metFORMIN (GLUCOPHAGE-XR) 24 hr tablet 500 mg  500 mg Oral Q breakfast Gaylyn Rong C, NP   500 mg at 12/12/23 9604   nicotine polacrilex (NICORETTE) gum 2 mg  2 mg Oral PRN Golda Acre, MD       QUEtiapine (SEROQUEL XR) 24 hr tablet 200 mg  200 mg Oral QHS Meryl Dare, MD   200 mg at 12/11/23 2121   topiramate (TOPAMAX) tablet 50 mg  50 mg Oral BID Gaylyn Rong C, NP   50 mg at 12/12/23 5409   traZODone (DESYREL) tablet 100 mg  100 mg Oral QHS PRN Onuoha, Chinwendu V, NP   100 mg at 12/11/23 2122   Vitamin D (Ergocalciferol) (DRISDOL) 1.25 MG (50000 UNIT) capsule 50,000 Units  50,000 Units Oral Q7 days Golda Acre, MD   50,000 Units at 12/11/23 8119   vitamin D3 (CHOLECALCIFEROL) tablet 2,000 Units  2,000 Units Oral Daily Golda Acre, MD   2,000 Units at 12/12/23 1478    Lab Results:  Results for orders placed or performed during the hospital encounter of 12/09/23 (from the past 48 hours)  T3, free     Status: None   Collection Time: 12/10/23  6:30 PM  Result Value Ref Range   T3, Free 3.2 2.0 - 4.4 pg/mL    Comment: (NOTE)  Performed At: [facility] [address] [place], [place] [id number] Jolene Schimke MD WU:[id number]   T4, free     Status: None   Collection Time: 12/10/23  6:30 PM  Result Value Ref Range   Free T4 0.82 0.61 - 1.12 ng/dL    Comment: (NOTE) Biotin ingestion may interfere with free T4 tests. If the results are inconsistent with the TSH level, previous test results, or the clinical presentation, then consider biotin interference. If needed, order repeat testing after stopping biotin. Performed at [facility], [address], [place], [place] [address]   Folate     Status: None   Collection Time: 12/10/23  6:30 PM  Result Value Ref Range   Folate 20.0 >5.9 ng/mL    Comment: Performed at [facility], [address] [place], [place] [address]  RPR     Status: None   Collection Time: 12/10/23  6:30 PM  Result Value Ref Range   RPR Ser Ql NON REACTIVE NON REACTIVE    Comment: Performed at [facility], [address] [place], [place] [address]  Vitamin B12     Status: None   Collection Time: 12/10/23  6:30 PM  Result Value Ref Range   Vitamin B-12 314 180 - 914 pg/mL    Comment: (NOTE) This assay is not validated for testing neonatal or myeloproliferative syndrome specimens for Vitamin B12 levels. Performed at [facility], [address] [place], [place] [address]   VITAMIN D 25 Hydroxy (Vit-D Deficiency, Fractures)     Status: Abnormal   Collection Time: 12/10/23  6:30 PM  Result Value Ref Range   Vit D, 25-Hydroxy 21.31 (L) 30 - 100 ng/mL    Comment: (NOTE) Vitamin D deficiency has been defined by the Institute of Medicine  and an Endocrine Society practice guideline as a level of serum 25-OH  vitamin D less than 20 ng/mL (1,2). The Endocrine Society went on to  further define vitamin D insufficiency as a level between 21 and 29  ng/mL (2).  1. IOM (Institute of Medicine). 2010. Dietary reference intakes for  calcium and D. [place]: [employer]. 2. Holick MF, Binkley [place], Bischoff-Ferrari HA, et al. Evaluation,  treatment, and prevention of vitamin D deficiency: an Endocrine  Society clinical practice guideline, JCEM. 2011 Jul; 96(7): 1911-30.  Performed at [facility], [address], [place], [place] [address]   HIV Antibody (routine testing w rflx)     Status: None   Collection Time: 12/10/23  6:30 PM  Result Value Ref Range   HIV Screen 4th Generation wRfx Non Reactive Non Reactive    Comment: Performed at [facility], [address], [place], [place] [address]    Blood Alcohol level:  Lab Results  Component Value Date   [facility] <10 08/10/2022    Metabolic Disorder Labs: Lab  Results  Component Value Date   HGBA1C 5.8 (H) 12/07/2023   MPG 119.76 12/07/2023   MPG 120 08/10/2022   No results found for: "PROLACTIN" Lab Results  Component Value Date   CHOL 209 (H) 12/07/2023   TRIG 367 (H) 12/07/2023   HDL 44 12/07/2023   CHOLHDL 4.8 12/07/2023   VLDL 73 (H) 12/07/2023   LDLCALC 92 12/07/2023   LDLCALC 121 (H) 08/12/2022    Physical Findings: AIMS:  , ,  ,  ,    CIWA:    COWS:  Musculoskeletal: Strength & Muscle Tone: within normal limits Gait & Station: normal Patient leans: N/A  Psychiatric Specialty Exam:  Presentation  General Appearance:  Appropriate for Environment; Casual  Eye Contact: Good  Speech: Clear and Coherent; Normal Rate  Speech Volume: Normal  Handedness: Right   Mood and Affect  Mood: Euthymic  Affect: Appropriate; Congruent   Thought Process  Thought Processes: Coherent; Linear; Goal Directed  Descriptions of Associations:Intact  Orientation:Full (Time, Place and Person)  Thought Content:Logical  History of Schizophrenia/Schizoaffective disorder:No  Duration of Psychotic Symptoms:N/A  Hallucinations:Hallucinations: None  Ideas of Reference:None  Suicidal Thoughts:Suicidal Thoughts: No  Homicidal Thoughts:Homicidal Thoughts: No   Sensorium  Memory: Immediate Good; Recent Good  Judgment: Good  Insight: Good   Executive Functions  Concentration: Good  Attention Span: Good  Recall: Good  Fund of Knowledge: Good  Language: Good   Psychomotor Activity  Psychomotor Activity: Psychomotor Activity: Normal   Assets  Assets: Communication Skills; Desire for Improvement; Resilience; Social Support   Sleep  Sleep: Sleep: Good    Physical Exam: Physical Exam Vitals and nursing note reviewed.  Constitutional:      General: She is not in acute distress.    Appearance: Normal appearance. She is obese. She is not ill-appearing or toxic-appearing.  HENT:      Head: Normocephalic and atraumatic.  Pulmonary:     Effort: Pulmonary effort is normal.  Musculoskeletal:        General: Normal range of motion.  Neurological:     General: No focal deficit present.     Mental Status: She is alert.    Review of Systems  Respiratory:  Negative for cough and shortness of breath.   Cardiovascular:  Negative for chest pain.  Gastrointestinal:  Negative for abdominal pain, constipation, diarrhea, nausea and vomiting.  Neurological:  Negative for dizziness, weakness and headaches.  Psychiatric/Behavioral:  Negative for depression, hallucinations and suicidal ideas. The patient is not nervous/anxious.    Blood pressure (!) 119/92, pulse 95, temperature 98.3 F (36.8 C), temperature source Oral, resp. rate 18, height 5\' 6"  (1.676 m), weight 115.8 kg, SpO2 98%. Body mass index is 41.22 kg/m.   Treatment Plan Summary: Daily contact with patient to assess and evaluate symptoms and progress in treatment and Medication management  Kara Stephens is an 38 y.o. female who  has a past medical history of Anxiety, Back spasm, Bipolar I disorder (HCC), Borderline personality disorder (HCC), Cluster B personality disorder (HCC), Hypertension, Somatic symptom disorder, and Tachycardia.  She presented on 12/09/2023  2:37 PM for Bipolar 1 disorder (HCC). Patient initially arrived to [facility] on 12/08/23 for what was believed to be acute mania (now more likely thought to be anxious distress), and admitted to [facility] on 12/09/2023 for crisis stabalization and intensive therapeutic interventions.    Kara Stephens appears to be responding well to her medication changes without side effect and specifically denies any rash.  The plan had been to discharge today, however, patient's husband did have safety concerns given her past history of minimizing symptoms and after visitation last night.  We will not discharge her at this time.  Encouraged her husband to visit again tonight to be able  to continue to give [place] updates.  We will not make any changes to her medications at this time.  We will continue to monitor.   Bipolar Disorder 1: -Continue Lamictal 150 mg daily for mood stability -Continue Seroquel 200 mg QHS for mood stability -Continue Topamax 50 mg BID  for mood stability -Continue Agitation Protocol: Haldol/Ativan/Benadryl   -Continue Namenda 10 mg BID -Continue Metformin 500 mg daily -Continue Vit D3 2000 units daily for deficiency -Continue Vit D 50000 units weekly for deficiency -Continue PRN's: Tylenol, Maalox, Atarax, Milk of Magnesia, Trazodone    Medical Issues Being Addressed:              -- class III obesity (BMI 41.22), vitamin D insufficiency (level 21 ng/mL), hypertriglyceridemia (TG 367 mg/dL), W1X of 9.1% indicating prediabetes, and subclinical hypothyroidism (TSH 4.508 with low-normal free T4 at 0.82 ng/dL). Total cholesterol is mildly elevated at 209 mg/dL, VLDL is 73 mg/dL, and HDL is suboptimal at 44 mg/dL.  Lifestyle modifications recommended.  Vitamin D replacement as above.               Tobacco Use Disorder             -- Nicotine gum as above             -- Smoking cessation encouraged   Discharge Planning:              -- Social work and case management to assist with discharge planning and identification of hospital follow-up needs prior to discharge             -- Estimated LOS: Discharge Sunday 4/6 to home             -- Discharge Concerns: Need to establish a safety plan; Medication compliance and effectiveness             -- Discharge Goals: Return home with outpatient referrals for mental health follow-up including medication management/psychotherapy   Short Term Goals:  Improve ability to identify changes in lifestyle to reduce recurrence of condition, verbalize feelings, disclose and discuss suicidal ideas, demonstrate self-control, identify and develop effective coping behaviors, compliance with prescribed medications, identify  triggers associated with substance abuse/mental health issues, participate in unit milieu and in scheduled group therapies    Long Term Goals: Improvement in symptoms so the patient is ready for discharge     --The risks/benefits/side-effects/alternatives to the medications above were discussed in detail with the patient and time was given for questions. The patient provided informed consent.              -- Metabolic profile and EKG monitoring obtained while on an atypical antipsychotic and listed in the EHR     Lauro Franklin, MD 12/12/2023, 2:50 PM

## 2023-12-12 NOTE — Progress Notes (Signed)
 Adult Psychoeducational Group Note  Date:  12/12/2023 Time:  9:34 PM  Group Topic/Focus:  Wrap-Up Group:   The focus of this group is to help patients review their daily goal of treatment and discuss progress on daily workbooks.  Participation Level:  Active  Participation Quality:  Appropriate  Affect:  Appropriate  Cognitive:  Appropriate  Insight: Appropriate  Engagement in Group:  Engaged  Modes of Intervention:  Discussion  Additional Comments:  Jaleia gtoal to sleep well and she took a nap today  Chauncey Fischer 12/12/2023, 9:34 PM

## 2023-12-12 NOTE — Progress Notes (Signed)
   12/11/23 2000  Psych Admission Type (Psych Patients Only)  Admission Status Voluntary  Psychosocial Assessment  Patient Complaints Anxiety (anxiety 5/10)  Eye Contact Fair  Facial Expression Anxious  Affect Appropriate to circumstance  Speech Logical/coherent  Interaction Assertive  Motor Activity Other (Comment) (WDL)  Appearance/Hygiene Unremarkable  Behavior Characteristics Cooperative  Mood Anxious;Pleasant  Thought Process  Coherency WDL  Content WDL  Delusions None reported or observed  Perception WDL  Hallucination None reported or observed  Judgment Impaired  Confusion None  Danger to Self  Current suicidal ideation? Denies  Self-Injurious Behavior No self-injurious ideation or behavior indicators observed or expressed   Agreement Not to Harm Self Yes  Description of Agreement Verbal  Danger to Others  Danger to Others None reported or observed

## 2023-12-12 NOTE — Group Note (Signed)
 Date:  12/12/2023 Time:  9:51 AM  Group Topic/Focus:  The topic for this group is mindful breathing    Participation Level:  Active  Participation Quality:  Appropriate

## 2023-12-12 NOTE — Plan of Care (Signed)
 Nurse discussed anxiety, depression and coping skills with patient.

## 2023-12-12 NOTE — Plan of Care (Signed)
   Problem: Education: Goal: Emotional status will improve Outcome: Progressing Goal: Mental status will improve Outcome: Progressing   Problem: Activity: Goal: Interest or engagement in activities will improve Outcome: Progressing Goal: Sleeping patterns will improve Outcome: Progressing   Problem: Safety: Goal: Periods of time without injury will increase Outcome: Progressing

## 2023-12-12 NOTE — Progress Notes (Signed)
 D:  Patient's self inventory sheet, patient sleeps good, no sleep medication.  Good appetite, normal energy level, good concentration.  Denied depression and hopeless, rated anxiety 4.  Denied withdrawals.  Denied SI.  Has had ligheadedness at times.  Physical pain, groin, left ovary, worst pain #4.  Tylenol administered.  Goal is work on anxiety.  Plans to attend groups.  No discharge plans. A:  Medications administered  per MD orders.  Emotional support and encouragement. R:  Denied SI and HI, contracts for safety.  Denied A/V hallucinations.  Safety maintained with 15 minute checks.

## 2023-12-12 NOTE — Plan of Care (Addendum)
 Patient  discussed her family life and stressors.

## 2023-12-13 ENCOUNTER — Encounter (HOSPITAL_COMMUNITY): Payer: Self-pay | Admitting: Psychiatry

## 2023-12-13 DIAGNOSIS — F319 Bipolar disorder, unspecified: Secondary | ICD-10-CM | POA: Diagnosis not present

## 2023-12-13 MED ORDER — PROPRANOLOL HCL ER 60 MG PO CP24
60.0000 mg | ORAL_CAPSULE | Freq: Every day | ORAL | Status: DC
  Administered 2023-12-14: 60 mg via ORAL
  Filled 2023-12-13 (×3): qty 1

## 2023-12-13 MED ORDER — PROPRANOLOL HCL 20 MG PO TABS
20.0000 mg | ORAL_TABLET | Freq: Once | ORAL | Status: AC
  Administered 2023-12-13: 20 mg via ORAL
  Filled 2023-12-13: qty 1
  Filled 2023-12-13: qty 2

## 2023-12-13 NOTE — Group Note (Signed)
 Date:  12/13/2023 Time:  9:54 PM  Group Topic/Focus:  Wrap-Up Group:   The focus of this group is to help patients review their daily goal of treatment and discuss progress on daily workbooks.    Participation Level:  Active  Participation Quality:  Appropriate  Affect:  Appropriate  Cognitive:  Appropriate  Insight: Appropriate  Engagement in Group:  Engaged  Modes of Intervention:  Education and Exploration  Additional Comments:  Patient attended and participated in group tonight.  She reports that today she learn about her medication.  She is not sure, but thinks she is going home tomorrow.  Lita Mains [facility] 12/13/2023, 9:54 PM

## 2023-12-13 NOTE — [facility] Suicide Risk Assessment (Signed)
 Suicide Risk Assessment  Discharge Assessment    [facility] Discharge Suicide Risk Assessment   Principal Problem: Bipolar 1 disorder [facility]) Discharge Diagnoses: Principal Problem:   Bipolar 1 disorder (HCC) Active Problems:   Nicotine dependence  Kara Stephens is a 38 y.o. female  with a past psychiatric history of bipolar 1 disorder, 2 hospitalizations 2025 for mania with/without psychotic features, history of catatonic features, history of postpartum psychosis. Patient initially arrived to [facility] on 12/08/23 for acute mania, and admitted to [facility] on 12/09/2023 for crisis stabalization and intensive therapeutic interventions. PMHx is significant for asthma, type 2 diabetes.   Hospital course: Pt presented for intensive medication management in setting of documented mania by pt/ED but appeared to be moreover anxious distress and irritability. Pt has failed numerous antipsychotics. Pt reports hx of response lamictal and is currently doing well on seroquel, so she was started on lamictal and titrated to previous effective dose of 150 mg prior to discharge and seroquel increased from 150 to 200. By discharge, pts mood and anxiety was significantly improved. Pt is continuing PHP at discharge at her outpatient provider office in [facility].   Total Time spent with patient: 1 hour  Musculoskeletal: Strength & Muscle Tone: within normal limits Gait & Station: normal Patient leans: N/A  Psychiatric Specialty Exam  Presentation  General Appearance:  Appropriate for Environment  Eye Contact: Good  Speech: Normal Rate  Speech Volume: Normal  Handedness: Right   Mood and Affect  Mood: Euthymic  Duration of Depression Symptoms: No data recorded Affect: Appropriate; Congruent; Full Range (laghing and smiling)   Thought Process  Thought Processes: Coherent; Linear  Descriptions of Associations:Intact  Orientation:Full (Time, Place and Person)  Thought  Content:Logical  History of Schizophrenia/Schizoaffective disorder:No  Duration of Psychotic Symptoms:N/A  Hallucinations:Hallucinations: None  Ideas of Reference:None  Suicidal Thoughts:Suicidal Thoughts: No  Homicidal Thoughts:Homicidal Thoughts: No   Sensorium  Memory: Immediate Good; Recent Good; Remote Good  Judgment: Good (plan to restart PHP)  Insight: Good   Executive Functions  Concentration: Good  Attention Span: Good  Recall: Good  Fund of Knowledge: Good  Language: Good   Psychomotor Activity  Psychomotor Activity: Psychomotor Activity: Normal   Assets  Assets: Desire for Improvement; Financial Resources/Insurance; Housing; Social Support; Resilience   Sleep  Sleep: Sleep: Good Number of Hours of Sleep: 8.25   Physical Exam Vitals and nursing note reviewed.  Pulmonary:     Effort: Pulmonary effort is normal.  Skin:    Comments: No rashes throughout hospitalization included discharge.  Neurological:     General: No focal deficit present.     Mental Status: She is alert.  Psychiatric:     Comments: No obvious EPS.       Review of Systems  Constitutional:  Negative for fever.  Cardiovascular:  Negative for chest pain and palpitations.  Gastrointestinal:  Negative for constipation, diarrhea, nausea and vomiting.  Skin:        Denies rashes throughout hospitalization including discharge.  Neurological:  Negative for dizziness, weakness and headaches.  Blood pressure 125/85, pulse 93, temperature 98.1 F (36.7 C), temperature source Oral, resp. rate 20, height 5\' 6"  (1.676 m), weight 115.8 kg, SpO2 97%. Body mass index is 41.22 kg/m.  Mental Status Per Nursing Assessment::   On Admission:  NA  Demographic Factors:  Caucasian  Loss Factors: NA  Historical Factors: Prior suicide attempts and Impulsivity  Risk Reduction Factors:   Responsible for children under 73 years of age,  Sense of responsibility to family, Living  with another person, especially a relative, Positive social support, Positive therapeutic relationship, and Positive coping skills or problem solving skills  Continued Clinical Symptoms:  None currently.   Cognitive Features That Contribute To Risk:  None    Suicide Risk:  Mild:  There are no identifiable suicide plans, no associated intent, mild dysphoria and related symptoms, good self-control (both objective and subjective assessment), few other risk factors, and identifiable protective factors, including available and accessible social support.   Follow-up Information     Services, [facility]. Go on 12/15/2023.   Why: A referral has been made to this provider for therapy and/or medication management services. Please go on 12/15/23 at 8:30 am. * Address: [address] [place], [place]. Contact information: [address] [place] [address] [phone]         [name], NP Follow up on 12/14/2023.   Why: Please call your provider to schedule an appointment, on 12/14/23 at 9:00 am. Contact information: [address] [place], [place] [address] Phone: [phone]                Plan Of Care/Follow-up recommendations:  Activity: as tolerated   Diet: heart healthy   Other: -Follow-up with your outpatient psychiatric provider -instructions on appointment date, time, and address (location) are provided to you in discharge paperwork.   -Take your psychiatric medications as prescribed at discharge - instructions are provided to you in the discharge paperwork   -Follow-up with outpatient primary care doctor and other specialists -for management of chronic medical disease, including: Follow-up on obstructive sleep apnea workup   -Testing: Follow-up with outpatient provider for abnormal lab results: None   -Recommend abstinence from alcohol, tobacco, and other illicit drug use at discharge.    -If your psychiatric symptoms recur,  worsen, or if you have side effects to your psychiatric medications, call your outpatient psychiatric provider, 911, [phone] or go to the nearest emergency department.   -If suicidal thoughts recur, call your outpatient psychiatric provider, 911, [phone] or go to the nearest emergency department.   -Immediately go to the emergency department with any large rash that develops   -Make sure to take the Lamictal every day and did not miss any doses if he missed more than 2 doses please contact her outpatient psychiatrist   Meryl Dare, MD PGY-1 Psychiatry Resident 12/14/2023, 10:27 AM

## 2023-12-13 NOTE — [facility] Group Notes (Signed)
 Spiritual care group on grief and loss facilitated by Chaplain Dyanne Carrel, B[facility]  Group Goal: Support / Education around grief and loss  Members engage in facilitated group support and psycho-social education.  Group Description:  Following introductions and group rules, group members engaged in facilitated group dialogue and support around topic of loss, with particular support around experiences of loss in their lives. Group Identified types of loss (relationships / self / things) and identified patterns, circumstances, and changes that precipitate losses. Reflected on thoughts / feelings around loss, normalized grief responses, and recognized variety in grief experience. Group encouraged individual reflection on safe space and on the coping skills that they are already utilizing.  Group drew on Adlerian / Rogerian and narrative framework  Patient Progress: Kara Stephens attended group and actively engaged and participated in group conversation and activities.  Comments demonstrated good insight and contributed positively to the group conversation.

## 2023-12-13 NOTE — Progress Notes (Signed)
 [facility] MD Progress Note  12/13/2023 3:50 PM Kara Stephens  MRN:  [id number] Subjective:   Kara Stephens is an 38 y.o. female who  has a past medical history of Anxiety, Back spasm, Bipolar I disorder (HCC), Borderline personality disorder (HCC), Cluster B personality disorder (HCC), Hypertension, Somatic symptom disorder, and Tachycardia.  She presented on 12/09/2023  2:37 PM for Bipolar 1 disorder (HCC). Patient initially arrived to [facility] on 12/08/23 for what was believed to be acute mania (now more likely thought to be anxious distress), and admitted to [facility] on 12/09/2023 for crisis stabalization and intensive therapeutic interventions.    Case was discussed in the multidisciplinary team. MAR was reviewed and patient was compliant with medications.  She received PRN Tylenol and Trazodone.   Psychiatric Team made the following recommendations yesterday: No changes   On interview today patient reports she slept good last night.  She reports her appetite is doing good.  She reports no SI, HI, or AVH.  She reports no Paranoia or Ideas of Reference.  She reports no issues with her medications. She is asking to leave today and reports that she is at her baseline. She reports that the current medications have been effective for her.  We originally discussed discharge with the patient but then after discussion with husband and and mother regarding discharge planning, they discussed their concerns that patient may need to stay in the hospital longer.  They did not have any particular concerns other than being worried that the hospitalization was too short.  Agreed with patient to stay tonight and having husband come to visit and discharging her tomorrow if there are no major concerns from them.   Collateral, husband, Aleia Larocca, [phone]: Reported that he is concerned that the discharge is too early and that this concern is coming from himself, patient's mother, and patient psychiatrist.   Discussed that we would talk with the patient and determine if she feels like she was okay with staying 2 more days given that she feels she is at baseline and we do not feel that she has any concerning symptoms aside from the concerns from the family.  Discussed that we would let the husband visit her tonight and discharge her to morning if there are no concerns but otherwise we can keep her for observation and medication management for a few more day.   Principal Problem: Bipolar 1 disorder (HCC) Diagnosis: Principal Problem:   Bipolar 1 disorder (HCC) Active Problems:   Nicotine dependence  Total Time spent with patient:  I personally spent 35 minutes on the unit in direct patient care. The direct patient care time included face-to-face time with the patient, reviewing the patient's chart, communicating with other professionals, and coordinating care. Greater than 50% of this time was spent in counseling or coordinating care with the patient regarding goals of hospitalization, psycho-education, and discharge planning needs.   Past Psychiatric History:      Past Medical History:  Diagnosis Date   Anxiety     Back spasm     Bipolar I disorder (HCC)     Borderline personality disorder (HCC)     Cluster B personality disorder (HCC)     Hypertension     Somatic symptom disorder     Tachycardia           Past Medical History:  Past Medical History:  Diagnosis Date   Anxiety    Back spasm    Bipolar I disorder (HCC)  Borderline personality disorder (HCC)    Class 3 obesity    Cluster B personality disorder (HCC)    Hypercholesterolemia    Hypertension    Hypertriglyceridemia    Somatic symptom disorder    Tachycardia    Type 2 diabetes mellitus (HCC)    Vitamin D insufficiency     Past Surgical History:  Procedure Laterality Date   APPENDECTOMY     CHOLECYSTECTOMY     HAND SURGERY     SHOULDER SURGERY     Family History:  Family History  Problem Relation Age of  Onset   Hypertension Mother    Hyperlipidemia Mother    Suicidality Father    Depression Father    Family Psychiatric  History:  Family History  Problem Relation Age of Onset   Hypertension Mother     Hyperlipidemia Mother     Suicidality Father     Depression Father    [] Expa  Social History:  Social History   Substance and Sexual Activity  Alcohol Use No     Social History   Substance and Sexual Activity  Drug Use No    Social History   Socioeconomic History   Marital status: Married    Spouse name: Not on file   Number of children: Not on file   Years of education: Not on file   Highest education level: Not on file  Occupational History   Not on file  Tobacco Use   Smoking status: Every Day    Current packs/day: 0.50    Average packs/day: 0.5 packs/day for 5.1 years (2.6 ttl pk-yrs)    Types: Cigarettes    Start date: 11/2018    Last attempt to quit: 09/10/2016   Smokeless tobacco: Never  Vaping Use   Vaping status: Never Used  Substance and Sexual Activity   Alcohol use: No   Drug use: No   Sexual activity: Not on file  Other Topics Concern   Not on file  Social History Narrative   Not on file   Social Drivers of Health   Financial Resource Strain: Not on file  Food Insecurity: No Food Insecurity (12/09/2023)   Hunger Vital Sign    Worried About Running Out of Food in the Last Year: Never true    Ran Out of Food in the Last Year: Never true  Recent Concern: Food Insecurity - Food Insecurity Present (11/16/2023)   Received from [facility]   Hunger Vital Sign    Worried About Running Out of Food in the Last Year: Sometimes true    Ran Out of Food in the Last Year: Sometimes true  Transportation Needs: No Transportation Needs (12/09/2023)   PRAPARE - Administrator, Civil Service (Medical): No    Lack of Transportation (Non-Medical): No  Recent Concern: Transportation Needs - Unmet Transportation Needs (12/07/2023)   PRAPARE -  Transportation    Lack of Transportation (Medical): Yes    Lack of Transportation (Non-Medical): Yes  Physical Activity: Not on file  Stress: Stress Concern Present (11/16/2023)   Received from [facility] - Occupational Stress Questionnaire    Feeling of Stress : Very much  Social Connections: Unknown (01/10/2022)   Received from [facility]   Social Network    Social Network: Not on file   Additional Social History:  Sleep: Good  Appetite:  Good  Current Medications: Current Facility-Administered Medications  Medication Dose Route Frequency Provider Last Rate Last Admin   acetaminophen (TYLENOL) tablet 650 mg  650 mg Oral Q6H PRN Howie Ill, NP   650 mg at 12/12/23 1710   alum & mag hydroxide-simeth (MAALOX/MYLANTA) 200-200-20 MG/5ML suspension 30 mL  30 mL Oral Q4H PRN Howie Ill, NP       haloperidol (HALDOL) tablet 5 mg  5 mg Oral TID PRN Howie Ill, NP       And   diphenhydrAMINE (BENADRYL) capsule 50 mg  50 mg Oral TID PRN Howie Ill, NP       haloperidol lactate (HALDOL) injection 5 mg  5 mg Intramuscular TID PRN Howie Ill, NP       And   diphenhydrAMINE (BENADRYL) injection 50 mg  50 mg Intramuscular TID PRN Howie Ill, NP       And   LORazepam (ATIVAN) injection 2 mg  2 mg Intramuscular TID PRN Howie Ill, NP       haloperidol lactate (HALDOL) injection 10 mg  10 mg Intramuscular TID PRN Howie Ill, NP       And   diphenhydrAMINE (BENADRYL) injection 50 mg  50 mg Intramuscular TID PRN Howie Ill, NP       And   LORazepam (ATIVAN) injection 2 mg  2 mg Intramuscular TID PRN Howie Ill, NP       hydrOXYzine (ATARAX) tablet 25 mg  25 mg Oral TID PRN Howie Ill, NP       lamoTRIgine (LAMICTAL) tablet 150 mg  150 mg Oral Daily Golda Acre, MD   150 mg at 12/13/23 1610   LORazepam (ATIVAN) injection 2 mg  2 mg Intramuscular Once PRN  Meryl Dare, MD       magnesium hydroxide (MILK OF MAGNESIA) suspension 30 mL  30 mL Oral Daily PRN Howie Ill, NP       memantine (NAMENDA) tablet 10 mg  10 mg Oral BID Gaylyn Rong C, NP   10 mg at 12/13/23 1137   metFORMIN (GLUCOPHAGE-XR) 24 hr tablet 500 mg  500 mg Oral Q breakfast Gaylyn Rong C, NP   500 mg at 12/13/23 9604   nicotine polacrilex (NICORETTE) gum 2 mg  2 mg Oral PRN Golda Acre, MD       QUEtiapine (SEROQUEL XR) 24 hr tablet 200 mg  200 mg Oral QHS Meryl Dare, MD   200 mg at 12/12/23 2106   topiramate (TOPAMAX) tablet 50 mg  50 mg Oral BID Gaylyn Rong C, NP   50 mg at 12/13/23 0906   traZODone (DESYREL) tablet 100 mg  100 mg Oral QHS PRN Onuoha, Chinwendu V, NP   100 mg at 12/12/23 2106   Vitamin D (Ergocalciferol) (DRISDOL) 1.25 MG (50000 UNIT) capsule 50,000 Units  50,000 Units Oral Q7 days Golda Acre, MD   50,000 Units at 12/11/23 5409   vitamin D3 (CHOLECALCIFEROL) tablet 2,000 Units  2,000 Units Oral Daily Golda Acre, MD   2,000 Units at 12/13/23 8119    Lab Results:  No results found for this or any previous visit (from the past 48 hours).   Blood Alcohol level:  Lab Results  Component Value Date   ETH <10 08/10/2022    Metabolic Disorder Labs: Lab Results  Component Value Date   HGBA1C 5.8 (H) 12/07/2023   MPG  119.76 12/07/2023   MPG 120 08/10/2022   No results found for: "PROLACTIN" Lab Results  Component Value Date   CHOL 209 (H) 12/07/2023   TRIG 367 (H) 12/07/2023   HDL 44 12/07/2023   CHOLHDL 4.8 12/07/2023   VLDL 73 (H) 12/07/2023   LDLCALC 92 12/07/2023   LDLCALC 121 (H) 08/12/2022    Physical Findings: AIMS:  , ,  ,  ,    CIWA:    COWS:     Musculoskeletal: Strength & Muscle Tone: within normal limits Gait & Station: normal Patient leans: N/A  Psychiatric Specialty Exam:  Presentation  General Appearance:  Appropriate for Environment  Eye Contact: Good  Speech: Normal Rate  Speech  Volume: Normal  Handedness: Right   Mood and Affect  Mood: Euthymic  Affect: Appropriate; Congruent; Full Range (laghing and smiling)   Thought Process  Thought Processes: Coherent; Linear  Descriptions of Associations:Intact  Orientation:Full (Time, Place and Person)  Thought Content:Logical  History of Schizophrenia/Schizoaffective disorder:No  Duration of Psychotic Symptoms:N/A  Hallucinations:Hallucinations: None  Ideas of Reference:None  Suicidal Thoughts:Suicidal Thoughts: No  Homicidal Thoughts:Homicidal Thoughts: No   Sensorium  Memory: Immediate Good; Recent Good; Remote Good  Judgment: Good (plan to restart PHP)  Insight: Good   Executive Functions  Concentration: Good  Attention Span: Good  Recall: Good  Fund of Knowledge: Good  Language: Good   Psychomotor Activity  Psychomotor Activity: Psychomotor Activity: Normal   Assets  Assets: Desire for Improvement; Financial Resources/Insurance; Housing; Social Support; Resilience   Sleep  Sleep: Sleep: Good Number of Hours of Sleep: 8.25    Physical Exam: Physical Exam Vitals and nursing note reviewed.  Constitutional:      General: She is not in acute distress.    Appearance: Normal appearance. She is obese. She is not ill-appearing or toxic-appearing.  HENT:     Head: Normocephalic and atraumatic.  Pulmonary:     Effort: Pulmonary effort is normal.  Musculoskeletal:        General: Normal range of motion.  Neurological:     General: No focal deficit present.     Mental Status: She is alert.    Review of Systems  Respiratory:  Negative for cough and shortness of breath.   Cardiovascular:  Negative for chest pain.  Gastrointestinal:  Negative for abdominal pain, constipation, diarrhea, nausea and vomiting.  Neurological:  Negative for dizziness, weakness and headaches.  Psychiatric/Behavioral:  Negative for depression, hallucinations and suicidal ideas. The  patient is not nervous/anxious.    Blood pressure 136/87, pulse 93, temperature 98.5 F (36.9 C), temperature source Oral, resp. rate 20, height 5\' 6"  (1.676 m), weight 115.8 kg, SpO2 100%. Body mass index is 41.22 kg/m.   Treatment Plan Summary: Daily contact with patient to assess and evaluate symptoms and progress in treatment and Medication management  Kara Stephens is an 38 y.o. female who  has a past medical history of Anxiety, Back spasm, Bipolar I disorder (HCC), Borderline personality disorder (HCC), Cluster B personality disorder (HCC), Hypertension, Somatic symptom disorder, and Tachycardia.  She presented on 12/09/2023  2:37 PM for Bipolar 1 disorder (HCC). Patient initially arrived to [facility] on 12/08/23 for what was believed to be acute mania (now more likely thought to be anxious distress), and admitted to [facility] on 12/09/2023 for crisis stabalization and intensive therapeutic interventions.    Madysen appears to be responding well to her medication changes without side effect and specifically denies any rash.  The plan  had been to discharge today, however, patient's husband did have safety concerns given her past history of minimizing symptoms and after visitation last night.  We will not discharge her at this time.  Encouraged her husband to visit again tonight to be able to continue to give [place] updates.  We will not make any changes to her medications at this time.  We will continue to monitor.  Plan this morning was to discharge the patient this afternoon but given concern from family that hosptialization was short and that they want to visit her in person to see if she is back to baseline, we will hold her tonight and have her husband give an assessment tomorrow on how she is doing.  If there are no major concerns and patient is still having no medication side effects, we will discharge tomorrow morning.  Otherwise we will reassess and likely do more medication management and  keep patient for more days.   Bipolar Disorder 1: -Continue Lamictal 150 mg daily for mood stability -Continue Seroquel 200 mg QHS for mood stability -Continue Topamax 50 mg BID for mood stability -Continue Agitation Protocol: Haldol/Ativan/Benadryl   -Continue Namenda 10 mg BID -Continue Metformin 500 mg daily -Continue Vit D3 2000 units daily for deficiency -Continue Vit D 50000 units weekly for deficiency -Continue PRN's: Tylenol, Maalox, Atarax, Milk of Magnesia, Trazodone    Medical Issues Being Addressed:              -- class III obesity (BMI 41.22), vitamin D insufficiency (level 21 ng/mL), hypertriglyceridemia (TG 367 mg/dL), N2T of 5.5% indicating prediabetes, and subclinical hypothyroidism (TSH 4.508 with low-normal free T4 at 0.82 ng/dL). Total cholesterol is mildly elevated at 209 mg/dL, VLDL is 73 mg/dL, and HDL is suboptimal at 44 mg/dL.  Lifestyle modifications recommended.  Vitamin D replacement as above.               Tobacco Use Disorder             -- Nicotine gum as above             -- Smoking cessation encouraged   Discharge Planning:              -- Social work and case management to assist with discharge planning and identification of hospital follow-up needs prior to discharge             -- Estimated LOS: Discharge Sunday 4/6 to home             -- Discharge Concerns: Need to establish a safety plan; Medication compliance and effectiveness             -- Discharge Goals: Return home with outpatient referrals for mental health follow-up including medication management/psychotherapy   Short Term Goals:  Improve ability to identify changes in lifestyle to reduce recurrence of condition, verbalize feelings, disclose and discuss suicidal ideas, demonstrate self-control, identify and develop effective coping behaviors, compliance with prescribed medications, identify triggers associated with substance abuse/mental health issues, participate in unit milieu and in  scheduled group therapies    Long Term Goals: Improvement in symptoms so the patient is ready for discharge     --The risks/benefits/side-effects/alternatives to the medications above were discussed in detail with the patient and time was given for questions. The patient provided informed consent.              -- Metabolic profile and EKG monitoring obtained while on an atypical antipsychotic and listed  in the EHR     Meryl Dare, MD PGY-1 Psychiatry Resident 12/13/2023, 3:56 PM

## 2023-12-13 NOTE — Progress Notes (Incomplete)
 38 year old Caucasian female, married, lives with her family.  Known history of bipolar disorder and cluster B traits.  Presented in company of her husband and her outpatient psychiatrist advisement.  Reported to have been hearing screams of her baby and cell phone noise.  Patient had medication adjustments done during this admission.  I assumed care of this patient today.  Patient discussed at multidisciplinary team meeting.  Scheduled for discharge today.  Stated to be doing well.  No challenging behavior.  No observed response to internal stimuli.  Treating psychiatrist yesterday spoke with patient's husband who feels like she is back to her baseline.  I met with her for the first time today.  Patient reports that she was taken off Trilafon and switched to quetiapine.  Reports that lamotrigine was started during this inpatient care.  She has been on it in the past but discontinued using it in February.  Patient has tolerated rapid titration without any adverse effects.  Specifically she did not voice any skin rash.  Patient is no longer experiencing any hallucination in any modality.  She is not experiencing any delusions.  No self-injurious thoughts.  No rageful thoughts towards others or to property.  She feels like her medications are working fairly well for her.  No adverse effects from her medicines.  No new psychosocial stressors.  Her husband remains supportive.  States that her husband will be picking her up later.  At this point in time, patient is not a danger to herself or anyone else.  Patient is stable for care at the lower setting.

## 2023-12-13 NOTE — Plan of Care (Signed)
   Problem: Education: Goal: Knowledge of [facility] Education information/materials will improve Outcome: Progressing Goal: Emotional status will improve Outcome: Progressing Goal: Mental status will improve Outcome: Progressing Goal: Verbalization of understanding the information provided will improve Outcome: Progressing
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
## 2023-12-13 NOTE — Discharge Instructions (Signed)
-  Take Lamictal every day and if you miss more than 2 doses please reach out to your outpatient psychiatrist for recommendations  -If you develop any larger rashes please reach out to your primary care or psychiatrist immediately or go to the emergency department  -call [facility] to set up therapy and medication management  -Follow-up with your outpatient psychiatric provider (and therapist) -instructions on appointment date, time, and address (location) are provided to you in discharge paperwork.  -Take your psychiatric medications as prescribed at discharge - instructions are provided to you in the discharge paperwork  -Follow-up with outpatient primary care doctor and other specialists -for management of preventative medicine and any chronic medical disease.  -Recommend abstinence from alcohol, tobacco, cannabis, and other substances at discharge.   -If your psychiatric symptoms recur, worsen, or if you have severe side effects to your psychiatric medications, call your outpatient psychiatric provider, 911, 988 (national suicide hotline), go to [facility], or go to the nearest emergency department.  -If suicidal thoughts occur, call your outpatient psychiatric provider, 911, 988 (national suicide hotline), go to [facility], or go to the nearest emergency department.  Naloxone (Narcan) can help reverse an overdose when given to the victim quickly.  [facility] offers free naloxone kits and instructions/training on its use.  Add naloxone to your first aid kit and you can help save a life.   Pick up your free kit at the following locations:   [place]:  [facility], [address] [place] [address] [phone]) [facility] [address] [place] [address] [phone]) [facility] [address] [place] [address]  [place]: [facility] [address] [place] [address] ([phone]) [facility] [address] [place] [address] [phone])
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
## 2023-12-13 NOTE — Progress Notes (Signed)
   12/13/23 0933  Psych Admission Type (Psych Patients Only)  Admission Status Voluntary  Psychosocial Assessment  Patient Complaints None  Eye Contact Fair  Facial Expression Anxious  Affect Appropriate to circumstance  Speech Logical/coherent  Interaction Assertive  Motor Activity Other (Comment) (Unremarkable)  Appearance/Hygiene Unremarkable  Behavior Characteristics Cooperative;Appropriate to situation  Mood Anxious  Thought Process  Coherency WDL  Content WDL  Delusions None reported or observed  Perception WDL  Hallucination None reported or observed  Judgment WDL  Confusion None  Danger to Self  Current suicidal ideation? Denies  Self-Injurious Behavior No self-injurious ideation or behavior indicators observed or expressed   Agreement Not to Harm Self Yes  Description of Agreement Verbal  Danger to Others  Danger to Others None reported or observed

## 2023-12-13 NOTE — Group Note (Signed)
 Therapy Group Note  Group Topic:Other  Group Date: 12/13/2023 Start Time: 1425 End Time: 1503 Facilitators: Ted Mcalpine, OT    The objective of today's group is to provide a comprehensive understanding of the concept of "motivation" and its role in human behavior and well-being. The content covers various theories of motivation, including intrinsic and extrinsic motivators, and explores the psychological mechanisms that drive individuals to achieve goals, overcome obstacles, and make decisions. By diving into real-world applications, the group aims to offer actionable strategies for enhancing motivation in different life domains, such as work, relationships, and personal growth.  Utilizing a multi-disciplinary approach, this group integrates insights from psychology, neuroscience, and behavioral economics to present a holistic view of motivation. The objective is not only to educate the audience about the complexities and driving forces behind motivation but also to equip them with practical tools and techniques to improve their own motivation levels. By the end of this multi-day group, patient's should have a well-rounded understanding of what motivates human actions and how to harness this knowledge for personal and professional betterment.     Participation Level: Engaged   Participation Quality: Independent   Behavior: Appropriate   Speech/Thought Process: Relevant   Affect/Mood: Appropriate   Insight: Fair   Judgement: Fair      Modes of Intervention: Education  Patient Response to Interventions:  Attentive   Plan: Continue to engage patient in OT groups 2 - 3x/week.  12/13/2023  Ted Mcalpine, OT  Kerrin Champagne, OT

## 2023-12-13 NOTE — [facility] Suicide Risk Assessment (Deleted)
 Suicide Risk Assessment  Discharge Assessment    [facility] Discharge Suicide Risk Assessment   Principal Problem: Bipolar 1 disorder [facility]) Discharge Diagnoses: Principal Problem:   Bipolar 1 disorder (HCC) Active Problems:   Nicotine dependence  Kara Stephens is a 38 y.o. female  with a past psychiatric history of bipolar 1 disorder, 2 hospitalizations 2025 for mania with/without psychotic features, history of catatonic features, history of postpartum psychosis. Patient initially arrived to [facility] on 12/08/23 for acute mania, and admitted to [facility] on 12/09/2023 for crisis stabalization and intensive therapeutic interventions. PMHx is significant for asthma, type 2 diabetes.   Hospital course: Pt presented for intensive medication management in setting of documented mania by pt/ED but appeared to be moreover anxious distress and irritability. Pt has failed numerous antipsychotics. Pt reports hx of response lamictal and is currently doing well on seroquel, so she was started on lamictal and titrated to previous effective dose of 150 mg prior to discharge and seroquel increased from 150 to 200. By discharge, pts mood and anxiety was significantly improved. Pt is continuing PHP at discharge at her outpatient provider office in [facility].   Total Time spent with patient: 1 hour  Musculoskeletal: Strength & Muscle Tone: within normal limits Gait & Station: normal Patient leans: N/A  Psychiatric Specialty Exam  Presentation  General Appearance:  Appropriate for Environment  Eye Contact: Good  Speech: Normal Rate  Speech Volume: Normal  Handedness: Right   Mood and Affect  Mood: Euthymic  Duration of Depression Symptoms: No data recorded Affect: Appropriate; Congruent; Full Range (laghing and smiling)   Thought Process  Thought Processes: Coherent; Linear  Descriptions of Associations:Intact  Orientation:Full (Time, Place and Person)  Thought  Content:Logical  History of Schizophrenia/Schizoaffective disorder:No  Duration of Psychotic Symptoms:N/A  Hallucinations:Hallucinations: None  Ideas of Reference:None  Suicidal Thoughts:Suicidal Thoughts: No  Homicidal Thoughts:Homicidal Thoughts: No   Sensorium  Memory: Immediate Good; Recent Good; Remote Good  Judgment: Good (plan to restart PHP)  Insight: Good   Executive Functions  Concentration: Good  Attention Span: Good  Recall: Good  Fund of Knowledge: Good  Language: Good   Psychomotor Activity  Psychomotor Activity: Psychomotor Activity: Normal   Assets  Assets: Desire for Improvement; Financial Resources/Insurance; Housing; Social Support; Resilience   Sleep  Sleep: Sleep: Good Number of Hours of Sleep: 8.25   Physical Exam Vitals and nursing note reviewed.  Pulmonary:     Effort: Pulmonary effort is normal.  Skin:    Comments: No rashes throughout hospitalization included discharge.  Neurological:     General: No focal deficit present.     Mental Status: She is alert.  Psychiatric:     Comments: No obvious EPS.       Review of Systems  Constitutional:  Negative for fever.  Cardiovascular:  Negative for chest pain and palpitations.  Gastrointestinal:  Negative for constipation, diarrhea, nausea and vomiting.  Skin:        Denies rashes throughout hospitalization including discharge.  Neurological:  Negative for dizziness, weakness and headaches.  Blood pressure 136/87, pulse 93, temperature 98.5 F (36.9 C), temperature source Oral, resp. rate 20, height 5\' 6"  (1.676 m), weight 115.8 kg, SpO2 100%. Body mass index is 41.22 kg/m.  Mental Status Per Nursing Assessment::   On Admission:  NA  Demographic Factors:  Caucasian  Loss Factors: NA  Historical Factors: Prior suicide attempts and Impulsivity  Risk Reduction Factors:   Responsible for children under 13 years of age,  Sense of responsibility to family,  Living with another person, especially a relative, Positive social support, Positive therapeutic relationship, and Positive coping skills or problem solving skills  Continued Clinical Symptoms:  None currently.   Cognitive Features That Contribute To Risk:  None    Suicide Risk:  Mild:  There are no identifiable suicide plans, no associated intent, mild dysphoria and related symptoms, good self-control (both objective and subjective assessment), few other risk factors, and identifiable protective factors, including available and accessible social support.   Follow-up Information     Services, [facility]. Go on 12/15/2023.   Why: A referral has been made to this provider for therapy and/or medication management services. Please go on 12/15/23 at 8:30 am. * Address: [address] [place], [place]. Contact information: [address] [place] [address] [phone]         [name], NP Follow up on 12/14/2023.   Why: Please call your provider to schedule an appointment, on 12/14/23 at 9:00 am. Contact information: [address] [place], [place] [address] Phone: [phone]                Plan Of Care/Follow-up recommendations:  Activity: as tolerated   Diet: heart healthy   Other: -Follow-up with your outpatient psychiatric provider -instructions on appointment date, time, and address (location) are provided to you in discharge paperwork.   -Take your psychiatric medications as prescribed at discharge - instructions are provided to you in the discharge paperwork   -Follow-up with outpatient primary care doctor and other specialists -for management of chronic medical disease, including: Follow-up on obstructive sleep apnea workup   -Testing: Follow-up with outpatient provider for abnormal lab results: None   -Recommend abstinence from alcohol, tobacco, and other illicit drug use at discharge.    -If your psychiatric symptoms  recur, worsen, or if you have side effects to your psychiatric medications, call your outpatient psychiatric provider, 911, [phone] or go to the nearest emergency department.   -If suicidal thoughts recur, call your outpatient psychiatric provider, 911, 988 or go to the nearest emergency department.   -Immediately go to the emergency department with any large rash that develops   -Make sure to take the Lamictal every day and did not miss any doses if he missed more than 2 doses please contact her outpatient psychiatrist   Meryl Dare, MD PGY-1 Psychiatry Resident 12/13/2023, 10:32 AM

## 2023-12-13 NOTE — Group Note (Signed)
 Date:  12/13/2023 Time:  9:18 AM  Group Topic/Focus:  Goals Group:   The focus of this group is to help patients establish daily goals to achieve during treatment and discuss how the patient can incorporate goal setting into their daily lives to aide in recovery.    Participation Level:  Active  Participation Quality:  Appropriate  Affect:  Appropriate  Erasmo Score 12/13/2023, 9:18 AM

## 2023-12-14 DIAGNOSIS — F319 Bipolar disorder, unspecified: Secondary | ICD-10-CM | POA: Diagnosis not present

## 2023-12-14 NOTE — Plan of Care (Signed)
  Problem: Education: Goal: Knowledge of [facility] Education information/materials will improve 12/14/2023 1247 by Melvenia Needles, RN Outcome: Completed/Met 12/14/2023 0956 by Melvenia Needles, RN Outcome: Progressing Goal: Emotional status will improve 12/14/2023 1247 by Melvenia Needles, RN Outcome: Completed/Met 12/14/2023 0956 by Melvenia Needles, RN Outcome: Progressing Goal: Mental status will improve 12/14/2023 1247 by Melvenia Needles, RN Outcome: Completed/Met 12/14/2023 0956 by Melvenia Needles, RN Outcome: Progressing Goal: Verbalization of understanding the information provided will improve 12/14/2023 1247 by Melvenia Needles, RN Outcome: Completed/Met 12/14/2023 0956 by Melvenia Needles, RN Outcome: Progressing   Problem: Activity: Goal: Interest or engagement in activities will improve 12/14/2023 1247 by Melvenia Needles, RN Outcome: Completed/Met 12/14/2023 0956 by Melvenia Needles, RN Outcome: Progressing Goal: Sleeping patterns will improve 12/14/2023 1247 by Melvenia Needles, RN Outcome: Completed/Met 12/14/2023 0956 by Melvenia Needles, RN Outcome: Progressing   Problem: Coping: Goal: Ability to verbalize frustrations and anger appropriately will improve 12/14/2023 1247 by Melvenia Needles, RN Outcome: Completed/Met 12/14/2023 0956 by Melvenia Needles, RN Outcome: Progressing Goal: Ability to demonstrate self-control will improve 12/14/2023 1247 by Melvenia Needles, RN Outcome: Completed/Met 12/14/2023 0956 by Melvenia Needles, RN Outcome: Progressing   Problem: Health Behavior/Discharge Planning: Goal: Identification of resources available to assist in meeting health care needs will improve 12/14/2023 1247 by Melvenia Needles, RN Outcome: Completed/Met 12/14/2023 0956 by Melvenia Needles, RN Outcome: Progressing Goal: Compliance with treatment plan for underlying cause of condition will improve 12/14/2023 1247 by Melvenia Needles, RN Outcome:  Completed/Met 12/14/2023 0956 by Melvenia Needles, RN Outcome: Progressing   Problem: Physical Regulation: Goal: Ability to maintain clinical measurements within normal limits will improve 12/14/2023 1247 by Melvenia Needles, RN Outcome: Completed/Met 12/14/2023 0956 by Melvenia Needles, RN Outcome: Progressing   Problem: Safety: Goal: Periods of time without injury will increase 12/14/2023 1247 by Melvenia Needles, RN Outcome: Completed/Met 12/14/2023 0956 by Melvenia Needles, RN Outcome: Progressing

## 2023-12-14 NOTE — Progress Notes (Signed)
 Barnet Pall  D/C'd Home per MD order.  Discussed with the patient and all questions fully answered.   An After Visit Summary was printed and given to the patient. Patient received prescription.  D/c education completed with patient including follow up instructions, medication list, d/c activities limitations if indicated, with other d/c instructions as indicated by MD - patient able to verbalize understanding, all questions fully answered.   Patient instructed to return to ED, call [phone], or call MD for any changes in condition.   Patient escorted to the main, and D/C home via private auto.  Melvenia Needles 12/14/2023 12:48 PM

## 2023-12-14 NOTE — [facility] Suicide Risk Assessment (Signed)
 [facility] INPATIENT:  Family/Significant Other Suicide Prevention Education  Suicide Prevention Education:  Education Completed; Fransisca B. Klemp, (patient) has been identified by the patient as the family member/significant other with whom the patient will be residing, and identified as the person(s) who will aid the patient in the event of a mental health crisis (suicidal ideations/suicide attempt).  With written consent from the patient, the family member/significant other has been provided the following suicide prevention education, prior to the and/or following the discharge of the patient.  The suicide prevention education provided includes the following: Suicide risk factors Suicide prevention and interventions National Suicide Hotline telephone number [facility] assessment telephone number [facility] Emergency Assistance 911 [facility] and/or Residential Mobile Crisis Unit telephone number  Request made of family/significant other to: Remove weapons (e.g., guns, rifles, knives), all items previously/currently identified as safety concern.   Remove drugs/medications (over-the-counter, prescriptions, illicit drugs), all items previously/currently identified as a safety concern.  The family member/significant other verbalizes understanding of the suicide prevention education information provided.  The family member/significant other agrees to remove the items of safety concern listed above.  Patient was also provided a pamphlet to include emergency phone numbers to contact when SI arises.  Jodelle Gross Clare Fennimore 12/14/2023, 10:35 AM

## 2023-12-14 NOTE — Plan of Care (Signed)
   Problem: Activity: Goal: Interest or engagement in activities will improve Outcome: Progressing   Problem: Coping: Goal: Ability to verbalize frustrations and anger appropriately will improve Outcome: Progressing   Problem: Safety: Goal: Periods of time without injury will increase Outcome: Progressing

## 2023-12-14 NOTE — Group Note (Signed)
 Date:  12/14/2023 Time:  11:26 AM  Group Topic/Focus:  Goals Group:   The focus of this group is to help patients establish daily goals to achieve during treatment and discuss how the patient can incorporate goal setting into their daily lives to aide in recovery.    Participation Level:  Active  Participation Quality:  Attentive  Affect:  Appropriate  Cognitive:  Alert and Appropriate  Insight: Good  Engagement in Group:  Engaged  Modes of Intervention:  Education  Additional Comments:    Estill Dooms 12/14/2023, 11:26 AM

## 2024-07-04 ENCOUNTER — Ambulatory Visit (HOSPITAL_COMMUNITY)
Admission: EM | Admit: 2024-07-04 | Discharge: 2024-07-04 | Disposition: A | Discharge status: Other Acute Inpatient Hospital | Payer: MEDICAID | Attending: Nurse Practitioner | Admitting: Nurse Practitioner

## 2024-07-04 ENCOUNTER — Inpatient Hospital Stay (HOSPITAL_COMMUNITY)
Admission: AD | Admit: 2024-07-04 | Discharge: 2024-07-26 | DRG: 885 | Disposition: A | Discharge status: Home / Self Care | Payer: MEDICAID | Source: Intra-hospital

## 2024-07-04 DIAGNOSIS — F17210 Nicotine dependence, cigarettes, uncomplicated: Secondary | ICD-10-CM | POA: Diagnosis present

## 2024-07-04 DIAGNOSIS — Z79899 Other long term (current) drug therapy: Secondary | ICD-10-CM | POA: Diagnosis not present

## 2024-07-04 DIAGNOSIS — E119 Type 2 diabetes mellitus without complications: Secondary | ICD-10-CM | POA: Diagnosis present

## 2024-07-04 DIAGNOSIS — E7800 Pure hypercholesterolemia, unspecified: Secondary | ICD-10-CM | POA: Diagnosis present

## 2024-07-04 DIAGNOSIS — Z881 Allergy status to other antibiotic agents status: Secondary | ICD-10-CM | POA: Diagnosis not present

## 2024-07-04 DIAGNOSIS — E66813 Obesity, class 3: Secondary | ICD-10-CM | POA: Diagnosis present

## 2024-07-04 DIAGNOSIS — F429 Obsessive-compulsive disorder, unspecified: Secondary | ICD-10-CM | POA: Diagnosis present

## 2024-07-04 DIAGNOSIS — F3160 Bipolar disorder, current episode mixed, unspecified: Secondary | ICD-10-CM | POA: Diagnosis not present

## 2024-07-04 DIAGNOSIS — F603 Borderline personality disorder: Secondary | ICD-10-CM | POA: Diagnosis present

## 2024-07-04 DIAGNOSIS — Z7984 Long term (current) use of oral hypoglycemic drugs: Secondary | ICD-10-CM | POA: Diagnosis not present

## 2024-07-04 DIAGNOSIS — Z5941 Food insecurity: Secondary | ICD-10-CM

## 2024-07-04 DIAGNOSIS — Z83438 Family history of other disorder of lipoprotein metabolism and other lipidemia: Secondary | ICD-10-CM

## 2024-07-04 DIAGNOSIS — F410 Panic disorder [episodic paroxysmal anxiety] without agoraphobia: Secondary | ICD-10-CM | POA: Diagnosis present

## 2024-07-04 DIAGNOSIS — E781 Pure hyperglyceridemia: Secondary | ICD-10-CM | POA: Diagnosis present

## 2024-07-04 DIAGNOSIS — E559 Vitamin D deficiency, unspecified: Secondary | ICD-10-CM | POA: Insufficient documentation

## 2024-07-04 DIAGNOSIS — Z8249 Family history of ischemic heart disease and other diseases of the circulatory system: Secondary | ICD-10-CM

## 2024-07-04 DIAGNOSIS — R45851 Suicidal ideations: Secondary | ICD-10-CM | POA: Diagnosis present

## 2024-07-04 DIAGNOSIS — Z6838 Body mass index (BMI) 38.0-38.9, adult: Secondary | ICD-10-CM

## 2024-07-04 DIAGNOSIS — F6089 Other specific personality disorders: Secondary | ICD-10-CM | POA: Diagnosis present

## 2024-07-04 DIAGNOSIS — Z7982 Long term (current) use of aspirin: Secondary | ICD-10-CM

## 2024-07-04 DIAGNOSIS — I10 Essential (primary) hypertension: Secondary | ICD-10-CM | POA: Diagnosis present

## 2024-07-04 DIAGNOSIS — F3162 Bipolar disorder, current episode mixed, moderate: Principal | ICD-10-CM | POA: Diagnosis present

## 2024-07-04 DIAGNOSIS — Z818 Family history of other mental and behavioral disorders: Secondary | ICD-10-CM | POA: Diagnosis not present

## 2024-07-04 DIAGNOSIS — I493 Ventricular premature depolarization: Secondary | ICD-10-CM | POA: Diagnosis present

## 2024-07-04 DIAGNOSIS — R45850 Homicidal ideations: Secondary | ICD-10-CM | POA: Diagnosis present

## 2024-07-04 DIAGNOSIS — F319 Bipolar disorder, unspecified: Principal | ICD-10-CM | POA: Diagnosis present

## 2024-07-04 DIAGNOSIS — Z882 Allergy status to sulfonamides status: Secondary | ICD-10-CM | POA: Diagnosis not present

## 2024-07-04 DIAGNOSIS — F419 Anxiety disorder, unspecified: Secondary | ICD-10-CM | POA: Insufficient documentation

## 2024-07-04 DIAGNOSIS — J45909 Unspecified asthma, uncomplicated: Secondary | ICD-10-CM | POA: Insufficient documentation

## 2024-07-04 DIAGNOSIS — F17200 Nicotine dependence, unspecified, uncomplicated: Secondary | ICD-10-CM | POA: Insufficient documentation

## 2024-07-04 DIAGNOSIS — F29 Unspecified psychosis not due to a substance or known physiological condition: Secondary | ICD-10-CM | POA: Diagnosis present

## 2024-07-04 DIAGNOSIS — Z888 Allergy status to other drugs, medicaments and biological substances status: Secondary | ICD-10-CM

## 2024-07-04 LAB — LIPID PANEL
Cholesterol: 223 mg/dL — ABNORMAL HIGH (ref 0–200)
HDL: 53 mg/dL (ref >40)
LDL Cholesterol: 150 mg/dL — ABNORMAL HIGH (ref 0–99)
Total CHOL/HDL Ratio: 4.2 ratio
Triglycerides: 100 mg/dL (ref <150)
VLDL: 20 mg/dL (ref 0–40)

## 2024-07-04 LAB — CBC WITH DIFFERENTIAL/PLATELET
Abs Immature Granulocytes: 0.07 K/uL (ref 0.00–0.07)
Basophils Absolute: 0 K/uL (ref 0.0–0.1)
Basophils Relative: 0 %
Eosinophils Absolute: 0 K/uL (ref 0.0–0.5)
Eosinophils Relative: 0 %
HCT: 43.2 % (ref 36.0–46.0)
Hemoglobin: 14.3 g/dL (ref 12.0–15.0)
Immature Granulocytes: 1 %
Lymphocytes Relative: 25 %
Lymphs Abs: 3.5 K/uL (ref 0.7–4.0)
MCH: 28.6 pg (ref 26.0–34.0)
MCHC: 33.1 g/dL (ref 30.0–36.0)
MCV: 86.4 fL (ref 80.0–100.0)
Monocytes Absolute: 0.6 K/uL (ref 0.1–1.0)
Monocytes Relative: 5 %
Neutro Abs: 9.6 K/uL — ABNORMAL HIGH (ref 1.7–7.7)
Neutrophils Relative %: 69 %
Platelets: 338 K/uL (ref 150–400)
RBC: 5 MIL/uL (ref 3.87–5.11)
RDW: 14.4 % (ref 11.5–15.5)
WBC: 13.9 K/uL — ABNORMAL HIGH (ref 4.0–10.5)
nRBC: 0 % (ref 0.0–0.2)

## 2024-07-04 LAB — COMPREHENSIVE METABOLIC PANEL WITH GFR
ALT: 35 U/L (ref 0–44)
AST: 26 U/L (ref 15–41)
Albumin: 4.8 g/dL (ref 3.5–5.0)
Alkaline Phosphatase: 66 U/L (ref 38–126)
Anion gap: 13 (ref 5–15)
BUN: 9 mg/dL (ref 6–20)
CO2: 25 mmol/L (ref 22–32)
Calcium: 10.3 mg/dL (ref 8.9–10.3)
Chloride: 101 mmol/L (ref 98–111)
Creatinine, Ser: 0.96 mg/dL (ref 0.44–1.00)
GFR, Estimated: >60 mL/min (ref >60)
Glucose, Bld: 94 mg/dL (ref 70–99)
Potassium: 4.1 mmol/L (ref 3.5–5.1)
Sodium: 139 mmol/L (ref 135–145)
Total Bilirubin: 0.6 mg/dL (ref 0.0–1.2)
Total Protein: 7.7 g/dL (ref 6.5–8.1)

## 2024-07-04 LAB — URINALYSIS, MICROSCOPIC (REFLEX)

## 2024-07-04 LAB — ETHANOL: Alcohol, Ethyl (B): <15 mg/dL (ref <15)

## 2024-07-04 LAB — URINALYSIS, ROUTINE W REFLEX MICROSCOPIC
Bilirubin Urine: NEGATIVE
Glucose, UA: NEGATIVE mg/dL
Ketones, ur: NEGATIVE mg/dL
Nitrite: NEGATIVE
Protein, ur: NEGATIVE mg/dL
Specific Gravity, Urine: >1.03 — ABNORMAL HIGH (ref 1.005–1.030)
pH: 6 (ref 5.0–8.0)

## 2024-07-04 LAB — POCT URINE DRUG SCREEN - MANUAL ENTRY (I-SCREEN)
POC Amphetamine UR: NOT DETECTED
POC Buprenorphine (BUP): NOT DETECTED
POC Cocaine UR: NOT DETECTED
POC Marijuana UR: NOT DETECTED
POC Methadone UR: NOT DETECTED
POC Methamphetamine UR: NOT DETECTED
POC Morphine: NOT DETECTED
POC Oxazepam (BZO): NOT DETECTED
POC Oxycodone UR: NOT DETECTED
POC Secobarbital (BAR): NOT DETECTED

## 2024-07-04 LAB — POCT PREGNANCY, URINE: Preg Test, Ur: NEGATIVE

## 2024-07-04 LAB — POC URINE PREG, ED: Preg Test, Ur: NEGATIVE

## 2024-07-04 LAB — HEMOGLOBIN A1C
Hgb A1c MFr Bld: 5.4 % (ref 4.8–5.6)
Mean Plasma Glucose: 108.28 mg/dL

## 2024-07-04 LAB — TSH: TSH: 3.109 u[IU]/mL (ref 0.350–4.500)

## 2024-07-04 LAB — MAGNESIUM: Magnesium: 1.9 mg/dL (ref 1.7–2.4)

## 2024-07-04 MED ORDER — HALOPERIDOL LACTATE 5 MG/ML IJ SOLN: 10.0000 mg | Freq: Three times a day (TID) | INTRAMUSCULAR | Status: DC | PRN

## 2024-07-04 MED ORDER — DIPHENHYDRAMINE HCL 50 MG/ML IJ SOLN: 50.0000 mg | Freq: Three times a day (TID) | INTRAMUSCULAR | Status: DC | PRN

## 2024-07-04 MED ORDER — DIPHENHYDRAMINE HCL 50 MG PO CAPS: 50.0000 mg | ORAL_CAPSULE | Freq: Three times a day (TID) | ORAL | Status: DC | PRN

## 2024-07-04 MED ORDER — HALOPERIDOL LACTATE 5 MG/ML IJ SOLN: 5.0000 mg | Freq: Three times a day (TID) | INTRAMUSCULAR | Status: DC | PRN

## 2024-07-04 MED ORDER — TRAZODONE HCL 50 MG PO TABS
50.0000 mg | ORAL_TABLET | Freq: Every evening | ORAL | Status: DC | PRN
  Administered 2024-07-04: 50 mg via ORAL
  Filled 2024-07-04: qty 1

## 2024-07-04 MED ORDER — HYDROXYZINE HCL 25 MG PO TABS
25.0000 mg | ORAL_TABLET | Freq: Three times a day (TID) | ORAL | Status: DC | PRN
  Administered 2024-07-04: 25 mg via ORAL
  Filled 2024-07-04: qty 1

## 2024-07-04 MED ORDER — LORAZEPAM 2 MG/ML IJ SOLN: 2.0000 mg | Freq: Three times a day (TID) | INTRAMUSCULAR | Status: DC | PRN

## 2024-07-04 MED ORDER — HYDROXYZINE HCL 25 MG PO TABS
25.0000 mg | ORAL_TABLET | Freq: Three times a day (TID) | ORAL | Status: DC | PRN
  Administered 2024-07-05 – 2024-07-08 (×9): 25 mg via ORAL
  Filled 2024-07-04 (×11): qty 1

## 2024-07-04 MED ORDER — ALUM & MAG HYDROXIDE-SIMETH 200-200-20 MG/5ML PO SUSP: 30.0000 mL | ORAL | Status: DC | PRN

## 2024-07-04 MED ORDER — MAGNESIUM HYDROXIDE 400 MG/5ML PO SUSP: 30.0000 mL | Freq: Every day | ORAL | Status: DC | PRN

## 2024-07-04 MED ORDER — ACETAMINOPHEN 325 MG PO TABS
650.0000 mg | ORAL_TABLET | Freq: Four times a day (QID) | ORAL | Status: DC | PRN
  Administered 2024-07-04: 650 mg via ORAL
  Filled 2024-07-04: qty 2

## 2024-07-04 MED ORDER — LOPERAMIDE HCL 2 MG PO CAPS
2.0000 mg | ORAL_CAPSULE | Freq: Once | ORAL | Status: AC
Start: 2024-07-04 — End: 2024-07-04
  Administered 2024-07-04: 2 mg via ORAL
  Filled 2024-07-04: qty 1

## 2024-07-04 MED ORDER — HALOPERIDOL 5 MG PO TABS: 5.0000 mg | ORAL_TABLET | Freq: Three times a day (TID) | ORAL | Status: DC | PRN

## 2024-07-04 MED ORDER — ACETAMINOPHEN 325 MG PO TABS
650.0000 mg | ORAL_TABLET | Freq: Four times a day (QID) | ORAL | Status: DC | PRN
  Administered 2024-07-05 – 2024-07-23 (×12): 650 mg via ORAL
  Filled 2024-07-04 (×12): qty 2

## 2024-07-04 MED ORDER — TRAZODONE HCL 50 MG PO TABS: 50.0000 mg | ORAL_TABLET | Freq: Every evening | ORAL | Status: DC | PRN

## 2024-07-04 NOTE — Progress Notes (Signed)
   07/04/24 1313  [facility] Triage Screening (Walk-ins at [facility] only)  How Did You Hear About Us ? Self  What Is the Reason for Your Visit/Call Today? Patient is a 38 y.o. female with a hx of Bipolar Disorder who presents, accompanied by sister and 2 y.o. son to [facility] for assessment.  She reports she has had a hx of manic episodes requiring inpatient treatment for stabilization.  She reports she was admitted to [facility] in April, and they messed up my meds.  She was stabilized after this at [facility].  She is followed by Dr. Chyrl Na for med management and she sees Velinda Beat for therapy.  She states she has noticed the onset of an episode, as she typically begins to experrience sleep issues and then starts having disorganized and fuzzy thoughts, fatigue and a bad feeling.  She reports she began experiencing these symptoms a couple of days ago.  She states she didn't sleep well the night before last and she did not sleep at all last night.  Patient states she is afraid to be left alone, sharing she is concerned symptoms could worsen to levels they have in the past.  She endorses SI, stating she felt hopeless this morning and began to have suicidal thoughts, and she began contemplating plans of going to the kitchen to get a knife or overdose on my meds.  Patient denies HI, AVH or SA hx.  How Long Has This Been Causing You Problems? 1 wk - 1 month  Have You Recently Had Any Thoughts About Hurting Yourself? Yes  How long ago did you have thoughts about hurting yourself? mentioned SI, considering plans started last night  Are You Planning to Commit Suicide/Harm Yourself At This time? Yes (mentions plans she has considered)  Have you Recently Had Thoughts About Hurting Someone [place]? No  Are You Planning To Harm Someone At This Time? No  Physical Abuse Denies  Verbal Abuse Denies  Sexual Abuse Denies  Exploitation of patient/patient's resources Denies  Self-Neglect  Denies  Possible abuse reported to:  (N/A)  Are you currently experiencing any auditory, visual or other hallucinations? No  Have You Used Any Alcohol or Drugs in the Past 24 Hours? No  Do you have any current medical co-morbidities that require immediate attention? No  Clinician description of patient physical appearance/behavior: Patient presents with flat affect, slowed speech, cooperative.   She is AAOx4  What Do You Feel Would Help You the Most Today? Treatment for Depression or other mood problem  If access to [facility] was not available, would you have sought care in the Emergency Department? No  Determination of Need Urgent (48 hours)  Options For Referral Inpatient Hospitalization;Intensive Outpatient Therapy;BH Urgent Care  Determination of Need filed? Yes

## 2024-07-04 NOTE — Progress Notes (Signed)
 Pt was sitting in the dayroom eating sandwich tray. Pt came to the door of the dayroom and stated that she needed to talk with her nurse. Charge nurse was able to talk with the pt. Pt returned back to day room to continue eating tray. Within moments of returning to dayroom pt came back to the door way stating that she is currently homicidal. Pt stated that she is having thoughts of hurting roommate. Tech was able to inform charge nurse.

## 2024-07-04 NOTE — Progress Notes (Signed)
 Pt has been accepted to [facility] on 07/04/2024 Bed assignment: 406-02  Pt meets inpatient criteria per: Donia Snell NP  Attending Physician will be: Leita Arts MD  Report can be called to: Adult unit: [phone]  Pt can arrive after pending labs, vol, UDS, EKG.   Care Team Notified: [facility] Cherylynn Ernst RN, Donia Snell NP, Shanda Fiscus RN  [place] Trenise Turay LCSW-A   07/04/2024 3:15 PM

## 2024-07-04 NOTE — BH Assessment (Addendum)
 Comprehensive Clinical Assessment (CCA) Note  07/04/2024 Kara Stephens [id number]  Disposition: Per Donia Huff, NP inpatient treatment is recommended.  [facility] to review.   Disposition SW to pursue appropriate inpatient options.  The patient demonstrates the following risk factors for suicide: Chronic risk factors for suicide include: psychiatric disorder of Bipolar Disorder. Acute risk factors for suicide include: social withdrawal/isolation and starting to notice manic sx. Protective factors for this patient include: positive social support, positive therapeutic relationship, responsibility to others (children, family), and hope for the future. Considering these factors, the overall suicide risk at this point appears to be moderate. Patient is appropriate for outpatient follow up, once stabilized.   Patient is a 38 y.o. female with a hx of Bipolar Disorder who presents, accompanied by sister and 2 y.o. son to [facility] for assessment.  She reports she has had a hx of manic episodes requiring inpatient treatment for stabilization.  She reports she was admitted to [facility] in April, and they messed up my meds.  She was stabilized after this at [facility].  She is followed by Dr. Chyrl Na for med management and she sees Velinda Beat for therapy.  She states she has noticed the onset of an episode, as she typically begins to experience sleep issues and then starts having disorganized and fuzzy thoughts, fatigue and a bad feeling.  She reports she began experiencing these symptoms a couple of days ago.  She states she didn't sleep well the night before last and she did not sleep at all last night.  Patient states she is afraid to be left alone, sharing she is concerned symptoms could worsen to levels they have in the past.  She endorses SI, stating she felt hopeless this morning and began to have suicidal thoughts, and she began contemplating plans of going to the  kitchen to get a knife or overdose on my meds.  Patient mentions her sister is able to help with her kids and her mother is on the way from [place] to stay with them.   Patient denies HI, AVH or SA hx. Treatment options were discussed with patient, to include IOP.  She continues to express concerns with being alone or being around her 2 y.o. son right now.  Inpatient treatment has been recommended for safety and stabilization.    Chief Complaint: No chief complaint on file.  Visit Diagnosis: Bipolar Disorder    CCA Screening, Triage and Referral (STR)  Patient Reported Information How did you hear about us ? Self  What Is the Reason for Your Visit/Call Today? Patient is a 38 y.o. female with a hx of Bipolar Disorder who presents, accompanied by sister and 2 y.o. son to [facility] for assessment.  She reports she has had a hx of manic episodes requiring inpatient treatment for stabilization.  She reports she was admitted to [facility] in April, and they messed up my meds.  She was stabilized after this at [facility].  She is followed by Dr. Chyrl Na for med management and she sees Velinda Beat for therapy.  She states she has noticed the onset of an episode, as she typically begins to experrience sleep issues and then starts having disorganized and fuzzy thoughts, fatigue and a bad feeling.  She reports she began experiencing these symptoms a couple of days ago.  She states she didn't sleep well the night before last and she did not sleep at all last night.  Patient states  she is afraid to be left alone, sharing she is concerned symptoms could worsen to levels they have in the past.  She endorses SI, stating she felt hopeless this morning and began to have suicidal thoughts, and she began contemplating plans of going to the kitchen to get a knife or overdose on my meds.  Patient denies HI, AVH or SA hx.  How Long Has This Been Causing You Problems? 1 wk - 1  month  What Do You Feel Would Help You the Most Today? Treatment for Depression or other mood problem   Have You Recently Had Any Thoughts About Hurting Yourself? Yes  Are You Planning to Commit Suicide/Harm Yourself At This time? Yes (mentions plans she has considered)   Flowsheet Row [facility]Dis[facility]  C-SSRS RISK CATEGORY No Risk No Risk Low Risk    Have you Recently Had Thoughts About Hurting Someone [place]? No  Are You Planning to Harm Someone at This Time? No  Explanation: N/A   Have You Used Any Alcohol or Drugs in the Past 24 Hours? No  How Long Ago Did You Use Drugs or Alcohol? N/A What Did You Use and How Much? N/A  Do You Currently Have a Therapist/Psychiatrist? Yes  Name of Therapist/Psychiatrist: Name of Therapist/Psychiatrist: Patient is followed by Chyrl Na for med management and Signe Beat for therapy.   Have You Been Recently Discharged From Any Office Practice or Programs? No  Explanation of Discharge From Practice/Program: N/A    CCA Screening Triage Referral Assessment Type of Contact: Face-to-Face  Telemedicine Service Delivery:   Is this Initial or Reassessment?   Date Telepsych consult ordered in [facility]:    Time Telepsych consult ordered in [facility]:    Location of Assessment: [facility] Assessment Services  Provider Location: [facility] Assessment Services   Collateral Involvement: none reported   Does Patient Have a Automotive Engineer Guardian? No  Legal Guardian Contact Information: N/A  Copy of Legal Guardianship Form: -- (N/A)  Legal Guardian Notified of Arrival: -- (N/A)  Legal Guardian Notified of Pending Discharge: -- (N/A)  If Minor and Not Living with Parent(s), Who has Custody? N/A  Is CPS involved or ever been  involved? Never  Is APS involved or ever been involved? Never   Patient Determined To Be At Risk for Harm To Self or Others Based on Review of Patient Reported Information or Presenting Complaint? Yes, for Self-Harm  Method: -- (N/A, no HI)  Availability of Means: -- (N/A, no HI)  Intent: -- (N/A, no HI)  Notification Required: -- (N/A, no HI)  Additional Information for Danger to Others Potential: -- (N/A, no HI)  Additional Comments for Danger to Others Potential: N/A, no HI  Are There Guns or Other Weapons in Your Home? No  Types of Guns/Weapons: N/A  Are These Weapons Safely Secured?                            -- (N/A)  Who Could Verify You Are Able To Have These Secured: N/A  Do You Have any Outstanding Charges, Pending Court Dates, Parole/Probation? None reported  Contacted To Inform of Risk of Harm To Self or Others: Family/Significant Other:    Does Patient Present under Involuntary Commitment? No    [place] of  Residence: [facility]   Patient Currently Receiving the Following Services: Individual Therapy; Medication Management   Determination of Need: Urgent (48 hours)   Options For Referral: Inpatient Hospitalization; Intensive Outpatient Therapy; [facility] Urgent Care     CCA Biopsychosocial Patient Reported Schizophrenia/Schizoaffective Diagnosis in Past: No   Strengths: Patient is seeking treatment, stating she is hopeful to prevent worsening symptoms.  She has family support and is engaged in outpt services.   Mental Health Symptoms Depression:  Hopelessness; Worthlessness; Difficulty Concentrating; Change in energy/activity   Duration of Depressive symptoms: Duration of Depressive Symptoms: Greater than two weeks   Mania:  Racing thoughts   Anxiety:   Worrying; Tension; Restlessness   Psychosis:  None   Duration of Psychotic symptoms:    Trauma:  None   Obsessions:  None   Compulsions:  None   Inattention:  N/A    Hyperactivity/Impulsivity:  N/A   Oppositional/Defiant Behaviors:  N/A   Emotional Irregularity:  Mood lability   Other Mood/Personality Symptoms:  N/A    Mental Status Exam Appearance and self-care  Stature:  Average   Weight:  Average weight   Clothing:  Casual   Grooming:  Normal   Cosmetic use:  None   Posture/gait:  Normal   Motor activity:  Not Remarkable   Sensorium  Attention:  Normal   Concentration:  Normal   Orientation:  X5   Recall/memory:  Normal   Affect and Mood  Affect:  Flat   Mood:  Negative; Dysphoric   Relating  Eye contact:  Normal   Facial expression:  Sad   Attitude toward examiner:  Cooperative   Thought and Language  Speech flow: Normal   Thought content:  Appropriate to Mood and Circumstances   Preoccupation:  None   Hallucinations:  None   Organization:  Perseverations; Coherent   [employer] of Knowledge:  Average   Intelligence:  Average   Abstraction:  Normal   Judgement:  Fair   Dance Movement Psychotherapist:  Adequate   Insight:  Gaps   Decision Making:  Normal   Social Functioning  Social Maturity:  Responsible   Social Judgement:  Normal   Stress  Stressors:  Other (Comment) (mental health)   Coping Ability:  Overwhelmed; Exhausted   Skill Deficits:  None   Supports:  Family     Religion: Religion/Spirituality Are You A Religious Person?: Yes What is Your Religious Affiliation?: Christian How Might This Affect Treatment?: N/A  Leisure/Recreation: Leisure / Recreation Do You Have Hobbies?: Yes Leisure and Hobbies: being a mom, walking dogs (I have 2 big dogs), listening to music and cooking per EHR.  Exercise/Diet: Exercise/Diet Do You Exercise?: No Have You Gained or Lost A Significant Amount of Weight in the Past Six Months?: No Do You Follow a Special Diet?: No Do You Have Any Trouble Sleeping?: Yes Explanation of Sleeping Difficulties: Poor sleep starting 2 days ago, no  sleep last night.   CCA Employment/Education Employment/Work Situation: Employment / Work Situation Employment Situation: Unemployed Patient's Job has Been Impacted by Current Illness:  (N/A) Has Patient ever Been in Equities Trader?: No  Education: Education Is Patient Currently Attending School?: No Last Grade Completed: 16 Did You Product Manager?: Yes What Type of College Degree Do you Have?: 4 year degree Did You Have An Individualized Education Program (IIEP): No Did You Have Any Difficulty At School?: No Patient's Education Has Been Impacted by Current Illness: No   CCA Family/Childhood History Family and Relationship History:  Family history Marital status: Married Number of Years Married:  (NA) What types of issues is patient dealing with in the relationship?: No concerns noted, describes husband as supportive. Additional relationship information: NA Does patient have children?: Yes How many children?: 3 How is patient's relationship with their children?: Good, 36 y.o, 42 y.o. and 2 y.o son. No concerns noted.  Childhood History:  Childhood History By whom was/is the patient raised?: Both parents Did patient suffer any verbal/emotional/physical/sexual abuse as a child?: No Did patient suffer from severe childhood neglect?: No Has patient ever been sexually abused/assaulted/raped as an adolescent or adult?: No Was the patient ever a victim of a crime or a disaster?: No Witnessed domestic violence?: No Has patient been affected by domestic violence as an adult?: No       CCA Substance Use Alcohol/Drug Use: Alcohol / Drug Use Pain Medications: see MAR Prescriptions: see MAR Over the Counter: see MAR History of alcohol / drug use?: No history of alcohol / drug abuse Longest period of sobriety (when/how long): n/a                         ASAM's:  Six Dimensions of Multidimensional Assessment  Dimension 1:  Acute Intoxication and/or Withdrawal  Potential:      Dimension 2:  Biomedical Conditions and Complications:      Dimension 3:  Emotional, Behavioral, or Cognitive Conditions and Complications:     Dimension 4:  Readiness to Change:     Dimension 5:  Relapse, Continued use, or Continued Problem Potential:     Dimension 6:  Recovery/Living Environment:     ASAM Severity Score:    ASAM Recommended Level of Treatment:     Substance use Disorder (SUD)    Recommendations for Services/Supports/Treatments:    Disposition Recommendation per psychiatric provider: We recommend inpatient psychiatric hospitalization when medically cleared. Patient is under voluntary admission status at this time; please IVC if attempts to leave hospital.   DSM5 Diagnoses: Patient Active Problem List   Diagnosis Date Noted   Nicotine  dependence 12/10/2023   Bipolar 1 disorder (HCC) 12/09/2023   Bipolar I disorder, most recent episode depressed, severe without psychotic features (HCC) 08/12/2022   Anxiety 08/12/2022   Insomnia 08/12/2022   Essential hypertension 08/12/2022   Pilonidal cyst 06/01/2022   Carpal tunnel syndrome 01/30/2022   Sciatica of right side 10/29/2021   Ganglion cyst of flexor tendon sheath of finger, left 09/02/2020   Trigger finger, left middle finger 08/26/2020   Obesity (BMI 30.0-34.9) 12/07/2019   Prediabetes 12/07/2019   COVID-19 09/21/2019   Respiratory failure (HCC) 09/21/2019   Closed displaced fracture of neck of fifth metacarpal bone of left hand 08/03/2015   Tetrahydrocannabinol (THC) use disorder, moderate, dependence (HCC) 07/17/2015   Bipolar affective disorder, current episode manic with psychotic symptoms (HCC) 07/16/2015   Asthma 03/20/2009   PAIN IN JOINT, ANKLE AND FOOT 03/20/2009   Sprain of ankle 03/20/2009     Referrals to Alternative Service(s): Referred to Alternative Service(s):   Place:   Date:   Time:    Referred to Alternative Service(s):   Place:   Date:   Time:    Referred to  Alternative Service(s):   Place:   Date:   Time:    Referred to Alternative Service(s):   Place:   Date:   Time:     Deland LITTIE Louder, [facility]

## 2024-07-04 NOTE — ED Notes (Signed)
 Pt came voluntarily to facility requesting help with anxiety and suicidal thinking. Pt states, sometimes I think about putting my finger in a socket and see what happens. I won't do it though because I have 3 children depending on me. I just want these thoughts to stop. I can't get rest. I feel like my medicines need to be regulated. So, I'm here for help with that. If I go home, I'm scared of what I might do to myself. Calm, cooperative throughout interview process. Skin assessment completed. Oriented to unit. Meal and drink offered. At currrent, pt continue to endorse SI no plan or intent. Denies HI/AVH. Pt verbally contract for safety in facility only. Will monitor for safety.

## 2024-07-04 NOTE — ED Notes (Signed)
 Pt requested medication for anxiety. Pt received Vistaril  but stated, I thought they would give me some Ativan  for anxiety. Informed pt that that decision would be up to the providers discretion. Pt returned to the phone call. Safety maintained.

## 2024-07-04 NOTE — Progress Notes (Signed)
 Pt was oriented to unit following admission assessment and was eating in the dayroom. Pt reported to [facility] that she was having HI and thoughts to harm her roommate, which she communicated to the charge nurse. The charge nurse then communicated to this RN. For safety reasons, the pt was moved to the quiet room to spend the night. Door is open, and pt has free range to move around. Pt remains safe at this time. Will continue to assess.
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
## 2024-07-04 NOTE — ED Notes (Signed)
 Pt seen in recliner calm and cooperative. A&Ox4. She reports active SI with plan to overdose on medications. She verbally contracts to safety while on the unit but says she's afraid she'll hurt herself at home. She also reports HI with no particular person in mind. She stated I've been overwhelmed and I'm afraid to be alone with my son. She denies AVH. Pt reported diarrhea for the past 3 days. Imodium 2mg  ordered and administered. She has a fair appetite and is med compliant. She is safe on the unit at this time with Q15 min safety checks in place.

## 2024-07-04 NOTE — ED Notes (Signed)
 Pt approached nurses station stating, Can you let the doctor know when I get to the other hospital, I would like a sitter (grinning). Pt states not feeling safe if she is left by herself and would like someone to be with her at all times. Reassured pt of her safety at both facilities. Pt grinning, walked away.

## 2024-07-05 ENCOUNTER — Other Ambulatory Visit: Payer: Self-pay

## 2024-07-05 ENCOUNTER — Encounter (HOSPITAL_COMMUNITY): Payer: Self-pay | Admitting: Psychiatry

## 2024-07-05 ENCOUNTER — Encounter (HOSPITAL_COMMUNITY): Payer: Self-pay

## 2024-07-05 DIAGNOSIS — F3162 Bipolar disorder, current episode mixed, moderate: Secondary | ICD-10-CM | POA: Diagnosis not present

## 2024-07-05 MED ORDER — LAMOTRIGINE 100 MG PO TABS
200.0000 mg | ORAL_TABLET | Freq: Every day | ORAL | Status: DC
  Administered 2024-07-05 – 2024-07-07 (×3): 200 mg via ORAL
  Filled 2024-07-05 (×3): qty 2

## 2024-07-05 MED ORDER — METFORMIN HCL 500 MG PO TABS
500.0000 mg | ORAL_TABLET | Freq: Every day | ORAL | Status: DC
  Administered 2024-07-06 – 2024-07-26 (×21): 500 mg via ORAL
  Filled 2024-07-05 (×21): qty 1

## 2024-07-05 MED ORDER — QUETIAPINE FUMARATE ER 50 MG PO TB24
250.0000 mg | ORAL_TABLET | Freq: Every day | ORAL | Status: DC
  Administered 2024-07-05 – 2024-07-07 (×3): 250 mg via ORAL
  Filled 2024-07-05 (×4): qty 1

## 2024-07-05 MED ORDER — QUETIAPINE FUMARATE 25 MG PO TABS
12.5000 mg | ORAL_TABLET | Freq: Two times a day (BID) | ORAL | Status: DC
  Administered 2024-07-05 – 2024-07-06 (×2): 12.5 mg via ORAL
  Filled 2024-07-05 (×2): qty 1

## 2024-07-05 MED ORDER — TRAZODONE HCL 100 MG PO TABS
100.0000 mg | ORAL_TABLET | Freq: Every day | ORAL | Status: DC
  Administered 2024-07-05 – 2024-07-06 (×2): 100 mg via ORAL
  Filled 2024-07-05 (×2): qty 1

## 2024-07-05 MED ORDER — QUETIAPINE FUMARATE ER 200 MG PO TB24: 200.0000 mg | ORAL_TABLET | Freq: Every day | ORAL | Status: DC

## 2024-07-05 MED ORDER — MEMANTINE HCL 5 MG PO TABS
10.0000 mg | ORAL_TABLET | Freq: Two times a day (BID) | ORAL | Status: DC
  Administered 2024-07-05 – 2024-07-26 (×42): 10 mg via ORAL
  Filled 2024-07-05 (×41): qty 2

## 2024-07-05 MED ORDER — PROPRANOLOL HCL ER 60 MG PO CP24
60.0000 mg | ORAL_CAPSULE | Freq: Every day | ORAL | Status: DC
  Administered 2024-07-05 – 2024-07-26 (×22): 60 mg via ORAL
  Filled 2024-07-05 (×22): qty 1

## 2024-07-05 MED ORDER — MELATONIN 3 MG PO TABS
3.0000 mg | ORAL_TABLET | Freq: Once | ORAL | Status: AC
Start: 2024-07-05 — End: 2024-07-05
  Administered 2024-07-05: 3 mg via ORAL
  Filled 2024-07-05: qty 1

## 2024-07-05 MED ORDER — TRAZODONE HCL 50 MG PO TABS
50.0000 mg | ORAL_TABLET | Freq: Once | ORAL | Status: AC
  Administered 2024-07-05: 50 mg via ORAL
  Filled 2024-07-05: qty 1

## 2024-07-05 NOTE — Group Note (Signed)
 Recreation Therapy Group Note   Group Topic:Problem Solving  Group Date: 07/05/2024 Start Time: 0945 End Time: 1005 Facilitators: Clearance Chenault-McCall, LRT,CTRS Location: [address] Dayroom   Group Topic: Problem Solving  Goal Area(s) Addresses:  Patient will effectively work in a team with other group members. Patient will verbalize importance of using appropriate problem solving techniques.  Patient will identify positive change associated with effective problem solving skills.   Behavioral Response:   Intervention: Worksheet  Activity: Dentist. Patients were given a sheet front and back of brain teasers. Patients worked together (or alone) to figure out each puzzle presented.     Education: Problem solving as an important skill in daily life  Education Outcome: Acknowledges understanding/In group clarification offered/Needs additional education.    Affect/Mood: N/A   Participation Level: Did not attend    Clinical Observations/Individualized Feedback:      Plan: Continue to engage patient in RT group sessions 2-3x/week.   Ayden Hardwick-McCall, LRT,CTRS 07/05/2024 1:40 PM

## 2024-07-05 NOTE — [facility] Suicide Risk Assessment (Signed)
 [facility] Admission Suicide Risk Assessment   Total Time spent with patient: 45 minutes  Principal Problem: Bipolar 1 disorder, mixed, moderate (HCC) Diagnosis:  Principal Problem:   Bipolar 1 disorder, mixed, moderate (HCC)  Suicide risk: The patient presents with acute risk factors of mixed-type mood symptoms and intrusive suicidal thoughts (although denies actual suicidal ideation). She carries additional risk factors of history of depression, history of suicidal thoughts, poor coping skills, and psychosocial stressors. These factors are mitigated by protective factors including no prior suicide attempts, no current actual suicidal ideation (intrusive thoughts are ego-dystonic), history of presenting to the ED rather than acting on thoughts, medication compliance and help-seeking behaviors, future orientation, caring for young children and marriage. Current risk of suicide is considered low/moderate.   Plan to admit voluntarily to inpatient psychiatry, adjust medications, and encourage groups/therapeutic milieu.  I certify that inpatient services furnished can reasonably be expected to improve the patient's condition.   Kara LOISE Arts, MD 07/05/2024, 1:52 PM

## 2024-07-05 NOTE — Group Note (Deleted)
 Date:  07/05/2024 Time:  10:58 AM  Group Topic/Focus:  Developing a Wellness Toolbox:   The focus of this group is to help patients develop a wellness toolbox with skills and strategies to promote recovery upon discharge. Along will doing a spiritual wellness assesment    Participation Level:  {B[facility] PARTICIPATION LEVEL:22264}  Participation Quality:  {BHH PARTICIPATION QUALITY:22265}  Affect:  {BHH AFFECT:22266}  Cognitive:  {BHH COGNITIVE:22267}  Insight: {BHH Insight2:20797}  Engagement in Group:  {BHH ENGAGEMENT IN HMNLE:77731}  Modes of Intervention:  {BHH MODES OF INTERVENTION:22269}  Additional Comments:  ***  Terrah Decoster M Alen Matheson 07/05/2024, 10:58 AM

## 2024-07-05 NOTE — Plan of Care (Signed)

## 2024-07-05 NOTE — Progress Notes (Signed)
 Tour of Duty:  Prentice JINNY Angle, RN, 07/05/24, Tour of Duty: 0700-1900  SI/HI/AVH: [place] SI And HI towards roommate  Self-Reported   Mood: Negative  Anxiety: Endorses Depression: Endorses Irritability: Endorses  Broset  Violence Prevention Guidelines *See Row Information*: Small Violence Risk interventions implemented   LBM  Last BM Date : 07/05/24   Pain: not present  Patient Refusals (including Rx): No  >>Shift Summary: Patient observed to be pacing, intrusive, and needy. Patient able to make needs known. Patient noted to be manipulative in line with BPD dx. Patient making threats to harm self and roommate, asking for 1:1 observation, and permission to stay in seclusion room.  Patient observed to engage inappropriately with staff and peers. Difficult to redirect. Patient taking medications as prescribed. This shift, PRN medication requested or required. No reported or observed side effects to medication. No reported or observed agitation, aggression, or other acute emotional distress. No reported or observed physical abnormalities or concerns.  Last Vitals  Vitals Weight: 106.7 kg Temp: (!) 97.5 F (36.4 C) Temp Source: Oral Pulse Rate: 88 Resp: 17 BP: (!) 130/93 Patient Position: (not recorded)  Admission Type  Psych Admission Type (Psych Patients Only) Admission Status: Voluntary Date 72 hour document signed : (not recorded) Time 72 hour document signed : (not recorded) Provider Notified (First and Last Name) (see details for LINK to note): (not recorded)   Psychosocial Assessment  Psychosocial Assessment Patient Complaints: Anxiety, Self-harm thoughts Eye Contact: Fair Facial Expression: Anxious Affect: Anxious, Labile, Threatening, Preoccupied Speech: Tangential Interaction: Manipulative, Needy Motor Activity: Other (Comment) (WDL) Appearance/Hygiene: Unremarkable Behavior Characteristics: Cooperative, Anxious Mood: Anxious   Aggressive  Behavior  Targets: (not recorded)   Thought Process  Thought Process Coherency: Disorganized Content: Preoccupation Delusions: Grandeur Perception: Within Defined Limits Hallucination: None reported or observed Judgment: Poor Confusion: None  Danger to Self/Others  Danger to Self Current suicidal ideation?: Passive Description of Suicide Plan: (not recorded) Self-Injurious Behavior: 1 Agreement Not to Harm Self: (not recorded) Description of Agreement: (not recorded) Danger to Others: (not recorded)

## 2024-07-05 NOTE — Group Note (Signed)
 Date:  07/05/2024 Time:  11:21 AM  Group Topic/Focus:  Developing a Wellness Toolbox:   The focus of this group is to help patients develop a wellness toolbox with skills and strategies to promote recovery upon discharge. Orientation:   The focus of this group is to educate the patient on the purpose and policies of crisis stabilization and provide a format to answer questions about their admission.  The group details unit policies and expectations of patients while admitted.  Participation Level:  Did Not Attend  Additional Comments:  Patient did not attend group  Kalayah Leske M Miriah Maruyama 07/05/2024, 11:21 AM

## 2024-07-05 NOTE — Group Note (Signed)
 Date:  07/05/2024 Time:  1:53 PM  Group Topic/Focus: Strength. Chapline Group The focus of this group is to identify what feelings patients have difficulty handling and develop a plan to handle them in a healthier way upon discharge.    Participation Level:  Active  Participation Quality:  Appropriate  Affect:  Appropriate  Cognitive:  Alert and Appropriate  Insight: Appropriate  Engagement in Group:  Engaged  Modes of Intervention:  Support  Additional Comments:  Patient attended  [employer] 07/05/2024, 1:53 PM

## 2024-07-05 NOTE — Group Note (Signed)
 Date:  07/05/2024 Time:  12:02 PM  Group Topic/Focus: Recreational Therapy  Participation Level:  Did Not Attend   Kara Stephens 07/05/2024, 12:02 PM

## 2024-07-05 NOTE — Group Note (Signed)
 Date:  07/05/2024 Time:  9:15 PM  Group Topic/Focus:  Wrap-Up Group:   The focus of this group is to help patients review their daily goal of treatment and discuss progress on daily workbooks.    Participation Level:  Active  Participation Quality:  Appropriate  Affect:  Appropriate  Cognitive:  Appropriate  Insight: Appropriate  Engagement in Group:  Engaged  Modes of Intervention:  Education and Exploration  Additional Comments: Patient attended and participated in group tonight.  She reports that she did not set a goal for herself today.  But today she prayed, she think a lot,communicated with the nursed and used her coping skills to distract herself.  Gwenn Chillington Dacosta 07/05/2024, 9:15 PM

## 2024-07-05 NOTE — Progress Notes (Signed)
 Pt is a 38 year old female admitted to [facility] voluntary for worsening SI, HI, and self harm thoughts. Upon admission, pt presents flat with preoccupied affect. Pt describes recent worsening of suicidal thoughts and intrusive thoughts to harm others, as well as insomnia and decreased appetite. Pt reports she fears being left alone with her 75 year old son due to these thoughts. Pt denies current SI/HI/AVH and verbally contracts for safety. Skin assessment completed with Latoya, MHT and findings were WDL, with pt noted to have scattered tattoos across her body. Belongings searched according to policy and secured in pt locker. Pt oriented to unit and offered food and beverage. Pt remains safe on the unit at this time.

## 2024-07-05 NOTE — BH IP Treatment Plan (Signed)
 Interdisciplinary Treatment and Diagnostic Plan Update  07/05/2024 Time of Session: 1000AM DASHEA MCMULLAN MRN: [id number]  Principal Diagnosis: Bipolar 1 disorder (HCC)  Secondary Diagnoses: Principal Problem:   Bipolar 1 disorder (HCC)   Current Medications:  Current Facility-Administered Medications  Medication Dose Route Frequency Provider Last Rate Last Admin   acetaminophen  (TYLENOL ) tablet 650 mg  650 mg Oral Q6H PRN McLauchlin, Angela, NP       alum & mag hydroxide-simeth (MAALOX/MYLANTA) 200-200-20 MG/5ML suspension 30 mL  30 mL Oral Q4H PRN McLauchlin, Angela, NP       hydrOXYzine  (ATARAX ) tablet 25 mg  25 mg Oral TID PRN McLauchlin, Angela, NP   25 mg at 07/05/24 9364   lamoTRIgine  (LAMICTAL ) tablet 200 mg  200 mg Oral Daily Towana Leita SAILOR, MD   200 mg at 07/05/24 0840   magnesium  hydroxide (MILK OF MAGNESIA) suspension 30 mL  30 mL Oral Daily PRN McLauchlin, Angela, NP       memantine  (NAMENDA ) tablet 10 mg  10 mg Oral BID Towana Leita SAILOR, MD       propranolol  ER (INDERAL  LA) 24 hr capsule 60 mg  60 mg Oral Daily Butler, Laura N, MD   60 mg at 07/05/24 0840   QUEtiapine  (SEROQUEL  XR) 24 hr tablet 250 mg  250 mg Oral QHS Towana Leita SAILOR, MD       QUEtiapine  (SEROQUEL ) tablet 12.5 mg  12.5 mg Oral BID WC Towana Leita SAILOR, MD       traZODone  (DESYREL ) tablet 100 mg  100 mg Oral QHS Butler, Laura N, MD       PTA Medications: Medications Prior to Admission  Medication Sig Dispense Refill Last Dose/Taking   albuterol  (VENTOLIN  HFA) 108 (90 Base) MCG/ACT inhaler Inhale 2 puffs into the lungs every 4 (four) hours as needed for wheezing or shortness of breath (cough, shortness of breath or wheezing.). (Patient not taking: Reported on 07/04/2024) 6.7 g 3    aspirin EC 81 MG tablet Take 81 mg by mouth daily. Swallow whole.      Cholecalciferol  (VITAMIN D -3 PO) Take 1 capsule by mouth daily.      clonazePAM  (KLONOPIN ) 0.5 MG tablet Take 0.5 mg by mouth daily as needed for anxiety.       lamoTRIgine  (LAMICTAL ) 200 MG tablet Take 200 mg by mouth daily.      memantine  (NAMENDA ) 10 MG tablet Take 1 tablet (10 mg total) by mouth every 12 (twelve) hours. 60 tablet 0    metFORMIN  (GLUCOPHAGE -XR) 500 MG 24 hr tablet Take 1 tablet (500 mg total) by mouth daily with breakfast. (Patient taking differently: Take 1,000 mg by mouth daily with breakfast.) 30 tablet 0    Multiple Vitamin (MULTIVITAMIN WITH MINERALS) TABS tablet Take 1 tablet by mouth daily.      propranolol  ER (INDERAL  LA) 60 MG 24 hr capsule Take 1 capsule (60 mg total) by mouth daily. 30 capsule 0    QUEtiapine  (SEROQUEL ) 200 MG tablet Take 200 mg by mouth at bedtime.      traZODone  (DESYREL ) 100 MG tablet Take 1 tablet (100 mg total) by mouth at bedtime as needed for sleep. (Patient taking differently: Take 100-200 mg by mouth at bedtime.) 30 tablet 0     Patient Stressors: Traumatic event    Patient Strengths: Capable of independent living  Manufacturing systems engineer  Motivation for treatment/growth  Supportive family/friends   Treatment Modalities: Medication Management, Group therapy, Case management,  1 to 1 session  with clinician, Psychoeducation, Recreational therapy.   Physician Treatment Plan for Primary Diagnosis: Bipolar 1 disorder (HCC) Long Term Goal(s):     Short Term Goals:    Medication Management: Evaluate patient's response, side effects, and tolerance of medication regimen.  Therapeutic Interventions: 1 to 1 sessions, Unit Group sessions and Medication administration.  Evaluation of Outcomes: Not Progressing  Physician Treatment Plan for Secondary Diagnosis: Principal Problem:   Bipolar 1 disorder (HCC)  Long Term Goal(s):     Short Term Goals:       Medication Management: Evaluate patient's response, side effects, and tolerance of medication regimen.  Therapeutic Interventions: 1 to 1 sessions, Unit Group sessions and Medication administration.  Evaluation of Outcomes: Not  Progressing   RN Treatment Plan for Primary Diagnosis: Bipolar 1 disorder (HCC) Long Term Goal(s): Knowledge of disease and therapeutic regimen to maintain health will improve  Short Term Goals: Ability to remain free from injury will improve, Ability to verbalize frustration and anger appropriately will improve, Ability to demonstrate self-control, Ability to participate in decision making will improve, Ability to verbalize feelings will improve, Ability to disclose and discuss suicidal ideas, Ability to identify and develop effective coping behaviors will improve, and Compliance with prescribed medications will improve  Medication Management: RN will administer medications as ordered by provider, will assess and evaluate patient's response and provide education to patient for prescribed medication. RN will report any adverse and/or side effects to prescribing provider.  Therapeutic Interventions: 1 on 1 counseling sessions, Psychoeducation, Medication administration, Evaluate responses to treatment, Monitor vital signs and CBGs as ordered, Perform/monitor CIWA, COWS, AIMS and Fall Risk screenings as ordered, Perform wound care treatments as ordered.  Evaluation of Outcomes: Not Progressing   LCSW Treatment Plan for Primary Diagnosis: Bipolar 1 disorder (HCC) Long Term Goal(s): Safe transition to appropriate next level of care at discharge, Engage patient in therapeutic group addressing interpersonal concerns.  Short Term Goals: Engage patient in aftercare planning with referrals and resources, Increase social support, Increase ability to appropriately verbalize feelings, Increase emotional regulation, Facilitate acceptance of mental health diagnosis and concerns, Facilitate patient progression through stages of change regarding substance use diagnoses and concerns, Identify triggers associated with mental health/substance abuse issues, and Increase skills for wellness and recovery  Therapeutic  Interventions: Assess for all discharge needs, 1 to 1 time with Social worker, Explore available resources and support systems, Assess for adequacy in community support network, Educate family and significant other(s) on suicide prevention, Complete Psychosocial Assessment, Interpersonal group therapy.  Evaluation of Outcomes: Not Progressing   Progress in Treatment: Attending groups: No. Participating in groups: No. Taking medication as prescribed: Yes. Toleration medication: Yes. Family/Significant other contact made: No, will contact:  consents pending Patient understands diagnosis: Yes. Discussing patient identified problems/goals with staff: Yes. Medical problems stabilized or resolved: Yes. Denies suicidal/homicidal ideation: No. HI and SI. HI towards roommate and others on the unit  Issues/concerns per patient self-inventory: No.  New problem(s) identified: Yes, Describe:  pt requesting a room on [address] hall, constant supervision or to spend time in the quiet room  New Short Term/Long Term Goal(s): medication stabilization, elimination of SI thoughts, development of comprehensive mental wellness plan.    Patient Goals:  Get out of the manic state, lessen the intrusive thoughts of wanting to choke out my roommate, and increase my sleep  Discharge Plan or Barriers: Patient recently admitted. CSW will continue to follow and assess for appropriate referrals and possible discharge planning.    Reason  for Continuation of Hospitalization: Homicidal ideation Medication stabilization Suicidal ideation Other; describe bipolar  Estimated Length of Stay: 5-7 days  Last 3 [place] Suicide Severity Risk Score: Flowsheet Row [facility] (Current) [facility] Most recent reading at 07/04/2024 10:27 PM ED from 07/04/2024 in [facility] Most recent reading at 07/04/2024  3:43 PM Admission (Discharged) [facility]  [facility] Most recent reading at 12/09/2023  3:30 PM  C-SSRS RISK CATEGORY Moderate Risk Moderate Risk No Risk    Last PHQ 2/9 Scores:    08/10/2022    7:45 PM  Depression screen PHQ 2/9  Decreased Interest 3  Down, Depressed, Hopeless 3  PHQ - 2 Score 6  Altered sleeping 3  Tired, decreased energy 1  Change in appetite 3  Feeling bad or failure about yourself  1  Trouble concentrating 2  Moving slowly or fidgety/restless 2  Suicidal thoughts 2  PHQ-9 Score 20  Difficult doing work/chores Very difficult    Scribe for Treatment Team: Jenkins LULLA Morley ISRAEL 07/05/2024 12:26 PM

## 2024-07-05 NOTE — Tx Team (Signed)
 Initial Treatment Plan 07/05/2024 1:51 AM [name] FMW:[id number]    PATIENT STRESSORS: Traumatic event     PATIENT STRENGTHS: Capable of independent living  Communication skills  Motivation for treatment/growth  Supportive family/friends    PATIENT IDENTIFIED PROBLEMS: Suicidal ideation/self harm thoughts  Intrusive thoughts to harm others  Decrease in sleep and appetite                  DISCHARGE CRITERIA:  Improved stabilization in mood, thinking, and/or behavior Motivation to continue treatment in a less acute level of care Verbal commitment to aftercare and medication compliance  PRELIMINARY DISCHARGE PLAN: Outpatient therapy Return to previous living arrangement Return to previous work or school arrangements  PATIENT/FAMILY INVOLVEMENT: This treatment plan has been presented to and reviewed with the patient, Kara Stephens. The patient has been given the opportunity to ask questions and make suggestions.  Rayshawn Visconti C D'Addio, RN 07/05/2024, 1:51 AM

## 2024-07-05 NOTE — H&P (Signed)
 Psychiatric Admission Assessment Adult  Patient Identification: Kara Stephens MRN:  [id number] Date of Evaluation:  07/05/2024 Chief Complaint:  Bipolar 1 disorder (HCC) [F31.9] Principal Diagnosis: Bipolar 1 disorder, mixed, moderate (HCC) Diagnosis:  Principal Problem:   Bipolar 1 disorder, mixed, moderate (HCC)  History of Present Illness:  The patient is a 38 y.o. female (domiciled with husband and children,) with a medical history of pre-diabetes and a psychiatric history most consistent with bipolar 1 disorder and cluster B traits . The patient presented to [facility] on 07/04/24 BIB sister and with 36-year-old son requesting hospitalization to fix her medications. Noting belief that she was beginning to have a manic episode. Endorsing intrusive suicidal thoughts. Labs notable for elevated cholesterol (223) and LDL (150). TSH WNL. A1C 5.4 (was 5.8 7 months ago prior to starting metformin ), CMP and CBC grossly WNL, BAL and UDS negative. She was voluntarily transferred to [facility] for further management.   Psychiatric history: Gained from patient (fairly reliable) and chart review (reliable) The patient's psychiatric history is notable for prior bipolar II diagnosis with approximately 5 years of stability on lamotrigine . Symptoms reoccurred with worsening severity after the birth of her second child at age 50. Since then she has had at least 2 credible manic symptoms with psychosis. Symptoms have included racing thoughts, reduced sleep, impulsivity, pressured speech and psychomotor agitation and have included psychotic symptoms such as hallucinations. In addition to elevated mood states, the patient has had recurrent depressive symptoms characterized by low mood, anhedonia, irritability, low energy and negative intrusive thoughts. She has had 4 hospitalizations in the last 2-3 years for depression and mania: March 2025 for mania, February 2025 for mania, December 2023 bipolar mixed episode with catatonic  features and OCD. She was most recently admitted from 12/09/23-12/14/23 at [facility] self-admitted largely for anxiety symptoms, feeling that her mania was not well controlled. She has no prior suicide attempts. She has been on numerous medications for management of mood symptoms as noted below with numerous side effects/intolerances or poor effects. She does present with instability in relationships, poor coping, recurrent SI, affective lability concerning for cluster B traits confounding mood symptoms. Patient has no prior suicide attempts. Currently follows outpatient with Dr. Chyrl Na for med management and she sees Velinda Beat for therapy. Current medications include: lamotrigine  200 mg daily, seroquel  200 mg at bedtime, namenda  10 mg BID, trazodone  100-200 mg at bedtime.   Recent events: Today the patient reports that she is not good. States that her mania is not well controlled. Over the past 1-2 weeks she has been noting reduced sleep (5 hrs a night) and rapidly cycling thoughts. She is also having a lot of intrusive thoughts about harming herself and others. States she will think about suicide or things like dropping the toddler. She does not actually want to act on these, but the intrusive thoughts are distressing and she is afraid of what she might do. Does note that symptoms correspond to moving in to house-sit mother-in-laws house and that she has had some increased stress and disruption in her sleep schedule. Noting that with a toddler it is difficult for her to get a good night's rest. Also reports that her 13 year old daughter was recently hospitalized out of concern for suicidal thoughts after taking too many allergy medications (daughter had reported it was due to [facility] and wanting get high). Overall she does not actually want to end her life or the lives of anyone around her. However numerous times throughout the interview  requests to be put in an isolation room because she is having intrusive  thoughts of killing her roommate. Discussed at length intrusive thoughts as being often anxiety or OCD based and patient having no actual intent or desire to act on this. She was requesting some medication changes to help address these symptoms and to help her sleep.    [place] Scale:  Flowsheet Row [facility] (Current[facility] Most recent reading at 07/04/2024 10:27 PM ED from 07/04/2024 in [facility] Most recent reading at 07/04/2024  3:43 PM Admission (Discharged) [facility] Most recent reading at 12/09/2023  3:30 PM  C-SSRS RISK CATEGORY Moderate Risk Moderate Risk No Risk     Alcohol Screening: 1. How often do you have a drink containing alcohol?: Monthly or less 2. How many drinks containing alcohol do you have on a typical day when you are drinking?: 1 or 2 3. How often do you have six or more drinks on one occasion?: Never AUDIT-C Score: 1 4. How often during the last year have you found that you were not able to stop drinking once you had started?: Never 5. How often during the last year have you failed to do what was normally expected from you because of drinking?: Never 6. How often during the last year have you needed a first drink in the morning to get yourself going after a heavy drinking session?: Never 7. How often during the last year have you had a feeling of guilt of remorse after drinking?: Never 8. How often during the last year have you been unable to remember what happened the night before because you had been drinking?: Never 9. Have you or someone else been injured as a result of your drinking?: No 10. Has a relative or friend or a doctor or another health worker been concerned about your drinking or suggested you cut down?: No Alcohol Use Disorder Identification Test Final Score (AUDIT): 1 Alcohol Brief Interventions/Follow-up:  Alcohol education/Brief advice  Previous Psychotropic Medications:  -lamotrigine  -klonopin  -lithium -Depakote  -topirimate -zoloft (precipitated mania) -prozac  -venlaraxine (aggression) -lurasidone (muscle spasms, drooling) -Abilify (muscle spasms, drooling) -zyprexa  (reportedly caused mania?) -Seroquel  -memantine   Past Medical History:  Past Medical History:  Diagnosis Date   Anxiety    Back spasm    Bipolar I disorder (HCC)    Borderline personality disorder (HCC)    Class 3 obesity (HCC)    Cluster B personality disorder (HCC)    Hypercholesterolemia    Hypertension    Hypertriglyceridemia    Somatic symptom disorder    Tachycardia    Type 2 diabetes mellitus (HCC)    Vitamin D  insufficiency     Past Surgical History:  Procedure Laterality Date   APPENDECTOMY     CHOLECYSTECTOMY     HAND SURGERY     SHOULDER SURGERY     Family History:  Family History  Problem Relation Age of Onset   Hypertension Mother    Hyperlipidemia Mother    Suicidality Father    Depression Father    Family Psychiatric  History: daughter with depression, hospitalizations for mood/psychosis Tobacco Screening:  Social History   Tobacco Use  Smoking Status Every Day   Current packs/day: 0.50   Average packs/day: 0.5 packs/day for 5.7 years (2.8 ttl pk-yrs)   Types: Cigarettes   Start date: 11/2018   Last attempt to quit: 09/10/2016  Smokeless Tobacco Never  BH Tobacco Counseling     Are you interested in Tobacco Cessation Medications?  No, patient refused Counseled patient on smoking cessation:  Refused/Declined practical counseling Reason Tobacco Screening Not Completed: No value filed.       Social History:  Social History   Substance and Sexual Activity  Alcohol Use No     Social History   Substance and Sexual Activity  Drug Use No    Additional Social History: Domiciled with husband and 2 children, currently living at [employer]. Denies substance  use concerns. Not currently working (taking care of kids).   Allergies:   Allergies  Allergen Reactions   Effexor [Venlafaxine] Other (See Comments)    Pt reports becoming aggressive while taking this medication.   Olanzapine  Other (See Comments)    Pt reports becoming Manic while taking this drug.   Sulfa Antibiotics Anaphylaxis and Swelling   Tramadol Other (See Comments)    Manic   Trilafon [Perphenazine] Other (See Comments)    Pt reports becoming manic while taking this medication.   Cortisone Other (See Comments)    Pain   Abilify [Aripiprazole] Other (See Comments)    Psychosis, muscle spasms, drooling, agitiation     Depakote  [Valproic Acid] Other (See Comments)    Flu like symptoms, worsens mania   Hydrocodone Other (See Comments)    Mania   Latuda [Lurasidone] Other (See Comments)    Psychosis, muscle spasms, agitation, drooling, incontinence     Sertraline Other (See Comments)    Mania   Clindamycin/Lincomycin Itching, Rash and Other (See Comments)    Flushing   Lab Results:  Results for orders placed or performed during the hospital encounter of 07/04/24 (from the past 48 hours)  CBC with Differential/Platelet     Status: Abnormal   Collection Time: 07/04/24  3:58 PM  Result Value Ref Range   WBC 13.9 (H) 4.0 - 10.5 K/uL   RBC 5.00 3.87 - 5.11 MIL/uL   Hemoglobin 14.3 12.0 - 15.0 g/dL   HCT 56.7 63.9 - 53.9 %   MCV 86.4 80.0 - 100.0 fL   MCH 28.6 26.0 - 34.0 pg   MCHC 33.1 30.0 - 36.0 g/dL   RDW 85.5 88.4 - 84.4 %   Platelets 338 150 - 400 K/uL   nRBC 0.0 0.0 - 0.2 %   Neutrophils Relative % 69 %   Neutro Abs 9.6 (H) 1.7 - 7.7 K/uL   Lymphocytes Relative 25 %   Lymphs Abs 3.5 0.7 - 4.0 K/uL   Monocytes Relative 5 %   Monocytes Absolute 0.6 0.1 - 1.0 K/uL   Eosinophils Relative 0 %   Eosinophils Absolute 0.0 0.0 - 0.5 K/uL   Basophils Relative 0 %   Basophils Absolute 0.0 0.0 - 0.1 K/uL   Immature Granulocytes 1 %   Abs Immature Granulocytes 0.07  0.00 - 0.07 K/uL    Comment: Performed at [facility], [address], [place], [place] [address]  Comprehensive metabolic panel     Status: None   Collection Time: 07/04/24  3:58 PM  Result Value Ref Range   Sodium 139 135 - 145 mmol/L   Potassium 4.1 3.5 - 5.1 mmol/L   Chloride 101 98 - 111 mmol/L   CO2 25 22 - 32 mmol/L   Glucose, Bld 94 70 - 99 mg/dL    Comment: Glucose reference range applies only to samples taken after fasting for at least 8 hours.   BUN 9 6 -  20 mg/dL   Creatinine, Ser 9.03 0.44 - 1.00 mg/dL   Calcium 89.6 8.9 - 89.6 mg/dL   Total Protein 7.7 6.5 - 8.1 g/dL   Albumin 4.8 3.5 - 5.0 g/dL   AST 26 15 - 41 U/L   ALT 35 0 - 44 U/L   Alkaline Phosphatase 66 38 - 126 U/L   Total Bilirubin 0.6 0.0 - 1.2 mg/dL   GFR, Estimated >39 >39 mL/min    Comment: (NOTE) Calculated using the CKD-EPI Creatinine Equation (2021)    Anion gap 13 5 - 15    Comment: Performed at [facility], [address] [place], [place] [address]  Hemoglobin A1c     Status: None   Collection Time: 07/04/24  3:58 PM  Result Value Ref Range   Hgb A1c MFr Bld 5.4 4.8 - 5.6 %    Comment: (NOTE) Diagnosis of Diabetes The following HbA1c ranges recommended by the American Diabetes Association (ADA) may be used as an aid in the diagnosis of diabetes mellitus.  Hemoglobin             Suggested A1C NGSP%              Diagnosis  <5.7                   Non Diabetic  5.7-6.4                Pre-Diabetic  >6.4                   Diabetic  <7.0                   Glycemic control for                       adults with diabetes.     Mean Plasma Glucose 108.28 mg/dL    Comment: Performed at [facility], [address] [place], [place] [address]  Magnesium      Status: None   Collection Time: 07/04/24  3:58 PM  Result Value Ref Range   Magnesium  1.9 1.7 - 2.4 mg/dL    Comment: Performed at [facility], [address], [place], [place] [address]  Ethanol     Status:  None   Collection Time: 07/04/24  3:58 PM  Result Value Ref Range   Alcohol, Ethyl (B) <15 <15 mg/dL    Comment: (NOTE) For medical purposes only. Performed at [facility], [address], [place], [place] [address]   Lipid panel     Status: Abnormal   Collection Time: 07/04/24  3:58 PM  Result Value Ref Range   Cholesterol 223 (H) 0 - 200 mg/dL   Triglycerides 899 <849 mg/dL   HDL 53 >59 mg/dL   Total CHOL/HDL Ratio 4.2 RATIO   VLDL 20 0 - 40 mg/dL   LDL Cholesterol 849 (H) 0 - 99 mg/dL    Comment:        Total Cholesterol/HDL:CHD Risk Coronary Heart Disease Risk Table                     Men   Women  1/2 Average Risk   3.4   3.3  Average Risk       5.0   4.4  2 X Average Risk   9.6   7.1  3 X Average Risk  23.4   11.0  Use the calculated Patient Ratio above and the CHD Risk Table to determine the patient's CHD Risk.        ATP III CLASSIFICATION (LDL):  <100     mg/dL   Optimal  899-870  mg/dL   Near or Above                    Optimal  130-159  mg/dL   Borderline  839-810  mg/dL   High  >809     mg/dL   Very High Performed at [facility], [address] [place], [place] [address]   TSH     Status: None   Collection Time: 07/04/24  3:58 PM  Result Value Ref Range   TSH 3.109 0.350 - 4.500 uIU/mL    Comment: Performed by a 3rd Generation assay with a functional sensitivity of <=0.01 uIU/mL. Performed at [facility], [address] [place], [place] [address]   Urinalysis, Routine w reflex microscopic -Urine, Clean Catch     Status: Abnormal   Collection Time: 07/04/24  3:58 PM  Result Value Ref Range   Color, Urine YELLOW YELLOW   APPearance CLOUDY (A) CLEAR   Specific Gravity, Urine >1.030 (H) 1.005 - 1.030   pH 6.0 5.0 - 8.0   Glucose, UA NEGATIVE NEGATIVE mg/dL   Hgb urine dipstick SMALL (A) NEGATIVE   Bilirubin Urine NEGATIVE NEGATIVE   Ketones, ur NEGATIVE NEGATIVE mg/dL   Protein, ur NEGATIVE NEGATIVE mg/dL   Nitrite  NEGATIVE NEGATIVE   Leukocytes,Ua MODERATE (A) NEGATIVE    Comment: Performed at [facility], [address] [place], [place] [address]  Urinalysis, Microscopic (reflex)     Status: Abnormal   Collection Time: 07/04/24  3:58 PM  Result Value Ref Range   RBC / HPF 0-5 0 - 5 RBC/hpf   WBC, UA 6-10 0 - 5 WBC/hpf   Bacteria, UA MANY (A) NONE SEEN   Squamous Epithelial / HPF 6-10 0 - 5 /HPF    Comment: Performed at [facility], [address] [place], [place] [address]  Pregnancy, urine POC     Status: None   Collection Time: 07/04/24  4:09 PM  Result Value Ref Range   Preg Test, Ur NEGATIVE NEGATIVE    Comment:        THE SENSITIVITY OF THIS METHODOLOGY IS >20 mIU/mL.   POCT Urine Drug Screen - (I-Screen)     Status: Normal   Collection Time: 07/04/24  4:17 PM  Result Value Ref Range   POC Amphetamine UR None Detected NONE DETECTED (Cut Off Level 1000 ng/mL)   POC Secobarbital (BAR) None Detected NONE DETECTED (Cut Off Level 300 ng/mL)   POC Buprenorphine (BUP) None Detected NONE DETECTED (Cut Off Level 10 ng/mL)   POC Oxazepam (BZO) None Detected NONE DETECTED (Cut Off Level 300 ng/mL)   POC Cocaine UR None Detected NONE DETECTED (Cut Off Level 300 ng/mL)   POC Methamphetamine UR None Detected NONE DETECTED (Cut Off Level 1000 ng/mL)   POC Morphine None Detected NONE DETECTED (Cut Off Level 300 ng/mL)   POC Methadone UR None Detected NONE DETECTED (Cut Off Level 300 ng/mL)   POC Oxycodone UR None Detected NONE DETECTED (Cut Off Level 100 ng/mL)   POC Marijuana UR None Detected NONE DETECTED (Cut Off Level 50 ng/mL)  POC urine preg, ED     Status: Normal   Collection Time: 07/04/24  4:19 PM  Result Value Ref Range   Preg Test, Ur Negative Negative    Blood Alcohol level:  Lab Results  Component Value Date   [facility] <15 07/04/2024   ETH <10 08/10/2022    Metabolic Disorder Labs:  Lab Results  Component Value Date   HGBA1C 5.4 07/04/2024   MPG 108.28 07/04/2024    MPG 119.76 12/07/2023   No results found for: PROLACTIN Lab Results  Component Value Date   CHOL 223 (H) 07/04/2024   TRIG 100 07/04/2024   HDL 53 07/04/2024   CHOLHDL 4.2 07/04/2024   VLDL 20 07/04/2024   LDLCALC 150 (H) 07/04/2024   LDLCALC 92 12/07/2023    Current Medications: Current Facility-Administered Medications  Medication Dose Route Frequency Provider Last Rate Last Admin   acetaminophen  (TYLENOL ) tablet 650 mg  650 mg Oral Q6H PRN McLauchlin, Angela, NP       alum & mag hydroxide-simeth (MAALOX/MYLANTA) 200-200-20 MG/5ML suspension 30 mL  30 mL Oral Q4H PRN McLauchlin, Angela, NP       hydrOXYzine  (ATARAX ) tablet 25 mg  25 mg Oral TID PRN McLauchlin, Angela, NP   25 mg at 07/05/24 9364   lamoTRIgine  (LAMICTAL ) tablet 200 mg  200 mg Oral Daily Towana Leita SAILOR, MD   200 mg at 07/05/24 0840   magnesium  hydroxide (MILK OF MAGNESIA) suspension 30 mL  30 mL Oral Daily PRN McLauchlin, Jon, NP       memantine  (NAMENDA ) tablet 10 mg  10 mg Oral BID Towana Leita SAILOR, MD       [START ON 07/06/2024] metFORMIN  (GLUCOPHAGE ) tablet 500 mg  500 mg Oral Q breakfast Towana Leita SAILOR, MD       propranolol  ER (INDERAL  LA) 24 hr capsule 60 mg  60 mg Oral Daily Towana Leita SAILOR, MD   60 mg at 07/05/24 0840   QUEtiapine  (SEROQUEL  XR) 24 hr tablet 250 mg  250 mg Oral QHS Towana Leita SAILOR, MD       QUEtiapine  (SEROQUEL ) tablet 12.5 mg  12.5 mg Oral BID WC Ira Busbin N, MD   12.5 mg at 07/05/24 1259   traZODone  (DESYREL ) tablet 100 mg  100 mg Oral QHS Towana Leita SAILOR, MD       PTA Medications: Medications Prior to Admission  Medication Sig Dispense Refill Last Dose/Taking   albuterol  (VENTOLIN  HFA) 108 (90 Base) MCG/ACT inhaler Inhale 2 puffs into the lungs every 4 (four) hours as needed for wheezing or shortness of breath (cough, shortness of breath or wheezing.). (Patient not taking: Reported on 07/04/2024) 6.7 g 3    aspirin EC 81 MG tablet Take 81 mg by mouth daily. Swallow whole.       Cholecalciferol  (VITAMIN D -3 PO) Take 1 capsule by mouth daily.      clonazePAM  (KLONOPIN ) 0.5 MG tablet Take 0.5 mg by mouth daily as needed for anxiety.      lamoTRIgine  (LAMICTAL ) 200 MG tablet Take 200 mg by mouth daily.      memantine  (NAMENDA ) 10 MG tablet Take 1 tablet (10 mg total) by mouth every 12 (twelve) hours. 60 tablet 0    metFORMIN  (GLUCOPHAGE -XR) 500 MG 24 hr tablet Take 1 tablet (500 mg total) by mouth daily with breakfast. (Patient taking differently: Take 1,000 mg by mouth daily with breakfast.) 30 tablet 0    Multiple Vitamin (MULTIVITAMIN WITH MINERALS) TABS tablet Take 1 tablet by mouth daily.      propranolol  ER (INDERAL  LA) 60 MG 24 hr capsule Take  1 capsule (60 mg total) by mouth daily. 30 capsule 0    QUEtiapine  (SEROQUEL ) 200 MG tablet Take 200 mg by mouth at bedtime.      traZODone  (DESYREL ) 100 MG tablet Take 1 tablet (100 mg total) by mouth at bedtime as needed for sleep. (Patient taking differently: Take 100-200 mg by mouth at bedtime.) 30 tablet 0     Mental Status exam: Appearance: white female of slightly elevated BMI, appropriately groomed in casual clothing, seen sitting in her room  Eye contact: good  Attitude towards examiner: cooperative  Psychomotor: no agitation or retardation  Speech: normal in rate and prosody; meandering and increased amount. Not pressured  Language: no delays  Mood: not good  Affect: congruent, restricted and anxious - not labile  Thought content: denying SI and HI (reporting intrusive thoughts but no desire or intent to act on thoughts), no delusions expressed  Thought Process: linear, organized and goal-directed  Perception: denying AVH, not RTIS  Insight: fair  Judgement: fair   Orientation: x3 Attention/Concentration: good - attends to interview  Memory/Cognition: grossly intact   Fund of Knowledge: Average    Musculoskeletal: Strength & Muscle Tone: within normal limits Gait & Station: normal Patient leans:  N/A    Physical Exam Constitutional:      Appearance: Normal appearance.  HENT:     Head: Normocephalic and atraumatic.  Abdominal:     General: There is no distension.  Musculoskeletal:     Cervical back: Normal range of motion.  Neurological:     General: No focal deficit present.     Mental Status: She is alert.    Review of Systems  All other systems reviewed and are negative.  Blood pressure (!) 133/94, pulse 98, temperature (!) 97.5 F (36.4 C), temperature source Oral, resp. rate 17, height 5' 6 (1.676 m), weight 106.7 kg, SpO2 97%. Body mass index is 37.96 kg/m.  Treatment Plan Summary: Daily contact with patient to assess and evaluate symptoms and progress in treatment  Assessment: The patient is a 38 y.o. female with a psychiatric history most consistent with bipolar 1 disorder, current episode mixed and cluster B traits. She had carried a bipolar II diagnosis through much of adult life with worsening of symptoms occurring post-partum at age 66. Since then she has had 4 psychiatric admissions, 2 for credible manic episodes with psychosis. She has additionally presented with recurrent depressive episodes, although no suicide attempts. Mood symptoms are confounded by cluster B traits with clear history of unstable relationships, affective lability, poor coping, recurrent SI. On this admission the patient presents largely with concerns related to high anxiety and intrusive thoughts. Although reporting concerns for mania, aside from mildly reduced sleep (5 hrs) and rapidly cycling thoughts she does not present with endorsed active manic symptoms and does not present with observable signs of mania. She was restricted, mildly anxious in affect (no lability or elevations), demonstrated linear TP and with normal rate of speech. Given this admission was precipitated by several recent psychosocial stressors, personality pathology may be contributing to worsening of symptoms  to some  degree. Regardless, she has endorsed significant distress with current intrusive thoughts of harm to self/others (no actual active SI or HI, just ego-dystonic intrusions), several times requesting isolation room to be kept away from others.   After discussion about current medications and available alternatives we will move to continue lamotrigine  200 mg daily, trazodone  100 mg at bedtime and namenda  10 mg BID unchanged. Evening seroquel  will  be increased from 200 mg to 250 mg to address sleep concerns and an additional IR 12.5 with breakfast and lunch will be scheduled to aid in anxious/intrusive thoughts. Discussed in detail with patient intrusive thoughts not being responsive to medication and goals of hospital will be to reduce distress and work on coping skill to manage these. She voiced understanding. Given high stress related to roommate situation will transfer her to 500 hall individual room this evening.   DSM-5 diagnoses: Bipolar I disorder, current episode mixed, no current psychotic features  Cluster B traits   -r/o BPD    Plan:  Legal Status: -voluntary   Safety -q15 minute checks  -elopement, suicide and assault precautions  -daily vitals  Psychiatric Concerns  -Continue Lamotrigine  200 mg daily for mood  -Continue Namenda  10 mg BID for reported intrusive thoughts (patient takes at home and reports benefit) -Continue Trazodone  100 mg at bedtime for sleep -Increase Seroquel  XR to 250 mg at bedtime for sleep and mood stabilization -Begin Seroquel  IR 12.5 mg with breakfast and lunch to address high anxiety/intrusive thoughts   Substance use concerns  -none currently   Nicotine  Replacement  N/A   Medical concerns Pre-diabetes -Current A1c WNL (5.4) but was elevated 7 months ago prior to beginning metformin  -Restart metformin  500 mg daily    Additional PRNs: -Tylenol  tablets 650 mg every 6 hours as needed for pain -Maalox/Mylanta suspension 30 mL every 4 hours as  needed for indigestion  -Milk of Magnesia 30 mL daily as needed for constipation  Labs -Reviewed as documented in HPI  Psychosocial interventions  -daily medication management with psychiatry -Medication education regarding risks/benefits and alternatives -bedside psychotherapy as indicated  -Patient will be encouraged to participate and engage with group therapy  -Appreciate SW assistance in coordinating safe disposition    I certify that inpatient services furnished can reasonably be expected to improve the patient's condition.    Leita LOISE Arts, MD 10/29/20251:13 PM
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
## 2024-07-05 NOTE — BHH Counselor (Signed)
 Adult Comprehensive Assessment  Patient ID: Kara Stephens, female   DOB: April 09, 1986, 38 y.o.   MRN: [id number]  Information Source: Information source: Patient  Current Stressors:  Patient states their primary concerns and needs for treatment are:: mania symptoms, intrusive thoughts to get out of both of those. Patient states their goals for this hospitilization and ongoing recovery are:: to stop having suicidal thoughts. 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 Family Relationships: good Surveyor, Quantity / Lack of resources (include bankruptcy): my husband works Engineer, Civil (consulting) / Lack of housing: lives in [place] Physical health (include injuries & life threatening diseases): insomnia Social relationships: denies Substance abuse: denies Bereavement / Loss: denies  Living/Environment/Situation:  Living Arrangements: Spouse/significant other, Children Who else lives in the home?: children and husband  Family History:  Marital status: Married What types of issues is patient dealing with in the relationship?: No concerns noted, describes husband as supportive. Additional relationship information: NA Are you sexually active?: Yes What is your sexual orientation?: heterosexual Has your sexual activity been affected by drugs, alcohol, medication, or emotional stress?: no Does patient have children?: Yes How many children?: 3 How is patient's relationship with their children?: Good, 30 y.o, 70 y.o. and 2 y.o son. No concerns noted.  Childhood History:  By whom was/is the patient raised?: Both parents Additional childhood history information: unable to assess as she responded with I want to go to 500 hall or the quiet room Description of patient's relationship with caregiver when they were a child: unable to assess as she responded with I want to go to 500 hall or the quiet room Patient's description of current relationship with  people who raised him/her: unable to assess as she responded with I want to go to 500 hall or the quiet room How were you disciplined when you got in trouble as a child/adolescent?: unable to assess as she responded with I want to go to 500 hall or the quiet room Does patient have siblings?: No Did patient suffer any verbal/emotional/physical/sexual abuse as a child?: No Has patient ever been sexually abused/assaulted/raped as an adolescent or adult?: No Was the patient ever a victim of a crime or a disaster?: No Witnessed domestic violence?: No Has patient been affected by domestic violence as an adult?: No  Education:  Highest grade of school patient has completed: unable to assess as she responded with I want to go to 500 hall or the quiet room  Employment/Work Situation:   Employment Situation: Unemployed Has Patient ever Been in Equities Trader?: No  Financial Resources:      Alcohol/Substance Abuse:   What has been your use of drugs/alcohol within the last 12 months?: unable to assess as she responded with I want to go to 500 hall or the quiet room  Social Support System:   Describe Community Support System: unable to assess as she responded with I want to go to 500 hall or the quiet room Type of faith/religion: unable to assess as she responded with I want to go to 500 hall or the quiet room How does patient's faith help to cope with current illness?: unable to assess as she responded with I want to go to 500 hall or the quiet room  Leisure/Recreation:   Do You Have Hobbies?: Yes Leisure and Hobbies: being a mom, walking dogs (I have 2 big dogs), listening to music and cooking per EHR.  Strengths/Needs:   What is the patient's perception  of their strengths?: unable to assess as she responded with I want to go to 500 hall or the quiet room Patient states they can use these personal strengths during their treatment to contribute to their recovery: unable to assess  as she responded with I want to go to 500 hall or the quiet room Patient states these barriers may affect/interfere with their treatment: unable to assess as she responded with I want to go to 500 hall or the quiet room Patient states these barriers may affect their return to the community: unable to assess as she responded with I want to go to 500 hall or the quiet room Other important information patient would like considered in planning for their treatment: unable to assess as she responded with I want to go to 500 hall or the quiet room  Discharge Plan:   Currently receiving community mental health services: No Patient states concerns and preferences for aftercare planning are: to get my meds Patient states they will know when they are safe and ready for discharge when: I stop having these thoughts Does patient have access to transportation?: Yes Does patient have financial barriers related to discharge medications?: No Will patient be returning to same living situation after discharge?: Yes  Summary/Recommendations:   Summary and Recommendations (to be completed by the evaluator): Kara Stephens is a 39 yr old woman that was admitted into [facility] on 07/04/2024 for suicidal thoughts, mania and intrusive thoughts.  She reports that this is not her first admission, she was here about 6 months ago.  She denies triggers and reports that she wants to go to the 500 hall or the quiet room therefore CSW was unable to fully assess her. While here, [place] can benefit from crisis stabilization, medication management, therapeutic milieu, and referrals for services.   Nat CHRISTELLA Salter. 07/05/2024

## 2024-07-05 NOTE — Plan of Care (Signed)
   Problem: Education: Goal: Knowledge of [facility] Education information/materials will improve Outcome: Progressing Goal: Emotional status will improve Outcome: Progressing Goal: Mental status will improve Outcome: Progressing Goal: Verbalization of understanding the information provided will improve Outcome: Progressing

## 2024-07-06 DIAGNOSIS — F3162 Bipolar disorder, current episode mixed, moderate: Secondary | ICD-10-CM | POA: Diagnosis not present

## 2024-07-06 DIAGNOSIS — F429 Obsessive-compulsive disorder, unspecified: Secondary | ICD-10-CM

## 2024-07-06 MED ORDER — HALOPERIDOL 5 MG PO TABS: 5.0000 mg | ORAL_TABLET | Freq: Four times a day (QID) | ORAL | Status: DC | PRN

## 2024-07-06 MED ORDER — MELATONIN 3 MG PO TABS
3.0000 mg | ORAL_TABLET | Freq: Every day | ORAL | Status: DC
  Administered 2024-07-06 – 2024-07-25 (×20): 3 mg via ORAL
  Filled 2024-07-06 (×21): qty 1

## 2024-07-06 MED ORDER — QUETIAPINE FUMARATE 25 MG PO TABS
25.0000 mg | ORAL_TABLET | Freq: Two times a day (BID) | ORAL | Status: DC
  Administered 2024-07-06 – 2024-07-08 (×4): 25 mg via ORAL
  Filled 2024-07-06 (×4): qty 1

## 2024-07-06 MED ORDER — HALOPERIDOL LACTATE 5 MG/ML IJ SOLN
5.0000 mg | Freq: Four times a day (QID) | INTRAMUSCULAR | Status: DC | PRN
  Administered 2024-07-06: 5 mg via INTRAMUSCULAR
  Filled 2024-07-06: qty 1

## 2024-07-06 NOTE — BHH Group Notes (Signed)
 BHH Assessment Progress Note     Pt attended group today

## 2024-07-06 NOTE — Progress Notes (Signed)
 Patient was walking in the hall and out of nowhere punch the window at the nursing station three times. Saying no one believes her and it's not OCD but she is manic. Staff let her go to the padded room where sat on the floor and talked to nurse. Provider notified of behavior and prn medication ordered and given for agitated.

## 2024-07-06 NOTE — Progress Notes (Signed)
 Pt reports anxiety 5/10, states like I was trying to sleep because I feel tired but then I feel I have hyper feeling or manic symptoms. I have fearful thoughts like I am going to die. Pt requested for writer to speak with provider about getting stronger medication for anxiety. Offered PRN vistaril  for anxiety as ordered. No furthers concerns. Pt remains safe on the unit.

## 2024-07-06 NOTE — Progress Notes (Signed)
 Conversation with patient:  Psychiatrist: Chyrl Na, MD, [facility], [address] [place], [place], [place] [address], [phone].   Therapist: Evalene Jama Beat (saw him once), [facility] in [place].   Efstathios Sawin, LCSWA 07/06/2024

## 2024-07-06 NOTE — BHH Group Notes (Signed)
 The focus of this group is to help patients establish daily goals to achieve during treatment and discuss how the patient can incorporate goal setting into their daily lives to aide in recovery.    Scale 1-10 2    Goal: Get rest and communicate with staff for anything I my need
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
## 2024-07-06 NOTE — Plan of Care (Signed)
   Problem: Education: Goal: Emotional status will improve Outcome: Progressing Goal: Mental status will improve Outcome: Progressing Goal: Verbalization of understanding the information provided will improve Outcome: Progressing

## 2024-07-06 NOTE — Plan of Care (Signed)

## 2024-07-06 NOTE — Progress Notes (Signed)
 [facility] MD Progress Note  07/06/2024 12:33 PM Kara Stephens  MRN:  [id number]  Principal Problem: Bipolar 1 disorder, mixed, moderate (HCC) Diagnosis: Principal Problem:   Bipolar 1 disorder, mixed, moderate (HCC) Active Problems:   Obsessive compulsive disorder  Total Time spent with patient: 35 minutes   Identifying Information and Past Psychiatric History:  The patient is a 38 y.o. female (domiciled with husband and children,) with a medical history of pre-diabetes and a psychiatric history most consistent with bipolar 1 disorder, OCD and cluster B traits with OCD to be ruled out, currently admitted due to high anxiety related to intrusive thoughts of suicide (without actual SI).   Psychiatric history notable for prior bipolar II diagnosis with approximately 5 years of stability on lamotrigine . Symptoms reoccurred with worsening severity after the birth of her second child at age 12. Since then she has had at least 2 credible manic symptoms with psychosis. Symptoms have included racing thoughts, reduced sleep, impulsivity, pressured speech and psychomotor agitation and have included psychotic symptoms such as hallucinations. In addition to elevated mood states, the patient has had recurrent depressive symptoms characterized by low mood, anhedonia, irritability, low energy and negative intrusive thoughts. She has had 4 hospitalizations in the last 2-3 years for depression and mania: March 2025 for mania, February 2025 for mania, December 2023 bipolar mixed episode with catatonic features and OCD. She was most recently admitted from 12/09/23-12/14/23 at [facility] self-admitted largely for anxiety symptoms, feeling that her mania was not well controlled. She has no prior suicide attempts. She has been on numerous medications for management of mood symptoms as noted below with numerous side effects/intolerances or poor effects. She does present with instability in relationships, poor coping, recurrent SI,  affective lability concerning for cluster B traits confounding mood symptoms. Patient has no prior suicide attempts. Currently follows outpatient with Dr. Chyrl Na for med management and she sees Velinda Beat for therapy.   Interval events: The patient was assessed at [facility] yesterday with diagnoses as noted above and below. Evening quetiapine  was increased from 200 mg to 250 and additional low-dose quetiapine  IR 12.5 BID with breakfast and lunch was added to address high anxiety, intrusive thoughts, and subjective feelings of mania (rapidly cycling thoughts). Patient was moved to 500 hall due to distressing intrusive thoughts of harming roommate (no actual HI). Overnight documented to be intrusive and needy, was compliant with medications, did attend one group in the afternoon. Slept 7.5 hrs overnight. This morning noted to be making a number of statements about intrusive thoughts of harm to self, to others and wanting a 1:1 sitter.   Interview today: Today the patient reports that she is not good and provides a list of ongoing intrusive thoughts she has been having. States she has been in isolation at times by request and has requested a 1:1. This dino discussed for some time goals of treatment being to push through some of the distress and to work on alternative coping skills in addition to medications. Voiced understanding. Overall not feeling safe to be alone due to what she might do to herself. Notes she also had intrusive thoughts during mealtime about yelling or punching other patients. She did not act on these and agrees that she has no desire to do any of this. She has no desire to act on any of her intrusive thoughts. Recommended continuing to hold onto the fact that she has not acted before and is unlikely to act on these, despite how distressing  they are and to utilize distraction tactics such as talking to others, journaling or coloring. We discussed additional potential medication options,  this author recommended slight bump to daytime seroquel  before adding any additional medications at this time. She was agreeable. No SI, HI or AVH (although continues to report intrusive thoughts of harm to self/others).   Past Medical History:  Past Medical History:  Diagnosis Date   Anxiety    Back spasm    Bipolar I disorder (HCC)    Borderline personality disorder (HCC)    Class 3 obesity (HCC)    Cluster B personality disorder (HCC)    Hypercholesterolemia    Hypertension    Hypertriglyceridemia    Somatic symptom disorder    Tachycardia    Type 2 diabetes mellitus (HCC)    Vitamin D  insufficiency     Past Surgical History:  Procedure Laterality Date   APPENDECTOMY     CHOLECYSTECTOMY     HAND SURGERY     SHOULDER SURGERY     Family History:  Family History  Problem Relation Age of Onset   Hypertension Mother    Hyperlipidemia Mother    Suicidality Father    Depression Father    Social History:  Social History   Substance and Sexual Activity  Alcohol Use No     Social History   Substance and Sexual Activity  Drug Use No    Social History   Socioeconomic History   Marital status: Married    Spouse name: Not on file   Number of children: Not on file   Years of education: Not on file   Highest education level: Not on file  Occupational History   Not on file  Tobacco Use   Smoking status: Every Day    Current packs/day: 0.50    Average packs/day: 0.5 packs/day for 5.7 years (2.8 ttl pk-yrs)    Types: Cigarettes    Start date: 11/2018    Last attempt to quit: 09/10/2016   Smokeless tobacco: Never  Vaping Use   Vaping status: Never Used  Substance and Sexual Activity   Alcohol use: No   Drug use: No   Sexual activity: Not on file  Other Topics Concern   Not on file  Social History Narrative   Not on file   Social Drivers of Health   Financial Resource Strain: Not on file  Food Insecurity: Food Insecurity Present (07/04/2024)   Hunger Vital  Sign    Worried About Running Out of Food in the Last Year: Sometimes true    Ran Out of Food in the Last Year: Never true  Transportation Needs: No Transportation Needs (07/04/2024)   PRAPARE - Administrator, Civil Service (Medical): No    Lack of Transportation (Non-Medical): No  Physical Activity: Not on file  Stress: Stress Concern Present (11/16/2023)   Received from [facility] - Occupational Stress Questionnaire    Feeling of Stress : Very much  Social Connections: Unknown (01/10/2022)   Received from [facility]   Social Network    Social Network: Not on file    Current Medications: Current Facility-Administered Medications  Medication Dose Route Frequency Provider Last Rate Last Admin   acetaminophen  (TYLENOL ) tablet 650 mg  650 mg Oral Q6H PRN McLauchlin, Jon, NP   650 mg at 07/06/24 0722   alum & mag hydroxide-simeth (MAALOX/MYLANTA) 200-200-20 MG/5ML suspension 30 mL  30 mL Oral Q4H PRN McLauchlin, Jon,  NP       hydrOXYzine  (ATARAX ) tablet 25 mg  25 mg Oral TID PRN McLauchlin, Jon, NP   25 mg at 07/06/24 9270   lamoTRIgine  (LAMICTAL ) tablet 200 mg  200 mg Oral Daily Towana Leita SAILOR, MD   200 mg at 07/06/24 9190   magnesium  hydroxide (MILK OF MAGNESIA) suspension 30 mL  30 mL Oral Daily PRN McLauchlin, Jon, NP       memantine  (NAMENDA ) tablet 10 mg  10 mg Oral BID Towana Leita SAILOR, MD   10 mg at 07/06/24 0809   metFORMIN  (GLUCOPHAGE ) tablet 500 mg  500 mg Oral Q breakfast Towana Leita SAILOR, MD   500 mg at 07/06/24 0809   propranolol  ER (INDERAL  LA) 24 hr capsule 60 mg  60 mg Oral Daily Towana Leita SAILOR, MD   60 mg at 07/06/24 0809   QUEtiapine  (SEROQUEL  XR) 24 hr tablet 250 mg  250 mg Oral QHS Towana Leita SAILOR, MD   250 mg at 07/05/24 2023   QUEtiapine  (SEROQUEL ) tablet 25 mg  25 mg Oral BID WC Towana Leita SAILOR, MD   25 mg at 07/06/24 1143   traZODone  (DESYREL ) tablet 100 mg  100 mg Oral QHS Towana Leita SAILOR, MD    100 mg at 07/05/24 2023    Lab Results:  Results for orders placed or performed during the hospital encounter of 07/04/24 (from the past 48 hours)  CBC with Differential/Platelet     Status: Abnormal   Collection Time: 07/04/24  3:58 PM  Result Value Ref Range   WBC 13.9 (H) 4.0 - 10.5 K/uL   RBC 5.00 3.87 - 5.11 MIL/uL   Hemoglobin 14.3 12.0 - 15.0 g/dL   HCT 56.7 63.9 - 53.9 %   MCV 86.4 80.0 - 100.0 fL   MCH 28.6 26.0 - 34.0 pg   MCHC 33.1 30.0 - 36.0 g/dL   RDW 85.5 88.4 - 84.4 %   Platelets 338 150 - 400 K/uL   nRBC 0.0 0.0 - 0.2 %   Neutrophils Relative % 69 %   Neutro Abs 9.6 (H) 1.7 - 7.7 K/uL   Lymphocytes Relative 25 %   Lymphs Abs 3.5 0.7 - 4.0 K/uL   Monocytes Relative 5 %   Monocytes Absolute 0.6 0.1 - 1.0 K/uL   Eosinophils Relative 0 %   Eosinophils Absolute 0.0 0.0 - 0.5 K/uL   Basophils Relative 0 %   Basophils Absolute 0.0 0.0 - 0.1 K/uL   Immature Granulocytes 1 %   Abs Immature Granulocytes 0.07 0.00 - 0.07 K/uL    Comment: Performed at [facility], [address] [place], [place] [address]  Comprehensive metabolic panel     Status: None   Collection Time: 07/04/24  3:58 PM  Result Value Ref Range   Sodium 139 135 - 145 mmol/L   Potassium 4.1 3.5 - 5.1 mmol/L   Chloride 101 98 - 111 mmol/L   CO2 25 22 - 32 mmol/L   Glucose, Bld 94 70 - 99 mg/dL    Comment: Glucose reference range applies only to samples taken after fasting for at least 8 hours.   BUN 9 6 - 20 mg/dL   Creatinine, Ser 9.03 0.44 - 1.00 mg/dL   Calcium 89.6 8.9 - 89.6 mg/dL   Total Protein 7.7 6.5 - 8.1 g/dL   Albumin 4.8 3.5 - 5.0 g/dL   AST 26 15 - 41 U/L   ALT 35 0 - 44 U/L  Alkaline Phosphatase 66 38 - 126 U/L   Total Bilirubin 0.6 0.0 - 1.2 mg/dL   GFR, Estimated >39 >39 mL/min    Comment: (NOTE) Calculated using the CKD-EPI Creatinine Equation (2021)    Anion gap 13 5 - 15    Comment: Performed at [facility], [address], [place], [place] [address]   Hemoglobin A1c     Status: None   Collection Time: 07/04/24  3:58 PM  Result Value Ref Range   Hgb A1c MFr Bld 5.4 4.8 - 5.6 %    Comment: (NOTE) Diagnosis of Diabetes The following HbA1c ranges recommended by the American Diabetes Association (ADA) may be used as an aid in the diagnosis of diabetes mellitus.  Hemoglobin             Suggested A1C NGSP%              Diagnosis  <5.7                   Non Diabetic  5.7-6.4                Pre-Diabetic  >6.4                   Diabetic  <7.0                   Glycemic control for                       adults with diabetes.     Mean Plasma Glucose 108.28 mg/dL    Comment: Performed at [facility], [address], [place], [place] [address]  Magnesium      Status: None   Collection Time: 07/04/24  3:58 PM  Result Value Ref Range   Magnesium  1.9 1.7 - 2.4 mg/dL    Comment: Performed at [facility], [address] [place], [place] [address]  Ethanol     Status: None   Collection Time: 07/04/24  3:58 PM  Result Value Ref Range   Alcohol, Ethyl (B) <15 <15 mg/dL    Comment: (NOTE) For medical purposes only. Performed at [facility], [address] [place], [place] [address]   Lipid panel     Status: Abnormal   Collection Time: 07/04/24  3:58 PM  Result Value Ref Range   Cholesterol 223 (H) 0 - 200 mg/dL   Triglycerides 899 <849 mg/dL   HDL 53 >59 mg/dL   Total CHOL/HDL Ratio 4.2 RATIO   VLDL 20 0 - 40 mg/dL   LDL Cholesterol 849 (H) 0 - 99 mg/dL    Comment:        Total Cholesterol/HDL:CHD Risk Coronary Heart Disease Risk Table                     Men   Women  1/2 Average Risk   3.4   3.3  Average Risk       5.0   4.4  2 X Average Risk   9.6   7.1  3 X Average Risk  23.4   11.0        Use the calculated Patient Ratio above and the CHD Risk Table to determine the patient's CHD Risk.        ATP III CLASSIFICATION (LDL):  <100     mg/dL   Optimal  899-870  mg/dL   Near or Above  Optimal  130-159  mg/dL   Borderline  839-810  mg/dL   High  >809     mg/dL   Very High Performed at [facility], [address], [place], [place] [address]   TSH     Status: None   Collection Time: 07/04/24  3:58 PM  Result Value Ref Range   TSH 3.109 0.350 - 4.500 uIU/mL    Comment: Performed by a 3rd Generation assay with a functional sensitivity of <=0.01 uIU/mL. Performed at [facility], [address] [place], [place] [address]   Urinalysis, Routine w reflex microscopic -Urine, Clean Catch     Status: Abnormal   Collection Time: 07/04/24  3:58 PM  Result Value Ref Range   Color, Urine YELLOW YELLOW   APPearance CLOUDY (A) CLEAR   Specific Gravity, Urine >1.030 (H) 1.005 - 1.030   pH 6.0 5.0 - 8.0   Glucose, UA NEGATIVE NEGATIVE mg/dL   Hgb urine dipstick SMALL (A) NEGATIVE   Bilirubin Urine NEGATIVE NEGATIVE   Ketones, ur NEGATIVE NEGATIVE mg/dL   Protein, ur NEGATIVE NEGATIVE mg/dL   Nitrite NEGATIVE NEGATIVE   Leukocytes,Ua MODERATE (A) NEGATIVE    Comment: Performed at [facility], [address] [place], [place] [address]  Urinalysis, Microscopic (reflex)     Status: Abnormal   Collection Time: 07/04/24  3:58 PM  Result Value Ref Range   RBC / HPF 0-5 0 - 5 RBC/hpf   WBC, UA 6-10 0 - 5 WBC/hpf   Bacteria, UA MANY (A) NONE SEEN   Squamous Epithelial / HPF 6-10 0 - 5 /HPF    Comment: Performed at [facility], [address] [place], [place] [address]  Pregnancy, urine POC     Status: None   Collection Time: 07/04/24  4:09 PM  Result Value Ref Range   Preg Test, Ur NEGATIVE NEGATIVE    Comment:        THE SENSITIVITY OF THIS METHODOLOGY IS >20 mIU/mL.   POCT Urine Drug Screen - (I-Screen)     Status: Normal   Collection Time: 07/04/24  4:17 PM  Result Value Ref Range   POC Amphetamine UR None Detected NONE DETECTED (Cut Off Level 1000 ng/mL)   POC Secobarbital (BAR) None Detected NONE DETECTED (Cut Off Level 300 ng/mL)   POC  Buprenorphine (BUP) None Detected NONE DETECTED (Cut Off Level 10 ng/mL)   POC Oxazepam (BZO) None Detected NONE DETECTED (Cut Off Level 300 ng/mL)   POC Cocaine UR None Detected NONE DETECTED (Cut Off Level 300 ng/mL)   POC Methamphetamine UR None Detected NONE DETECTED (Cut Off Level 1000 ng/mL)   POC Morphine None Detected NONE DETECTED (Cut Off Level 300 ng/mL)   POC Methadone UR None Detected NONE DETECTED (Cut Off Level 300 ng/mL)   POC Oxycodone UR None Detected NONE DETECTED (Cut Off Level 100 ng/mL)   POC Marijuana UR None Detected NONE DETECTED (Cut Off Level 50 ng/mL)  POC urine preg, ED     Status: Normal   Collection Time: 07/04/24  4:19 PM  Result Value Ref Range   Preg Test, Ur Negative Negative    Blood Alcohol level:  Lab Results  Component Value Date   [facility] <15 07/04/2024   ETH <10 08/10/2022    Metabolic Disorder Labs: Lab Results  Component Value Date   HGBA1C 5.4 07/04/2024   MPG 108.28 07/04/2024   MPG 119.76 12/07/2023   No results found  for: PROLACTIN Lab Results  Component Value Date   CHOL 223 (H) 07/04/2024   TRIG 100 07/04/2024   HDL 53 07/04/2024   CHOLHDL 4.2 07/04/2024   VLDL 20 07/04/2024   LDLCALC 150 (H) 07/04/2024   LDLCALC 92 12/07/2023    Physical Findings:  Mental Status exam: Appearance: white female of slightly elevated BMI, appropriately groomed in casual clothing, seen sitting in her room  Eye contact: good  Attitude towards examiner: cooperative, needy  Psychomotor: no agitation or retardation  Speech: normal in rate and prosody; meandering and increased amount. Not pressured  Language: no delays  Mood: not good  Affect: congruent, restricted and anxious  Thought content: denying SI and HI (reporting intrusive thoughts but no desire or intent to act on thoughts), no delusions expressed  Thought Process: linear, organized and goal-directed  Perception: denying AVH, not RTIS  Insight: fair  Judgement: fair     Orientation: x3 Attention/Concentration: good - attends to interview  Memory/Cognition: grossly intact   Fund of Knowledge: Average      Musculoskeletal: Strength & Muscle Tone: within normal limits Gait & Station: normal Patient leans: N/A    Physical Exam Constitutional:      Appearance: Normal appearance.  HENT:     Head: Normocephalic and atraumatic.  Pulmonary:     Effort: Pulmonary effort is normal.  Abdominal:     General: There is no distension.  Musculoskeletal:        General: Normal range of motion.     Cervical back: Normal range of motion.  Neurological:     General: No focal deficit present.     Mental Status: She is alert.    Review of Systems  All other systems reviewed and are negative.  Blood pressure 126/86, pulse 86, temperature 98.1 F (36.7 C), temperature source Oral, resp. rate 17, height 5' 6 (1.676 m), weight 106.7 kg, SpO2 97%. Body mass index is 37.96 kg/m.   Treatment Plan Summary: Daily contact with patient to assess and evaluate symptoms and progress in treatment  Assessment: The patient is a 38 y.o. female with a psychiatric history most consistent with bipolar 1 disorder, current episode mixed and cluster B traits. She had carried a bipolar II diagnosis through much of adult life with worsening of symptoms occurring post-partum at age 39. Since then she has had 4 psychiatric admissions, 2 for credible manic episodes with psychosis. She has additionally presented with recurrent depressive episodes, although no suicide attempts. Mood symptoms are confounded by cluster B traits with clear history of unstable relationships, affective lability, poor coping, recurrent SI. On this admission the patient presents largely with concerns related to high anxiety and intrusive thoughts. Although reporting concerns for mania, aside from mildly reduced sleep (5 hrs) and rapidly cycling thoughts she does not present with endorsed active manic symptoms and  does not present with observable signs of mania.  Given this admission was precipitated by several recent psychosocial stressors, personality pathology may be contributing to worsening of symptoms  to some degree. Regardless, she has endorsed significant distress with current intrusive thoughts of harm to self/others, overall high anxiety and urges to count to reduce this more consistent with an OCD picture.     To address high anxiety/distress related to voices Seroquel  IR 12.5 BID was initiated yesterday and will increase to 25 mg as of lunch dose today given limited (if any) improvements. Evening seroquel  was increased to 250 mg to address sleep concerns and any manic/hypomanic component,  although OCD appears more likely at this time. If no improvements in the next 1-2 days, may carefully begin clomipramine  to address OCD and watch for worsening of symptoms. Discussed with patient plan of being in the milieu and around others more often, utilizing coping skills to push through intrusive thoughts, and to avoid going into the isolation room. Patient has requested 1:1 and been advised against this.   DSM-5 diagnoses: Bipolar I disorder, current episode mixed, no current psychotic features  Obsessive-Compulsive disorder  Cluster B traits              -r/o BPD   Plan:   Legal Status: -voluntary    Safety -q15 minute checks  -elopement, suicide and assault precautions  -daily vitals   Psychiatric Concerns  -Continue Lamotrigine  200 mg daily for mood  -Continue Namenda  10 mg BID for reported intrusive thoughts (patient takes at home and reports benefit) -Continue Trazodone  100 mg at bedtime for sleep -Increase Seroquel  XR to 250 mg at bedtime for sleep and mood stabilization -Begin Seroquel  IR 12.5 mg with breakfast and lunch to address high anxiety/intrusive thoughts    Substance use concerns  -none currently    Nicotine  Replacement  N/A    Medical concerns Pre-diabetes -Current A1c  WNL (5.4) but was elevated 7 months ago prior to beginning metformin  -Restart metformin  500 mg daily      Additional PRNs: -Tylenol  tablets 650 mg every 6 hours as needed for pain -Maalox/Mylanta suspension 30 mL every 4 hours as needed for indigestion  -Milk of Magnesia 30 mL daily as needed for constipation   Labs -Reviewed as documented in HPI   Psychosocial interventions  -daily medication management with psychiatry -Medication education regarding risks/benefits and alternatives -bedside psychotherapy as indicated  -Patient will be encouraged to participate and engage with group therapy  -Appreciate SW assistance in coordinating safe disposition    Leita LOISE Arts, MD 07/06/2024, 12:33 PM

## 2024-07-06 NOTE — Group Note (Signed)
 Date:  07/06/2024 Time:  8:49 PM  Group Topic/Focus:  Wrap-Up Group:   The focus of this group is to help patients review their daily goal of treatment and discuss progress on daily workbooks.    Participation Level:  Did Not Attend  Kara Stephens 07/06/2024, 8:49 PM

## 2024-07-06 NOTE — Group Note (Signed)
 Recreation Therapy Group Note   Group Topic:Personal Development  Group Date: 07/06/2024 Start Time: 1005 End Time: 1030 Facilitators: Hector Taft-McCall, LRT,CTRS Location: 500 Hall Dayroom   Group Topic: Triggers  Goal Area(s) Addresses:  Patient will identify their biggest triggers.   Patient will identify how they avoid their triggers.  Patient will identify how they deal with triggers head on.   Behavioral Response:   Intervention: Worksheets  Activity: LRT attempted to discuss triggers with patients. Patients identified the things that trigger them. Patients would then identify how they would limit their exposure to their triggers and then identify how they would deal with them head on.   Education: Triggers, Discharge Planning   Education Outcome: Acknowledges education/In group clarification offered/Needs additional education.    Affect/Mood: N/A   Participation Level: Did not attend    Clinical Observations/Individualized Feedback:     Plan: Continue to engage patient in RT group sessions 2-3x/week.   Anayah Arvanitis-McCall, LRT,CTRS 07/06/2024 11:36 AM

## 2024-07-06 NOTE — Progress Notes (Signed)
 Spiritual care group facilitated by Chaplain Rockie Sofia, [facility]  Group focused on topic of strength. Group members reflected on what thoughts and feelings emerge when they hear this topic. They then engaged in facilitated dialog around how strength is present in their lives. This dialog focused on representing what strength had been to them in their lives (images and patterns given) and what they saw as helpful in their life now (what they needed / wanted).  Activity drew on narrative framework.  Patient Progress: Merla attended group and actively engaged and participated in group conversation and activities. Comments demonstrated good insight and contributed positively to the group conversation.

## 2024-07-06 NOTE — Progress Notes (Signed)
(  Sleep Hours) -7.5 (Any PRNs that were needed, meds refused, or side effects to meds)-  (Any disturbances and when (visitation, over night)- (Concerns raised by the patient)- P)t stated she is concerned her SI thoughts will increase more. Pt stated she had an thought to lie to the provider that she is no longer having self harm thoughts. Pt acknowledge this would not be beneficial for her. Writer educated pt the importance of transparency while admitted inpatient.  (SI/HI/AVH)-endorses SI

## 2024-07-06 NOTE — Progress Notes (Addendum)
 Pt shares I can't really feel my emotions, I was listening to music last night and its not the same I dont feel joy. Pt also shares I was angry earlier I heard someone has a 1:1 and I feel like I need one, I dont trust myself to be in the room alone. Pt also shared she felt anxious during breakfast, I knew how overstimulating it can be and I forgot, I went and felt anxious. Pt states she also has racing thoughts, sexual thoughts, thoughts to hurt my son and passive SI and AH of someone saying her sons name. Pt shared she has thoughts of buying a gun when she goes home. Pt reports a good appetite, and reported back pain and rated pain 2/10, pt requested PRN and was provided. Pt verbally contracts for safety. Provided support and encouragement. Pt safe on the unit. Q 15 minute safety checks continued.

## 2024-07-07 DIAGNOSIS — F429 Obsessive-compulsive disorder, unspecified: Secondary | ICD-10-CM | POA: Diagnosis not present

## 2024-07-07 DIAGNOSIS — F3162 Bipolar disorder, current episode mixed, moderate: Secondary | ICD-10-CM | POA: Diagnosis not present

## 2024-07-07 MED ORDER — CLONAZEPAM 0.5 MG PO TABS
0.5000 mg | ORAL_TABLET | Freq: Every day | ORAL | Status: DC | PRN
  Administered 2024-07-07: 0.5 mg via ORAL
  Filled 2024-07-07: qty 1

## 2024-07-07 MED ORDER — LAMOTRIGINE 25 MG PO TABS
250.0000 mg | ORAL_TABLET | Freq: Every day | ORAL | Status: DC
  Administered 2024-07-08 – 2024-07-26 (×19): 250 mg via ORAL
  Filled 2024-07-07: qty 2
  Filled 2024-07-07: qty 1
  Filled 2024-07-07 (×7): qty 2
  Filled 2024-07-07: qty 1
  Filled 2024-07-07 (×10): qty 2

## 2024-07-07 MED ORDER — TRAZODONE HCL 50 MG PO TABS
50.0000 mg | ORAL_TABLET | Freq: Every day | ORAL | Status: DC
  Administered 2024-07-07 – 2024-07-25 (×19): 50 mg via ORAL
  Filled 2024-07-07 (×21): qty 1

## 2024-07-07 NOTE — Progress Notes (Signed)
 Collateral contact - [facility].  Phone:  [phone].  Fax: [phone]  CSW was informed that the waiting list is 1 - 2 months or less.    CSW faxed a referral.  CSW signed the ROI.   Laneya Gasaway, LCSWA 07/07/2024

## 2024-07-07 NOTE — Plan of Care (Signed)

## 2024-07-07 NOTE — Progress Notes (Addendum)
 Collateral contact  [facility] [employer] (Team Lead) [phone]    Katie (Team Lead) said that they are not accepting new applications at this time.  She said if the application were submitted, it would be denied.   Violetta Lavalle, LCSWA 07/07/2024

## 2024-07-07 NOTE — Progress Notes (Signed)
 Pt shares she has thoughts to hurt herself and her family. Pt shares with nurse that she wants to speak with her provider again due to needing anxiety meds since she feels hydroxyzine  is not as effective. Pt denies AVH and verbally contracts for safety. Provided support and encouragement. Pt safe on the unit. Q 15 minute safety checks continued.

## 2024-07-07 NOTE — B[facility] Group Notes (Signed)
 Adult Psychoeducational Group Note  Date:  07/07/2024 Time:  3:22 PM  Group Topic/Focus: Physical Wellness   Participation Level:  Did Not Attend  Participation Quality:    Affect:    Cognitive:    Insight:   Engagement in Group:    Modes of Intervention:    Additional Comments:    [name] 07/07/2024, 3:22 PM

## 2024-07-07 NOTE — [facility] Group Notes (Signed)
 Spirituality Group   Description: Participant directed exploration of values, beliefs and meaning   **Focus on Gratitude: Invite reflection on sources of gratitude (external/internal); goal to invite internal gratitude to foster 1) reconnection with life-giving activities 2) self-compassion.   Following a brief framework of chaplain's role and ground rules of group behavior, participants are invited to share concerns or questions that engage spiritual life. Emphasis placed on common themes and shared experiences and ways to make meaning and clarify living into one's values.   Theory/Process/Goal: Utilize the theoretical framework of group therapy established by Celena Kite, Relational Cultural Theory and Rogerian approaches to facilitate relational empathy and use of the "here and now" to foster reflection, self-awareness, and sharing.   Observations: Kara Stephens was present for the second half of group. She was reserved but an active participant, naming her love for family as a strength. Met one on one with chaplain after group (see note).  Kara Stephens HERO.Div

## 2024-07-07 NOTE — Progress Notes (Signed)
 [facility] MD Progress Note  07/07/2024 8:24 AM Kara Stephens  MRN:  [id number]  Principal Problem: Bipolar 1 disorder, mixed, moderate (HCC) Diagnosis: Principal Problem:   Bipolar 1 disorder, mixed, moderate (HCC) Active Problems:   Obsessive compulsive disorder  Total Time spent with patient: 35 minutes   Identifying Information and Past Psychiatric History:  The patient is a 38 y.o. female (domiciled with husband and children,) with a medical history of pre-diabetes and a psychiatric history most consistent with bipolar 1 disorder, OCD and cluster B traits, currently admitted due to high anxiety related to intrusive thoughts of suicide (without actual SI).   Psychiatric history notable for prior bipolar II diagnosis with approximately 5 years of stability on lamotrigine . Symptoms reoccurred with worsening severity after the birth of her second child at age 80. Since then she has had at least 2 credible manic symptoms with psychosis. Symptoms have included racing thoughts, reduced sleep, impulsivity, pressured speech and psychomotor agitation and have included psychotic symptoms such as hallucinations. In addition to elevated mood states, the patient has had recurrent depressive symptoms characterized by low mood, anhedonia, irritability, low energy and negative intrusive thoughts. She has had 4 hospitalizations in the last 2-3 years for depression and mania: March 2025 for mania, February 2025 for mania, December 2023 bipolar mixed episode with catatonic features and OCD. She was most recently admitted from 12/09/23-12/14/23 at [facility] self-admitted largely for anxiety symptoms, feeling that her mania was not well controlled. She has no prior suicide attempts. She has been on numerous medications for management of mood symptoms as noted below with numerous side effects/intolerances or poor effects. She does present with instability in relationships, poor coping, recurrent SI, affective lability concerning  for cluster B traits confounding mood symptoms. Patient has no prior suicide attempts. Currently follows outpatient with Dr. Chyrl Na for med management and she sees Velinda Beat for therapy.   Interval events: The patient was documented to punch window yesterday due to frustration that staff are not taking her mania seriously. Received PO PRN haldol . Patient documented by staff to be fixated on signs/symptoms of mania, arguing about amount of sleep she is getting. Staff documenting 7 hrs; patient reporting 2. Intermittently endorsing SI.   Interview today: Today the patient reports that she is okay. States that her intrusive thought are no better. Also feels that she is not being heard when telling everyone that she has mania. States the staff think she is sleeping but she is not - feels she only got about 2 hrs. This dino reminded the patient that we are treating her as if this is a manic episode with medications targeting this and she voiced understanding. Discussed with her in detail that quetiapine  would not worsen mania and that, if anything, trazodone  could potentially be the cause. She was agreeable with a decrease to 50 mg tonight. Patient also wanted to know if it was possible to increase the lamotrigine , as she has found this effective. Overall interview was mainly focused on patient's subjective feelings that she is experiencing mania and voiced discontent with the unit. Although she continued to voice experiencing distressing intrusive thoughts she did not go into detail on these today. No SI, HI or AVH reported.   Past Medical History:  Past Medical History:  Diagnosis Date   Anxiety    Back spasm    Bipolar I disorder (HCC)    Borderline personality disorder (HCC)    Class 3 obesity (HCC)    Cluster B  personality disorder (HCC)    Hypercholesterolemia    Hypertension    Hypertriglyceridemia    Somatic symptom disorder    Tachycardia    Type 2 diabetes mellitus (HCC)     Vitamin D  insufficiency     Past Surgical History:  Procedure Laterality Date   APPENDECTOMY     CHOLECYSTECTOMY     HAND SURGERY     SHOULDER SURGERY     Family History:  Family History  Problem Relation Age of Onset   Hypertension Mother    Hyperlipidemia Mother    Suicidality Father    Depression Father    Social History:  Social History   Substance and Sexual Activity  Alcohol Use No     Social History   Substance and Sexual Activity  Drug Use No    Social History   Socioeconomic History   Marital status: Married    Spouse name: Not on file   Number of children: Not on file   Years of education: Not on file   Highest education level: Not on file  Occupational History   Not on file  Tobacco Use   Smoking status: Every Day    Current packs/day: 0.50    Average packs/day: 0.5 packs/day for 5.7 years (2.8 ttl pk-yrs)    Types: Cigarettes    Start date: 11/2018    Last attempt to quit: 09/10/2016   Smokeless tobacco: Never  Vaping Use   Vaping status: Never Used  Substance and Sexual Activity   Alcohol use: No   Drug use: No   Sexual activity: Not on file  Other Topics Concern   Not on file  Social History Narrative   Not on file   Social Drivers of Health   Financial Resource Strain: Not on file  Food Insecurity: Food Insecurity Present (07/04/2024)   Hunger Vital Sign    Worried About Running Out of Food in the Last Year: Sometimes true    Ran Out of Food in the Last Year: Never true  Transportation Needs: No Transportation Needs (07/04/2024)   PRAPARE - Administrator, Civil Service (Medical): No    Lack of Transportation (Non-Medical): No  Physical Activity: Not on file  Stress: Stress Concern Present (11/16/2023)   Received from [facility] - Occupational Stress Questionnaire    Feeling of Stress : Very much  Social Connections: Unknown (01/10/2022)   Received from [facility]   Social  Network    Social Network: Not on file    Current Medications: Current Facility-Administered Medications  Medication Dose Route Frequency Provider Last Rate Last Admin   acetaminophen  (TYLENOL ) tablet 650 mg  650 mg Oral Q6H PRN McLauchlin, Angela, NP   650 mg at 07/06/24 2020   alum & mag hydroxide-simeth (MAALOX/MYLANTA) 200-200-20 MG/5ML suspension 30 mL  30 mL Oral Q4H PRN McLauchlin, Angela, NP       haloperidol  (HALDOL ) tablet 5 mg  5 mg Oral Q6H PRN Towana Leita SAILOR, MD       Or   haloperidol  lactate (HALDOL ) injection 5 mg  5 mg Intramuscular Q6H PRN Keyna Blizard N, MD   5 mg at 07/06/24 1508   hydrOXYzine  (ATARAX ) tablet 25 mg  25 mg Oral TID PRN McLauchlin, Angela, NP   25 mg at 07/07/24 0036   lamoTRIgine  (LAMICTAL ) tablet 200 mg  200 mg Oral Daily Towana Leita SAILOR, MD   200 mg at 07/07/24 [phone]  magnesium  hydroxide (MILK OF MAGNESIA) suspension 30 mL  30 mL Oral Daily PRN McLauchlin, Angela, NP       melatonin tablet 3 mg  3 mg Oral QHS Sunni Richardson N, MD   3 mg at 07/06/24 2020   memantine  (NAMENDA ) tablet 10 mg  10 mg Oral BID Chaquita Basques N, MD   10 mg at 07/07/24 0813   metFORMIN  (GLUCOPHAGE ) tablet 500 mg  500 mg Oral Q breakfast Towana Leita SAILOR, MD   500 mg at 07/07/24 9185   propranolol  ER (INDERAL  LA) 24 hr capsule 60 mg  60 mg Oral Daily Kadeja Granada N, MD   60 mg at 07/07/24 0813   QUEtiapine  (SEROQUEL  XR) 24 hr tablet 250 mg  250 mg Oral QHS Sandee Bernath N, MD   250 mg at 07/06/24 2020   QUEtiapine  (SEROQUEL ) tablet 25 mg  25 mg Oral BID WC Towana Leita SAILOR, MD   25 mg at 07/07/24 9185   traZODone  (DESYREL ) tablet 100 mg  100 mg Oral QHS Jarred Purtee N, MD   100 mg at 07/06/24 2019    Lab Results:  No results found for this or any previous visit (from the past 48 hours).   Blood Alcohol level:  Lab Results  Component Value Date   [facility] <15 07/04/2024   ETH <10 08/10/2022    Metabolic Disorder Labs: Lab Results  Component Value Date   HGBA1C 5.4 07/04/2024    MPG 108.28 07/04/2024   MPG 119.76 12/07/2023   No results found for: PROLACTIN Lab Results  Component Value Date   CHOL 223 (H) 07/04/2024   TRIG 100 07/04/2024   HDL 53 07/04/2024   CHOLHDL 4.2 07/04/2024   VLDL 20 07/04/2024   LDLCALC 150 (H) 07/04/2024   LDLCALC 92 12/07/2023    Physical Findings:  Mental Status exam: Appearance: white female of slightly elevated BMI, appropriately groomed in casual clothing, seen sitting in her room  Eye contact: good  Attitude towards examiner: somewhat cooperative, needy and difficult to redirect off certain topics  Psychomotor: no agitation or retardation  Speech: normal in prosody; slow and meandering; not pressured  Language: no delays  Mood: not good  Affect: congruent, restricted and anxious  Thought content: denying SI and HI (reporting intrusive thoughts but no desire or intent to act on thoughts), no delusions expressed  Thought Process: linear, organized and goal-directed  Perception: denying AVH, not RTIS  Insight: fair  Judgement: fair    Orientation: x3 Attention/Concentration: good - attends to interview  Memory/Cognition: grossly intact   Fund of Knowledge: Average      Musculoskeletal: Strength & Muscle Tone: within normal limits Gait & Station: normal Patient leans: N/A    Physical Exam Constitutional:      Appearance: Normal appearance.  HENT:     Head: Normocephalic and atraumatic.  Pulmonary:     Effort: Pulmonary effort is normal.  Abdominal:     General: There is no distension.  Musculoskeletal:        General: Normal range of motion.     Cervical back: Normal range of motion.  Neurological:     General: No focal deficit present.     Mental Status: She is alert.    Review of Systems  All other systems reviewed and are negative.  Blood pressure 134/83, pulse 85, temperature 98.7 F (37.1 C), temperature source Oral, resp. rate 17, height 5' 6 (1.676 m), weight 106.7 kg, SpO2 97%. Body  mass  index is 37.96 kg/m.   Treatment Plan Summary: Daily contact with patient to assess and evaluate symptoms and progress in treatment  Assessment: The patient is a 38 y.o. female with a psychiatric history most consistent with bipolar 1 disorder, current episode mixed and cluster B traits. She had carried a bipolar II diagnosis through much of adult life with worsening of symptoms occurring post-partum at age 67. Since then she has had 4 psychiatric admissions, 2 for credible manic episodes with psychosis. She has additionally presented with recurrent depressive episodes, although no suicide attempts. Mood symptoms are confounded by cluster B traits with clear history of unstable relationships, affective lability, poor coping, recurrent SI. On this admission the patient presents largely with concerns related to high anxiety and intrusive thoughts. Although reporting concerns for mania, aside from mildly reduced sleep (5 hrs) and rapidly cycling thoughts she does not present with endorsed active manic symptoms and does not present with observable signs of mania.  Given this admission was precipitated by several recent psychosocial stressors, personality pathology may be contributing to worsening of symptoms  to some degree. Regardless, she has endorsed significant distress with current intrusive thoughts of harm to self/others, overall high anxiety and urges to count to reduce this more consistent with an OCD picture.     To address high anxiety/distress related to voices Seroquel  IR 12.5 BID was initiated 10/29 and will increased to 25 mg as of lunch dose 10/30. Evening seroquel  was increased to 250 mg to address sleep concerns and any manic/hypomanic component, although OCD appears more likely at this time. Patient has been fixated on staff admitting that she is having a manic episode, punched a wall yesterday because she felt she was not being heard. This dino reminded patient that all medications  at this time are to address symptoms of bipolar disorder - specifically mania - and that although OCD is likely we have not yet started a medicine targeting this specifically. She voiced understanding, however was perseverative on this topic and voiced numerous frustrations. Presentation appears more in line with cluster B traits and poor frustration tolerance. Regardless, given high anxiety and subjective feeling of poor sleep and rapidly cycling thoughts will move to increase lamotrigine  to 250 mg tomorrow. Will decrease trazodone  to 50 mg tonight in case of paradoxical worsening of symptoms. Klonopin  0.5 mg once daily as needed to be reserved for severe anxiety/distress.   If no improvements in the next 1-2 days, may carefully begin clomipramine  to address OCD and watch for worsening of symptoms. Continue to recommend to patient being in the milieu and around others more often, utilizing coping skills to push through intrusive thoughts, and to avoid going into the isolation room. No 1:1 sitter.   DSM-5 diagnoses: Bipolar I disorder, current episode mixed, no current psychotic features  Obsessive-Compulsive disorder  Cluster B traits              -r/o BPD   Plan:   Legal Status: -voluntary    Safety -q15 minute checks  -elopement, suicide and assault precautions  -daily vitals   Psychiatric Concerns  -Continue Lamotrigine  200 mg daily for mood   -increase to 250 mg tomorrow  -Continue Namenda  10 mg BID for reported intrusive thoughts (patient takes at home and reports benefit) -Decrease Trazodone  to 50 mg at bedtime for sleep in case of paradoxical worsening of sleep in the setting of bipolar disorder -Increase Seroquel  XR to 250 mg at bedtime for sleep and mood stabilization -Continue  Seroquel  IR 25 mg with breakfast and lunch to address high anxiety/intrusive thoughts    Substance use concerns  -none currently    Nicotine  Replacement  N/A    Medical  concerns Pre-diabetes -Current A1c WNL (5.4) but was elevated 7 months ago prior to beginning metformin  -Continue metformin  500 mg daily      Additional PRNs: -Tylenol  tablets 650 mg every 6 hours as needed for pain -Maalox/Mylanta suspension 30 mL every 4 hours as needed for indigestion  -Milk of Magnesia 30 mL daily as needed for constipation   Labs -Reviewed as documented in HPI   Psychosocial interventions  -daily medication management with psychiatry -Medication education regarding risks/benefits and alternatives -bedside psychotherapy as indicated  -Patient will be encouraged to participate and engage with group therapy  -Appreciate SW assistance in coordinating safe disposition    Leita LOISE Arts, MD 07/07/2024, 8:24 AM

## 2024-07-07 NOTE — BHH Group Notes (Signed)
 Adult Psychoeducational Group Note  Date:  07/07/2024 Time:  1:05 PM  Group Topic/Focus: Recreational Therapy   Participation Level:  Did Not Attend  Participation Quality:    Affect:    Cognitive:    Insight:   Engagement in Group:    Modes of Intervention:    Additional Comments:    [name] 07/07/2024, 1:05 PM

## 2024-07-07 NOTE — BHH Group Notes (Signed)
 Adult Psychoeducational Group Note  Date:  07/07/2024 Time:  1:54 PM  Group Topic/Focus: Pysical Wellness   Participation Level:  Did Not Attend  Participation Quality:    Affect:    Cognitive:    Insight:   Engagement in Group:    Modes of Intervention:    Additional Comments:    [name] 07/07/2024, 1:54 PM

## 2024-07-07 NOTE — BHH Group Notes (Signed)
 Adult Psychoeducational Group Note  Date:  07/07/2024 Time:  1:03 PM  Group Topic/Focus: Recreational Therapy   Participation Level:  Did Not Attend  Participation Quality:    Affect:    Cognitive:    Insight:   Engagement in Group:    Modes of Intervention:    Additional Comments:    [name] 07/07/2024, 1:03 PM

## 2024-07-07 NOTE — Progress Notes (Signed)
(  Sleep Hours) -7 (Any PRNs that were needed, meds refused, or side effects to meds)- hydroxyzine   (Any disturbances and when (visitation, over night)- (Concerns raised by the patient)- Pt is fixated on what signs and symptoms she has.When disusing her symptoms pt stated she was manic. Writer explained to pt that she should trust the psychiatrist being that have expertise on being able to diagnosis her. Writer explained to pt the s/s she displaying sounds more aligned with hypomania and should just wait to discuss her concerns with her provider. Upon this discuss she became fixated on the fact has hypomania. Pt keeps stating she has not slept when writer explained to pt that clinical research associate complete safety checks on 10/29 and writer could hear her snoring pt tried to dismiss charity fundraiser by saying I have not been sleeping.Pt stated she could not take haldol  because,those types of medications I have reaction to. Writer explain to pt that Seroquel  is in the same class as haldol  pt began to smile.  (SI/HI/AVH)-Endorses SI

## 2024-07-07 NOTE — Group Note (Signed)
 Recreation Therapy Group Note   Group Topic:Problem Solving  Group Date: 07/07/2024 Start Time: 1035 End Time: 1045 Facilitators: Shaniya Tashiro-McCall, LRT,CTRS Location: [address] Dayroom   Group Topic: Halloween Trivia  Goal Area(s) Addresses:  Patient will effectively work in a team with other group members. Patient will verbalize importance of using appropriate problem solving techniques.   Behavioral Response:   Intervention: Trivia and Art  Activity: Journalist, Newspaper. LRT would ask patients fun trivia questions about some of the basic elements of Halloween and what makes it fun. Patients were also given options between 2 pumpkin outline templates to which they could create and decorate how they chose to.  Education: Journalist, Newspaper, Team Building, Communication  Education Outcome: Acknowledges understanding/In group clarification offered/Needs additional education.    Affect/Mood: N/A   Participation Level: Did not attend    Clinical Observations/Individualized Feedback:     Plan: Continue to engage patient in RT group sessions 2-3x/week.   Kery Haltiwanger-McCall, LRT,CTRS  07/07/2024 1:06 PM

## 2024-07-07 NOTE — Plan of Care (Signed)
  Problem: Safety: Goal: Periods of time without injury will increase Outcome: Progressing   Problem: Activity: Goal: Interest or engagement in activities will improve Outcome: Not Progressing   Problem: Coping: Goal: Ability to verbalize frustrations and anger appropriately will improve Outcome: Not Progressing

## 2024-07-07 NOTE — B[facility] Group Notes (Signed)
 Adult Psychoeducational Group Note  Date:  07/07/2024 Time:  10:26 AM  Group Topic/Focus: Goals [place] Goals Group:   The focus of this group is to help patients establish daily goals to achieve during treatment and discuss how the patient can incorporate goal setting into their daily lives to aide in recovery.  Participation Level:  Active  Participation Quality:  Attentive  Affect:  Appropriate  Cognitive:  Appropriate  Insight: Good  Engagement in Group:  Engaged  Modes of Intervention:    Additional Comments:  Pt. Did participated as best of her ability.  Annett Redman Ramsay 07/07/2024, 10:26 AM

## 2024-07-07 NOTE — Plan of Care (Signed)
   Problem: Education: Goal: Emotional status will improve Outcome: Progressing Goal: Mental status will improve Outcome: Progressing Goal: Verbalization of understanding the information provided will improve Outcome: Progressing   Problem: Activity: Goal: Interest or engagement in activities will improve Outcome: Progressing

## 2024-07-07 NOTE — Progress Notes (Signed)
   07/07/24 1425  Spiritual Encounters  Type of Visit Initial  Care provided to: Patient  Referral source Patient request  Reason for visit Routine spiritual support  Spiritual Framework  Presenting Themes Meaning/purpose/sources of inspiration;Coping tools;Courage hope and growth;Rituals and practive  Interventions  Spiritual Care Interventions Made Established relationship of care and support;Compassionate presence;Reflective listening;Normalization of emotions;Explored values/beliefs/practices/strengths;Prayer;Encouragement   I met with Kriusten following spirituality group a her request.  Danaka processed with me her feelings and concerns around symptoms and goals for her healing. She shared her Chrisitan faith as a source of meaning and strength. She feels disconnected from her authentic self due to medication but also hopes for interventions that will combat intrusive thoughts that suggest harm to self and to her son. She described a good deal of self awareness, including appreciation of somatic awareness and symptoms recognition. She reports being very forthcoming with care team and hopeful but nervous about treatment; frustration with feeling of flat affect.  I provided compassionate, non-anxious presence and offered active and relfective listening. I invited Parish to recognize the many positive attributes and actions she shared thus far. I invited memory of past times symptoms had been improved. I engaged her faith to invite her meaning making and locate hope in her own narrative and in aspects of faith. I offered prayer at [facility] request and words of encouragement.  Tranika Scholler L. Delores HERO.Div

## 2024-07-08 DIAGNOSIS — F3162 Bipolar disorder, current episode mixed, moderate: Secondary | ICD-10-CM | POA: Diagnosis not present

## 2024-07-08 DIAGNOSIS — F429 Obsessive-compulsive disorder, unspecified: Secondary | ICD-10-CM | POA: Diagnosis not present

## 2024-07-08 MED ORDER — CLONAZEPAM 0.5 MG PO TABS
0.5000 mg | ORAL_TABLET | Freq: Two times a day (BID) | ORAL | Status: DC | PRN
  Administered 2024-07-08 – 2024-07-23 (×12): 0.5 mg via ORAL
  Filled 2024-07-08 (×12): qty 1

## 2024-07-08 MED ORDER — QUETIAPINE FUMARATE ER 300 MG PO TB24
300.0000 mg | ORAL_TABLET | Freq: Every day | ORAL | Status: DC
  Administered 2024-07-08 – 2024-07-25 (×18): 300 mg via ORAL
  Filled 2024-07-08: qty 2
  Filled 2024-07-08 (×2): qty 1
  Filled 2024-07-08: qty 2
  Filled 2024-07-08: qty 1
  Filled 2024-07-08: qty 2
  Filled 2024-07-08: qty 1
  Filled 2024-07-08: qty 2
  Filled 2024-07-08: qty 1
  Filled 2024-07-08 (×2): qty 2
  Filled 2024-07-08: qty 1
  Filled 2024-07-08 (×3): qty 2
  Filled 2024-07-08 (×2): qty 1
  Filled 2024-07-08: qty 2

## 2024-07-08 MED ORDER — CLOMIPRAMINE HCL 25 MG PO CAPS
25.0000 mg | ORAL_CAPSULE | Freq: Two times a day (BID) | ORAL | Status: DC
  Administered 2024-07-08 – 2024-07-10 (×4): 25 mg via ORAL
  Filled 2024-07-08 (×5): qty 1

## 2024-07-08 NOTE — Group Note (Deleted)
 Date:  07/08/2024 Time:  6:48 PM  Group Topic/Focus:  Emotional Wellness   The group session centered on anger management and coping mechanisms, beginning with a TED Talk that provided insights on shifting perspectives and effectively managing anger. After the video, the group engaged in a discussion about the anger cycle, exploring how anger develops and the emotional and physiological responses that accompany it. Patients also examined common triggers of anger and how to identify these triggers in their own lives. The group then focused on distinguishing between healthy and unhealthy coping mechanisms, with a particular emphasis on strategies that promote emotional regulation and positive outcomes.  Throughout the discussion, patients had the opportunity to collaborate by sharing personal experiences and brainstorming a variety of coping strategies. This collective sharing allowed participants to gain new insights and refine their own approaches to managing anger. The group dynamic fostered a supportive environment where patients could learn from each other's experiences, providing both validation and constructive feedback on coping techniques. The session aimed to enhance self-awareness, promote emotional resilience, and encourage the use of adaptive coping strategies in real-life situations.   Participation Level:  {BHH PARTICIPATION OZCZO:77735}  Participation Quality:  {BHH PARTICIPATION QUALITY:22265}  Affect:  {BHH AFFECT:22266}  Cognitive:  {BHH COGNITIVE:22267}  Insight: {BHH Insight2:20797}  Engagement in Group:  {BHH ENGAGEMENT IN HMNLE:77731}  Modes of Intervention:  {BHH MODES OF INTERVENTION:22269}  Additional Comments:  ***  Kara Stephens 07/08/2024, 6:48 PM

## 2024-07-08 NOTE — Progress Notes (Signed)
 [facility] MD Progress Note  07/08/2024 10:35 AM ALIZZON DIOGUARDI  MRN:  [id number]  Principal Problem: Bipolar 1 disorder, mixed, moderate (HCC) Diagnosis: Principal Problem:   Bipolar 1 disorder, mixed, moderate (HCC) Active Problems:   Obsessive compulsive disorder  Total Time spent with patient: 35 minutes   Identifying Information and Past Psychiatric History:  The patient is a 38 y.o. female (domiciled with husband and children,) with a medical history of pre-diabetes and a psychiatric history most consistent with bipolar 1 disorder, OCD and cluster B traits, currently admitted due to high anxiety related to intrusive thoughts of suicide (without actual SI).   Psychiatric history notable for prior bipolar II diagnosis with approximately 5 years of stability on lamotrigine . Symptoms reoccurred with worsening severity after the birth of her second child at age 16. Since then she has had at least 2 credible manic symptoms with psychosis. Symptoms have included racing thoughts, reduced sleep, impulsivity, pressured speech and psychomotor agitation and have included psychotic symptoms such as hallucinations. In addition to elevated mood states, the patient has had recurrent depressive symptoms characterized by low mood, anhedonia, irritability, low energy and negative intrusive thoughts. She has had 4 hospitalizations in the last 2-3 years for depression and mania: March 2025 for mania, February 2025 for mania, December 2023 bipolar mixed episode with catatonic features and OCD. She was most recently admitted from 12/09/23-12/14/23 at [facility] self-admitted largely for anxiety symptoms, feeling that her mania was not well controlled. She has no prior suicide attempts. She has been on numerous medications for management of mood symptoms as noted below with numerous side effects/intolerances or poor effects. She does present with instability in relationships, poor coping, recurrent SI, affective lability concerning  for cluster B traits confounding mood symptoms. Patient has no prior suicide attempts. Currently follows outpatient with Dr. Chyrl Na for med management and she sees Velinda Beat for therapy.   Interval events: The patient was documented to be attending some groups with good engagement, not attending others. Did speak 1 on 1 with chaplain for some time for spiritual support. ACT teams contacted; so far not accepting new clients. Reporting intrusive SI still to staff, denying HI or AVH. 7 hrs of sleep recorded overnight, patient reports 2-3. BP 124/87 (BP Location: Left Arm)   Pulse 87   Temp 98.5 F (36.9 C) (Oral)   Resp 17   Ht 5' 6 (1.676 m)   Wt 106.7 kg   SpO2 97%   BMI 37.96 kg/m     Interview today: Today the patient states she is okay. Continues to have distressing intrusive thoughts that have not significantly changed even with daytime administration of Seroquel . Feels the daytime Seroquel  makes her tired and feel numbed out but doesn't significantly reduce anxiety - still had 2 panic attacks yesterday. Similarly feels that she slept poorly again last night feels that she woke very early and only got about 2-3 hours total. She feels tired today, but also anxious/wired. She is hoping to stop the daytime seroquel  and find a medicine more for intrusive thoughts. Discussed in detail today again with patient that intrusive thoughts are anxiety based and likely more related to OCD than mania. Discussed risks of initiating low-dose SSRI or TCA potentially worsening mania and she was agreeable to low-dose trial. Continues to deny actual SI, HI or AVH, but has ongoing distressing intrusive thoughts of suicide and throwing her son into a pool. We discussed additional behavioral strategies to address these thoughts and reassured patient  that typically people do not act on these ego-dystonic intrusive thoughts. All questions answered.     Collateral: Husband, Samyra Limb [phone]   -Updated husband about the treatment plan. Husband says the only change was the metformin , which was increased and seemed to be around that time things got worse. Also notes that her mental health worsened at the same time as their daughter's. She was in another hospital and on many different antipsychotics, none of them seemed to work and in fact might have made it worse. She was doing well for quite a while on the Seroquel /lamotrigine  combination. Did note that again the teenage daughter is struggling with depression herself and feels that Kristin has been under more stress because of this and some other family situations. Every time she has gone into a manic state it has been after significant stress - was hospitalized for her first manic episode after her dad died by suicide. She then did well outpatient until the birth of her son, after which time she has been in the hospital several times. Daughter, lack of sleep and family dog recently drowned and this really effected her - these are the more recent stressors. Over the years it does seem that her mental health worsens with stress. She is convinced she is manic, but he is not sure. She sounds perfectly fine over the phone. He doesn't think she is in a manic state yet, but knows that she has been manic before. This time around is just so weird because she seemed like she was doing fine. They usually notice when she is going into some sort of manic state because there are more signs. There were no signs this time aside from being stressed out. She got home one night and within an hour she just had to go to the hospital. It was really weird to him.    Past Medical History:  Past Medical History:  Diagnosis Date   Anxiety    Back spasm    Bipolar I disorder (HCC)    Borderline personality disorder (HCC)    Class 3 obesity (HCC)    Cluster B personality disorder (HCC)    Hypercholesterolemia    Hypertension    Hypertriglyceridemia    Somatic  symptom disorder    Tachycardia    Type 2 diabetes mellitus (HCC)    Vitamin D  insufficiency     Past Surgical History:  Procedure Laterality Date   APPENDECTOMY     CHOLECYSTECTOMY     HAND SURGERY     SHOULDER SURGERY     Family History:  Family History  Problem Relation Age of Onset   Hypertension Mother    Hyperlipidemia Mother    Suicidality Father    Depression Father    Social History:  Social History   Substance and Sexual Activity  Alcohol Use No     Social History   Substance and Sexual Activity  Drug Use No    Social History   Socioeconomic History   Marital status: Married    Spouse name: Not on file   Number of children: Not on file   Years of education: Not on file   Highest education level: Not on file  Occupational History   Not on file  Tobacco Use   Smoking status: Every Day    Current packs/day: 0.50    Average packs/day: 0.5 packs/day for 5.7 years (2.8 ttl pk-yrs)    Types: Cigarettes    Start date: 11/2018  Last attempt to quit: 09/10/2016   Smokeless tobacco: Never  Vaping Use   Vaping status: Never Used  Substance and Sexual Activity   Alcohol use: No   Drug use: No   Sexual activity: Not on file  Other Topics Concern   Not on file  Social History Narrative   Not on file   Social Drivers of Health   Financial Resource Strain: Not on file  Food Insecurity: Food Insecurity Present (07/04/2024)   Hunger Vital Sign    Worried About Running Out of Food in the Last Year: Sometimes true    Ran Out of Food in the Last Year: Never true  Transportation Needs: No Transportation Needs (07/04/2024)   PRAPARE - Administrator, Civil Service (Medical): No    Lack of Transportation (Non-Medical): No  Physical Activity: Not on file  Stress: Stress Concern Present (11/16/2023)   Received from [facility] Occupational [facility] - Occupational Stress Questionnaire    Feeling of Stress : Very much  Social  Connections: Unknown (01/10/2022)   Received from [facility]   Social Network    Social Network: Not on file    Current Medications: Current Facility-Administered Medications  Medication Dose Route Frequency Provider Last Rate Last Admin   acetaminophen  (TYLENOL ) tablet 650 mg  650 mg Oral Q6H PRN McLauchlin, Angela, NP   650 mg at 07/08/24 0856   alum & mag hydroxide-simeth (MAALOX/MYLANTA) 200-200-20 MG/5ML suspension 30 mL  30 mL Oral Q4H PRN McLauchlin, Angela, NP       clonazePAM  (KLONOPIN ) tablet 0.5 mg  0.5 mg Oral Daily PRN Towana Leita SAILOR, MD   0.5 mg at 07/07/24 1655   haloperidol  (HALDOL ) tablet 5 mg  5 mg Oral Q6H PRN Towana Leita SAILOR, MD       Or   haloperidol  lactate (HALDOL ) injection 5 mg  5 mg Intramuscular Q6H PRN Aneya Daddona N, MD   5 mg at 07/06/24 1508   hydrOXYzine  (ATARAX ) tablet 25 mg  25 mg Oral TID PRN McLauchlin, Angela, NP   25 mg at 07/07/24 2021   lamoTRIgine  (LAMICTAL ) tablet 250 mg  250 mg Oral Daily Towana Leita SAILOR, MD   250 mg at 07/08/24 9144   magnesium  hydroxide (MILK OF MAGNESIA) suspension 30 mL  30 mL Oral Daily PRN McLauchlin, Angela, NP       melatonin tablet 3 mg  3 mg Oral QHS Towana Leita SAILOR, MD   3 mg at 07/07/24 2021   memantine  (NAMENDA ) tablet 10 mg  10 mg Oral BID Towana Leita SAILOR, MD   10 mg at 07/08/24 9144   metFORMIN  (GLUCOPHAGE ) tablet 500 mg  500 mg Oral Q breakfast Towana Leita SAILOR, MD   500 mg at 07/08/24 0856   propranolol  ER (INDERAL  LA) 24 hr capsule 60 mg  60 mg Oral Daily Dandrea Widdowson N, MD   60 mg at 07/08/24 9143   QUEtiapine  (SEROQUEL  XR) 24 hr tablet 250 mg  250 mg Oral QHS Leven Hoel N, MD   250 mg at 07/07/24 2022   QUEtiapine  (SEROQUEL ) tablet 25 mg  25 mg Oral BID WC Towana Leita SAILOR, MD   25 mg at 07/08/24 0855   traZODone  (DESYREL ) tablet 50 mg  50 mg Oral QHS Christian Treadway N, MD   50 mg at 07/07/24 2021    Lab Results:  No results found for this or any previous visit (from the past 48 hours).  Blood Alcohol  level:  Lab Results  Component Value Date   [facility] <15 07/04/2024   ETH <10 08/10/2022    Metabolic Disorder Labs: Lab Results  Component Value Date   HGBA1C 5.4 07/04/2024   MPG 108.28 07/04/2024   MPG 119.76 12/07/2023   No results found for: PROLACTIN Lab Results  Component Value Date   CHOL 223 (H) 07/04/2024   TRIG 100 07/04/2024   HDL 53 07/04/2024   CHOLHDL 4.2 07/04/2024   VLDL 20 07/04/2024   LDLCALC 150 (H) 07/04/2024   LDLCALC 92 12/07/2023    Physical Findings:  Mental Status exam: Appearance: white female of slightly elevated BMI, appropriately groomed in casual clothing, seen sitting in her room Eye contact: good  Attitude towards examiner: overall cooperative today Psychomotor: no agitation or retardation  Speech: normal in prosody; slow and meandering; not pressured  Language: no delays  Mood: not good  Affect: congruent, restricted and anxious  Thought content: denying SI and HI (reporting intrusive thoughts of harming self and throwing son into a pool but no desire or intent to act on thoughts), no delusions expressed  Thought Process: linear, organized and goal-directed  Perception: denying AVH, not RTIS  Insight: fair  Judgement: fair    Orientation: x3 Attention/Concentration: good - attends to interview  Memory/Cognition: grossly intact   Fund of Knowledge: Average      Musculoskeletal: Strength & Muscle Tone: within normal limits Gait & Station: normal Patient leans: N/A    Physical Exam Constitutional:      Appearance: Normal appearance.  HENT:     Head: Normocephalic and atraumatic.  Pulmonary:     Effort: Pulmonary effort is normal.  Abdominal:     General: There is no distension.  Musculoskeletal:        General: Normal range of motion.     Cervical back: Normal range of motion.  Neurological:     General: No focal deficit present.     Mental Status: She is alert.    Review of Systems  All other systems reviewed and  are negative.  Blood pressure 124/87, pulse 87, temperature 98.5 F (36.9 C), temperature source Oral, resp. rate 17, height 5' 6 (1.676 m), weight 106.7 kg, SpO2 97%. Body mass index is 37.96 kg/m.   Treatment Plan Summary: Daily contact with patient to assess and evaluate symptoms and progress in treatment  Assessment: The patient is a 38 y.o. female with a psychiatric history most consistent with bipolar 1 disorder, current episode mixed and cluster B traits. She had carried a bipolar II diagnosis through much of adult life with worsening of symptoms occurring post-partum at age 77. Since then she has had 4 psychiatric admissions, 2 for credible manic episodes with psychosis. She has additionally presented with recurrent depressive episodes, although no suicide attempts. Mood symptoms are confounded by cluster B traits with clear history of unstable relationships, affective lability, poor coping, recurrent SI. On this admission the patient presents largely with concerns related to high anxiety and intrusive thoughts. Although reporting concerns for mania, aside from mildly reduced sleep (5 hrs) and rapidly cycling thoughts she does not present with endorsed active manic symptoms and does not present with observable signs of mania.  Given this admission was precipitated by several recent psychosocial stressors, personality pathology may be contributing to worsening of symptoms  to some degree. Regardless, she has endorsed significant distress with current intrusive thoughts of harm to self/others, overall high anxiety and urges to count to  reduce this more consistent with an OCD picture.     To address high anxiety/distress related to voices Seroquel  IR 12.5 BID was initiated 10/29 and will increased to 25 mg as of lunch dose 10/30. Evening seroquel  was increased to 250 mg to address sleep concerns and any manic/hypomanic component, although OCD appears more likely at this time. Given high anxiety  and subjective feeling of poor sleep and rapidly cycling thoughts lamotrigine  was increased to 250 mg.   Today the patient remained with no significant changes in intrusive thoughts of harming herself or throwing son into a pool. Continues to deny any actual intent to do this - is afraid to go home in case she acts on them. Overall not feeling any significant benefit in anxiety or reduction in intrusive thoughts with daytime seroquel  - just emotional numbing. Continues to report poor sleep. Given this, will consolidate all seroquel  to evening time by increasing nighttime dose to 300 mg and discontinuing day-time. As intrusive thoughts appear more related to OCD, at this time will move to begin low-dose clomipramine  at 25 mg BID this afternoon (one dose today). Discussed with patient risk of worsening manic symptoms and to keep an eye on it, however she has not demonstrated observable signs of mania since admission (appears fatigued, speech is slow, no signs of psychosis, etc.). Lamotrigine  250 + seroquel  300 qhS are likely to prevent any manic symptoms from occurring. Finally given reports of paradoxical worsening of anxiety with hydroxyzine  will discontinue this and allow for 0.5 klonopin  BID for severe anxiety. Notified patient she will not go out on this medication and to utilize sparingly - voiced understanding.     DSM-5 diagnoses: Bipolar I disorder, current episode mixed, no current psychotic features  Obsessive-Compulsive disorder  Cluster B traits              -r/o BPD   Plan:   Legal Status: -voluntary    Safety -q15 minute checks  -elopement, suicide and assault precautions  -daily vitals   Psychiatric Concerns  -Increase to Lamotrigine  250 mg daily for mood  -Continue Namenda  10 mg BID for reported intrusive thoughts (patient takes at home and reports benefit) -Continue Trazodone  50 mg at bedtime for sleep in case of paradoxical worsening of sleep in the setting of bipolar  disorder -Increase Seroquel  XR to 300 mg at bedtime for sleep and mood stabilization -Discntinue Seroquel  IR  (fatigue, emotional numbing, no significant benefit with intrusive thoughts -begin Clomipramine  25 mg BID (will receive 1 evening dose today) for intrusive thoughts related to OCD  -Discontinue PRN hydroxyzine  (reports worsening of anxiety) -Continue PRN haldol  5 mg PO or IM q6h for agitation   Substance use concerns  -none currently    Nicotine  Replacement  N/A    Medical concerns Pre-diabetes -Current A1c WNL (5.4) but was elevated 7 months ago prior to beginning metformin  -Continue metformin  500 mg daily      Additional PRNs: -Tylenol  tablets 650 mg every 6 hours as needed for pain -Maalox/Mylanta suspension 30 mL every 4 hours as needed for indigestion  -Milk of Magnesia 30 mL daily as needed for constipation   Labs -Reviewed as documented in HPI   Psychosocial interventions  -daily medication management with psychiatry -Medication education regarding risks/benefits and alternatives -bedside psychotherapy as indicated  -Patient will be encouraged to participate and engage with group therapy  -Appreciate SW assistance in coordinating safe disposition    Leita LOISE Arts, MD 07/08/2024, 10:35 AM

## 2024-07-08 NOTE — Progress Notes (Signed)
(  Sleep Hours) -7 (Any PRNs that were needed, meds refused, or side effects to meds)- Hydroxyzine  (Any disturbances and when (visitation, over night)-none (Concerns raised by the patient)- none (SI/HI/AVH)-endorses SI

## 2024-07-08 NOTE — BHH Group Notes (Signed)
 The focus of this group is to help patients establish daily goals to achieve during treatment and discuss how the patient can incorporate goal setting into their daily lives to aide in recovery.    Scale 1-10:  2    Goal: Feel better

## 2024-07-08 NOTE — BHH Group Notes (Signed)
 BHH Assessment Progress Note    Pt attended group

## 2024-07-08 NOTE — Group Note (Signed)
 LCSW Group Therapy Note  Group Date: 07/08/2024 Start Time: 1015 End Time: 1115   Type of Therapy and Topic:  Group Therapy: Self-Harm Alternatives  Participation Level:  Minimal   Description of Group:   Patients participated in a discussion regarding non-suicidal self-injurious behavior (NSSIB, or self-harm) and the stigma surrounding it. There was also discussion surrounding how other maladaptive coping skills could be seen as self-harm, such as substance abuse. Participants were invited to share their experiences with self-harm, with emphasis being placed on the motivation for self-harm (such as release, punishment, feeling numb, etc). Patients were then asked to brainstorm potential substitutions for self-harm and were provided with a handout entitled, Distraction Techniques and Alternative Coping Strategies, published by [employer] Self-Injury Recovery.  Therapeutic Goals:  Patients will be given the opportunity to discuss NSSIB in a non-judgmental and therapeutic environment. Patients will identify which feelings lead to NSSIB.  Patients will discuss potential healthy coping skills to replace NSSIB Open discussion will specifically address stigma and shame surrounding NSSIB.   Summary of Patient Progress:  Kara Stephens was present the initial 20 minutes of the the session and proved open to feedback from CSW and peers. Patient demonstrated minimum insight into the subject matter, was respectful of peers, and was present throughout the entire session.  Therapeutic Modalities:   Cognitive Behavioral Therapy   Kara Stephens 07/08/2024  4:11 PM

## 2024-07-08 NOTE — BHH Group Notes (Signed)
 Patient attended the Social work group.

## 2024-07-08 NOTE — BHH Group Notes (Signed)
 Adult Psychoeducational Group Note  Date:  07/08/2024 Time:  8:47 PM  Group Topic/Focus:  Wrap-Up Group:   The focus of this group is to help patients review their daily goal of treatment and discuss progress on daily workbooks.  Participation Level:  Active  Participation Quality:  Appropriate  Affect:  Appropriate  Cognitive:  Appropriate  Insight: Appropriate  Engagement in Group:  Engaged  Modes of Intervention:  Discussion  Additional Comments:  Pt told that today was a difficult day on the unit due to her feelings of anxiety, which she worked to overcome by not isolating in her room. On the subject of goals for the coming week, Pt mentioned wanting to be more aware of herself and her actions. Pt rated her day a 2 out of 10.  Kara Stephens 07/08/2024, 8:47 PM

## 2024-07-09 DIAGNOSIS — F429 Obsessive-compulsive disorder, unspecified: Secondary | ICD-10-CM | POA: Diagnosis not present

## 2024-07-09 DIAGNOSIS — F3162 Bipolar disorder, current episode mixed, moderate: Secondary | ICD-10-CM | POA: Diagnosis not present

## 2024-07-09 MED ORDER — CLONAZEPAM 0.25 MG PO TBDP
0.5000 mg | ORAL_TABLET | Freq: Every day | ORAL | Status: DC
  Administered 2024-07-09 – 2024-07-23 (×15): 0.5 mg via ORAL
  Filled 2024-07-09 (×15): qty 2

## 2024-07-09 NOTE — BHH Group Notes (Signed)
 Adult Psychoeducational Group Note  Date:  07/09/2024 Time:  8:43 PM  Group Topic/Focus:  Wrap-Up Group:   The focus of this group is to help patients review their daily goal of treatment and discuss progress on daily workbooks.  Participation Level:  Active  Participation Quality:  Appropriate  Affect:  Appropriate  Cognitive:  Appropriate  Insight: Appropriate  Engagement in Group:  Engaged  Modes of Intervention:  Discussion  Additional Comments:  Pt told that today was another difficult day on the unit due to her anxiety, though she was happy to receive a visit from her husband, whom she considers supportive. On the subject of ways to stay well upon discharge, Pt mentioned being more self-aware of her mental health. Pt rated her day a 2 out of 10.  Kara Stephens 07/09/2024, 8:43 PM

## 2024-07-09 NOTE — Progress Notes (Signed)
 [facility] MD Progress Note  07/09/2024 7:35 AM Kara Stephens  MRN:  [id number]  Principal Problem: Bipolar 1 disorder, mixed, moderate (HCC) Diagnosis: Principal Problem:   Bipolar 1 disorder, mixed, moderate (HCC) Active Problems:   Obsessive compulsive disorder  Total Time spent with patient: 35 minutes   Identifying Information and Past Psychiatric History:  The patient is a 38 y.o. female (domiciled with husband and children,) with a medical history of pre-diabetes and a psychiatric history most consistent with bipolar 1 disorder, OCD and cluster B traits, currently admitted due to high anxiety related to intrusive thoughts of suicide (without actual SI).   Psychiatric history notable for prior bipolar II diagnosis with approximately 5 years of stability on lamotrigine . Symptoms reoccurred with worsening severity after the birth of her second child at age 70. Since then she has had at least 2 credible manic symptoms with psychosis. Symptoms have included racing thoughts, reduced sleep, impulsivity, pressured speech and psychomotor agitation and have included psychotic symptoms such as hallucinations. In addition to elevated mood states, the patient has had recurrent depressive symptoms characterized by low mood, anhedonia, irritability, low energy and negative intrusive thoughts. She has had 4 hospitalizations in the last 2-3 years for depression and mania: March 2025 for mania, February 2025 for mania, December 2023 bipolar mixed episode with catatonic features and OCD. She was most recently admitted from 12/09/23-12/14/23 at [facility] self-admitted largely for anxiety symptoms, feeling that her mania was not well controlled. She has no prior suicide attempts. She has been on numerous medications for management of mood symptoms as noted below with numerous side effects/intolerances or poor effects. She does present with instability in relationships, poor coping, recurrent SI, affective lability concerning for  cluster B traits confounding mood symptoms. Patient has no prior suicide attempts. Currently follows outpatient with Dr. Chyrl Na for med management and she sees Velinda Beat for therapy.   Interval events: The patient was documented to be attending groups, sometimes with minimal involvement, sometimes more engaged.  Did receive PRN klonopin  yesterday afternoon and again overnight around midnight. Staff documenting 8.25 hrs of sleep, patient subjectively reporting 2-3 hours of sleep. Compliant with medications, including clomipramine , and no behavioral concerns reported.  BP (!) 137/90   Pulse 98   Temp 98.3 F (36.8 C) (Oral)   Resp 17   Ht 5' 6 (1.676 m)   Wt 106.7 kg   SpO2 97%   BMI 37.96 kg/m     Interview today: Today the patient states she is the same - I feel like the thoughts are worse. Reports she was very anxious last night and woke in the middle of the night highly anxious as well. Continues to have intrusive thoughts about harming herself or throwing son in the pool, none of which she wants to act on and is distressing. States she is intermittently unable to engage in groups or talk to others because the anxiety and thoughts get too much. Discussed again pushing through as best as she is able and utilizing coping mechanisms to decrease distress. Continues to report poor sleep - subjectively feeling that she is only sleeping 2-3 hrs a night. Denying SI, HI and AVH today.     Collateral: Husband, Anthony Wuthrich [phone]  -Contacted on 07/08/24 for further information and to provide update. See that day's progress note for details.    Past Medical History:  Past Medical History:  Diagnosis Date   Anxiety    Back spasm    Bipolar I  disorder (HCC)    Borderline personality disorder (HCC)    Class 3 obesity (HCC)    Cluster B personality disorder (HCC)    Hypercholesterolemia    Hypertension    Hypertriglyceridemia    Somatic symptom disorder    Tachycardia     Type 2 diabetes mellitus (HCC)    Vitamin D  insufficiency     Past Surgical History:  Procedure Laterality Date   APPENDECTOMY     CHOLECYSTECTOMY     HAND SURGERY     SHOULDER SURGERY     Family History:  Family History  Problem Relation Age of Onset   Hypertension Mother    Hyperlipidemia Mother    Suicidality Father    Depression Father    Social History:  Social History   Substance and Sexual Activity  Alcohol Use No     Social History   Substance and Sexual Activity  Drug Use No    Social History   Socioeconomic History   Marital status: Married    Spouse name: Not on file   Number of children: Not on file   Years of education: Not on file   Highest education level: Not on file  Occupational History   Not on file  Tobacco Use   Smoking status: Every Day    Current packs/day: 0.50    Average packs/day: 0.5 packs/day for 5.7 years (2.8 ttl pk-yrs)    Types: Cigarettes    Start date: 11/2018    Last attempt to quit: 09/10/2016   Smokeless tobacco: Never  Vaping Use   Vaping status: Never Used  Substance and Sexual Activity   Alcohol use: No   Drug use: No   Sexual activity: Not on file  Other Topics Concern   Not on file  Social History Narrative   Not on file   Social Drivers of Health   Financial Resource Strain: Not on file  Food Insecurity: Food Insecurity Present (07/04/2024)   Hunger Vital Sign    Worried About Running Out of Food in the Last Year: Sometimes true    Ran Out of Food in the Last Year: Never true  Transportation Needs: No Transportation Needs (07/04/2024)   PRAPARE - Administrator, Civil Service (Medical): No    Lack of Transportation (Non-Medical): No  Physical Activity: Not on file  Stress: Stress Concern Present (11/16/2023)   Received from [facility] - Occupational Stress Questionnaire    Feeling of Stress : Very much  Social Connections: Unknown (01/10/2022)    Received from [facility]   Social Network    Social Network: Not on file    Current Medications: Current Facility-Administered Medications  Medication Dose Route Frequency Provider Last Rate Last Admin   acetaminophen  (TYLENOL ) tablet 650 mg  650 mg Oral Q6H PRN McLauchlin, Angela, NP   650 mg at 07/08/24 1818   alum & mag hydroxide-simeth (MAALOX/MYLANTA) 200-200-20 MG/5ML suspension 30 mL  30 mL Oral Q4H PRN McLauchlin, Angela, NP       clomiPRAMINE  (ANAFRANIL ) capsule 25 mg  25 mg Oral BID Towana Leita SAILOR, MD   25 mg at 07/08/24 1744   clonazePAM  (KLONOPIN ) tablet 0.5 mg  0.5 mg Oral BID PRN Towana Leita SAILOR, MD   0.5 mg at 07/09/24 0026   haloperidol  (HALDOL ) tablet 5 mg  5 mg Oral Q6H PRN Towana Leita SAILOR, MD       Or   haloperidol  lactate (  HALDOL ) injection 5 mg  5 mg Intramuscular Q6H PRN Camron Essman N, MD   5 mg at 07/06/24 1508   lamoTRIgine  (LAMICTAL ) tablet 250 mg  250 mg Oral Daily Towana Leita SAILOR, MD   250 mg at 07/08/24 9144   magnesium  hydroxide (MILK OF MAGNESIA) suspension 30 mL  30 mL Oral Daily PRN McLauchlin, Angela, NP       melatonin tablet 3 mg  3 mg Oral QHS Maely Clements N, MD   3 mg at 07/08/24 2054   memantine  (NAMENDA ) tablet 10 mg  10 mg Oral BID Winta Barcelo N, MD   10 mg at 07/08/24 1818   metFORMIN  (GLUCOPHAGE ) tablet 500 mg  500 mg Oral Q breakfast Towana Leita SAILOR, MD   500 mg at 07/08/24 0856   propranolol  ER (INDERAL  LA) 24 hr capsule 60 mg  60 mg Oral Daily Crisanto Nied N, MD   60 mg at 07/08/24 9143   QUEtiapine  (SEROQUEL  XR) 24 hr tablet 300 mg  300 mg Oral QHS Cayle Thunder N, MD   300 mg at 07/08/24 2053   traZODone  (DESYREL ) tablet 50 mg  50 mg Oral QHS Melaya Hoselton N, MD   50 mg at 07/08/24 2053    Lab Results:  No results found for this or any previous visit (from the past 48 hours).   Blood Alcohol level:  Lab Results  Component Value Date   [facility] <15 07/04/2024   ETH <10 08/10/2022    Metabolic Disorder Labs: Lab Results   Component Value Date   HGBA1C 5.4 07/04/2024   MPG 108.28 07/04/2024   MPG 119.76 12/07/2023   No results found for: PROLACTIN Lab Results  Component Value Date   CHOL 223 (H) 07/04/2024   TRIG 100 07/04/2024   HDL 53 07/04/2024   CHOLHDL 4.2 07/04/2024   VLDL 20 07/04/2024   LDLCALC 150 (H) 07/04/2024   LDLCALC 92 12/07/2023    Physical Findings:  Mental Status exam: Appearance: white female of slightly elevated BMI, appropriately groomed in casual clothing, seen sitting in her room Eye contact: good  Attitude towards examiner: overall cooperative today Psychomotor: no agitation or retardation  Speech: normal in prosody; slow and meandering; not pressured  Language: no delays  Mood: the same -intrusive thoughts are worse  Affect: congruent, restricted and anxious  Thought content: denying SI and HI (reporting intrusive thoughts of harming self and throwing son into a pool but no desire or intent to act on thoughts), no delusions expressed  Thought Process: linear, organized and goal-directed  Perception: denying AVH, not RTIS  Insight: fair  Judgement: fair    Orientation: x3 Attention/Concentration: good - attends to interview  Memory/Cognition: grossly intact   Fund of Knowledge: Average      Musculoskeletal: Strength & Muscle Tone: within normal limits Gait & Station: normal Patient leans: N/A    Physical Exam Constitutional:      Appearance: Normal appearance.  HENT:     Head: Normocephalic and atraumatic.  Pulmonary:     Effort: Pulmonary effort is normal.  Abdominal:     General: There is no distension.  Musculoskeletal:        General: Normal range of motion.     Cervical back: Normal range of motion.  Neurological:     General: No focal deficit present.     Mental Status: She is alert.    Review of Systems  All other systems reviewed and are negative.  Blood pressure ROLLEN)  137/90, pulse 98, temperature 98.3 F (36.8 C), temperature  source Oral, resp. rate 17, height 5' 6 (1.676 m), weight 106.7 kg, SpO2 97%. Body mass index is 37.96 kg/m.   Treatment Plan Summary: Daily contact with patient to assess and evaluate symptoms and progress in treatment  Assessment: The patient is a 38 y.o. female with a psychiatric history most consistent with bipolar 1 disorder, current episode mixed and cluster B traits. She had carried a bipolar II diagnosis through much of adult life with worsening of symptoms occurring post-partum at age 84. Since then she has had 4 psychiatric admissions, 2 for credible manic episodes with psychosis. She has additionally presented with recurrent depressive episodes, although no suicide attempts. Mood symptoms are confounded by cluster B traits with clear history of unstable relationships, affective lability, poor coping, recurrent SI. On this admission the patient presents largely with concerns related to high anxiety and intrusive thoughts. Although reporting concerns for mania, aside from mildly reduced sleep (5 hrs) and rapidly cycling thoughts she does not present with endorsed active manic symptoms and does not present with observable signs of mania.  Given this admission was precipitated by several recent psychosocial stressors, personality pathology may be contributing to worsening of symptoms  to some degree. Regardless, she has endorsed significant distress with current intrusive thoughts of harm to self/others, overall high anxiety and urges to count to reduce this more consistent with an OCD picture.     To address high anxiety/distress related to voices Seroquel  IR 12.5 BID was initiated 10/29 and will increased to 25 mg as of lunch dose 10/30. Ultimately discontinued 11/1 due to reports of emotional numbing and no significant improvements in anxiety or intrusive thoughts. Instead, evening seroquel  was increased to 300 mg to consolidate dose and to help address subjective poor sleep. Given poor response  to daytime seroquel  and hydroxyzine , PRN klonopin  0.5 mg BID was started to be reserved for severe anxiety only - patient notified that she would not get a script for this upon discharge and voiced understanding.  .Lamotrigine  was increased to 250 mg (morning of 11/1) to address any underlying hypomanic symptoms and to prevent any manic symptoms from occurring given plan to address OCD with clomipramine . This was started yesterday afternoon (11/1) at 25 mg BID as she remained with no significant changes in intrusive thoughts of harming herself or throwing son into a pool. Discussed with patient risk of worsening manic symptoms and to keep an eye on it, however she has not demonstrated observable signs of mania since admission (appears fatigued, speech is slow, no signs of psychosis, etc.).   Today the patient continued to report subjective poor sleep (staff documenting >8 hrs, patient reporting 2 hrs) and no change in intrusive thoughts. Continues to deny actual SI, HI or AVH but distressing intrusive thoughts of suicide and throwing 74-year-old into a pool. Will schedule 0.5 mg klonopin  at bedtime at night for sleep but no other changes to medications at this time. Patient encouraged to utilize coping skills to push through anxiety and intrusive thoughts.     DSM-5 diagnoses: Bipolar I disorder, current episode mixed, no current psychotic features  Obsessive-Compulsive disorder  Cluster B traits              -r/o BPD   Plan:   Legal Status: -voluntary    Safety -q15 minute checks  -elopement, suicide and assault precautions  -daily vitals   Psychiatric Concerns  -Continue to Lamotrigine  250 mg daily for  mood  -Continue Namenda  10 mg BID for reported intrusive thoughts (patient takes at home and reports benefit) -Continue Trazodone  50 mg at bedtime for sleep (reduced dose in case of paradoxical worsening) -Continue Seroquel  XR to 300 mg at bedtime for sleep and mood stabilization -Continue  Clomipramine  25 mg BID (started evening 11/1 for intrusive thoughts related to OCD  -likely increase to 50 BID on Tuesday -Continue Klonopin  0.5 mg BID PRN for anxiety + scheduled 0.5 mg at bedtime for sleep   -patient notified klonopin  will not be continued after hospital and to reserve for severe anxiety only  -Continue PRN haldol  5 mg PO or IM q6h for agitation   -Discontinued PRN hydroxyzine  (reports worsening of anxiety) -Discontinue BID seroquel  12.5 and then 25 due to no improvement in anxiety or intrusive thoughts and reports of emotional numbing     Substance use concerns  -none currently    Nicotine  Replacement  N/A    Medical concerns Pre-diabetes -Current A1c WNL (5.4) but was elevated 7 months ago prior to beginning metformin  -Continue metformin  500 mg daily      Additional PRNs: -Tylenol  tablets 650 mg every 6 hours as needed for pain -Maalox/Mylanta suspension 30 mL every 4 hours as needed for indigestion  -Milk of Magnesia 30 mL daily as needed for constipation   Labs -Reviewed as documented in HPI   Psychosocial interventions  -daily medication management with psychiatry -Medication education regarding risks/benefits and alternatives -bedside psychotherapy as indicated  -Patient will be encouraged to participate and engage with group therapy  -Appreciate SW assistance in coordinating safe disposition    Leita LOISE Arts, MD 07/09/2024, 7:35 AM

## 2024-07-09 NOTE — Plan of Care (Signed)

## 2024-07-09 NOTE — Plan of Care (Signed)

## 2024-07-09 NOTE — Progress Notes (Signed)
(  Sleep Hours) - 8.25 (Any PRNs that were needed, meds refused, or side effects to meds)-klonopin  (Any disturbances and when (visitation, over night) (Concerns raised by the patient)- none (SI/HI/AVH)-denies

## 2024-07-09 NOTE — Progress Notes (Signed)
   07/09/24 0812  Psych Admission Type (Psych Patients Only)  Admission Status Voluntary  Psychosocial Assessment  Patient Complaints Anxiety  Eye Contact Fair  Facial Expression Anxious  Affect Anxious  Speech Slow  Interaction Assertive  Motor Activity Slow  Appearance/Hygiene Unremarkable  Behavior Characteristics Anxious  Mood Anxious  Thought Process  Coherency Disorganized  Content Preoccupation  Delusions None reported or observed  Perception WDL  Hallucination Auditory  Judgment Poor  Confusion None  Danger to Self  Current suicidal ideation? Denies  Agreement Not to Harm Self Yes  Description of Agreement Verbal  Danger to Others  Danger to Others None reported or observed  Danger to Others Abnormal  Harmful Behavior to others No threats or harm toward other people

## 2024-07-09 NOTE — Group Note (Signed)
 Date:  07/09/2024 Time:  10:18 AM  Group Topic/Focus:  Goals Group:   The focus of this group is to help patients establish daily goals to achieve during treatment and discuss how the patient can incorporate goal setting into their daily lives to aide in recovery.    Participation Level:  Active  Participation Quality:  Appropriate  Affect:  Appropriate  Cognitive:  Appropriate  Insight: Good  Engagement in Group:  Engaged  Modes of Intervention:  Discussion  Additional Comments:  na  Kara Stephens 07/09/2024, 10:18 AM

## 2024-07-09 NOTE — Plan of Care (Signed)
   Problem: Education: Goal: Knowledge of [facility] Education information/materials will improve Outcome: Progressing   Problem: Activity: Goal: Interest or engagement in activities will improve Outcome: Progressing   Problem: Coping: Goal: Ability to verbalize frustrations and anger appropriately will improve Outcome: Progressing   Problem: Safety: Goal: Periods of time without injury will increase Outcome: Progressing

## 2024-07-10 ENCOUNTER — Encounter (HOSPITAL_COMMUNITY): Payer: Self-pay

## 2024-07-10 DIAGNOSIS — F429 Obsessive-compulsive disorder, unspecified: Secondary | ICD-10-CM | POA: Diagnosis not present

## 2024-07-10 DIAGNOSIS — F3162 Bipolar disorder, current episode mixed, moderate: Secondary | ICD-10-CM | POA: Diagnosis not present

## 2024-07-10 MED ORDER — CLOMIPRAMINE HCL 25 MG PO CAPS
50.0000 mg | ORAL_CAPSULE | Freq: Two times a day (BID) | ORAL | Status: DC
  Administered 2024-07-10 – 2024-07-17 (×14): 50 mg via ORAL
  Filled 2024-07-10 (×21): qty 2

## 2024-07-10 NOTE — Progress Notes (Signed)
(  Sleep Hours) -8.25  (Any PRNs that were needed, meds refused, or side effects to meds)- none  (Any disturbances and when (visitation, over night)-none  (Concerns raised by the patient)- anxiety  (SI/HI/AVH)-denies SI. Admits having continued intrusive thoughts of killing son by throwing him in mother-in-law's pool  Patient came out into hallway asking for sleep meds this am at approximately 0240. Calmly verbalized understanding and ambulated back to her room when RN explained unable to administer sleep aids after 0200. (*Patient's sleep possibly disturbed by a different patient being very disruptive on the unit around 0230 this am)

## 2024-07-10 NOTE — Plan of Care (Signed)
   Problem: Education: Goal: Emotional status will improve Outcome: Progressing Goal: Mental status will improve Outcome: Progressing Goal: Verbalization of understanding the information provided will improve Outcome: Progressing

## 2024-07-10 NOTE — Progress Notes (Signed)
(  Sleep Hours) - 7.5 (Any PRNs that were needed, meds refused, or side effects to meds)- none  (Any disturbances and when (visitation, over night)- n/a (Concerns raised by the patient)- none (SI/HI/AVH)- denies

## 2024-07-10 NOTE — Progress Notes (Signed)
 Collateral contact - [employer].  Phone:  [phone].  Fax: [phone]   CSW was informed that the fax hasn't been received.  CSW faxed it to [phone].  CSW to confirm if the fax was received.  CSW was informed that she will get a phone call back.   Kamylle Axelson, LCSWA 07/10/2024

## 2024-07-10 NOTE — BH IP Treatment Plan (Signed)
 Interdisciplinary Treatment and Diagnostic Plan Update  07/10/2024 Time of Session: 12:20 PM - UPDATE Kara Stephens MRN: [id number]  Principal Diagnosis: Bipolar 1 disorder, mixed, moderate (HCC)  Secondary Diagnoses: Principal Problem:   Bipolar 1 disorder, mixed, moderate (HCC) Active Problems:   Obsessive compulsive disorder   Current Medications:  Current Facility-Administered Medications  Medication Dose Route Frequency Provider Last Rate Last Admin   acetaminophen  (TYLENOL ) tablet 650 mg  650 mg Oral Q6H PRN McLauchlin, Angela, NP   650 mg at 07/08/24 1818   alum & mag hydroxide-simeth (MAALOX/MYLANTA) 200-200-20 MG/5ML suspension 30 mL  30 mL Oral Q4H PRN McLauchlin, Angela, NP       clomiPRAMINE  (ANAFRANIL ) capsule 50 mg  50 mg Oral BID Butler, Laura N, MD   50 mg at 07/10/24 1700   clonazePAM  (KLONOPIN ) disintegrating tablet 0.5 mg  0.5 mg Oral QHS Towana Leita SAILOR, MD   0.5 mg at 07/09/24 2047   clonazePAM  (KLONOPIN ) tablet 0.5 mg  0.5 mg Oral BID PRN Towana Leita SAILOR, MD   0.5 mg at 07/10/24 1414   haloperidol  (HALDOL ) tablet 5 mg  5 mg Oral Q6H PRN Towana Leita SAILOR, MD       Or   haloperidol  lactate (HALDOL ) injection 5 mg  5 mg Intramuscular Q6H PRN Towana Leita SAILOR, MD   5 mg at 07/06/24 1508   lamoTRIgine  (LAMICTAL ) tablet 250 mg  250 mg Oral Daily Towana Leita SAILOR, MD   250 mg at 07/10/24 9187   magnesium  hydroxide (MILK OF MAGNESIA) suspension 30 mL  30 mL Oral Daily PRN McLauchlin, Angela, NP       melatonin tablet 3 mg  3 mg Oral QHS Towana Leita SAILOR, MD   3 mg at 07/09/24 2047   memantine  (NAMENDA ) tablet 10 mg  10 mg Oral BID Towana Leita SAILOR, MD   10 mg at 07/10/24 1700   metFORMIN  (GLUCOPHAGE ) tablet 500 mg  500 mg Oral Q breakfast Towana Leita SAILOR, MD   500 mg at 07/10/24 9187   propranolol  ER (INDERAL  LA) 24 hr capsule 60 mg  60 mg Oral Daily Butler, Laura N, MD   60 mg at 07/10/24 0813   QUEtiapine  (SEROQUEL  XR) 24 hr tablet 300 mg  300 mg Oral QHS Towana Leita SAILOR,  MD   300 mg at 07/09/24 2046   traZODone  (DESYREL ) tablet 50 mg  50 mg Oral QHS Butler, Laura N, MD   50 mg at 07/09/24 2047   PTA Medications: Medications Prior to Admission  Medication Sig Dispense Refill Last Dose/Taking   albuterol  (VENTOLIN  HFA) 108 (90 Base) MCG/ACT inhaler Inhale 2 puffs into the lungs every 4 (four) hours as needed for wheezing or shortness of breath (cough, shortness of breath or wheezing.). (Patient not taking: Reported on 07/04/2024) 6.7 g 3    aspirin EC 81 MG tablet Take 81 mg by mouth daily. Swallow whole.      Cholecalciferol  (VITAMIN D -3 PO) Take 1 capsule by mouth daily.      clonazePAM  (KLONOPIN ) 0.5 MG tablet Take 0.5 mg by mouth daily as needed for anxiety.      lamoTRIgine  (LAMICTAL ) 200 MG tablet Take 200 mg by mouth daily.      memantine  (NAMENDA ) 10 MG tablet Take 1 tablet (10 mg total) by mouth every 12 (twelve) hours. 60 tablet 0    metFORMIN  (GLUCOPHAGE -XR) 500 MG 24 hr tablet Take 1 tablet (500 mg total) by mouth daily with breakfast. (Patient taking  differently: Take 1,000 mg by mouth daily with breakfast.) 30 tablet 0    Multiple Vitamin (MULTIVITAMIN WITH MINERALS) TABS tablet Take 1 tablet by mouth daily.      propranolol  ER (INDERAL  LA) 60 MG 24 hr capsule Take 1 capsule (60 mg total) by mouth daily. 30 capsule 0    QUEtiapine  (SEROQUEL ) 200 MG tablet Take 200 mg by mouth at bedtime.      traZODone  (DESYREL ) 100 MG tablet Take 1 tablet (100 mg total) by mouth at bedtime as needed for sleep. (Patient taking differently: Take 100-200 mg by mouth at bedtime.) 30 tablet 0     Patient Stressors: Traumatic event    Patient Strengths: Capable of independent living  Manufacturing systems engineer  Motivation for treatment/growth  Supportive family/friends   Treatment Modalities: Medication Management, Group therapy, Case management,  1 to 1 session with clinician, Psychoeducation, Recreational therapy.   Physician Treatment Plan for Primary Diagnosis:  Bipolar 1 disorder, mixed, moderate (HCC) Long Term Goal(s):     Short Term Goals:    Medication Management: Evaluate patient's response, side effects, and tolerance of medication regimen.  Therapeutic Interventions: 1 to 1 sessions, Unit Group sessions and Medication administration.  Evaluation of Outcomes: Progressing  Physician Treatment Plan for Secondary Diagnosis: Principal Problem:   Bipolar 1 disorder, mixed, moderate (HCC) Active Problems:   Obsessive compulsive disorder  Long Term Goal(s):     Short Term Goals:       Medication Management: Evaluate patient's response, side effects, and tolerance of medication regimen.  Therapeutic Interventions: 1 to 1 sessions, Unit Group sessions and Medication administration.  Evaluation of Outcomes: Progressing   RN Treatment Plan for Primary Diagnosis: Bipolar 1 disorder, mixed, moderate (HCC) Long Term Goal(s): Knowledge of disease and therapeutic regimen to maintain health will improve  Short Term Goals: Ability to remain free from injury will improve, Ability to verbalize frustration and anger appropriately will improve, Ability to verbalize feelings will improve, and Ability to disclose and discuss suicidal ideas  Medication Management: RN will administer medications as ordered by provider, will assess and evaluate patient's response and provide education to patient for prescribed medication. RN will report any adverse and/or side effects to prescribing provider.  Therapeutic Interventions: 1 on 1 counseling sessions, Psychoeducation, Medication administration, Evaluate responses to treatment, Monitor vital signs and CBGs as ordered, Perform/monitor CIWA, COWS, AIMS and Fall Risk screenings as ordered, Perform wound care treatments as ordered.  Evaluation of Outcomes: Progressing   LCSW Treatment Plan for Primary Diagnosis: Bipolar 1 disorder, mixed, moderate (HCC) Long Term Goal(s): Safe transition to appropriate next level  of care at discharge, Engage patient in therapeutic group addressing interpersonal concerns.  Short Term Goals: Engage patient in aftercare planning with referrals and resources, Increase ability to appropriately verbalize feelings, Facilitate acceptance of mental health diagnosis and concerns, and Identify triggers associated with mental health/substance abuse issues  Therapeutic Interventions: Assess for all discharge needs, 1 to 1 time with Social worker, Explore available resources and support systems, Assess for adequacy in community support network, Educate family and significant other(s) on suicide prevention, Complete Psychosocial Assessment, Interpersonal group therapy.  Evaluation of Outcomes: Progressing   Progress in Treatment: Attending groups: Yes. Participating in groups: Yes. Taking medication as prescribed: Yes. Toleration medication: Yes. Family/Significant other contact made: Yes. contacted:  Anthony Rindfleisch (husband) [phone]  Patient understands diagnosis: Yes. Discussing patient identified problems/goals with staff: Yes. Medical problems stabilized or resolved: Yes. Denies suicidal/homicidal ideation: No. HI and SI. HI  towards roommate and others on the unit  Issues/concerns per patient self-inventory: No.   New problem(s) identified: Yes, Describe:  pt requesting a room on [address], constant supervision or to spend time in the quiet room   New Short Term/Long Term Goal(s): medication stabilization, elimination of SI thoughts, development of comprehensive mental wellness plan.      Patient Goals:  Get out of the manic state, lessen the intrusive thoughts of wanting to choke out my roommate, and increase my sleep   Discharge Plan or Barriers: Patient recently admitted. CSW will continue to follow and assess for appropriate referrals and possible discharge planning.      Reason for Continuation of Hospitalization: Homicidal ideation Medication  stabilization Suicidal ideation Other; describe bipolar   Estimated Length of Stay: 4 - 6 days  Last 3 [place] Suicide Severity Risk Score: Flowsheet Row [facility] (Current[facility] Most recent reading at 07/04/2024 10:27 PM ED from 07/04/2024 in [facility] Most recent reading at 07/04/2024  3:43 PM Admission (Discharged) [facility] Most recent reading at 12/09/2023  3:30 PM  C-SSRS RISK CATEGORY Moderate Risk Moderate Risk No Risk    Last PHQ 2/9 Scores:    08/10/2022    7:45 PM  Depression screen PHQ 2/9  Decreased Interest 3  Down, Depressed, Hopeless 3  PHQ - 2 Score 6  Altered sleeping 3  Tired, decreased energy 1  Change in appetite 3  Feeling bad or failure about yourself  1  Trouble concentrating 2  Moving slowly or fidgety/restless 2  Suicidal thoughts 2  PHQ-9 Score 20  Difficult doing work/chores Very difficult    Scribe for Treatment Team: Carthel Castille O Challen Spainhour, LCSWA 07/10/2024 5:10 PM

## 2024-07-10 NOTE — Group Note (Signed)
 Date:  07/10/2024 Time:  9:23 PM  Group Topic/Focus:  Wrap-Up Group:   The focus of this group is to help patients review their daily goal of treatment and discuss progress on daily workbooks.    Participation Level:  Active  Participation Quality:  Appropriate  Affect:  Appropriate  Cognitive:  Appropriate  Insight: Appropriate  Engagement in Group:  Engaged  Modes of Intervention:  Education and Exploration  Additional Comments:  Patient attended and participated in group tonight.  She reports that today she went to her meals and went outside.  She talked wisth her doctor and her mother visited.  Gwenn Chillington Dacosta 07/10/2024, 9:23 PM

## 2024-07-10 NOTE — Group Note (Signed)
 Recreation Therapy Group Note   Group Topic:Communication  Group Date: 07/10/2024 Start Time: 1030 End Time: 1100 Facilitators: Raistlin Gum-McCall, LRT,CTRS Location: [address] Dayroom   Group Topic: Communication, Team Building, Problem Solving  Goal Area(s) Addresses:  Patient will effectively work with peer towards shared goal.  Patient will identify skills used to make activity successful.  Patient will identify how skills used during activity can be applied to reach post d/c goals.   Behavioral Response:   Intervention: STEM Activity- Glass Blower/designer  Activity: Tallest [employer]. In teams of 5-6, patients were given 11 craft pipe cleaners. Using the materials provided, patients were instructed to compete again the opposing team(s) to build the tallest free-standing structure from floor level. The activity was timed; difficulty increased by clinical research associate as production designer, theatre/television/film continued.  Systematically resources were removed with additional directions for example, placing one arm behind their back, working in silence, and shape stipulations. LRT facilitated post-activity discussion reviewing team processes and necessary communication skills involved in completion. Patients were encouraged to reflect how the skills utilized, or not utilized, in this activity can be incorporated to positively impact support systems post discharge.  Education: Pharmacist, Community, Scientist, Physiological, Discharge Planning   Education Outcome: Acknowledges education/In group clarification offered/Needs additional education.    Clinical Observations/Individualized Feedback: LRT didn't conduct group due to assisting with dayroom due to lack of staffing.      Plan: Continue to engage patient in RT group sessions 2-3x/week.   Kara Stephens, LRT,CTRS 07/10/2024 12:40 PM

## 2024-07-10 NOTE — Group Note (Deleted)
 Date:  07/10/2024 Time:  10:21 AM  Group Topic/Focus: Recreational Therapy Group   Pt did not attend recreational therapy group   Kara Stephens 07/10/2024, 10:21 AM

## 2024-07-10 NOTE — Progress Notes (Signed)
 [facility] MD Progress Note  07/10/2024 7:31 AM Kara Stephens  MRN:  [id number]  Principal Problem: Bipolar 1 disorder, mixed, moderate (HCC) Diagnosis: Principal Problem:   Bipolar 1 disorder, mixed, moderate (HCC) Active Problems:   Obsessive compulsive disorder  Total Time spent with patient: 35 minutes   Identifying Information and Past Psychiatric History:  The patient is a 38 y.o. female (domiciled with husband and children,) with a medical history of pre-diabetes and a psychiatric history most consistent with bipolar 1 disorder, OCD and cluster B traits, currently admitted due to high anxiety related to intrusive thoughts of suicide (without actual SI).   Psychiatric history notable for prior bipolar II diagnosis with approximately 5 years of stability on lamotrigine . Symptoms reoccurred with worsening severity after the birth of her second child at age 89. Since then she has had at least 2 credible manic symptoms with psychosis. Symptoms have included racing thoughts, reduced sleep, impulsivity, pressured speech and psychomotor agitation and have included psychotic symptoms such as hallucinations. In addition to elevated mood states, the patient has had recurrent depressive symptoms characterized by low mood, anhedonia, irritability, low energy and negative intrusive thoughts. She has had 4 hospitalizations in the last 2-3 years for depression and mania: March 2025 for mania, February 2025 for mania, December 2023 bipolar mixed episode with catatonic features and OCD. She was most recently admitted from 12/09/23-12/14/23 at [facility] self-admitted largely for anxiety symptoms, feeling that her mania was not well controlled. She has no prior suicide attempts. She has been on numerous medications for management of mood symptoms as noted below with numerous side effects/intolerances or poor effects. She does present with instability in relationships, poor coping, recurrent SI, affective lability concerning for  cluster B traits confounding mood symptoms. Patient has no prior suicide attempts. Currently follows outpatient with Dr. Chyrl Na for med management and she sees Velinda Beat for therapy.   Interval events: The patient was documented to be anxious, demonstrating slow speech, otherwise mental status unremarkable. Attending some groups. Medication compliant.  Took both doses of available klonopin  yesterday. Staff recording 7.5 hrs of sleep overnight, patient voicing 3 hrs total. BP (!) 127/91 (BP Location: Right Arm)   Pulse 97   Temp 98.8 F (37.1 C) (Oral)   Resp 17   Ht 5' 6 (1.676 m)   Wt 106.7 kg   SpO2 97%   BMI 37.96 kg/m     Interview today: Today the patient states she is down because of the situation. She does not feel that there has been any improvements in her intrusive thoughts, and that they may be even stronger. Continues to deny an actual suicidal ideation, homicidal ideation, intent or plan. Continues to voice fears that she will be released from the hospital too early and something bad will happen. Discussed again ego-dystonic intrusive thoughts as being very uncomfortable but unlikely to act on them given strong desire against harming herself or her son. Patient is not sure if stopping daytime seroquel  worsened intrusive thoughts or if it is due to clompiramine. Discussed with her wait/watch strategy with the clomipramine , as it is likely to take some time to fully have effect and she voiced understanding. This author continued to recommend behavioral strategies for reducing distress around intrusive thoughts. Patient states she slept slightly better with klonopin  at night. Thinks she got about 3 hrs. Continues to deny actual SI, HI and AVH despite frequent and distressing intrusive thoughts about killing herself or her son.  Collateral: Husband, Anthony Duff [phone]  -Contacted on 07/08/24 for further information and to provide update. See that day's  progress note for details. In brief, husband did not see signs/symptoms of mania in her this time around (typically there are warning signs) and does not think she is manic. Is not sure what happened - she suddenly started having these thoughts and said she needed to go to the hospital. They are under more stress. Only recent med change was an increase in metformin  - before that other medications were working well to manage her bipolar.   Past Medical History:  Past Medical History:  Diagnosis Date   Anxiety    Back spasm    Bipolar I disorder (HCC)    Borderline personality disorder (HCC)    Class 3 obesity (HCC)    Cluster B personality disorder (HCC)    Hypercholesterolemia    Hypertension    Hypertriglyceridemia    Somatic symptom disorder    Tachycardia    Type 2 diabetes mellitus (HCC)    Vitamin D  insufficiency     Past Surgical History:  Procedure Laterality Date   APPENDECTOMY     CHOLECYSTECTOMY     HAND SURGERY     SHOULDER SURGERY     Family History:  Family History  Problem Relation Age of Onset   Hypertension Mother    Hyperlipidemia Mother    Suicidality Father    Depression Father    Social History:  Social History   Substance and Sexual Activity  Alcohol Use No     Social History   Substance and Sexual Activity  Drug Use No    Social History   Socioeconomic History   Marital status: Married    Spouse name: Not on file   Number of children: Not on file   Years of education: Not on file   Highest education level: Not on file  Occupational History   Not on file  Tobacco Use   Smoking status: Every Day    Current packs/day: 0.50    Average packs/day: 0.5 packs/day for 5.7 years (2.8 ttl pk-yrs)    Types: Cigarettes    Start date: 11/2018    Last attempt to quit: 09/10/2016   Smokeless tobacco: Never  Vaping Use   Vaping status: Never Used  Substance and Sexual Activity   Alcohol use: No   Drug use: No   Sexual activity: Not on file   Other Topics Concern   Not on file  Social History Narrative   Not on file   Social Drivers of Health   Financial Resource Strain: Not on file  Food Insecurity: Food Insecurity Present (07/04/2024)   Hunger Vital Sign    Worried About Running Out of Food in the Last Year: Sometimes true    Ran Out of Food in the Last Year: Never true  Transportation Needs: No Transportation Needs (07/04/2024)   PRAPARE - Administrator, Civil Service (Medical): No    Lack of Transportation (Non-Medical): No  Physical Activity: Not on file  Stress: Stress Concern Present (11/16/2023)   Received from [facility] - Occupational Stress Questionnaire    Feeling of Stress : Very much  Social Connections: Unknown (01/10/2022)   Received from [facility]   Social Network    Social Network: Not on file    Current Medications: Current Facility-Administered Medications  Medication Dose Route Frequency Provider Last Rate Last Admin  acetaminophen  (TYLENOL ) tablet 650 mg  650 mg Oral Q6H PRN McLauchlin, Angela, NP   650 mg at 07/08/24 1818   alum & mag hydroxide-simeth (MAALOX/MYLANTA) 200-200-20 MG/5ML suspension 30 mL  30 mL Oral Q4H PRN McLauchlin, Angela, NP       clomiPRAMINE  (ANAFRANIL ) capsule 25 mg  25 mg Oral BID Gavino Fouch N, MD   25 mg at 07/09/24 1806   clonazePAM  (KLONOPIN ) disintegrating tablet 0.5 mg  0.5 mg Oral QHS Towana Leita SAILOR, MD   0.5 mg at 07/09/24 2047   clonazePAM  (KLONOPIN ) tablet 0.5 mg  0.5 mg Oral BID PRN Lorrie Strauch N, MD   0.5 mg at 07/09/24 1608   haloperidol  (HALDOL ) tablet 5 mg  5 mg Oral Q6H PRN Towana Leita SAILOR, MD       Or   haloperidol  lactate (HALDOL ) injection 5 mg  5 mg Intramuscular Q6H PRN Carl Bleecker N, MD   5 mg at 07/06/24 1508   lamoTRIgine  (LAMICTAL ) tablet 250 mg  250 mg Oral Daily Towana Leita SAILOR, MD   250 mg at 07/09/24 9187   magnesium  hydroxide (MILK OF MAGNESIA) suspension 30 mL  30 mL Oral  Daily PRN McLauchlin, Angela, NP       melatonin tablet 3 mg  3 mg Oral QHS Towana Leita SAILOR, MD   3 mg at 07/09/24 2047   memantine  (NAMENDA ) tablet 10 mg  10 mg Oral BID Deriana Vanderhoef N, MD   10 mg at 07/09/24 1805   metFORMIN  (GLUCOPHAGE ) tablet 500 mg  500 mg Oral Q breakfast Towana Leita SAILOR, MD   500 mg at 07/09/24 9187   propranolol  ER (INDERAL  LA) 24 hr capsule 60 mg  60 mg Oral Daily Katurah Karapetian N, MD   60 mg at 07/09/24 0900   QUEtiapine  (SEROQUEL  XR) 24 hr tablet 300 mg  300 mg Oral QHS Makeisha Jentsch N, MD   300 mg at 07/09/24 2046   traZODone  (DESYREL ) tablet 50 mg  50 mg Oral QHS Lashea Goda N, MD   50 mg at 07/09/24 2047    Lab Results:  No results found for this or any previous visit (from the past 48 hours).   Blood Alcohol level:  Lab Results  Component Value Date   [facility] <15 07/04/2024   ETH <10 08/10/2022    Metabolic Disorder Labs: Lab Results  Component Value Date   HGBA1C 5.4 07/04/2024   MPG 108.28 07/04/2024   MPG 119.76 12/07/2023   No results found for: PROLACTIN Lab Results  Component Value Date   CHOL 223 (H) 07/04/2024   TRIG 100 07/04/2024   HDL 53 07/04/2024   CHOLHDL 4.2 07/04/2024   VLDL 20 07/04/2024   LDLCALC 150 (H) 07/04/2024   LDLCALC 92 12/07/2023    Physical Findings:  Mental Status exam: Appearance: white female of slightly elevated BMI, appropriately groomed in casual clothing, seen sitting in her room Eye contact: good  Attitude towards examiner: overall cooperative, although perseverates on certain topics (having mania, having intrusive thoughts, fears of being discharged) Psychomotor: no agitation or retardation  Speech: normal in prosody; slow and meandering; not pressured  Language: no delays  Mood: down because of the situation  Affect: congruent, blunted and fatigued  Thought content: denying SI and HI (reporting intrusive thoughts of harming self and throwing son into a pool but no desire or intent to act on  thoughts), no delusions expressed  Thought Process: linear, organized and goal-directed  Perception: denying  AVH, not RTIS  Insight: fair  Judgement: fair    Orientation: x3 Attention/Concentration: good - attends to interview  Memory/Cognition: grossly intact   Fund of Knowledge: Average      Musculoskeletal: Strength & Muscle Tone: within normal limits Gait & Station: normal Patient leans: N/A    Physical Exam Constitutional:      Appearance: Normal appearance.  HENT:     Head: Normocephalic and atraumatic.  Pulmonary:     Effort: Pulmonary effort is normal.  Abdominal:     General: There is no distension.  Musculoskeletal:        General: Normal range of motion.     Cervical back: Normal range of motion.  Neurological:     General: No focal deficit present.     Mental Status: She is alert.    Review of Systems  All other systems reviewed and are negative.  Blood pressure (!) 127/91, pulse 97, temperature 98.8 F (37.1 C), temperature source Oral, resp. rate 17, height 5' 6 (1.676 m), weight 106.7 kg, SpO2 97%. Body mass index is 37.96 kg/m.   Treatment Plan Summary: Daily contact with patient to assess and evaluate symptoms and progress in treatment  Assessment: The patient is a 38 y.o. female with a psychiatric history most consistent with bipolar 1 disorder, current episode mixed and cluster B traits. She had carried a bipolar II diagnosis through much of adult life with worsening of symptoms occurring post-partum at age 72. Since then she has had 4 psychiatric admissions, 2 for credible manic episodes with psychosis. She has additionally presented with recurrent depressive episodes, although no suicide attempts. Mood symptoms are confounded by cluster B traits with clear history of unstable relationships, affective lability, poor coping, recurrent SI. On this admission the patient presents largely with concerns related to high anxiety and intrusive thoughts.  Although reporting concerns for mania, aside from mildly reduced sleep (5 hrs) and rapidly cycling thoughts she does not present with endorsed active manic symptoms and does not present with observable signs of mania.  Given this admission was precipitated by several recent psychosocial stressors, personality pathology may be contributing to worsening of symptoms  to some degree. Regardless, she has endorsed significant distress with current intrusive thoughts of harm to self/others, overall high anxiety and urges to count to reduce this more consistent with an OCD picture.     To address high anxiety/distress related to voices Seroquel  IR 12.5 BID was initiated 10/29 and will increased to 25 mg as of lunch dose 10/30. Ultimately discontinued 11/1 due to reports of emotional numbing and no significant improvements in anxiety or intrusive thoughts. Instead, evening seroquel  was increased to 300 mg to consolidate dose and to help address subjective poor sleep. Given poor response to daytime seroquel  and hydroxyzine , PRN klonopin  0.5 mg BID was started to be reserved for severe anxiety only - patient notified that she would not get a script for this upon discharge and voiced understanding.  Lamotrigine  was increased to 250 mg (morning of 11/1) to address any underlying hypomanic symptoms and to prevent any manic symptoms from occurring given plan to address OCD with clomipramine . This was started 11/1 at 25 mg BID as she remained with no significant changes in intrusive thoughts of harming herself or throwing son into a pool. Discussed with patient risk of worsening manic symptoms and to keep an eye on it, however she has not demonstrated observable signs of mania since admission (appears fatigued, speech is slow, no signs  of psychosis, etc.).   Today the patient continued to report subjective poor sleep with minimal improvements (staff documenting approx. 7 hrs hrs, patient reporting 3 hrs) and no change in  intrusive thoughts. Continues to deny actual SI, HI or AVH but distressing intrusive thoughts of suicide and throwing 21-year-old into a pool. Will increase clomipramine  to 50 mg BID for this afternoon's dose and continue to monitor.     DSM-5 diagnoses: Bipolar I disorder, current episode mixed, no current psychotic features  Obsessive-Compulsive disorder  Cluster B traits              -r/o BPD   Plan:   Legal Status: -voluntary    Safety -q15 minute checks  -elopement, suicide and assault precautions  -daily vitals   Psychiatric Concerns  -Continue to Lamotrigine  250 mg daily for mood  -Continue Namenda  10 mg BID for reported intrusive thoughts (patient takes at home and reports benefit) -Continue Trazodone  50 mg at bedtime for sleep (reduced dose in case of paradoxical worsening of sleep) -Continue Seroquel  XR to 300 mg at bedtime for sleep and mood stabilization  -Increase Clomipramine  to 50 mg BID (started evening 11/1 for intrusive thoughts related to OCD -Continue Klonopin  0.5 mg BID PRN for anxiety + scheduled 0.5 mg at bedtime for sleep   -patient notified klonopin  will not be continued after hospital and to reserve for severe anxiety only  -Continue PRN haldol  5 mg PO or IM q6h for agitation   -Discontinued PRN hydroxyzine  (reports worsening of anxiety) -Discontinue BID seroquel  12.5 and then 25 due to no improvement in anxiety or intrusive thoughts and reports of emotional numbing     Substance use concerns  -none currently    Nicotine  Replacement  N/A    Medical concerns Pre-diabetes -Current A1c WNL (5.4) but was elevated 7 months ago prior to beginning metformin  -Continue metformin  500 mg daily      Additional PRNs: -Tylenol  tablets 650 mg every 6 hours as needed for pain -Maalox/Mylanta suspension 30 mL every 4 hours as needed for indigestion  -Milk of Magnesia 30 mL daily as needed for constipation   Labs -Reviewed as documented in HPI    Psychosocial interventions  -daily medication management with psychiatry -Medication education regarding risks/benefits and alternatives -bedside psychotherapy as indicated  -Patient will be encouraged to participate and engage with group therapy  -Appreciate SW assistance in coordinating safe disposition    Leita LOISE Arts, MD 07/10/2024, 7:31 AM

## 2024-07-10 NOTE — Group Note (Signed)
 LCSW Group Therapy Note   Group Date: 07/10/2024 Start Time: 1300 End Time: 1400   Participation:  patient was present.  She listened and was respectful but didn't participate in the discussion.   Type of Therapy:  Group Therapy  Topic:  Speaking from the Heart: Communicating with Understanding and Empathy  Objective:  To help participants develop effective communication skills to express themselves clearly, listen actively, and navigate conflicts in a healthy way.  Goals: - Increase awareness of verbal and non-verbal communication skills. - Practice using "I" statements and active listening techniques. - Learn coping strategies for managing communication stress.  Summary:  Participants explored the importance of communication, discussed challenges, and practiced skills such as active listening and assertive expression. They reflected on past experiences and identified ways to improve communication in their daily lives.  Therapeutic Modalities: - Cognitive-Behavioral Therapy (CBT): Restructuring negative thought patterns in communication. - Mindfulness: Staying present and calm during conversations. - Psycho-education:  Learning about effective communication techniques.   Jamere Stidham O Adrienne Trombetta, LCSWA 07/10/2024  4:36 PM

## 2024-07-10 NOTE — Plan of Care (Signed)
 ?  Problem: Education: ?Goal: Mental status will improve ?Outcome: Progressing ?Goal: Verbalization of understanding the information provided will improve ?Outcome: Progressing ?  ?

## 2024-07-10 NOTE — Progress Notes (Signed)
 [facility] MD Progress Note  07/11/2024 2:28 PM Kara Stephens  MRN:  [id number] Subjective:   Kara Stephens is a 38 yr old female who presented on 10/28 to [facility] due to intrusive Suicidal Thoughts and Homicidal Thoughts, she was admitted to [facility] on 10/29.  PPHx is significant for Bipolar Disorder, OCD, and Cluster B traits, and 4 Prior Psychiatric Hospitalizations (last [facility] 12/2023), and no history of Suicide Attempts.   Case was discussed in the multidisciplinary team. MAR was reviewed and patient was compliant with medications.  She received PRN Klonopin  yesterday.   Psychiatric Team made the following recommendations yesterday: -Increase Clomipramine  to 50 mg BID for OCD  -Continue Lamictal  250 mg daily for mood stability -Continue Seroquel  XR 300 mg QHS for mood stability and sleep -Continue Namenda  10 mg BID for reported intrusive thoughts (patient takes at home and reports benefit)  -Continue Trazodone  50 mg QHS    On interview today patient reports she slept poor last night (per staff slept 8.25 hours).  She reports her appetite is doing fair.  She reports no SI or AVH.  She reports she continues to have ego-dystonic intrusive thoughts of throwing her son in the pool.  She reports no Paranoia or Ideas of Reference.  She reports no issues with her medications.  Discussed with her to continue pushing back against these intrusive thoughts and that medications do take time.  She reported understanding but continued distress over these thoughts.  Provided reassurance that while on the unit she is safe and that her son is safe and she was thankful.  She reported some mild weakness first thing in the morning that resolved but otherwise reports no other concerns at present.   Principal Problem: Bipolar 1 disorder, mixed, moderate (HCC) Diagnosis: Principal Problem:   Bipolar 1 disorder, mixed, moderate (HCC) Active Problems:   Obsessive compulsive disorder  Total Time spent with patient:  I  personally spent 35 minutes on the unit in direct patient care. The direct patient care time included face-to-face time with the patient, reviewing the patient's chart, communicating with other professionals, and coordinating care.    Past Psychiatric History:  Bipolar Disorder, OCD, and Cluster B traits, and 4 Prior Psychiatric Hospitalizations (last [facility] 12/2023), and no history of Suicide Attempts.  Past Medical History:  Past Medical History:  Diagnosis Date   Anxiety    Back spasm    Bipolar I disorder (HCC)    Borderline personality disorder (HCC)    Class 3 obesity (HCC)    Cluster B personality disorder (HCC)    Hypercholesterolemia    Hypertension    Hypertriglyceridemia    Somatic symptom disorder    Tachycardia    Type 2 diabetes mellitus (HCC)    Vitamin D  insufficiency     Past Surgical History:  Procedure Laterality Date   APPENDECTOMY     CHOLECYSTECTOMY     HAND SURGERY     SHOULDER SURGERY     Family History:  Family History  Problem Relation Age of Onset   Hypertension Mother    Hyperlipidemia Mother    Suicidality Father    Depression Father    Family Psychiatric  History:  Father- Depression, Suicidality   Social History:  Social History   Substance and Sexual Activity  Alcohol Use No     Social History   Substance and Sexual Activity  Drug Use No    Social History   Socioeconomic History   Marital status: Married  Spouse name: Not on file   Number of children: Not on file   Years of education: Not on file   Highest education level: Not on file  Occupational History   Not on file  Tobacco Use   Smoking status: Every Day    Current packs/day: 0.50    Average packs/day: 0.5 packs/day for 5.7 years (2.8 ttl pk-yrs)    Types: Cigarettes    Start date: 11/2018    Last attempt to quit: 09/10/2016   Smokeless tobacco: Never  Vaping Use   Vaping status: Never Used  Substance and Sexual Activity   Alcohol use: No   Drug use: No    Sexual activity: Not on file  Other Topics Concern   Not on file  Social History Narrative   Not on file   Social Drivers of Health   Financial Resource Strain: Not on file  Food Insecurity: Food Insecurity Present (07/04/2024)   Hunger Vital Sign    Worried About Running Out of Food in the Last Year: Sometimes true    Ran Out of Food in the Last Year: Never true  Transportation Needs: No Transportation Needs (07/04/2024)   PRAPARE - Administrator, Civil Service (Medical): No    Lack of Transportation (Non-Medical): No  Physical Activity: Not on file  Stress: Stress Concern Present (11/16/2023)   Received from [facility] of Occupational Health - Occupational Stress Questionnaire    Feeling of Stress : Very much  Social Connections: Unknown (01/10/2022)   Received from [facility]   Social Network    Social Network: Not on file   Additional Social History:                         Sleep: Good per staff but patient reports poor Estimated Sleeping Duration (Last 24 Hours): 6.25-8.00 hours (Due to [employer] Time, the durations displayed may not accurately represent documentation during the time change interval)  Appetite:  Fair  Current Medications: Current Facility-Administered Medications  Medication Dose Route Frequency Provider Last Rate Last Admin   acetaminophen  (TYLENOL ) tablet 650 mg  650 mg Oral Q6H PRN McLauchlin, Angela, NP   650 mg at 07/08/24 1818   alum & mag hydroxide-simeth (MAALOX/MYLANTA) 200-200-20 MG/5ML suspension 30 mL  30 mL Oral Q4H PRN McLauchlin, Angela, NP       clomiPRAMINE  (ANAFRANIL ) capsule 50 mg  50 mg Oral BID Towana Leita SAILOR, MD   50 mg at 07/11/24 9180   clonazePAM  (KLONOPIN ) disintegrating tablet 0.5 mg  0.5 mg Oral QHS Towana Leita SAILOR, MD   0.5 mg at 07/10/24 2033   clonazePAM  (KLONOPIN ) tablet 0.5 mg  0.5 mg Oral BID PRN Towana Leita SAILOR, MD   0.5 mg at 07/10/24 1414   haloperidol  (HALDOL )  tablet 5 mg  5 mg Oral Q6H PRN Towana Leita SAILOR, MD       Or   haloperidol  lactate (HALDOL ) injection 5 mg  5 mg Intramuscular Q6H PRN Towana Leita SAILOR, MD   5 mg at 07/06/24 1508   lamoTRIgine  (LAMICTAL ) tablet 250 mg  250 mg Oral Daily Towana Leita SAILOR, MD   250 mg at 07/11/24 0818   magnesium  hydroxide (MILK OF MAGNESIA) suspension 30 mL  30 mL Oral Daily PRN McLauchlin, Angela, NP       melatonin tablet 3 mg  3 mg Oral QHS Towana Leita SAILOR, MD   3 mg at  07/10/24 2032   memantine  (NAMENDA ) tablet 10 mg  10 mg Oral BID Butler, Laura N, MD   10 mg at 07/11/24 9180   metFORMIN  (GLUCOPHAGE ) tablet 500 mg  500 mg Oral Q breakfast Towana Leita SAILOR, MD   500 mg at 07/11/24 9180   propranolol  ER (INDERAL  LA) 24 hr capsule 60 mg  60 mg Oral Daily Butler, Laura N, MD   60 mg at 07/11/24 9180   QUEtiapine  (SEROQUEL  XR) 24 hr tablet 300 mg  300 mg Oral QHS Butler, Laura N, MD   300 mg at 07/10/24 2032   traZODone  (DESYREL ) tablet 50 mg  50 mg Oral QHS Butler, Laura N, MD   50 mg at 07/10/24 2031    Lab Results: No results found for this or any previous visit (from the past 48 hours).  Blood Alcohol level:  Lab Results  Component Value Date   [facility] <15 07/04/2024   ETH <10 08/10/2022    Metabolic Disorder Labs: Lab Results  Component Value Date   HGBA1C 5.4 07/04/2024   MPG 108.28 07/04/2024   MPG 119.76 12/07/2023   No results found for: PROLACTIN Lab Results  Component Value Date   CHOL 223 (H) 07/04/2024   TRIG 100 07/04/2024   HDL 53 07/04/2024   CHOLHDL 4.2 07/04/2024   VLDL 20 07/04/2024   LDLCALC 150 (H) 07/04/2024   LDLCALC 92 12/07/2023    Physical Findings: AIMS:  ,  ,  ,  ,  ,  ,   CIWA:    COWS:     Musculoskeletal: Strength & Muscle Tone: within normal limits Gait & Station: normal Patient leans: N/A  Psychiatric Specialty Exam:  Presentation  General Appearance:  Casual; Appropriate for Environment  Eye Contact: Fair  Speech: Clear and Coherent; Normal  Rate  Speech Volume: Normal  Handedness: Right   Mood and Affect  Mood: Anxious; Dysphoric  Affect: Congruent   Thought Process  Thought Processes: Coherent  Descriptions of Associations:Intact  Orientation:Full (Time, Place and Person)  Thought Content:WDL  History of Schizophrenia/Schizoaffective disorder:No  Duration of Psychotic Symptoms:N/A  Hallucinations:Hallucinations: None  Ideas of Reference:None  Suicidal Thoughts:Suicidal Thoughts: No  Homicidal Thoughts:Homicidal Thoughts: -- (Continues to have ego-dystonic intrusive thoughts)   Sensorium  Memory: Immediate Fair  Judgment: Intact  Insight: Present   Executive Functions  Concentration: Fair  Attention Span: Fair  Recall: [employer] of Knowledge: Fair  Language: Fair   Psychomotor Activity  Psychomotor Activity:Psychomotor Activity: Normal   Assets  Assets: Resilience; Communication Skills   Sleep  Sleep:Sleep: -- (reports poor)    Physical Exam: Physical Exam Vitals and nursing note reviewed.  Constitutional:      General: She is not in acute distress.    Appearance: Normal appearance. She is obese. She is not ill-appearing or toxic-appearing.  HENT:     Head: Normocephalic and atraumatic.  Pulmonary:     Effort: Pulmonary effort is normal.  Musculoskeletal:        General: Normal range of motion.  Neurological:     General: No focal deficit present.     Mental Status: She is alert.    Review of Systems  Respiratory:  Negative for cough and shortness of breath.   Cardiovascular:  Negative for chest pain.  Gastrointestinal:  Negative for abdominal pain, constipation, diarrhea, nausea and vomiting.  Neurological:  Positive for weakness (mild in the AM). Negative for dizziness and headaches.  Psychiatric/Behavioral:  Positive for depression. Negative for  hallucinations and suicidal ideas. The patient is nervous/anxious.    Blood pressure 122/86, pulse  92, temperature 99 F (37.2 C), temperature source Oral, resp. rate 18, height 5' 6 (1.676 m), weight 106.7 kg, SpO2 98%. Body mass index is 37.96 kg/m.   Treatment Plan Summary: Daily contact with patient to assess and evaluate symptoms and progress in treatment and Medication management  ANAYA BOVEE is a 38 yr old female who presented on 10/28 to [facility] due to intrusive Suicidal Thoughts and Homicidal Thoughts, she was admitted to [facility] on 10/29.  PPHx is significant for Bipolar Disorder, OCD, and Cluster B traits, and 4 Prior Psychiatric Hospitalizations (last [facility] 12/2023), and no history of Suicide Attempts.    Itzayanna continues to report ego-dystonic intrusive thoughts about her son.  Encouraged her to continue pushing back against those thoughts while giving her medication time.  We will not make any changes to her medications at this time.  We will continue to monitor.   Bipolar I disorder, current episode mixed, no current psychotic features  OCD : -Continue Clomipramine  50 mg BID for OCD -Continue Lamictal  250 mg daily for mood stability -Continue Seroquel  XR 300 mg QHS for mood stability and sleep -Continue Namenda  10 mg BID for reported intrusive thoughts (patient takes at home and reports benefit)  -Continue Trazodone  50 mg QHS -Continue Klonopin  0.5 mg BID PRN for anxiety + scheduled 0.5 mg at bedtime for sleep              -patient notified klonopin  will not be continued after hospital and to reserve for severe anxiety only -Continue Agitation Protocol: Haldol /Ativan /Benadryl    -Continue Metformin  500 mg daily -Continue Propanolol ER 60 mg daily -Continue Melatonin 3 mg QHS -Continue PRN's: Tylenol , Maalox, Milk of Magnesia   --  The risks/benefits/side-effects/alternatives to medications were discussed in detail with the patient and time was given for questions. The patient consents to medication trials.                -- Metabolic profile and EKG monitoring  obtained while on an atypical antipsychotic (BMI: 37.96  Lipid Panel: WNL except Chol: 223, LDL: 150 HbgA1c: 5.4  EKG: Sinus Rhythm w/ PVC w/QTc: 450)              -- Encouraged patient to participate in unit milieu and in scheduled group therapies              -- Short Term Goals: Ability to identify changes in lifestyle to reduce recurrence of condition will improve, Ability to verbalize feelings will improve, Ability to disclose and discuss suicidal ideas, Ability to demonstrate self-control will improve, Ability to identify and develop effective coping behaviors will improve, Ability to maintain clinical measurements within normal limits will improve, Compliance with prescribed medications will improve, and Ability to identify triggers associated with substance abuse/mental health issues will improve             -- Long Term Goals: Improvement in symptoms so as ready for discharge   Safety and Monitoring:             -- Voluntary admission to inpatient psychiatric unit for safety, stabilization and treatment             -- Daily contact with patient to assess and evaluate symptoms and progress in treatment             -- Patient's case to be discussed in multi-disciplinary team meeting             --  Observation Level : q15 minute checks             -- Vital signs:  q12 hours             -- Precautions: suicide, elopement, and assault  Discharge Planning:              -- Social work and case management to assist with discharge planning and identification of hospital follow-up needs prior to discharge             -- Estimated LOS: 3-5 more days             -- Discharge Concerns: Need to establish a safety plan; Medication compliance and effectiveness             -- Discharge Goals: Return home with outpatient referrals for mental health follow-up including medication management/psychotherapy    Marsa GORMAN Rosser, DO 07/11/2024, 2:28 PM

## 2024-07-10 NOTE — Progress Notes (Signed)
 Kara Stephens requested to meet following group.  She shared her struggles with the thoughts she has been having, including thoughts of harming roommate.  I provided a safe space for her to explore her understanding of the thoughts as well as the grief, guilt and shame she feels at having them.  I accompanied her as she was moved from [address] [employer] and provided prayer at her request.

## 2024-07-11 DIAGNOSIS — F429 Obsessive-compulsive disorder, unspecified: Secondary | ICD-10-CM | POA: Diagnosis not present

## 2024-07-11 DIAGNOSIS — F3162 Bipolar disorder, current episode mixed, moderate: Secondary | ICD-10-CM | POA: Diagnosis not present

## 2024-07-11 NOTE — Plan of Care (Signed)
   Problem: Education: Goal: Emotional status will improve Outcome: Progressing Goal: Mental status will improve Outcome: Progressing Goal: Verbalization of understanding the information provided will improve Outcome: Progressing

## 2024-07-11 NOTE — Group Note (Deleted)
 Date:  07/11/2024 Time:  8:10 PM  Group Topic/Focus:  Wrap-Up Group:   The focus of this group is to help patients review their daily goal of treatment and discuss progress on daily workbooks.     Participation Level:  {BHH PARTICIPATION OZCZO:77735}  Participation Quality:  {BHH PARTICIPATION QUALITY:22265}  Affect:  {BHH AFFECT:22266}  Cognitive:  {BHH COGNITIVE:22267}  Insight: {BHH Insight2:20797}  Engagement in Group:  {BHH ENGAGEMENT IN HMNLE:77731}  Modes of Intervention:  {BHH MODES OF INTERVENTION:22269}  Additional Comments:  ***  Kara Stephens 07/11/2024, 8:10 PM

## 2024-07-11 NOTE — BHH Group Notes (Signed)
 Adult Psychoeducational Group Note  Date:  07/11/2024 Time:  9:01 PM  Group Topic/Focus:  Wrap-Up Group:   The focus of this group is to help patients review their daily goal of treatment and discuss progress on daily workbooks.  Participation Level:  Active  Participation Quality:  Appropriate  Affect:  Appropriate  Cognitive:  Appropriate  Insight: Appropriate  Engagement in Group:  Engaged  Modes of Intervention:  Discussion  Additional Comments:  Pt told that today was an okay day on the unit, the highlight of which was a visit from her daughter. On the subject of wellness, [name] told that, when well, she's more goofy and spends more time with her family. Pt rated her day a 2 out of 10.  Kara Stephens 07/11/2024, 9:01 PM

## 2024-07-11 NOTE — BHH Group Notes (Signed)
 Adult Psychoeducational Group Note  Date:  07/11/2024 Time:  2:48 PM  Group Topic/Focus: Pharmacy Group   Participation Level:  Active  Participation Quality:  Attentive  Affect:  Appropriate  Cognitive:  Appropriate  Insight: Good  Engagement in Group:  Engaged  Modes of Intervention:  Discussion  Additional Comments:    [name] 07/11/2024, 2:48 PM

## 2024-07-11 NOTE — BHH Group Notes (Signed)
 Adult Psychoeducational Group Note  Date:  07/11/2024 Time:  4:03 PM  Group Topic/Focus: Recreational Therapy Group    Participation Level:  Did Not Attend  Participation Quality:    Affect:    Cognitive:    Insight:   Engagement in Group:    Modes of Intervention:    Additional Comments:    [name] 07/11/2024, 4:03 PM

## 2024-07-11 NOTE — Group Note (Signed)
 Recreation Therapy Group Note   Group Topic:Health and Wellness  Group Date: 07/11/2024 Start Time: 1055 End Time: 1110 Facilitators: Kyair Ditommaso-McCall, LRT,CTRS Location: [address] Dayroom   Group Topic: Exercise/Wellness  Goal Area(s) Addresses:  Patient will actively participate in exercises selected by peers.  Patient will verbalize benefit of exercise during group session. Patient will identify an exercise that can be completed post d/c. Patient will acknowledge benefits of exercise when used as a coping mechanism.   Behavioral Response: Engaged  Intervention: Music  Activity: Exercise. LRT and patients took turns leading the group in the exercises of their choosing. The did two rounds of exercises. Patients were encouraged to modify exercise or skip it if they were unable to complete it. Patients were also encouraged to get water if they needed it.    Education: Physical Activity, Health and Wellness  Education Outcome: Acknowledges understanding/In group clarification offered/Needs additional education.    Affect/Mood: Appropriate   Participation Level: Engaged   Participation Quality: Independent   Behavior: Appropriate   Speech/Thought Process: Barely audible    Insight: Good   Judgement: Good   Modes of Intervention: Music   Patient Response to Interventions:  Engaged   Education Outcome:  In group clarification offered    Clinical Observations/Individualized Feedback: Pt seemed a little reserved but fully engaged in activity. Pt was soft spoken but led group in jumping jacks and some stretching exercises. Pt was appropriate and active during group.     Plan: Continue to engage patient in RT group sessions 2-3x/week.   Judi Jaffe-McCall, LRT,CTRS  07/11/2024 3:39 PM

## 2024-07-11 NOTE — BHH Group Notes (Signed)
 Adult Psychoeducational Group Note  Date:  07/11/2024 Time:  10:13 AM  Group Topic/Focus:  Goals Group:   The focus of this group is to help patients establish daily goals to achieve during treatment and discuss how the patient can incorporate goal setting into their daily lives to aide in recovery.  Participation Level:  Active  Participation Quality:  Attentive  Affect:  Appropriate  Cognitive:  Appropriate  Insight: Appropriate  Engagement in Group:  Engaged  Modes of Intervention:  Discussion  Additional Comments:  Pt. Stated that she need to take control of her thoughts.   Kara Stephens 07/11/2024, 10:13 AM

## 2024-07-11 NOTE — Progress Notes (Addendum)
 Collateral contact - [facility].  Phone:  [phone]  CSW was informed that the referral was received yesterday.  They have a few other referrals to review.  They don't visit patients in hospitals but at homes.    Phallon Haydu, LCSWA 07/11/2024

## 2024-07-12 DIAGNOSIS — F3162 Bipolar disorder, current episode mixed, moderate: Secondary | ICD-10-CM | POA: Diagnosis not present

## 2024-07-12 DIAGNOSIS — F429 Obsessive-compulsive disorder, unspecified: Secondary | ICD-10-CM | POA: Diagnosis not present

## 2024-07-12 NOTE — Group Note (Signed)
 Recreation Therapy Group Note   Group Topic:Communication  Group Date: 07/12/2024 Start Time: 1035 End Time: 1100 Facilitators: Ciera Beckum-McCall, LRT,CTRS Location: [address] Dayroom   Group Topic: Communication, Problem Solving   Goal Area(s) Addresses:  Patient will effectively listen to complete activity.  Patient will identify communication skills used to make activity successful.  Patient will identify how skills used during activity can be used to reach post d/c goals.    Behavioral Response:    Intervention: Building Surveyor Activity - Geometric pattern cards, pencils, blank paper    Activity: Geometric Drawings.  Three volunteers from the peer group will be shown an abstract picture with a particular arrangement of geometrical shapes.  Each round, one 'speaker' will describe the pattern, as accurately as possible without revealing the image to the group.  The remaining group members will listen and draw the picture to reflect how it is described to them. Patients with the role of 'listener' cannot ask clarifying questions but, may request that the speaker repeat a direction. Once the drawings are complete, the presenter will show the rest of the group the picture and compare how close each person came to drawing the picture. LRT will facilitate a post-activity discussion regarding effective communication and the importance of planning, listening, and asking for clarification in daily interactions with others.  Education: Environmental consultant, Active listening, Support systems, Discharge planning  Education Outcome: Acknowledges understanding/In group clarification offered/Needs additional education.    Affect/Mood: N/A   Participation Level: Did not attend    Clinical Observations/Individualized Feedback:      Plan: Continue to engage patient in RT group sessions 2-3x/week.   Devlin Mcveigh-McCall, LRT,CTRS 07/12/2024 1:52 PM

## 2024-07-12 NOTE — Plan of Care (Signed)
  Problem: Education: Goal: Emotional status will improve Outcome: Progressing Goal: Verbalization of understanding the information provided will improve Outcome: Progressing   Problem: Activity: Goal: Interest or engagement in activities will improve Outcome: Progressing   Problem: Coping: Goal: Ability to verbalize frustrations and anger appropriately will improve Outcome: Progressing

## 2024-07-12 NOTE — Progress Notes (Signed)
(  Sleep Hours) - 9hrs (Any PRNs that were needed, meds refused, or side effects to meds)- klonopin  (Any disturbances and when (visitation, over night)- none (Concerns raised by the patient)- none (SI/HI/AVH)- denies

## 2024-07-12 NOTE — BHH Group Notes (Signed)
 Adult Psychoeducational Group Note  Date:  07/12/2024 Time:  12:03 PM  Group Topic/Focus: Recreational Therapy Group   Participation Level:  Did Not Attend  Participation Quality:    Affect:    Cognitive:    Insight:   Engagement in Group:    Modes of Intervention:    Additional Comments:    [name] 07/12/2024, 12:03 PM

## 2024-07-12 NOTE — Progress Notes (Signed)
 [facility] MD Progress Note  07/12/2024 4:23 PM Kara Stephens  MRN:  [id number] Subjective:   Kara Stephens is a 38 yr old female who presented on 10/28 to [facility] due to intrusive Suicidal Thoughts and Homicidal Thoughts, she was admitted to [facility] on 10/29.  PPHx is significant for Bipolar Disorder, OCD, and Cluster B traits, and 4 Prior Psychiatric Hospitalizations (last [facility] 12/2023), and no history of Suicide Attempts.   Case was discussed in the multidisciplinary team. MAR was reviewed and patient was compliant with medications.  She received PRN Klonopin  yesterday.   Psychiatric Team made the following recommendations yesterday: -Increase Clomipramine  to 50 mg BID for OCD  -Continue Lamictal  250 mg daily for mood stability -Continue Seroquel  XR 300 mg QHS for mood stability and sleep -Continue Namenda  10 mg BID for reported intrusive thoughts (patient takes at home and reports benefit)  -Continue Trazodone  50 mg QHS    On interview today patient reports she slept better last night.  She reports her appetite is doing fair.  She reports no SI.  She reports continued intrusive thoughts.  She reports some VH- door sign moving and AH- voice saying sleep.  She reports no Paranoia or Ideas of Reference.  She reports being unsure of issues with her medications.  She questions if the seeing and hearing things is due to her medication.  Encouraged her to continue pushing back against these intrusive thoughts.  She requested we contact her husband and discussed we will.  She reports no other concerns at present.   Called patient's husband, Ketty Bitton, [phone].  He reports that patient has had several significant stressors recently.  He reports that his daughter was hospitalized due to substance-induced psychosis, her grandmother passed away, and their elderly blind dog unfortunately drowned in the pool.  He reports he thinks this might be where the intrusive thoughts of his son got into the  pool come from.  Discussed medications with him and he reports that he has noticed she has seemed a little slower recently.  Encouraged him to call back if any questions arise.  He reports no concerns with them.   Principal Problem: Bipolar 1 disorder, mixed, moderate (HCC) Diagnosis: Principal Problem:   Bipolar 1 disorder, mixed, moderate (HCC) Active Problems:   Obsessive compulsive disorder  Total Time spent with patient:  I personally spent 35 minutes on the unit in direct patient care. The direct patient care time included face-to-face time with the patient, reviewing the patient's chart, communicating with other professionals, and coordinating care.    Past Psychiatric History:  Bipolar Disorder, OCD, and Cluster B traits, and 4 Prior Psychiatric Hospitalizations (last [facility] 12/2023), and no history of Suicide Attempts.  Past Medical History:  Past Medical History:  Diagnosis Date   Anxiety    Back spasm    Bipolar I disorder (HCC)    Borderline personality disorder (HCC)    Class 3 obesity (HCC)    Cluster B personality disorder (HCC)    Hypercholesterolemia    Hypertension    Hypertriglyceridemia    Somatic symptom disorder    Tachycardia    Type 2 diabetes mellitus (HCC)    Vitamin D  insufficiency     Past Surgical History:  Procedure Laterality Date   APPENDECTOMY     CHOLECYSTECTOMY     HAND SURGERY     SHOULDER SURGERY     Family History:  Family History  Problem Relation Age of Onset   Hypertension Mother  Hyperlipidemia Mother    Suicidality Father    Depression Father    Family Psychiatric  History:  Father- Depression, Suicidality   Social History:  Social History   Substance and Sexual Activity  Alcohol Use No     Social History   Substance and Sexual Activity  Drug Use No    Social History   Socioeconomic History   Marital status: Married    Spouse name: Not on file   Number of children: Not on file   Years of education: Not on  file   Highest education level: Not on file  Occupational History   Not on file  Tobacco Use   Smoking status: Every Day    Current packs/day: 0.50    Average packs/day: 0.5 packs/day for 5.7 years (2.8 ttl pk-yrs)    Types: Cigarettes    Start date: 11/2018    Last attempt to quit: 09/10/2016   Smokeless tobacco: Never  Vaping Use   Vaping status: Never Used  Substance and Sexual Activity   Alcohol use: No   Drug use: No   Sexual activity: Not on file  Other Topics Concern   Not on file  Social History Narrative   Not on file   Social Drivers of Health   Financial Resource Strain: Not on file  Food Insecurity: Food Insecurity Present (07/04/2024)   Hunger Vital Sign    Worried About Running Out of Food in the Last Year: Sometimes true    Ran Out of Food in the Last Year: Never true  Transportation Needs: No Transportation Needs (07/04/2024)   PRAPARE - Administrator, Civil Service (Medical): No    Lack of Transportation (Non-Medical): No  Physical Activity: Not on file  Stress: Stress Concern Present (11/16/2023)   Received from [facility] - Occupational Stress Questionnaire    Feeling of Stress : Very much  Social Connections: Unknown (01/10/2022)   Received from [facility]   Social Network    Social Network: Not on file   Additional Social History:                         Sleep: Good per staff but patient reports poor Estimated Sleeping Duration (Last 24 Hours): 7.50-8.00 hours (Due to [employer] Time, the durations displayed may not accurately represent documentation during the time change interval)  Appetite:  Fair  Current Medications: Current Facility-Administered Medications  Medication Dose Route Frequency Provider Last Rate Last Admin   acetaminophen  (TYLENOL ) tablet 650 mg  650 mg Oral Q6H PRN McLauchlin, Angela, NP   650 mg at 07/08/24 1818   alum & mag hydroxide-simeth  (MAALOX/MYLANTA) 200-200-20 MG/5ML suspension 30 mL  30 mL Oral Q4H PRN McLauchlin, Angela, NP       clomiPRAMINE  (ANAFRANIL ) capsule 50 mg  50 mg Oral BID Towana Leita SAILOR, MD   50 mg at 07/12/24 9241   clonazePAM  (KLONOPIN ) disintegrating tablet 0.5 mg  0.5 mg Oral QHS Towana Leita SAILOR, MD   0.5 mg at 07/11/24 2049   clonazePAM  (KLONOPIN ) tablet 0.5 mg  0.5 mg Oral BID PRN Towana Leita SAILOR, MD   0.5 mg at 07/12/24 1503   haloperidol  (HALDOL ) tablet 5 mg  5 mg Oral Q6H PRN Towana Leita SAILOR, MD       Or   haloperidol  lactate (HALDOL ) injection 5 mg  5 mg Intramuscular Q6H PRN Towana Leita SAILOR, MD  5 mg at 07/06/24 1508   lamoTRIgine  (LAMICTAL ) tablet 250 mg  250 mg Oral Daily Towana Leita SAILOR, MD   250 mg at 07/12/24 9241   magnesium  hydroxide (MILK OF MAGNESIA) suspension 30 mL  30 mL Oral Daily PRN McLauchlin, Angela, NP       melatonin tablet 3 mg  3 mg Oral QHS Butler, Laura N, MD   3 mg at 07/11/24 2048   memantine  (NAMENDA ) tablet 10 mg  10 mg Oral BID Butler, Laura N, MD   10 mg at 07/12/24 9241   metFORMIN  (GLUCOPHAGE ) tablet 500 mg  500 mg Oral Q breakfast Towana Leita SAILOR, MD   500 mg at 07/12/24 0759   propranolol  ER (INDERAL  LA) 24 hr capsule 60 mg  60 mg Oral Daily Butler, Laura N, MD   60 mg at 07/12/24 9241   QUEtiapine  (SEROQUEL  XR) 24 hr tablet 300 mg  300 mg Oral QHS Butler, Laura N, MD   300 mg at 07/11/24 2049   traZODone  (DESYREL ) tablet 50 mg  50 mg Oral QHS Butler, Laura N, MD   50 mg at 07/11/24 2048    Lab Results: No results found for this or any previous visit (from the past 48 hours).  Blood Alcohol level:  Lab Results  Component Value Date   [facility] <15 07/04/2024   ETH <10 08/10/2022    Metabolic Disorder Labs: Lab Results  Component Value Date   HGBA1C 5.4 07/04/2024   MPG 108.28 07/04/2024   MPG 119.76 12/07/2023   No results found for: PROLACTIN Lab Results  Component Value Date   CHOL 223 (H) 07/04/2024   TRIG 100 07/04/2024   HDL 53 07/04/2024    CHOLHDL 4.2 07/04/2024   VLDL 20 07/04/2024   LDLCALC 150 (H) 07/04/2024   LDLCALC 92 12/07/2023    Physical Findings: AIMS:  ,  ,  ,  ,  ,  ,   CIWA:    COWS:     Musculoskeletal: Strength & Muscle Tone: within normal limits Gait & Station: normal Patient leans: N/A  Psychiatric Specialty Exam:  Presentation  General Appearance:  Appropriate for Environment; Casual  Eye Contact: Good  Speech: Clear and Coherent; Normal Rate  Speech Volume: Normal  Handedness: Right   Mood and Affect  Mood: Dysphoric; Anxious  Affect: Congruent   Thought Process  Thought Processes: Coherent  Descriptions of Associations:Intact  Orientation:Full (Time, Place and Person)  Thought Content:WDL  History of Schizophrenia/Schizoaffective disorder:No  Duration of Psychotic Symptoms:N/A  Hallucinations:Hallucinations: None  Ideas of Reference:None  Suicidal Thoughts:Suicidal Thoughts: No  Homicidal Thoughts:Homicidal Thoughts: -- (Continues to have ego-dystonic intrusive thoughts)   Sensorium  Memory: Immediate Fair  Judgment: Intact  Insight: Present   Executive Functions  Concentration: Fair  Attention Span: Fair  Recall: [employer] of Knowledge: Fair  Language: Fair   Psychomotor Activity  Psychomotor Activity:Psychomotor Activity: Normal   Assets  Assets: Manufacturing Systems Engineer; Resilience   Sleep  Sleep:Sleep: -- (reports somewhat improved)    Physical Exam: Physical Exam Vitals and nursing note reviewed.  Constitutional:      General: She is not in acute distress.    Appearance: Normal appearance. She is obese. She is not ill-appearing or toxic-appearing.  HENT:     Head: Normocephalic and atraumatic.  Pulmonary:     Effort: Pulmonary effort is normal.  Musculoskeletal:        General: Normal range of motion.  Neurological:     General:  No focal deficit present.     Mental Status: She is alert.    Review of Systems   Respiratory:  Negative for cough and shortness of breath.   Cardiovascular:  Negative for chest pain.  Gastrointestinal:  Negative for abdominal pain, constipation, diarrhea, nausea and vomiting.  Neurological:  Negative for dizziness, weakness and headaches.  Psychiatric/Behavioral:  Positive for depression. Negative for hallucinations and suicidal ideas. The patient is nervous/anxious.    Blood pressure 127/82, pulse 85, temperature 98.3 F (36.8 C), temperature source Oral, resp. rate 18, height 5' 6 (1.676 m), weight 106.7 kg, SpO2 97%. Body mass index is 37.96 kg/m.   Treatment Plan Summary: Daily contact with patient to assess and evaluate symptoms and progress in treatment and Medication management  SHAYLENE PAGANELLI is a 38 yr old female who presented on 10/28 to [facility] due to intrusive Suicidal Thoughts and Homicidal Thoughts, she was admitted to [facility] on 10/29.  PPHx is significant for Bipolar Disorder, OCD, and Cluster B traits, and 4 Prior Psychiatric Hospitalizations (last [facility] 12/2023), and no history of Suicide Attempts.    Averyanna is continuing to have ego-dystonic intrusive thoughts.  She is reporting visual hallucinations of the sign on her door moving when she looked at it and hearing the word sleep yesterday.  These are most likely due to her continued concern about her intrusive thoughts. We will not make any changes to her medications at this time.  We will continue to monitor.    Bipolar I disorder, current episode mixed, no current psychotic features  OCD : -Continue Clomipramine  50 mg BID for OCD -Continue Lamictal  250 mg daily for mood stability -Continue Seroquel  XR 300 mg QHS for mood stability and sleep -Continue Namenda  10 mg BID for reported intrusive thoughts (patient takes at home and reports benefit)  -Continue Trazodone  50 mg QHS -Continue Klonopin  0.5 mg BID PRN for anxiety + scheduled 0.5 mg at bedtime for sleep              -patient notified klonopin   will not be continued after hospital and to reserve for severe anxiety only -Continue Agitation Protocol: Haldol /Ativan /Benadryl    -Continue Metformin  500 mg daily -Continue Propanolol ER 60 mg daily -Continue Melatonin 3 mg QHS -Continue PRN's: Tylenol , Maalox, Milk of Magnesia   --  The risks/benefits/side-effects/alternatives to medications were discussed in detail with the patient and time was given for questions. The patient consents to medication trials.                -- Metabolic profile and EKG monitoring obtained while on an atypical antipsychotic (BMI: 37.96  Lipid Panel: WNL except Chol: 223, LDL: 150 HbgA1c: 5.4  EKG: Sinus Rhythm w/ PVC w/QTc: 450)              -- Encouraged patient to participate in unit milieu and in scheduled group therapies              -- Short Term Goals: Ability to identify changes in lifestyle to reduce recurrence of condition will improve, Ability to verbalize feelings will improve, Ability to disclose and discuss suicidal ideas, Ability to demonstrate self-control will improve, Ability to identify and develop effective coping behaviors will improve, Ability to maintain clinical measurements within normal limits will improve, Compliance with prescribed medications will improve, and Ability to identify triggers associated with substance abuse/mental health issues will improve             -- Long Term Goals: Improvement  in symptoms so as ready for discharge   Safety and Monitoring:             -- Voluntary admission to inpatient psychiatric unit for safety, stabilization and treatment             -- Daily contact with patient to assess and evaluate symptoms and progress in treatment             -- Patient's case to be discussed in multi-disciplinary team meeting             -- Observation Level : q15 minute checks             -- Vital signs:  q12 hours             -- Precautions: suicide, elopement, and assault  Discharge Planning:              --  Social work and case management to assist with discharge planning and identification of hospital follow-up needs prior to discharge             -- Estimated LOS: 3-5 more days             -- Discharge Concerns: Need to establish a safety plan; Medication compliance and effectiveness             -- Discharge Goals: Return home with outpatient referrals for mental health follow-up including medication management/psychotherapy    Marsa GORMAN Rosser, DO 07/12/2024, 4:23 PM

## 2024-07-12 NOTE — Progress Notes (Signed)
   07/12/24 0900  Psychosocial Assessment  Patient Complaints Other (Comment);Anxiety (thoughts of harming her son by throwing him in pool. toilet paper wrapper moving VH)  Eye Contact Fair  Facial Expression Flat  Affect Anxious  Speech Soft  Interaction Assertive  Motor Activity Slow  Appearance/Hygiene Unremarkable  Behavior Characteristics Cooperative;Anxious  Mood Preoccupied  Thought Process  Coherency WDL  Content Preoccupation  Delusions None reported or observed  Perception WDL  Hallucination Auditory  Judgment Impaired  Confusion None  Danger to Self  Current suicidal ideation? Denies  Danger to Others  Danger to Others None reported or observed  Danger to Others Abnormal  Harmful Behavior to others No threats or harm toward other people

## 2024-07-12 NOTE — Plan of Care (Signed)
   Problem: Education: Goal: Knowledge of [facility] Education information/materials will improve Outcome: Progressing Goal: Emotional status will improve Outcome: Progressing Goal: Mental status will improve Outcome: Progressing Goal: Verbalization of understanding the information provided will improve Outcome: Progressing

## 2024-07-12 NOTE — Group Note (Signed)
 Date:  07/12/2024 Time:  9:21 PM  Group Topic/Focus:  Wrap-Up Group:   The focus of this group is to help patients review their daily goal of treatment and discuss progress on daily workbooks.    Participation Level:  Active  Participation Quality:  Appropriate  Affect:  Appropriate  Cognitive:  Appropriate  Insight: Appropriate  Engagement in Group:  Engaged  Modes of Intervention:  Education and Exploration  Additional Comments:  Patient attended and participated in group tonight.  She reports that her goal today was to communicate with the doctor and nurses to let them know what is going on.  She did and her mother visited with her today also.  Gwenn Chillington Dacosta 07/12/2024, 9:21 PM

## 2024-07-13 DIAGNOSIS — F429 Obsessive-compulsive disorder, unspecified: Secondary | ICD-10-CM | POA: Diagnosis not present

## 2024-07-13 DIAGNOSIS — F3162 Bipolar disorder, current episode mixed, moderate: Secondary | ICD-10-CM | POA: Diagnosis not present

## 2024-07-13 NOTE — [facility] Group Notes (Signed)
 Adult Psychoeducational Group Note  Date:  07/13/2024 Time:  11:43 AM  Group Topic/Focus: Healthy Choices Making Healthy Choices:   The focus of this group is to help patients identify negative/unhealthy choices they were using prior to admission and identify positive/healthier coping strategies to replace them upon discharge.  Participation Level:  Active  Participation Quality:  Attentive  Affect:  Appropriate  Cognitive:  Appropriate  Insight: Good  Engagement in Group:  Engaged  Modes of Intervention:  Discussion  Additional Comments:    [name] 07/13/2024, 11:43 AM

## 2024-07-13 NOTE — Progress Notes (Signed)
   07/13/24 0900  Psych Admission Type (Psych Patients Only)  Admission Status Voluntary  Psychosocial Assessment  Patient Complaints Anxiety;Depression  Eye Contact Fair  Facial Expression Flat  Affect Anxious  Speech Soft  Interaction Assertive  Motor Activity Slow  Appearance/Hygiene Unremarkable  Behavior Characteristics Cooperative;Anxious  Mood Preoccupied  Thought Process  Coherency WDL  Content Preoccupation  Delusions None reported or observed  Perception WDL  Hallucination None reported or observed  Judgment Limited  Confusion None  Danger to Self  Current suicidal ideation? Denies  Agreement Not to Harm Self Yes  Description of Agreement verbal  Danger to Others  Danger to Others Reported or observed (reports thoughts of throwing child into pool)

## 2024-07-13 NOTE — Group Note (Signed)
 Date:  07/13/2024 Time:  8:52 PM  Group Topic/Focus:  Wrap-Up Group:   The focus of this group is to help patients review their daily goal of treatment and discuss progress on daily workbooks.    Participation Level:  Active  Participation Quality:  Appropriate  Affect:  Appropriate  Cognitive:  Appropriate  Insight: Appropriate  Engagement in Group:  Engaged  Modes of Intervention:  Education  Additional Comments:  Patient attended and participated in group tonight.  She reports that since she has been here she lean about things that she do to get herwelf to sleep.  Kara Stephens 07/13/2024, 8:52 PM

## 2024-07-13 NOTE — Plan of Care (Signed)
   Problem: Education: Goal: Knowledge of [facility] Education information/materials will improve Outcome: Progressing Goal: Emotional status will improve Outcome: Progressing Goal: Mental status will improve Outcome: Progressing Goal: Verbalization of understanding the information provided will improve Outcome: Progressing

## 2024-07-13 NOTE — Group Note (Signed)
 Occupational Therapy Group Note  Group Topic: Sleep Hygiene  Group Date: 07/13/2024 Start Time: 1535 End Time: 1600 Facilitators: Dot Dallas MATSU, OT   Group Description: Group encouraged increased participation and engagement through topic focused on sleep hygiene. Patients reflected on the quality of sleep they typically receive and identified areas that need improvement. Group was given background information on sleep and sleep hygiene, including common sleep disorders. Group members also received information on how to improve one's sleep and introduced a sleep diary as a tool that can be utilized to track sleep quality over a length of time. Group session ended with patients identifying one or more strategies they could utilize or implement into their sleep routine in order to improve overall sleep quality.        Therapeutic Goal(s):  Identify one or more strategies to improve overall sleep hygiene  Identify one or more areas of sleep that are negatively impacted (sleep too much, too little, etc)     Participation Level: Engaged   Participation Quality: Independent   Behavior: Appropriate   Speech/Thought Process: Relevant   Affect/Mood: Appropriate   Insight: Fair   Judgement: Fair      Modes of Intervention: Education  Patient Response to Interventions:  Attentive   Plan: Continue to engage patient in OT groups 2 - 3x/week.  07/13/2024  [place] MATSU Dot, OT   Herschel Fleagle, OT

## 2024-07-13 NOTE — [facility] Group Notes (Signed)
 Adult Psychoeducational Group Note  Date:  07/13/2024 Time:  4:14 PM  Group Topic/Focus: Occupational Therapy Self Care:   The focus of this group is to help patients understand the importance of self-care in order to improve or restore emotional, physical, spiritual, interpersonal, and financial health.  Participation Level:  Active  Participation Quality:  Appropriate  Affect:  Appropriate  Cognitive:  Appropriate  Insight: Appropriate  Engagement in Group:  Engaged  Modes of Intervention:  Discussion  Additional Comments:    Debroh Sieloff Lee 07/13/2024, 4:14 PM

## 2024-07-13 NOTE — Progress Notes (Addendum)
 [facility] MD Progress Note  07/13/2024 4:07 PM Kara Stephens  MRN:  [id number] Subjective:   Kara Stephens is a 38 yr old female who presented on 10/28 to [facility] due to intrusive Suicidal Thoughts and Homicidal Thoughts, she was admitted to [facility] on 10/29.  PPHx is significant for Bipolar Disorder, OCD, and Cluster B traits, and 4 Prior Psychiatric Hospitalizations (last [facility] 12/2023), and no history of Suicide Attempts.   Case was discussed in the multidisciplinary team. MAR was reviewed and patient was compliant with medications.  She received PRN Klonopin  yesterday.   Psychiatric Team made the following recommendations yesterday: -Continue Clomipramine  50 mg BID for OCD  -Continue Lamictal  250 mg daily for mood stability -Continue Seroquel  XR 300 mg QHS for mood stability and sleep -Continue Namenda  10 mg BID for reported intrusive thoughts (patient takes at home and reports benefit)  -Continue Trazodone  50 mg QHS    On interview today patient reports she slept fair last night.  She reports her appetite is doing good.  She reports no SI, HI, or AVH.  She reports no Paranoia or Ideas of Reference.  She reports no issues with her medications.  She reports that the only thing that she saw was first thing in the morning when she woke up the ceiling tile above her bed was doubled but this quickly vanished in less than a minute after waking up.  Discussed with her this is most likely due to to tiredness or could be a hypnopomic hallucination but we would keep an eye on this.  Discussed with her that since she is getting better and does have concerns about medication we would not make any changes at this time she is agreeable with this.  She reports no other concerns at present.     Principal Problem: Bipolar 1 disorder, mixed, moderate (HCC) Diagnosis: Principal Problem:   Bipolar 1 disorder, mixed, moderate (HCC) Active Problems:   Obsessive compulsive disorder  Total Time spent with  patient:  I personally spent 35 minutes on the unit in direct patient care. The direct patient care time included face-to-face time with the patient, reviewing the patient's chart, communicating with other professionals, and coordinating care.    Past Psychiatric History:  Bipolar Disorder, OCD, and Cluster B traits, and 4 Prior Psychiatric Hospitalizations (last [facility] 12/2023), and no history of Suicide Attempts.  Past Medical History:  Past Medical History:  Diagnosis Date   Anxiety    Back spasm    Bipolar I disorder (HCC)    Borderline personality disorder (HCC)    Class 3 obesity (HCC)    Cluster B personality disorder (HCC)    Hypercholesterolemia    Hypertension    Hypertriglyceridemia    Somatic symptom disorder    Tachycardia    Type 2 diabetes mellitus (HCC)    Vitamin D  insufficiency     Past Surgical History:  Procedure Laterality Date   APPENDECTOMY     CHOLECYSTECTOMY     HAND SURGERY     SHOULDER SURGERY     Family History:  Family History  Problem Relation Age of Onset   Hypertension Mother    Hyperlipidemia Mother    Suicidality Father    Depression Father    Family Psychiatric  History:  Father- Depression, Suicidality   Social History:  Social History   Substance and Sexual Activity  Alcohol Use No     Social History   Substance and Sexual Activity  Drug Use No  Social History   Socioeconomic History   Marital status: Married    Spouse name: Not on file   Number of children: Not on file   Years of education: Not on file   Highest education level: Not on file  Occupational History   Not on file  Tobacco Use   Smoking status: Every Day    Current packs/day: 0.50    Average packs/day: 0.5 packs/day for 5.7 years (2.8 ttl pk-yrs)    Types: Cigarettes    Start date: 11/2018    Last attempt to quit: 09/10/2016   Smokeless tobacco: Never  Vaping Use   Vaping status: Never Used  Substance and Sexual Activity   Alcohol use: No   Drug  use: No   Sexual activity: Not on file  Other Topics Concern   Not on file  Social History Narrative   Not on file   Social Drivers of Health   Financial Resource Strain: Not on file  Food Insecurity: Food Insecurity Present (07/04/2024)   Hunger Vital Sign    Worried About Running Out of Food in the Last Year: Sometimes true    Ran Out of Food in the Last Year: Never true  Transportation Needs: No Transportation Needs (07/04/2024)   PRAPARE - Administrator, Civil Service (Medical): No    Lack of Transportation (Non-Medical): No  Physical Activity: Not on file  Stress: Stress Concern Present (11/16/2023)   Received from [facility] - Occupational Stress Questionnaire    Feeling of Stress : Very much  Social Connections: Unknown (01/10/2022)   Received from [facility]   Social Network    Social Network: Not on file   Additional Social History:                         Sleep: Fair per staff but patient reports poor Estimated Sleeping Duration (Last 24 Hours): 5.75-7.25 hours (Due to [employer] Time, the durations displayed may not accurately represent documentation during the time change interval)  Appetite:  Good  Current Medications: Current Facility-Administered Medications  Medication Dose Route Frequency Provider Last Rate Last Admin   acetaminophen  (TYLENOL ) tablet 650 mg  650 mg Oral Q6H PRN McLauchlin, Angela, NP   650 mg at 07/08/24 1818   alum & mag hydroxide-simeth (MAALOX/MYLANTA) 200-200-20 MG/5ML suspension 30 mL  30 mL Oral Q4H PRN McLauchlin, Angela, NP       clomiPRAMINE  (ANAFRANIL ) capsule 50 mg  50 mg Oral BID Towana Leita SAILOR, MD   50 mg at 07/13/24 9161   clonazePAM  (KLONOPIN ) disintegrating tablet 0.5 mg  0.5 mg Oral QHS Towana Leita SAILOR, MD   0.5 mg at 07/12/24 2051   clonazePAM  (KLONOPIN ) tablet 0.5 mg  0.5 mg Oral BID PRN Towana Leita SAILOR, MD   0.5 mg at 07/13/24 1132   haloperidol   (HALDOL ) tablet 5 mg  5 mg Oral Q6H PRN Towana Leita SAILOR, MD       Or   haloperidol  lactate (HALDOL ) injection 5 mg  5 mg Intramuscular Q6H PRN Towana Leita SAILOR, MD   5 mg at 07/06/24 1508   lamoTRIgine  (LAMICTAL ) tablet 250 mg  250 mg Oral Daily Towana Leita SAILOR, MD   250 mg at 07/13/24 9161   magnesium  hydroxide (MILK OF MAGNESIA) suspension 30 mL  30 mL Oral Daily PRN McLauchlin, Angela, NP       melatonin tablet 3 mg  3 mg Oral QHS Butler, Laura N, MD   3 mg at 07/12/24 2051   memantine  (NAMENDA ) tablet 10 mg  10 mg Oral BID Butler, Laura N, MD   10 mg at 07/13/24 9161   metFORMIN  (GLUCOPHAGE ) tablet 500 mg  500 mg Oral Q breakfast Towana Leita SAILOR, MD   500 mg at 07/13/24 9161   propranolol  ER (INDERAL  LA) 24 hr capsule 60 mg  60 mg Oral Daily Butler, Laura N, MD   60 mg at 07/13/24 9162   QUEtiapine  (SEROQUEL  XR) 24 hr tablet 300 mg  300 mg Oral QHS Butler, Laura N, MD   300 mg at 07/12/24 2051   traZODone  (DESYREL ) tablet 50 mg  50 mg Oral QHS Butler, Laura N, MD   50 mg at 07/12/24 2054    Lab Results: No results found for this or any previous visit (from the past 48 hours).  Blood Alcohol level:  Lab Results  Component Value Date   [facility] <15 07/04/2024   ETH <10 08/10/2022    Metabolic Disorder Labs: Lab Results  Component Value Date   HGBA1C 5.4 07/04/2024   MPG 108.28 07/04/2024   MPG 119.76 12/07/2023   No results found for: PROLACTIN Lab Results  Component Value Date   CHOL 223 (H) 07/04/2024   TRIG 100 07/04/2024   HDL 53 07/04/2024   CHOLHDL 4.2 07/04/2024   VLDL 20 07/04/2024   LDLCALC 150 (H) 07/04/2024   LDLCALC 92 12/07/2023    Physical Findings: AIMS:  ,  ,  ,  ,  ,  ,   CIWA:    COWS:     Musculoskeletal: Strength & Muscle Tone: within normal limits Gait & Station: normal Patient leans: N/A  Psychiatric Specialty Exam:  Presentation  General Appearance:  Appropriate for Environment; Casual  Eye Contact: Good  Speech: Clear and Coherent;  Normal Rate  Speech Volume: Normal  Handedness: Right   Mood and Affect  Mood: Dysphoric; Anxious  Affect: Congruent   Thought Process  Thought Processes: Coherent  Descriptions of Associations:Intact  Orientation:Full (Time, Place and Person)  Thought Content:WDL; Logical  History of Schizophrenia/Schizoaffective disorder:No  Duration of Psychotic Symptoms:N/A  Hallucinations:Hallucinations: None  Ideas of Reference:None  Suicidal Thoughts:Suicidal Thoughts: No  Homicidal Thoughts:Homicidal Thoughts: -- (Continues to have ego-dystonic intrusive thoughts)   Sensorium  Memory: Immediate Fair  Judgment: Intact  Insight: Present   Executive Functions  Concentration: Fair  Attention Span: Fair  Recall: Fair  Fund of Knowledge: Fair  Language: Fair   Psychomotor Activity  Psychomotor Activity:Psychomotor Activity: Normal   Assets  Assets: Manufacturing Systems Engineer; Resilience   Sleep  Sleep:Sleep: Fair    Physical Exam: Physical Exam Vitals and nursing note reviewed.  Constitutional:      General: She is not in acute distress.    Appearance: Normal appearance. She is obese. She is not ill-appearing or toxic-appearing.  HENT:     Head: Normocephalic and atraumatic.  Pulmonary:     Effort: Pulmonary effort is normal.  Musculoskeletal:        General: Normal range of motion.  Neurological:     General: No focal deficit present.     Mental Status: She is alert.    Review of Systems  Respiratory:  Negative for cough and shortness of breath.   Cardiovascular:  Negative for chest pain.  Gastrointestinal:  Negative for abdominal pain, constipation, diarrhea, nausea and vomiting.  Neurological:  Negative for dizziness, weakness and headaches.  Psychiatric/Behavioral:  Positive for depression. Negative for hallucinations and suicidal ideas. The patient is nervous/anxious.    Blood pressure 119/78, pulse 79, temperature 98.3 F  (36.8 C), temperature source Oral, resp. rate 18, height 5' 6 (1.676 m), weight 106.7 kg, SpO2 97%. Body mass index is 37.96 kg/m.   Treatment Plan Summary: Daily contact with patient to assess and evaluate symptoms and progress in treatment and Medication management  Kara Stephens is a 38 yr old female who presented on 10/28 to [facility] due to intrusive Suicidal Thoughts and Homicidal Thoughts, she was admitted to [facility] on 10/29.  PPHx is significant for Bipolar Disorder, OCD, and Cluster B traits, and 4 Prior Psychiatric Hospitalizations (last [facility] 12/2023), and no history of Suicide Attempts.    Deshunda still having to be ego dystonic intrusive thoughts.  However, her sleep is improving and both she and her husband reports that this is always the first time that she is improving and returning to herself.  She is not reporting any hallucinations today.  Due to her improvement we will not make any changes to her medications at this time.  We will continue to monitor.   Bipolar I disorder, current episode mixed, no current psychotic features  OCD : -Continue Clomipramine  50 mg BID for OCD -Continue Lamictal  250 mg daily for mood stability -Continue Seroquel  XR 300 mg QHS for mood stability and sleep -Continue Namenda  10 mg BID for reported intrusive thoughts (patient takes at home and reports benefit)  -Continue Trazodone  50 mg QHS -Continue Klonopin  0.5 mg BID PRN for anxiety + scheduled 0.5 mg at bedtime for sleep              -patient notified klonopin  will not be continued after hospital and to reserve for severe anxiety only -Continue Agitation Protocol: Haldol /Ativan /Benadryl    -Continue Metformin  500 mg daily -Continue Propanolol ER 60 mg daily -Continue Melatonin 3 mg QHS -Continue PRN's: Tylenol , Maalox, Milk of Magnesia   --  The risks/benefits/side-effects/alternatives to medications were discussed in detail with the patient and time was given for questions. The patient  consents to medication trials.                -- Metabolic profile and EKG monitoring obtained while on an atypical antipsychotic (BMI: 37.96  Lipid Panel: WNL except Chol: 223, LDL: 150 HbgA1c: 5.4  EKG: Sinus Rhythm w/ PVC w/QTc: 450)              -- Encouraged patient to participate in unit milieu and in scheduled group therapies              -- Short Term Goals: Ability to identify changes in lifestyle to reduce recurrence of condition will improve, Ability to verbalize feelings will improve, Ability to disclose and discuss suicidal ideas, Ability to demonstrate self-control will improve, Ability to identify and develop effective coping behaviors will improve, Ability to maintain clinical measurements within normal limits will improve, Compliance with prescribed medications will improve, and Ability to identify triggers associated with substance abuse/mental health issues will improve             -- Long Term Goals: Improvement in symptoms so as ready for discharge   Safety and Monitoring:             -- Voluntary admission to inpatient psychiatric unit for safety, stabilization and treatment             -- Daily contact with patient to assess and evaluate symptoms  and progress in treatment             -- Patient's case to be discussed in multi-disciplinary team meeting             -- Observation Level : q15 minute checks             -- Vital signs:  q12 hours             -- Precautions: suicide, elopement, and assault  Discharge Planning:              -- Social work and case management to assist with discharge planning and identification of hospital follow-up needs prior to discharge             -- Estimated LOS: 3-5 more days             -- Discharge Concerns: Need to establish a safety plan; Medication compliance and effectiveness             -- Discharge Goals: Return home with outpatient referrals for mental health follow-up including medication management/psychotherapy    Marsa GORMAN Rosser, DO 07/13/2024, 4:07 PM

## 2024-07-13 NOTE — Group Note (Signed)
 Recreation Therapy Group Note   Group Topic:Coping Skills  Group Date: 07/13/2024 Start Time: 1005 End Time: 1035 Facilitators: Claudius Mich-McCall, LRT,CTRS Location: [address] Dayroom   Group Topic: Coping Skills  Goal Area(s) Addresses:  Patient will define what a coping skill is. Patient will identify positive coping skills. Patient will identify benefits of using coping skills post d/c.  Behavioral Response: Active  Intervention: Mind Map, [employer], [employer], Pencils  Activity: Mind Map. LRT and patients discussed coping skills, what they are and the benefits. Patients were then given a blank mind map with 32 boxes. The center box contained the words Coping Skills. There were 8 boxes extending from the middle box. Patients identified 8 different challenges (depression, anxiety, anger, fear, being sad, irritated, bi-polar and manic) to place in these boxes. From each of the 8 boxes, there were 3 more boxes. Patients were to identify at least 3 coping skills for each challenge identified.    Education: Pharmacologist, Building Control Surveyor.   Education Outcome: Acknowledges understanding/In group clarification offered/Needs additional education.    Affect/Mood: Flat   Participation Level: Active   Participation Quality: Independent   Behavior: Appropriate   Speech/Thought Process: Barely audible    Insight: Good   Judgement: Good   Modes of Intervention: Worksheet   Patient Response to Interventions:  Engaged   Education Outcome:  In group clarification offered    Clinical Observations/Individualized Feedback: Pt was quiet and hard to understand. Pt was attentive during group session and was able to identify coping skills. Pt identified some coping skills as talk to a friend, deep breathing, walk in the park, journal and try to control mood swings.     Plan: Continue to engage patient in RT group sessions 2-3x/week.   Duilio Heritage-McCall,  LRT,CTRS  07/13/2024 12:45 PM

## 2024-07-13 NOTE — Progress Notes (Signed)
(  Sleep Hours) - 8 (Any PRNs that were needed, meds refused, or side effects to meds)- none (Any disturbances and when (visitation, over night)- none (Concerns raised by the patient)- none (SI/HI/AVH)- denies

## 2024-07-13 NOTE — Plan of Care (Signed)
  Problem: Education: Goal: Emotional status will improve Outcome: Progressing Goal: Mental status will improve Outcome: Progressing Goal: Verbalization of understanding the information provided will improve Outcome: Progressing   Problem: Physical Regulation: Goal: Ability to maintain clinical measurements within normal limits will improve Outcome: Progressing

## 2024-07-13 NOTE — BHH Group Notes (Signed)
 Adult Psychoeducational Group Note  Date:  07/13/2024 Time:  9:39 AM  Group Topic/Focus: Goals Group Goals Group:   The focus of this group is to help patients establish daily goals to achieve during treatment and discuss how the patient can incorporate goal setting into their daily lives to aide in recovery.  Participation Level:  Active  Participation Quality:  Appropriate  Affect:  Appropriate  Cognitive:  Appropriate  Insight: Good  Engagement in Group:  Engaged  Modes of Intervention:  Discussion  Additional Comments:    [name] 07/13/2024, 9:39 AM

## 2024-07-13 NOTE — BHH Group Notes (Signed)
 Adult Psychoeducational Group Note  Date:  07/13/2024 Time:  10:55 AM  Group Topic/Focus: Recreactional Theray Group   Participation Level:  Active  Participation Quality:  Attentive  Affect:  Appropriate  Cognitive:  Appropriate  Insight: Good  Engagement in Group:  Engaged  Modes of Intervention:  Discussion  Additional Comments:    [name] 07/13/2024, 10:55 AM

## 2024-07-14 ENCOUNTER — Encounter (HOSPITAL_COMMUNITY): Payer: Self-pay

## 2024-07-14 DIAGNOSIS — F3162 Bipolar disorder, current episode mixed, moderate: Secondary | ICD-10-CM | POA: Diagnosis not present

## 2024-07-14 DIAGNOSIS — F429 Obsessive-compulsive disorder, unspecified: Secondary | ICD-10-CM | POA: Diagnosis not present

## 2024-07-14 MED ORDER — HYDROXYZINE HCL 25 MG PO TABS
25.0000 mg | ORAL_TABLET | Freq: Three times a day (TID) | ORAL | Status: DC | PRN
Start: 2024-07-14 — End: 2024-07-26
  Administered 2024-07-14 – 2024-07-19 (×5): 25 mg via ORAL
  Filled 2024-07-14 (×5): qty 1

## 2024-07-14 NOTE — BHH Suicide Risk Assessment (Addendum)
 BHH INPATIENT:  Family/Significant Other Suicide Prevention Education  Suicide Prevention Education:  Education Completed; Boby Goldberg (mom) [phone],  (name of family member/significant other) has been identified by the patient as the family member/significant other with whom the patient will be residing, and identified as the person(s) who will aid the patient in the event of a mental health crisis (suicidal ideations/suicide attempt).  With written consent from the patient, the family member/significant other has been provided the following suicide prevention education, prior to the and/or following the discharge of the patient.  Mom said that she and her husband will come from [place]  and stay with patient for 7 - 10 days after discharge so she can get acclimated to be out of the hospital.  Mom said that she can calm her down when she is upset.  Mom said patient doesn't have access to guns or weapons.  Mom said that patient experienced psychosis in the past, but her symptoms were not as intense as this time.    The suicide prevention education provided includes the following: Suicide risk factors Suicide prevention and interventions National Suicide Hotline telephone number [facility] assessment telephone number [facility] Emergency Assistance 911 [facility] and/or Residential Mobile Crisis Unit telephone number  Request made of family/significant other to: Remove weapons (e.g., guns, rifles, knives), all items previously/currently identified as safety concern.   Remove drugs/medications (over-the-counter, prescriptions, illicit drugs), all items previously/currently identified as a safety concern.  The family member/significant other verbalizes understanding of the suicide prevention education information provided.  The family member/significant other agrees to remove the items of safety concern listed above.  Fernanda Twaddell O Rodric Punch, LCSWA 07/14/2024,  3:07 PM

## 2024-07-14 NOTE — Progress Notes (Signed)
(  Sleep Hours) -8.25 (Any PRNs that were needed, meds refused, or side effects to meds)- None (Any disturbances and when (visitation, over night)- None (Concerns raised by the patient)- none (SI/HI/AVH)- continues to endorse intrusive thoughts to throw her 38 year old son in pool

## 2024-07-14 NOTE — Progress Notes (Signed)
 Spirituality group facilitated by Elia Rockie Sofia, BCC.  Group Description: Group focused on topic of hope. Patients participated in facilitated discussion around topic, connecting with one another around experiences and definitions for hope. Group members engaged with visual explorer photos, reflecting on what hope looks like for them today. Group engaged in discussion around how their definitions of hope are present today in hospital.  Modalities: Psycho-social ed, Adlerian, Narrative, MI  Patient Progress: Kara Stephens attended group.  Her affect was flat.  She did participate some and her comments were on topic and appropriate and contributed positively to the group conversation.

## 2024-07-14 NOTE — Group Note (Signed)
 Date:  07/14/2024 Time:  9:03 PM  Group Topic/Focus:  Wrap-Up Group:   The focus of this group is to help patients review their daily goal of treatment and discuss progress on daily workbooks.    Participation Level:  Active  Participation Quality:  Appropriate  Affect:  Appropriate  Cognitive:  Appropriate  Insight: Appropriate  Engagement in Group:  Engaged  Modes of Intervention:  Education and Exploration  Additional Comments:  Patient attended and participated in group tonight. She reports that she like that she has her faith and keep strong even through battles.  Gwenn Chillington Dacosta 07/14/2024, 9:03 PM

## 2024-07-14 NOTE — BHH Group Notes (Signed)
 Adult Psychoeducational Group Note  Date:  07/14/2024 Time:  11:26 AM  Group Topic/Focus: Recreation  Therapy Building Self Esteem:   The Focus of this group is helping patients become aware of the effects of self-esteem on their lives, the things they and others do that enhance or undermine their self-esteem, seeing the relationship between their level of self-esteem and the choices they make and learning ways to enhance self-esteem.  Participation Level:  na  Participation Quality:  na  Affect:  na  Cognitive:  na  Insight: na  Engagement in Group:  na  Modes of Intervention:  na  Additional Comments:  Attended group  Kara Stephens 07/14/2024, 11:26 AM

## 2024-07-14 NOTE — Progress Notes (Signed)
 I met for a follow up visit with Kara Stephens after meeting with her on the day that she moved over to the 500 hall.  She feels grateful for the level of care and feels that the 500 hall has been good for her.  She continues to struggle with intrusive thoughts and is still very upset by this.  Her affect was flat and she described feeling sadness and grief. She is worried about what will happen after discharge and she asked for prayer, which I gladly provided.

## 2024-07-14 NOTE — Group Note (Signed)
 Recreation Therapy Group Note   Group Topic:Other  Group Date: 07/14/2024 Start Time: 1030 End Time: 1115 Facilitators: Aloise Copus-McCall, LRT,CTRS Location: [address] Dayroom   Group Topic/Focus: Music Therapy  Goal Area(s) Addresses:  Patient will identify the importance of music. Patient will identify how music can be used for self care.    Behavioral Response: Attentive   Intervention: Music  Activity: Patients were allowed to choose songs that were clean and appropriate to play in group. Patients would also share what the songs they chose ment to them.    Affect/Mood: Flat   Participation Level: Active   Participation Quality: Independent   Behavior: Appropriate   Speech/Thought Process: Focused   Insight: Good   Judgement: Good   Modes of Intervention: Music   Patient Response to Interventions:  Engaged   Education Outcome:  In group clarification offered    Clinical Observations/Individualized Feedback: Pt was quiet and observant. Pt picked and sang along with the song she chose. Pt was attentive to peers and the songs they chose.      Plan: Continue to engage patient in RT group sessions 2-3x/week.   Vanice Rappa-McCall, LRT,CTRS 07/14/2024 12:43 PM

## 2024-07-14 NOTE — Progress Notes (Signed)
 Patient stated they slept fair last night. Patient denies SI and VH but is experiencing HI and AH. Patient states they are having intrusive thoughts and hearing the multiple voices. One telling me to kill my son and the other telling me to sleep. Patient is continuing to be monitored and encouraged to continue sharing her thought process. She scored 10/10 on anxiety and feeling of hopelessness, and 10/10 on depression. Client was given atarax  for her anxiety. Patient has been calm, cooperative, and med compliant. She says her goal is to Get rid of these thoughts and states she will Try to shift my focus to help meet her goal.     07/14/24 1000  Psych Admission Type (Psych Patients Only)  Admission Status Voluntary  Psychosocial Assessment  Patient Complaints Anxiety;Depression  Eye Contact Fair  Facial Expression Flat  Affect Depressed  Speech Logical/coherent;Soft  Interaction Assertive  Motor Activity Slow  Appearance/Hygiene Unremarkable  Behavior Characteristics Cooperative;Appropriate to situation  Mood Preoccupied  Thought Process  Coherency WDL  Content Preoccupation  Delusions None reported or observed  Perception Hallucinations  Hallucination Auditory  Judgment Impaired  Confusion None  Danger to Self  Current suicidal ideation? Denies  Agreement Not to Harm Self Yes  Description of Agreement Verbal  Danger to Others  Danger to Others Reported or observed (Experiencing thoughts telling her to kill her son.)

## 2024-07-14 NOTE — BH IP Treatment Plan (Addendum)
 Interdisciplinary Treatment and Diagnostic Plan Update  07/14/2024 Time of Session: 8:55 AM - UPDATE Kara Stephens MRN: [id number]  Principal Diagnosis: Bipolar 1 disorder, mixed, moderate (HCC)  Secondary Diagnoses: Principal Problem:   Bipolar 1 disorder, mixed, moderate (HCC) Active Problems:   Obsessive compulsive disorder   Current Medications:  Current Facility-Administered Medications  Medication Dose Route Frequency Provider Last Rate Last Admin   acetaminophen  (TYLENOL ) tablet 650 mg  650 mg Oral Q6H PRN McLauchlin, Angela, NP   650 mg at 07/08/24 1818   alum & mag hydroxide-simeth (MAALOX/MYLANTA) 200-200-20 MG/5ML suspension 30 mL  30 mL Oral Q4H PRN McLauchlin, Angela, NP       clomiPRAMINE  (ANAFRANIL ) capsule 50 mg  50 mg Oral BID Butler, Laura N, MD   50 mg at 07/14/24 9157   clonazePAM  (KLONOPIN ) disintegrating tablet 0.5 mg  0.5 mg Oral QHS Towana Leita SAILOR, MD   0.5 mg at 07/13/24 2042   clonazePAM  (KLONOPIN ) tablet 0.5 mg  0.5 mg Oral BID PRN Towana Leita SAILOR, MD   0.5 mg at 07/13/24 1132   haloperidol  (HALDOL ) tablet 5 mg  5 mg Oral Q6H PRN Towana Leita SAILOR, MD       Or   haloperidol  lactate (HALDOL ) injection 5 mg  5 mg Intramuscular Q6H PRN Butler, Laura N, MD   5 mg at 07/06/24 1508   hydrOXYzine  (ATARAX ) tablet 25 mg  25 mg Oral TID PRN Pashayan, Alexander S, DO   25 mg at 07/14/24 1124   lamoTRIgine  (LAMICTAL ) tablet 250 mg  250 mg Oral Daily Towana Leita SAILOR, MD   250 mg at 07/14/24 9158   magnesium  hydroxide (MILK OF MAGNESIA) suspension 30 mL  30 mL Oral Daily PRN McLauchlin, Angela, NP       melatonin tablet 3 mg  3 mg Oral QHS Towana Leita SAILOR, MD   3 mg at 07/13/24 2042   memantine  (NAMENDA ) tablet 10 mg  10 mg Oral BID Towana Leita SAILOR, MD   10 mg at 07/14/24 0841   metFORMIN  (GLUCOPHAGE ) tablet 500 mg  500 mg Oral Q breakfast Towana Leita SAILOR, MD   500 mg at 07/14/24 9157   propranolol  ER (INDERAL  LA) 24 hr capsule 60 mg  60 mg Oral Daily Butler, Laura N, MD    60 mg at 07/14/24 9157   QUEtiapine  (SEROQUEL  XR) 24 hr tablet 300 mg  300 mg Oral QHS Towana Leita SAILOR, MD   300 mg at 07/13/24 2042   traZODone  (DESYREL ) tablet 50 mg  50 mg Oral QHS Butler, Laura N, MD   50 mg at 07/13/24 2042   PTA Medications: Medications Prior to Admission  Medication Sig Dispense Refill Last Dose/Taking   albuterol  (VENTOLIN  HFA) 108 (90 Base) MCG/ACT inhaler Inhale 2 puffs into the lungs every 4 (four) hours as needed for wheezing or shortness of breath (cough, shortness of breath or wheezing.). (Patient not taking: Reported on 07/04/2024) 6.7 g 3    aspirin EC 81 MG tablet Take 81 mg by mouth daily. Swallow whole.      Cholecalciferol  (VITAMIN D -3 PO) Take 1 capsule by mouth daily.      clonazePAM  (KLONOPIN ) 0.5 MG tablet Take 0.5 mg by mouth daily as needed for anxiety.      lamoTRIgine  (LAMICTAL ) 200 MG tablet Take 200 mg by mouth daily.      memantine  (NAMENDA ) 10 MG tablet Take 1 tablet (10 mg total) by mouth every 12 (twelve) hours. 60 tablet  0    metFORMIN  (GLUCOPHAGE -XR) 500 MG 24 hr tablet Take 1 tablet (500 mg total) by mouth daily with breakfast. (Patient taking differently: Take 1,000 mg by mouth daily with breakfast.) 30 tablet 0    Multiple Vitamin (MULTIVITAMIN WITH MINERALS) TABS tablet Take 1 tablet by mouth daily.      propranolol  ER (INDERAL  LA) 60 MG 24 hr capsule Take 1 capsule (60 mg total) by mouth daily. 30 capsule 0    QUEtiapine  (SEROQUEL ) 200 MG tablet Take 200 mg by mouth at bedtime.      traZODone  (DESYREL ) 100 MG tablet Take 1 tablet (100 mg total) by mouth at bedtime as needed for sleep. 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   Physician Treatment Plan for Primary Diagnosis: Bipolar 1 disorder, mixed, moderate (HCC) Long Term Goal(s):     Short Term Goals:    Medication Management: Evaluate patient's response, side effects, and tolerance of medication regimen.  Therapeutic Interventions: 1 to 1 sessions, Unit Group sessions and Medication administration.  Evaluation of Outcomes: Progressing  Physician Treatment Plan for Secondary Diagnosis: Principal Problem:   Bipolar 1 disorder, mixed, moderate (HCC) Active Problems:   Obsessive compulsive disorder  Long Term Goal(s):     Short Term Goals:       Medication Management: Evaluate patient's response, side effects, and tolerance of medication regimen.  Therapeutic Interventions: 1 to 1 sessions, Unit Group sessions and Medication administration.  Evaluation of Outcomes: Progressing   RN Treatment Plan for Primary Diagnosis: Bipolar 1 disorder, mixed, moderate (HCC) Long Term Goal(s): Knowledge of disease and therapeutic regimen to maintain health will improve  Short Term Goals: Ability to remain free from injury will improve, Ability to verbalize frustration and anger appropriately will improve, Ability to verbalize feelings will improve, and Ability to disclose and discuss suicidal ideas  Medication Management: RN will administer medications as ordered by provider, will assess and evaluate patient's response and provide education to patient for prescribed medication. RN will report any adverse and/or side effects to prescribing provider.  Therapeutic Interventions: 1 on 1 counseling sessions, Psychoeducation, Medication administration, Evaluate responses to treatment, Monitor vital signs and CBGs as ordered, Perform/monitor CIWA, COWS, AIMS and Fall Risk screenings as ordered, Perform wound care treatments as ordered.  Evaluation of Outcomes: Progressing   LCSW Treatment Plan for Primary Diagnosis:  Bipolar 1 disorder, mixed, moderate (HCC) Long Term Goal(s): Safe transition to appropriate next level of care at discharge, Engage patient in therapeutic group addressing interpersonal concerns.  Short Term Goals: Engage patient in aftercare planning with referrals and resources, Increase ability to appropriately verbalize feelings, Facilitate acceptance of mental health diagnosis and concerns, and Identify triggers associated with mental health/substance abuse issues  Therapeutic Interventions: Assess for all discharge needs, 1 to 1 time with Social worker, Explore available resources and support systems, Assess for adequacy in community support network, Educate family and significant other(s) on suicide prevention, Complete Psychosocial Assessment, Interpersonal group therapy.  Evaluation of Outcomes: Progressing   Progress in Treatment: Attending groups: Yes. Participating in groups: Yes. Taking medication as prescribed: Yes. Toleration medication: Yes. Family/Significant other contact made: Yes. contacted:  Emberlynn Riggan (husband) [phone] and Vickie  Francia (mom) [phone]  Patient understands diagnosis: Yes. Discussing patient identified problems/goals with staff: Yes. Medical problems stabilized or resolved: Yes. Denies suicidal/homicidal ideation: No. HI and SI. HI towards roommate and others on the unit  Issues/concerns per patient self-inventory: No.   New problem(s) identified: Yes, Describe:  pt requesting a room on [address] hall, constant supervision or to spend time in the quiet room   New Short Term/Long Term Goal(s): medication stabilization, elimination of SI thoughts, development of comprehensive mental wellness plan.      Patient Goals:  Get out of the manic state, lessen the intrusive thoughts of wanting to choke out my roommate, and increase my sleep   Discharge Plan or Barriers: Patient recently admitted. CSW will continue to follow and assess for appropriate  referrals and possible discharge planning.      Reason for Continuation of Hospitalization: Homicidal ideation Medication stabilization Suicidal ideation Other; describe bipolar   Estimated Length of Stay: 3 - 4 days  Last 3 [place] Suicide Severity Risk Score: Flowsheet Row Ad[facility] (Current) [facility] Most recent reading at 07/04/2024 10:27 PM ED from 07/04/2024 in [facility] Most recent reading at 07/04/2024  3:43 PM Admission (Discharged) [facility] Most recent reading at 12/09/2023  3:30 PM  C-SSRS RISK CATEGORY Moderate Risk Moderate Risk No Risk    Last PHQ 2/9 Scores:    08/10/2022    7:45 PM  Depression screen PHQ 2/9  Decreased Interest 3  Down, Depressed, Hopeless 3  PHQ - 2 Score 6  Altered sleeping 3  Tired, decreased energy 1  Change in appetite 3  Feeling bad or failure about yourself  1  Trouble concentrating 2  Moving slowly or fidgety/restless 2  Suicidal thoughts 2  PHQ-9 Score 20   Difficult doing work/chores Very difficult     Data saved with a previous flowsheet row definition    Scribe for Treatment Team: Anai Lipson O [place], LCSWA 07/14/2024 2:41 PM

## 2024-07-14 NOTE — BH IP Treatment Plan (Addendum)
 Interdisciplinary Treatment and Diagnostic Plan Update  07/14/2024 Time of Session: 8:55 AM Kara Stephens MRN: [id number]  Principal Diagnosis: Bipolar 1 disorder, mixed, moderate (HCC)  Secondary Diagnoses: Principal Problem:   Bipolar 1 disorder, mixed, moderate (HCC) Active Problems:   Obsessive compulsive disorder   Current Medications:  Current Facility-Administered Medications  Medication Dose Route Frequency Provider Last Rate Last Admin   acetaminophen  (TYLENOL ) tablet 650 mg  650 mg Oral Q6H PRN McLauchlin, Angela, NP   650 mg at 07/08/24 1818   alum & mag hydroxide-simeth (MAALOX/MYLANTA) 200-200-20 MG/5ML suspension 30 mL  30 mL Oral Q4H PRN McLauchlin, Angela, NP       clomiPRAMINE  (ANAFRANIL ) capsule 50 mg  50 mg Oral BID Butler, Laura N, MD   50 mg at 07/14/24 1705   clonazePAM  (KLONOPIN ) disintegrating tablet 0.5 mg  0.5 mg Oral QHS Towana Leita SAILOR, MD   0.5 mg at 07/13/24 2042   clonazePAM  (KLONOPIN ) tablet 0.5 mg  0.5 mg Oral BID PRN Towana Leita SAILOR, MD   0.5 mg at 07/13/24 1132   haloperidol  (HALDOL ) tablet 5 mg  5 mg Oral Q6H PRN Towana Leita SAILOR, MD       Or   haloperidol  lactate (HALDOL ) injection 5 mg  5 mg Intramuscular Q6H PRN Butler, Laura N, MD   5 mg at 07/06/24 1508   hydrOXYzine  (ATARAX ) tablet 25 mg  25 mg Oral TID PRN Pashayan, Alexander S, DO   25 mg at 07/14/24 1124   lamoTRIgine  (LAMICTAL ) tablet 250 mg  250 mg Oral Daily Towana Leita SAILOR, MD   250 mg at 07/14/24 9158   magnesium  hydroxide (MILK OF MAGNESIA) suspension 30 mL  30 mL Oral Daily PRN McLauchlin, Angela, NP       melatonin tablet 3 mg  3 mg Oral QHS Towana Leita SAILOR, MD   3 mg at 07/13/24 2042   memantine  (NAMENDA ) tablet 10 mg  10 mg Oral BID Butler, Laura N, MD   10 mg at 07/14/24 1705   metFORMIN  (GLUCOPHAGE ) tablet 500 mg  500 mg Oral Q breakfast Towana Leita SAILOR, MD   500 mg at 07/14/24 9157   propranolol  ER (INDERAL  LA) 24 hr capsule 60 mg  60 mg Oral Daily Towana Leita SAILOR, MD   60 mg  at 07/14/24 9157   QUEtiapine  (SEROQUEL  XR) 24 hr tablet 300 mg  300 mg Oral QHS Towana Leita SAILOR, MD   300 mg at 07/13/24 2042   traZODone  (DESYREL ) tablet 50 mg  50 mg Oral QHS Butler, Laura N, MD   50 mg at 07/13/24 2042   PTA Medications: Medications Prior to Admission  Medication Sig Dispense Refill Last Dose/Taking   albuterol  (VENTOLIN  HFA) 108 (90 Base) MCG/ACT inhaler Inhale 2 puffs into the lungs every 4 (four) hours as needed for wheezing or shortness of breath (cough, shortness of breath or wheezing.). (Patient not taking: Reported on 07/04/2024) 6.7 g 3    aspirin EC 81 MG tablet Take 81 mg by mouth daily. Swallow whole.      Cholecalciferol  (VITAMIN D -3 PO) Take 1 capsule by mouth daily.      clonazePAM  (KLONOPIN ) 0.5 MG tablet Take 0.5 mg by mouth daily as needed for anxiety.      lamoTRIgine  (LAMICTAL ) 200 MG tablet Take 200 mg by mouth daily.      memantine  (NAMENDA ) 10 MG tablet Take 1 tablet (10 mg total) by mouth every 12 (twelve) hours. 60 tablet 0  metFORMIN  (GLUCOPHAGE -XR) 500 MG 24 hr tablet Take 1 tablet (500 mg total) by mouth daily with breakfast. (Patient taking differently: Take 1,000 mg by mouth daily with breakfast.) 30 tablet 0    Multiple Vitamin (MULTIVITAMIN WITH MINERALS) TABS tablet Take 1 tablet by mouth daily.      propranolol  ER (INDERAL  LA) 60 MG 24 hr capsule Take 1 capsule (60 mg total) by mouth daily. 30 capsule 0    QUEtiapine  (SEROQUEL ) 200 MG tablet Take 200 mg by mouth at bedtime.      traZODone  (DESYREL ) 100 MG tablet Take 1 tablet (100 mg total) by mouth at bedtime as needed for sleep. 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   Physician Treatment Plan for Primary Diagnosis: Bipolar 1 disorder, mixed, moderate (HCC) Long Term Goal(s):     Short Term Goals:    Medication Management: Evaluate patient's response, side effects, and tolerance of medication regimen.  Therapeutic Interventions: 1 to 1 sessions, Unit Group sessions and Medication administration.  Evaluation of Outcomes: Progressing  Physician Treatment Plan for Secondary Diagnosis: Principal Problem:   Bipolar 1 disorder, mixed, moderate (HCC) Active Problems:   Obsessive compulsive disorder  Long Term Goal(s):     Short Term Goals:       Medication Management: Evaluate patient's response, side effects, and tolerance of medication regimen.  Therapeutic Interventions: 1 to 1 sessions, Unit Group sessions and Medication administration.  Evaluation of Outcomes: Progressing   RN Treatment Plan for Primary Diagnosis: Bipolar 1 disorder, mixed, moderate (HCC) Long Term Goal(s): Knowledge of disease and therapeutic regimen to maintain health will improve  Short Term Goals: Ability to remain free from injury will improve, Ability to verbalize frustration and anger appropriately will improve, Ability to verbalize feelings will improve, and Ability to disclose and discuss suicidal ideas  Medication Management: RN will administer medications as ordered by provider, will assess and evaluate patient's response and provide education to patient for prescribed medication. RN will report any adverse and/or side effects to prescribing provider.  Therapeutic Interventions: 1 on 1 counseling sessions, Psychoeducation, Medication administration, Evaluate responses to treatment, Monitor vital signs and CBGs as ordered, Perform/monitor CIWA, COWS, AIMS and Fall Risk screenings as ordered, Perform wound care treatments as ordered.  Evaluation of Outcomes: Progressing   LCSW Treatment Plan for Primary Diagnosis:  Bipolar 1 disorder, mixed, moderate (HCC) Long Term Goal(s): Safe transition to appropriate next level of care at discharge, Engage patient in therapeutic group addressing interpersonal concerns.  Short Term Goals: Engage patient in aftercare planning with referrals and resources, Increase ability to appropriately verbalize feelings, Facilitate acceptance of mental health diagnosis and concerns, and Identify triggers associated with mental health/substance abuse issues  Therapeutic Interventions: Assess for all discharge needs, 1 to 1 time with Social worker, Explore available resources and support systems, Assess for adequacy in community support network, Educate family and significant other(s) on suicide prevention, Complete Psychosocial Assessment, Interpersonal group therapy.  Evaluation of Outcomes: Progressing   Progress in Treatment: Attending groups: Yes. Participating in groups: Yes. Taking medication as prescribed: Yes. Toleration medication: Yes. Family/Significant other contact made: Yes. contacted:  Carlia Bomkamp (husband) [phone] and Vickie Sexton (mom) [phone]  Patient understands diagnosis: Yes. Discussing patient identified problems/goals with staff: Yes. Medical problems stabilized or resolved: Yes. Denies suicidal/homicidal ideation: No. HI and SI. HI towards roommate and others on the unit  Issues/concerns per patient self-inventory: No.   New problem(s) identified: Yes, Describe:  pt requesting a room on 500 hall, constant supervision or to spend time in the quiet room   New Short Term/Long Term Goal(s): medication stabilization, elimination of SI thoughts, development of comprehensive mental wellness plan.      Patient Goals:  Get out of the manic state, lessen the intrusive thoughts of wanting to choke out my roommate, and increase my sleep   Discharge Plan or Barriers: Patient recently admitted. CSW will continue to follow and assess for appropriate  referrals and possible discharge planning.      Reason for Continuation of Hospitalization: Homicidal ideation Medication stabilization Suicidal ideation Other; describe bipolar   Estimated Length of Stay: 2 - 3 days  Last 3 [place] Suicide Severity Risk Score: Flowsheet Row Ad[facility]Current[facility] Most recent reading at 07/04/2024 10:27 PM ED from 07/04/2024 in [facility] Most recent reading at 07/04/2024  3:43 PM Admission (Discharged) [facility] Most recent reading at 12/09/2023  3:30 PM  C-SSRS RISK CATEGORY Moderate Risk Moderate Risk No Risk    Last PHQ 2/9 Scores:    08/10/2022    7:45 PM  Depression screen PHQ 2/9  Decreased Interest 3  Down, Depressed, Hopeless 3  PHQ - 2 Score 6  Altered sleeping 3  Tired, decreased energy 1  Change in appetite 3  Feeling bad or failure about yourself  1  Trouble concentrating 2  Moving slowly or fidgety/restless 2  Suicidal thoughts 2  PHQ-9 Score 20   Difficult doing work/chores Very difficult     Data saved with a previous flowsheet row definition    Scribe for Treatment Team: Shameka Aggarwal O Kaiyla Stahly, LCSWA 07/14/2024 6:22 PM

## 2024-07-14 NOTE — Progress Notes (Addendum)
 [facility] MD Progress Note  07/14/2024 4:49 PM Kara Stephens  MRN:  [id number] Subjective:   Kara Stephens is a 38 yr old female who presented on 10/28 to [facility] due to intrusive Suicidal Thoughts and Homicidal Thoughts, she was admitted to [facility] on 10/29.  PPHx is significant for Bipolar Disorder, OCD, and Cluster B traits, and 4 Prior Psychiatric Hospitalizations (last [facility] 12/2023), and no history of Suicide Attempts.   Case was discussed in the multidisciplinary team. MAR was reviewed and patient was compliant with medications.  Yesterday she received PRN Klonopin .   Psychiatric Team made the following recommendations yesterday: -Continue Clomipramine  50 mg BID for OCD  -Continue Lamictal  250 mg daily for mood stability -Continue Seroquel  XR 300 mg QHS for mood stability and sleep -Continue Namenda  10 mg BID for reported intrusive thoughts (patient takes at home and reports benefit)  -Continue Trazodone  50 mg QHS    On interview today patient reports she slept good last night.  She reports her appetite is doing good.  She reports no SI, HI, or AVH.  She reports continued intrusive ego-dystonic thoughts but that they are becoming less intense.  She reports no Paranoia or Ideas of Reference.  She reports no issues with her medications.  Discussed with her that this was promising that she is beginning to have a reduction in intensity of her ego-dystonic thoughts as well as increased sleep.  Discussed with her that since she is showing improvements we will keep things where they are to avoid side effects from increased doses and she was agreeable with this.  She reports no concerns at present.   Principal Problem: Bipolar 1 disorder, mixed, moderate (HCC) Diagnosis: Principal Problem:   Bipolar 1 disorder, mixed, moderate (HCC) Active Problems:   Obsessive compulsive disorder  Total Time spent with patient:  I personally spent 35 minutes on the unit in direct patient care. The direct patient  care time included face-to-face time with the patient, reviewing the patient's chart, communicating with other professionals, and coordinating care.    Past Psychiatric History:  Bipolar Disorder, OCD, and Cluster B traits, and 4 Prior Psychiatric Hospitalizations (last [facility] 12/2023), and no history of Suicide Attempts.  Past Medical History:  Past Medical History:  Diagnosis Date   Anxiety    Back spasm    Bipolar I disorder (HCC)    Borderline personality disorder (HCC)    Class 3 obesity (HCC)    Cluster B personality disorder (HCC)    Hypercholesterolemia    Hypertension    Hypertriglyceridemia    Somatic symptom disorder    Tachycardia    Type 2 diabetes mellitus (HCC)    Vitamin D  insufficiency     Past Surgical History:  Procedure Laterality Date   APPENDECTOMY     CHOLECYSTECTOMY     HAND SURGERY     SHOULDER SURGERY     Family History:  Family History  Problem Relation Age of Onset   Hypertension Mother    Hyperlipidemia Mother    Suicidality Father    Depression Father    Family Psychiatric  History:  Father- Depression, Suicidality   Social History:  Social History   Substance and Sexual Activity  Alcohol Use No     Social History   Substance and Sexual Activity  Drug Use No    Social History   Socioeconomic History   Marital status: Married    Spouse name: Not on file   Number of children: Not on  file   Years of education: Not on file   Highest education level: Not on file  Occupational History   Not on file  Tobacco Use   Smoking status: Every Day    Current packs/day: 0.50    Average packs/day: 0.5 packs/day for 5.7 years (2.8 ttl pk-yrs)    Types: Cigarettes    Start date: 11/2018    Last attempt to quit: 09/10/2016   Smokeless tobacco: Never  Vaping Use   Vaping status: Never Used  Substance and Sexual Activity   Alcohol use: No   Drug use: No   Sexual activity: Not on file  Other Topics Concern   Not on file  Social History  Narrative   Not on file   Social Drivers of Health   Financial Resource Strain: Not on file  Food Insecurity: Food Insecurity Present (07/04/2024)   Hunger Vital Sign    Worried About Running Out of Food in the Last Year: Sometimes true    Ran Out of Food in the Last Year: Never true  Transportation Needs: No Transportation Needs (07/04/2024)   PRAPARE - Administrator, Civil Service (Medical): No    Lack of Transportation (Non-Medical): No  Physical Activity: Not on file  Stress: Stress Concern Present (11/16/2023)   Received from [facility] of Occupational Health - Occupational Stress Questionnaire    Feeling of Stress : Very much  Social Connections: Unknown (01/10/2022)   Received from [facility]   Social Network    Social Network: Not on file   Additional Social History:                         Sleep: Fair per staff but patient reports poor Estimated Sleeping Duration (Last 24 Hours): 6.75-8.00 hours (Due to [employer] Time, the durations displayed may not accurately represent documentation during the time change interval)  Appetite:  Good  Current Medications: Current Facility-Administered Medications  Medication Dose Route Frequency Provider Last Rate Last Admin   acetaminophen  (TYLENOL ) tablet 650 mg  650 mg Oral Q6H PRN McLauchlin, Angela, NP   650 mg at 07/08/24 1818   alum & mag hydroxide-simeth (MAALOX/MYLANTA) 200-200-20 MG/5ML suspension 30 mL  30 mL Oral Q4H PRN McLauchlin, Angela, NP       clomiPRAMINE  (ANAFRANIL ) capsule 50 mg  50 mg Oral BID Towana Leita SAILOR, MD   50 mg at 07/14/24 9157   clonazePAM  (KLONOPIN ) disintegrating tablet 0.5 mg  0.5 mg Oral QHS Towana Leita SAILOR, MD   0.5 mg at 07/13/24 2042   clonazePAM  (KLONOPIN ) tablet 0.5 mg  0.5 mg Oral BID PRN Towana Leita SAILOR, MD   0.5 mg at 07/13/24 1132   haloperidol  (HALDOL ) tablet 5 mg  5 mg Oral Q6H PRN Towana Leita SAILOR, MD       Or   haloperidol  lactate  (HALDOL ) injection 5 mg  5 mg Intramuscular Q6H PRN Towana Leita SAILOR, MD   5 mg at 07/06/24 1508   hydrOXYzine  (ATARAX ) tablet 25 mg  25 mg Oral TID PRN Quartez Lagos S, DO   25 mg at 07/14/24 1124   lamoTRIgine  (LAMICTAL ) tablet 250 mg  250 mg Oral Daily Towana Leita SAILOR, MD   250 mg at 07/14/24 0841   magnesium  hydroxide (MILK OF MAGNESIA) suspension 30 mL  30 mL Oral Daily PRN McLauchlin, Angela, NP       melatonin tablet 3 mg  3  mg Oral QHS Butler, Laura N, MD   3 mg at 07/13/24 2042   memantine  (NAMENDA ) tablet 10 mg  10 mg Oral BID Butler, Laura N, MD   10 mg at 07/14/24 0841   metFORMIN  (GLUCOPHAGE ) tablet 500 mg  500 mg Oral Q breakfast Towana Leita SAILOR, MD   500 mg at 07/14/24 9157   propranolol  ER (INDERAL  LA) 24 hr capsule 60 mg  60 mg Oral Daily Butler, Laura N, MD   60 mg at 07/14/24 9157   QUEtiapine  (SEROQUEL  XR) 24 hr tablet 300 mg  300 mg Oral QHS Butler, Laura N, MD   300 mg at 07/13/24 2042   traZODone  (DESYREL ) tablet 50 mg  50 mg Oral QHS Butler, Laura N, MD   50 mg at 07/13/24 2042    Lab Results: No results found for this or any previous visit (from the past 48 hours).  Blood Alcohol level:  Lab Results  Component Value Date   [facility] <15 07/04/2024   ETH <10 08/10/2022    Metabolic Disorder Labs: Lab Results  Component Value Date   HGBA1C 5.4 07/04/2024   MPG 108.28 07/04/2024   MPG 119.76 12/07/2023   No results found for: PROLACTIN Lab Results  Component Value Date   CHOL 223 (H) 07/04/2024   TRIG 100 07/04/2024   HDL 53 07/04/2024   CHOLHDL 4.2 07/04/2024   VLDL 20 07/04/2024   LDLCALC 150 (H) 07/04/2024   LDLCALC 92 12/07/2023    Physical Findings: AIMS:  ,  ,  ,  ,  ,  ,   CIWA:    COWS:     Musculoskeletal: Strength & Muscle Tone: within normal limits Gait & Station: normal Patient leans: N/A  Psychiatric Specialty Exam:  Presentation  General Appearance:  Appropriate for Environment; Casual  Eye Contact: Good  Speech: Clear  and Coherent; Normal Rate  Speech Volume: Normal  Handedness: Right   Mood and Affect  Mood: Dysphoric; Anxious  Affect: Congruent   Thought Process  Thought Processes: Coherent  Descriptions of Associations:Intact  Orientation:Full (Time, Place and Person)  Thought Content:Logical; WDL  History of Schizophrenia/Schizoaffective disorder:No  Duration of Psychotic Symptoms:N/A  Hallucinations:Hallucinations: None  Ideas of Reference:None  Suicidal Thoughts:Suicidal Thoughts: No  Homicidal Thoughts:Homicidal Thoughts: -- (Continues to have ego-dystonic intrusive thoughts)   Sensorium  Memory: Immediate Fair  Judgment: Intact  Insight: Present   Executive Functions  Concentration: Fair  Attention Span: Fair  Recall: [employer] of Knowledge: Fair  Language: Fair   Psychomotor Activity  Psychomotor Activity:Psychomotor Activity: Normal   Assets  Assets: Manufacturing Systems Engineer; Resilience; Desire for Improvement   Sleep  Sleep:Sleep: Good    Physical Exam: Physical Exam Vitals and nursing note reviewed.  Constitutional:      General: She is not in acute distress.    Appearance: Normal appearance. She is normal weight. She is not ill-appearing or toxic-appearing.  HENT:     Head: Normocephalic and atraumatic.  Pulmonary:     Effort: Pulmonary effort is normal.  Musculoskeletal:        General: Normal range of motion.  Neurological:     General: No focal deficit present.     Mental Status: She is alert.    Review of Systems  Respiratory:  Negative for cough and shortness of breath.   Cardiovascular:  Negative for chest pain.  Gastrointestinal:  Negative for abdominal pain, constipation, diarrhea, nausea and vomiting.  Neurological:  Negative for dizziness, weakness and  headaches.  Psychiatric/Behavioral:  Positive for depression. Negative for hallucinations and suicidal ideas. The patient is nervous/anxious.    Blood  pressure 123/72, pulse 76, temperature 98.1 F (36.7 C), temperature source Oral, resp. rate 18, height 5' 6 (1.676 m), weight 106.7 kg, SpO2 98%. Body mass index is 37.96 kg/m.   Treatment Plan Summary: Daily contact with patient to assess and evaluate symptoms and progress in treatment and Medication management  Kara Stephens is a 38 yr old female who presented on 10/28 to [facility] due to intrusive Suicidal Thoughts and Homicidal Thoughts, she was admitted to [facility] on 10/29.  PPHx is significant for Bipolar Disorder, OCD, and Cluster B traits, and 4 Prior Psychiatric Hospitalizations (last [facility] 12/2023), and no history of Suicide Attempts.    Kara Stephens is continuing to have the ego dystonic intrusive thoughts, however, she is reporting that they are slightly less intense today.  She is reporting continued improvement in her sleep which she again reports as usually the first sign that she is improving.  As she is continuing to improve we will not make any changes to her medications at this time.  Social work reports mother plans to come and have patient stay with her and so if this can be arranged we could discharge on Monday as even if she continues to have intrusive thoughts since they are improving and she would not be around her son or the pool it would be safe for her to continue care if we could arrange PHP or IOP.  We will continue to monitor.   Bipolar I disorder, current episode mixed, no current psychotic features  OCD : -Continue Clomipramine  50 mg BID for OCD -Continue Lamictal  250 mg daily for mood stability -Continue Seroquel  XR 300 mg QHS for mood stability and sleep -Continue Namenda  10 mg BID for reported intrusive thoughts (patient takes at home and reports benefit)  -Continue Trazodone  50 mg QHS -Continue Klonopin  0.5 mg BID PRN for anxiety + scheduled 0.5 mg at bedtime for sleep              -patient notified klonopin  will not be continued after hospital and to reserve for  severe anxiety only -Continue Agitation Protocol: Haldol /Ativan /Benadryl    -Continue Metformin  500 mg daily -Continue Propanolol ER 60 mg daily -Continue Melatonin 3 mg QHS -Continue PRN's: Tylenol , Maalox, Milk of Magnesia   --  The risks/benefits/side-effects/alternatives to medications were discussed in detail with the patient and time was given for questions. The patient consents to medication trials.                -- Metabolic profile and EKG monitoring obtained while on an atypical antipsychotic (BMI: 37.96  Lipid Panel: WNL except Chol: 223, LDL: 150 HbgA1c: 5.4  EKG: Sinus Rhythm w/ PVC w/QTc: 450)              -- Encouraged patient to participate in unit milieu and in scheduled group therapies              -- Short Term Goals: Ability to identify changes in lifestyle to reduce recurrence of condition will improve, Ability to verbalize feelings will improve, Ability to disclose and discuss suicidal ideas, Ability to demonstrate self-control will improve, Ability to identify and develop effective coping behaviors will improve, Ability to maintain clinical measurements within normal limits will improve, Compliance with prescribed medications will improve, and Ability to identify triggers associated with substance abuse/mental health issues will improve             --  Long Term Goals: Improvement in symptoms so as ready for discharge   Safety and Monitoring:             -- Voluntary admission to inpatient psychiatric unit for safety, stabilization and treatment             -- Daily contact with patient to assess and evaluate symptoms and progress in treatment             -- Patient's case to be discussed in multi-disciplinary team meeting             -- Observation Level : q15 minute checks             -- Vital signs:  q12 hours             -- Precautions: suicide, elopement, and assault  Discharge Planning:              -- Social work and case management to assist with discharge  planning and identification of hospital follow-up needs prior to discharge             -- Estimated LOS: 3-5 more days             -- Discharge Concerns: Need to establish a safety plan; Medication compliance and effectiveness             -- Discharge Goals: Return home with outpatient referrals for mental health follow-up including medication management/psychotherapy    Marsa Kara Rosser, DO 07/14/2024, 4:49 PM

## 2024-07-14 NOTE — B[facility] Group Notes (Signed)
 Adult Psychoeducational Group Note  Date:  07/14/2024 Time:  10:40 AM  Group Topic/Focus: Goals Goals Group:   The focus of this group is to help patients establish daily goals to achieve during treatment and discuss how the patient can incorporate goal setting into their daily lives to aide in recovery.  Participation Level:  Active  Participation Quality:  Appropriate  Affect:  Appropriate  Cognitive:  Appropriate  Insight: Appropriate  Engagement in Group:  Engaged  Modes of Intervention:  Discussion  Additional Comments:  the patient engaged in group  Weber Monnier Lee 07/14/2024, 10:40 AM

## 2024-07-14 NOTE — [facility] Group Notes (Signed)
 Adult Psychoeducational Group Note  Date:  07/14/2024 Time:  10:37 AM  Group Topic/Focus: Goals Goals Group:   The focus of this group is to help patients establish daily goals to achieve during treatment and discuss how the patient can incorporate goal setting into their daily lives to aide in recovery.  Participation Level:  Active  Participation Quality:  Appropriate  Affect:  Appropriate  Cognitive:  Appropriate  Insight: Appropriate  Engagement in Group:  Engaged  Modes of Intervention:  Discussion  Additional Comments:  The patient engaged in group.  Kara Stephens 07/14/2024, 10:37 AM

## 2024-07-14 NOTE — [facility] Group Notes (Signed)
 Adult Psychoeducational Group Note  Date:  07/14/2024 Time:  3:43 PM  Group Topic/Focus:  Chaplain Group Building Self Esteem:   The Focus of this group is helping patients become aware of the effects of self-esteem on their lives, the things they and others do that enhance or undermine their self-esteem, seeing the relationship between their level of self-esteem and the choices they make and learning ways to enhance self-esteem.  Participation Level:  na  Participation Quality:  na  Affect:  na  Cognitive:  na  Insight: na  Engagement in Group:  na  Modes of Intervention:  na  Additional Comments:  attended group  Kara Stephens 07/14/2024, 3:43 PM

## 2024-07-14 NOTE — Plan of Care (Signed)
   Problem: Education: Goal: Knowledge of Kara Stephens General Education information/materials will improve Outcome: Progressing Goal: Emotional status will improve Outcome: Progressing Goal: Mental status will improve Outcome: Progressing Goal: Verbalization of understanding the information provided will improve Outcome: Progressing   Problem: Activity: Goal: Interest or engagement in activities will improve Outcome: Progressing Goal: Sleeping patterns will improve Outcome: Progressing   Problem: Coping: Goal: Ability to verbalize frustrations and anger appropriately will improve Outcome: Progressing Goal: Ability to demonstrate self-control will improve Outcome: Progressing

## 2024-07-14 NOTE — Group Note (Signed)
 Date:  07/14/2024 Time:  1:44 PM  Group Topic/Focus:  Building Self Esteem:   The Focus of this group is helping patients become aware of the effects of self-esteem on their lives, the things they and others do that enhance or undermine their self-esteem, seeing the relationship between their level of self-esteem and the choices they make and learning ways to enhance self-esteem. Emotional Education:   The focus of this group is to discuss what feelings/emotions are, and how they are experienced.    Participation Level:  Did Not Attend   Meg FORBES Ellen 07/14/2024, 1:44 PM

## 2024-07-15 DIAGNOSIS — F3162 Bipolar disorder, current episode mixed, moderate: Secondary | ICD-10-CM | POA: Diagnosis not present

## 2024-07-15 DIAGNOSIS — F429 Obsessive-compulsive disorder, unspecified: Secondary | ICD-10-CM | POA: Diagnosis not present

## 2024-07-15 MED ORDER — HALOPERIDOL 2 MG PO TABS
2.0000 mg | ORAL_TABLET | Freq: Two times a day (BID) | ORAL | Status: DC
  Administered 2024-07-15 – 2024-07-19 (×8): 2 mg via ORAL
  Filled 2024-07-15 (×8): qty 1

## 2024-07-15 NOTE — Progress Notes (Signed)
 Adult Psychoeducational Group Note  Date:  07/15/2024 Time:  9:07 AM  Group Topic/Focus:  Goals Group:   The focus of this group is to help patients establish daily goals to achieve during treatment and discuss how the patient can incorporate goal setting into their daily lives to aide in recovery.  Participation Level:  Active  Participation Quality:  Appropriate  Affect:  Appropriate  Cognitive:  Appropriate  Insight: Appropriate  Engagement in Group:  Engaged  Modes of Intervention:  Discussion  Additional Comments:  Pt stated her goal for the day is to talk to doctor about her medicine and the symptoms she is having.  Daine Pillar D 07/15/2024, 9:07 AM

## 2024-07-15 NOTE — Progress Notes (Addendum)
 [facility] MD Progress Note  07/15/2024 2:51 PM ANJOLI DIEMER  MRN:  [id number] Subjective:   Kara Stephens is a 38 yr old female who presented on 10/28 to [facility] due to intrusive Suicidal Thoughts and Homicidal Thoughts, she was admitted to [facility] on 10/29.  PPHx is significant for Bipolar Disorder, OCD, and Cluster B traits, and 4 Prior Psychiatric Hospitalizations (last [facility] 12/2023), and no history of Suicide Attempts.   Case was discussed in the multidisciplinary team. MAR was reviewed and patient was compliant with medications.  She received PRN Klonopin  yesterday.   Psychiatric Team made the following recommendations yesterday: -Increase Clomipramine  to 50 mg BID for OCD  -Continue Lamictal  250 mg daily for mood stability -Continue Seroquel  XR 300 mg QHS for mood stability and sleep -Continue Namenda  10 mg BID for reported intrusive thoughts (patient takes at home and reports benefit)  -Continue Trazodone  50 mg QHS    On interview today patient reports she slept fair last night.  She reports her appetite is doing good.  She reports no SI.  She reports continuing to have ego-dystonic intrusive thoughts.  She reports hearing an intrusive voice to hurt her son.  She reports occasionally seeing signs double.  She reports no Paranoia or Ideas of Reference.  She reports no issues with her medications.  She reports that she would like provider to contact both her husband and mother.  After discussion with family (see below), came back and discussed Haldol .  She reports she has never had a negative reaction to Haldol  and is agreeable to trialing it.  She reports no other concerns at present.    Called patient's husband, Loanne Emery, [phone]. He reports that the patient gets worse when ever her Seroquel  is changed during past hospitalizations.  He reports concern that she is not showing improvement other than her sleep is improving which in the past did mean she was getting better.  He  reports that patient's father had Bipolar Disorder and completed Suicide.  He reports no other concerns at present.   Called patient's mother, Orie Goldberg, [phone]  She reports that this is different than any previous episode that the patient has had.  She voices concerns about patient minimizing her symptoms due to fear that her son may be taken from them.  She reports that patient has told her about having urges to hurt herself and others.  She reports no other concerns at present.   Principal Problem: Bipolar 1 disorder, mixed, moderate (HCC) Diagnosis: Principal Problem:   Bipolar 1 disorder, mixed, moderate (HCC) Active Problems:   Obsessive compulsive disorder  Total Time spent with patient:  I personally spent 60 minutes on the unit in direct patient care. The direct patient care time included face-to-face time with the patient, reviewing the patient's chart, communicating with other professionals, and coordinating care.    Past Psychiatric History:  Bipolar Disorder, OCD, and Cluster B traits, and 4 Prior Psychiatric Hospitalizations (last [facility] 12/2023), and no history of Suicide Attempts.  Past Medical History:  Past Medical History:  Diagnosis Date   Anxiety    Back spasm    Bipolar I disorder (HCC)    Borderline personality disorder (HCC)    Class 3 obesity (HCC)    Cluster B personality disorder (HCC)    Hypercholesterolemia    Hypertension    Hypertriglyceridemia    Somatic symptom disorder    Tachycardia    Type 2 diabetes mellitus (HCC)    Vitamin D   insufficiency     Past Surgical History:  Procedure Laterality Date   APPENDECTOMY     CHOLECYSTECTOMY     HAND SURGERY     SHOULDER SURGERY     Family History:  Family History  Problem Relation Age of Onset   Hypertension Mother    Hyperlipidemia Mother    Suicidality Father    Depression Father    Family Psychiatric  History:  Father- Bipolar Disorder, Completed Suicide    Social History:   Social History   Substance and Sexual Activity  Alcohol Use No     Social History   Substance and Sexual Activity  Drug Use No    Social History   Socioeconomic History   Marital status: Married    Spouse name: Not on file   Number of children: Not on file   Years of education: Not on file   Highest education level: Not on file  Occupational History   Not on file  Tobacco Use   Smoking status: Every Day    Current packs/day: 0.50    Average packs/day: 0.5 packs/day for 5.7 years (2.8 ttl pk-yrs)    Types: Cigarettes    Start date: 11/2018    Last attempt to quit: 09/10/2016   Smokeless tobacco: Never  Vaping Use   Vaping status: Never Used  Substance and Sexual Activity   Alcohol use: No   Drug use: No   Sexual activity: Not on file  Other Topics Concern   Not on file  Social History Narrative   Not on file   Social Drivers of Health   Financial Resource Strain: Not on file  Food Insecurity: Food Insecurity Present (07/04/2024)   Hunger Vital Sign    Worried About Running Out of Food in the Last Year: Sometimes true    Ran Out of Food in the Last Year: Never true  Transportation Needs: No Transportation Needs (07/04/2024)   PRAPARE - Administrator, Civil Service (Medical): No    Lack of Transportation (Non-Medical): No  Physical Activity: Not on file  Stress: Stress Concern Present (11/16/2023)   Received from [facility] - Occupational Stress Questionnaire    Feeling of Stress : Very much  Social Connections: Unknown (01/10/2022)   Received from [facility]   Social Network    Social Network: Not on file   Additional Social History:                         Sleep: Fair Estimated Sleeping Duration (Last 24 Hours): 7.25-8.50 hours (Due to [employer] Time, the durations displayed may not accurately represent documentation during the time change interval)  Appetite:  Good  Current  Medications: Current Facility-Administered Medications  Medication Dose Route Frequency Provider Last Rate Last Admin   acetaminophen  (TYLENOL ) tablet 650 mg  650 mg Oral Q6H PRN McLauchlin, Angela, NP   650 mg at 07/08/24 1818   alum & mag hydroxide-simeth (MAALOX/MYLANTA) 200-200-20 MG/5ML suspension 30 mL  30 mL Oral Q4H PRN McLauchlin, Angela, NP       clomiPRAMINE  (ANAFRANIL ) capsule 50 mg  50 mg Oral BID Towana Leita SAILOR, MD   50 mg at 07/15/24 9165   clonazePAM  (KLONOPIN ) disintegrating tablet 0.5 mg  0.5 mg Oral QHS Towana Leita SAILOR, MD   0.5 mg at 07/14/24 2039   clonazePAM  (KLONOPIN ) tablet 0.5 mg  0.5 mg Oral BID PRN Towana Leita  N, MD   0.5 mg at 07/13/24 1132   haloperidol  (HALDOL ) tablet 2 mg  2 mg Oral BID Kail Fraley S, DO       haloperidol  (HALDOL ) tablet 5 mg  5 mg Oral Q6H PRN Towana Leita SAILOR, MD       Or   haloperidol  lactate (HALDOL ) injection 5 mg  5 mg Intramuscular Q6H PRN Butler, Laura N, MD   5 mg at 07/06/24 1508   hydrOXYzine  (ATARAX ) tablet 25 mg  25 mg Oral TID PRN Liron Eissler S, DO   25 mg at 07/15/24 9162   lamoTRIgine  (LAMICTAL ) tablet 250 mg  250 mg Oral Daily Towana Leita SAILOR, MD   250 mg at 07/15/24 9166   magnesium  hydroxide (MILK OF MAGNESIA) suspension 30 mL  30 mL Oral Daily PRN McLauchlin, Angela, NP       melatonin tablet 3 mg  3 mg Oral QHS Butler, Laura N, MD   3 mg at 07/14/24 2040   memantine  (NAMENDA ) tablet 10 mg  10 mg Oral BID Butler, Laura N, MD   10 mg at 07/15/24 9165   metFORMIN  (GLUCOPHAGE ) tablet 500 mg  500 mg Oral Q breakfast Towana Leita SAILOR, MD   500 mg at 07/15/24 9165   propranolol  ER (INDERAL  LA) 24 hr capsule 60 mg  60 mg Oral Daily Butler, Laura N, MD   60 mg at 07/15/24 9165   QUEtiapine  (SEROQUEL  XR) 24 hr tablet 300 mg  300 mg Oral QHS Butler, Laura N, MD   300 mg at 07/14/24 2039   traZODone  (DESYREL ) tablet 50 mg  50 mg Oral QHS Butler, Laura N, MD   50 mg at 07/14/24 2040    Lab Results: No results found for  this or any previous visit (from the past 48 hours).  Blood Alcohol level:  Lab Results  Component Value Date   [facility] <15 07/04/2024   ETH <10 08/10/2022    Metabolic Disorder Labs: Lab Results  Component Value Date   HGBA1C 5.4 07/04/2024   MPG 108.28 07/04/2024   MPG 119.76 12/07/2023   No results found for: PROLACTIN Lab Results  Component Value Date   CHOL 223 (H) 07/04/2024   TRIG 100 07/04/2024   HDL 53 07/04/2024   CHOLHDL 4.2 07/04/2024   VLDL 20 07/04/2024   LDLCALC 150 (H) 07/04/2024   LDLCALC 92 12/07/2023    Physical Findings: AIMS:  ,  ,  ,  ,  ,  ,   CIWA:    COWS:     Musculoskeletal: Strength & Muscle Tone: within normal limits Gait & Station: normal Patient leans: N/A  Psychiatric Specialty Exam:  Presentation  General Appearance:  Appropriate for Environment; Casual  Eye Contact: Good  Speech: Clear and Coherent; Normal Rate  Speech Volume: Normal  Handedness: Right   Mood and Affect  Mood: Anxious; Dysphoric  Affect: Depressed; Constricted   Thought Process  Thought Processes: Coherent  Descriptions of Associations:Intact  Orientation:Full (Time, Place and Person)  Thought Content:Logical; WDL  History of Schizophrenia/Schizoaffective disorder:No  Duration of Psychotic Symptoms:N/A  Hallucinations:Hallucinations: -- (hears voice saying to hurt her son)  Ideas of Reference:None  Suicidal Thoughts:Suicidal Thoughts: No  Homicidal Thoughts:Homicidal Thoughts: -- (Continues to have ego-dystonic intrusive thoughts)   Sensorium  Memory: Immediate Fair  Judgment: Intact  Insight: Present   Executive Functions  Concentration: Fair  Attention Span: Fair  Recall: [employer] of Knowledge: Fair  Language: Fair   Psychomotor  Activity  Psychomotor Activity:Psychomotor Activity: Normal   Assets  Assets: Communication Skills; Resilience; Desire for Improvement   Sleep  Sleep:Sleep:  Fair    Physical Exam: Physical Exam Vitals and nursing note reviewed.  Constitutional:      General: She is not in acute distress.    Appearance: Normal appearance. She is obese. She is not ill-appearing or toxic-appearing.  HENT:     Head: Normocephalic and atraumatic.  Pulmonary:     Effort: Pulmonary effort is normal.  Musculoskeletal:        General: Normal range of motion.  Neurological:     General: No focal deficit present.     Mental Status: She is alert.    Review of Systems  Respiratory:  Negative for cough and shortness of breath.   Cardiovascular:  Negative for chest pain.  Gastrointestinal:  Negative for abdominal pain, constipation, diarrhea, nausea and vomiting.  Neurological:  Negative for dizziness, weakness and headaches.  Psychiatric/Behavioral:  Positive for depression and hallucinations. Negative for suicidal ideas. The patient is nervous/anxious.    Blood pressure 110/78, pulse 84, temperature 98.7 F (37.1 C), temperature source Oral, resp. rate 20, height 5' 6 (1.676 m), weight 106.7 kg, SpO2 98%. Body mass index is 37.96 kg/m.   Treatment Plan Summary: Daily contact with patient to assess and evaluate symptoms and progress in treatment and Medication management  CALVARY DIFRANCO is a 38 yr old female who presented on 10/28 to [facility] due to intrusive Suicidal Thoughts and Homicidal Thoughts, she was admitted to [facility] on 10/29.  PPHx is significant for Bipolar Disorder, OCD, and Cluster B traits, and 4 Prior Psychiatric Hospitalizations (last [facility] 12/2023), and no history of Suicide Attempts.    Lark is reporting some worsening of her intrusive thoughts and AH related to hurting her son.  Since she has worsened in the past when her Seroquel  dose has been adjusted we will instead add a first gen- Haldol .  We will not make any other changes to her medications at this time.  We will continue to monitor.    Bipolar I disorder, current episode mixed, (r/  out psychotic features)  OCD : -Continue Clomipramine  50 mg BID for OCD -Continue Lamictal  250 mg daily for mood stability -Start Haldol  2 mg BID for psychosis and mood stability -Continue Seroquel  XR 300 mg QHS for mood stability and sleep -Continue Namenda  10 mg BID for reported intrusive thoughts (patient takes at home and reports benefit)  -Continue Trazodone  50 mg QHS -Continue Klonopin  0.5 mg BID PRN for anxiety + scheduled 0.5 mg at bedtime for sleep              -patient notified klonopin  will not be continued after hospital and to reserve for severe anxiety only -Continue Agitation Protocol: Haldol /Ativan /Benadryl    -Continue Metformin  500 mg daily -Continue Propanolol ER 60 mg daily -Continue Melatonin 3 mg QHS -Continue PRN's: Tylenol , Maalox, Milk of Magnesia   --  The risks/benefits/side-effects/alternatives to medications were discussed in detail with the patient and time was given for questions. The patient consents to medication trials.                -- Metabolic profile and EKG monitoring obtained while on an atypical antipsychotic (BMI: 37.96  Lipid Panel: WNL except Chol: 223, LDL: 150 HbgA1c: 5.4  EKG: Sinus Rhythm w/ PVC w/QTc: 450)              -- Encouraged patient to participate in unit  milieu and in scheduled group therapies              -- Short Term Goals: Ability to identify changes in lifestyle to reduce recurrence of condition will improve, Ability to verbalize feelings will improve, Ability to disclose and discuss suicidal ideas, Ability to demonstrate self-control will improve, Ability to identify and develop effective coping behaviors will improve, Ability to maintain clinical measurements within normal limits will improve, Compliance with prescribed medications will improve, and Ability to identify triggers associated with substance abuse/mental health issues will improve             -- Long Term Goals: Improvement in symptoms so as ready for  discharge   Safety and Monitoring:             -- Voluntary admission to inpatient psychiatric unit for safety, stabilization and treatment             -- Daily contact with patient to assess and evaluate symptoms and progress in treatment             -- Patient's case to be discussed in multi-disciplinary team meeting             -- Observation Level : q15 minute checks             -- Vital signs:  q12 hours             -- Precautions: suicide, elopement, and assault  Discharge Planning:              -- Social work and case management to assist with discharge planning and identification of hospital follow-up needs prior to discharge             -- Estimated LOS: 3-5 more days             -- Discharge Concerns: Need to establish a safety plan; Medication compliance and effectiveness             -- Discharge Goals: Return home with outpatient referrals for mental health follow-up including medication management/psychotherapy    Marsa GORMAN Rosser, DO 07/15/2024, 2:51 PM

## 2024-07-15 NOTE — Plan of Care (Signed)
   Problem: Activity: Goal: Interest or engagement in activities will improve Outcome: Progressing   Problem: Coping: Goal: Ability to demonstrate self-control will improve Outcome: Progressing   Problem: Safety: Goal: Periods of time without injury will increase Outcome: Progressing

## 2024-07-15 NOTE — Plan of Care (Signed)
 Patient alert and oriented. Denies SI & pain, while endorsing HI, AVH, pt shared c/o intrusive thoughts I need someone to advocate for me, the voices telling me to drown my 37yr old; put him the pool. I'm seeing people and objects which begin moving around/floating. Support and encouragement provided.  Routine safety checks conducted every 15 minutes.  Patient informed to notify staff with problems or concerns. No adverse drug reactions noted. Patient verbally contracts for safety at this time. Patient interacts well with others on the unit.  Patient remains safe at this time.  Problem: Education: Goal: Emotional status will improve Outcome: Not Progressing   Problem: Education: Goal: Mental status will improve Outcome: Not Progressing   Problem: Education: Goal: Verbalization of understanding the information provided will improve Outcome: Not Progressing   Problem: Health Behavior/Discharge Planning: Goal: Identification of resources available to assist in meeting health care needs will improve Outcome: Not Progressing   Problem: Safety: Goal: Periods of time without injury will increase Outcome: Progressing

## 2024-07-15 NOTE — Progress Notes (Signed)
(  Sleep Hours) -7.25 (Any PRNs that were needed, meds refused, or side effects to meds)- Hydroxyzine  (Any disturbances and when (visitation, over night)- None (Concerns raised by the patient)- Voices telling to kill my 38 yr old (SI/HI/AVH)- Denies SI & VH; endorses HI & AH

## 2024-07-16 DIAGNOSIS — F3162 Bipolar disorder, current episode mixed, moderate: Secondary | ICD-10-CM | POA: Diagnosis not present

## 2024-07-16 DIAGNOSIS — F429 Obsessive-compulsive disorder, unspecified: Secondary | ICD-10-CM | POA: Diagnosis not present

## 2024-07-16 MED ORDER — WHITE PETROLATUM EX OINT
TOPICAL_OINTMENT | CUTANEOUS | Status: AC
  Administered 2024-07-16: 1
  Filled 2024-07-16: qty 5

## 2024-07-16 NOTE — Progress Notes (Signed)
 [facility] MD Progress Note  07/16/2024 1:33 PM Kara Stephens  MRN:  [id number] Subjective:   Kara Stephens is a 38 yr old female who presented on 10/28 to [facility] due to intrusive Suicidal Thoughts and Homicidal Thoughts, she was admitted to [facility] on 10/29.  PPHx is significant for Bipolar Disorder, OCD, and Cluster B traits, and 4 Prior Psychiatric Hospitalizations (last [facility] 12/2023), and no history of Suicide Attempts.   Case was discussed in the multidisciplinary team. MAR was reviewed and patient was compliant with medications.  She received PRN Tylenol  and Hydroxyzine  x2 yesterday.   Psychiatric Team made the following recommendations yesterday: -Continue Clomipramine  50 mg BID for OCD  -Continue Lamictal  250 mg daily for mood stability -Start Haldol  2 mg BID for psychosis and mood stability -Continue Seroquel  XR 300 mg QHS for mood stability and sleep -Continue Namenda  10 mg BID for reported intrusive thoughts (patient takes at home and reports benefit)  -Continue Trazodone  50 mg QHS    On interview today patient reports she slept fair last night.  She reports her appetite is doing fair.  She reports she has been having some intrusive urges to hurt herself.  She reports continuing to have intrusive thoughts to hurt her son.  She reports worsening AH- telling her to hurt her son.  She reports no VH.  She reports no Paranoia or Ideas of Reference.  She reports no issues with her medications.  Discussed with her that we would plan to increase her Clomipramine  tomorrow to continue addressing her intrusive thoughts and she was agreeable.    Principal Problem: Bipolar 1 disorder, mixed, moderate (HCC) Diagnosis: Principal Problem:   Bipolar 1 disorder, mixed, moderate (HCC) Active Problems:   Obsessive compulsive disorder  Total Stephens spent with patient:  I personally spent 35 minutes on the unit in direct patient care. The direct patient care Stephens included face-to-face Stephens with the  patient, reviewing the patient'Stephens chart, communicating with other professionals, and coordinating care.    Past Psychiatric History:  Bipolar Disorder, OCD, and Cluster B traits, and 4 Prior Psychiatric Hospitalizations (last [facility] Stephens 12/2023), and no history of Suicide Attempts.  Past Medical History:  Past Medical History:  Diagnosis Date   Anxiety    Back spasm    Bipolar I disorder (HCC)    Borderline personality disorder (HCC)    Class 3 obesity (HCC)    Cluster B personality disorder (HCC)    Hypercholesterolemia    Hypertension    Hypertriglyceridemia    Somatic symptom disorder    Tachycardia    Type 2 diabetes mellitus (HCC)    Vitamin D  insufficiency     Past Surgical History:  Procedure Laterality Date   APPENDECTOMY     CHOLECYSTECTOMY     HAND SURGERY     SHOULDER SURGERY     Family History:  Family History  Problem Relation Age of Onset   Hypertension Mother    Hyperlipidemia Mother    Suicidality Father    Depression Father    Family Psychiatric  History:  Father- Bipolar Disorder, Completed Suicide    Social History:  Social History   Substance and Sexual Activity  Alcohol Use No     Social History   Substance and Sexual Activity  Drug Use No    Social History   Socioeconomic History   Marital status: Married    Spouse name: Not on file   Number of children: Not on file   Years of  education: Not on file   Highest education level: Not on file  Occupational History   Not on file  Tobacco Use   Smoking status: Every Day    Current packs/day: 0.50    Average packs/day: 0.5 packs/day for 5.7 years (2.8 ttl pk-yrs)    Types: Cigarettes    Start date: 11/2018    Last attempt to quit: 09/10/2016   Smokeless tobacco: Never  Vaping Use   Vaping status: Never Used  Substance and Sexual Activity   Alcohol use: No   Drug use: No   Sexual activity: Not on file  Other Topics Concern   Not on file  Social History Narrative   Not on file   Social  Drivers of Health   Financial Resource Strain: Not on file  Food Insecurity: Food Insecurity Present (07/04/2024)   Hunger Vital Sign    Worried About Running Out of Food in the Last Year: Sometimes true    Ran Out of Food in the Last Year: Never true  Transportation Needs: No Transportation Needs (07/04/2024)   PRAPARE - Administrator, Civil Service (Medical): No    Lack of Transportation (Non-Medical): No  Physical Activity: Not on file  Stress: Stress Concern Present (11/16/2023)   Received from [facility] - Occupational Stress Questionnaire    Feeling of Stress : Very much  Social Connections: Unknown (01/10/2022)   Received from [facility]   Social Network    Social Network: Not on file   Additional Social History:                         Sleep: Fair Estimated Sleeping Duration (Last 24 Hours): 7.00-7.75 hours (Due to [facility], the durations displayed may not accurately represent documentation during the Stephens change interval)  Appetite:  Fair  Current Medications: Current Facility-Administered Medications  Medication Dose Route Frequency Provider Last Rate Last Admin   acetaminophen  (TYLENOL ) tablet 650 mg  650 mg Oral Q6H PRN McLauchlin, Angela, NP   650 mg at 07/15/24 1709   alum & mag hydroxide-simeth (MAALOX/MYLANTA) 200-200-20 MG/5ML suspension 30 mL  30 mL Oral Q4H PRN McLauchlin, Angela, NP       clomiPRAMINE  (ANAFRANIL ) capsule 50 mg  50 mg Oral BID Towana Leita SAILOR, MD   50 mg at 07/16/24 0827   clonazePAM  (KLONOPIN ) disintegrating tablet 0.5 mg  0.5 mg Oral QHS Towana Leita SAILOR, MD   0.5 mg at 07/15/24 2039   clonazePAM  (KLONOPIN ) tablet 0.5 mg  0.5 mg Oral BID PRN Towana Leita SAILOR, MD   0.5 mg at 07/13/24 1132   haloperidol  (HALDOL ) tablet 2 mg  2 mg Oral BID Kara Lukasik S, DO   2 mg at 07/16/24 9173   haloperidol  (HALDOL ) tablet 5 mg  5 mg Oral Q6H PRN Towana Leita SAILOR, MD        Or   haloperidol  lactate (HALDOL ) injection 5 mg  5 mg Intramuscular Q6H PRN Butler, Laura N, MD   5 mg at 07/06/24 1508   hydrOXYzine  (ATARAX ) tablet 25 mg  25 mg Oral TID PRN Star Resler S, DO   25 mg at 07/16/24 9170   lamoTRIgine  (LAMICTAL ) tablet 250 mg  250 mg Oral Daily Towana Leita SAILOR, MD   250 mg at 07/16/24 0827   magnesium  hydroxide (MILK OF MAGNESIA) suspension 30 mL  30 mL Oral Daily PRN McLauchlin, Angela, NP  melatonin tablet 3 mg  3 mg Oral QHS Butler, Laura N, MD   3 mg at 07/15/24 2039   memantine  (NAMENDA ) tablet 10 mg  10 mg Oral BID Butler, Laura N, MD   10 mg at 07/16/24 9173   metFORMIN  (GLUCOPHAGE ) tablet 500 mg  500 mg Oral Q breakfast Towana Leita SAILOR, MD   500 mg at 07/16/24 0827   propranolol  ER (INDERAL  LA) 24 hr capsule 60 mg  60 mg Oral Daily Butler, Laura N, MD   60 mg at 07/16/24 0831   QUEtiapine  (SEROQUEL  XR) 24 hr tablet 300 mg  300 mg Oral QHS Butler, Laura N, MD   300 mg at 07/15/24 2038   traZODone  (DESYREL ) tablet 50 mg  50 mg Oral QHS Butler, Laura N, MD   50 mg at 07/15/24 2038    Lab Results: No results found for this or any previous visit (from the past 48 hours).  Blood Alcohol level:  Lab Results  Component Value Date   [facility] <15 07/04/2024   ETH <10 08/10/2022    Metabolic Disorder Labs: Lab Results  Component Value Date   HGBA1C 5.4 07/04/2024   MPG 108.28 07/04/2024   MPG 119.76 12/07/2023   No results found for: PROLACTIN Lab Results  Component Value Date   CHOL 223 (H) 07/04/2024   TRIG 100 07/04/2024   HDL 53 07/04/2024   CHOLHDL 4.2 07/04/2024   VLDL 20 07/04/2024   LDLCALC 150 (H) 07/04/2024   LDLCALC 92 12/07/2023    Physical Findings: AIMS:  ,  ,  ,  ,  ,  ,   CIWA:    COWS:     Musculoskeletal: Strength & Muscle Tone: within normal limits Gait & Station: normal Patient leans: N/A  Psychiatric Specialty Exam:  Presentation  General Appearance:  Appropriate for Environment; Casual  Eye  Contact: Good  Speech: Clear and Coherent; Normal Rate  Speech Volume: Normal  Handedness: Right   Mood and Affect  Mood: Anxious; Dysphoric  Affect: Depressed; Flat; Constricted   Thought Process  Thought Processes: Coherent  Descriptions of Associations:Intact  Orientation:Full (Stephens, Place and Person)  Thought Content:Perseveration  History of Schizophrenia/Schizoaffective disorder:No  Duration of Psychotic Symptoms:N/A  Hallucinations:Hallucinations: Auditory Description of Auditory Hallucinations: telling her to hurt her son  Ideas of Reference:None  Suicidal Thoughts:Suicidal Thoughts: -- (reports some urges)  Homicidal Thoughts:Homicidal Thoughts: -- (Continues to have ego-dystonic intrusive thoughts)   Sensorium  Memory: Immediate Fair  Judgment: Intact  Insight: Present   Executive Functions  Concentration: Fair  Attention Span: Fair  Recall: [employer] of Knowledge: Fair  Language: Fair   Psychomotor Activity  Psychomotor Activity:Psychomotor Activity: Normal   Assets  Assets: Manufacturing Systems Engineer; Resilience; Desire for Improvement   Sleep  Sleep:Sleep: Fair    Physical Exam: Physical Exam Vitals and nursing note reviewed.  Constitutional:      General: She is not in acute distress.    Appearance: Normal appearance. She is obese. She is not ill-appearing or toxic-appearing.  HENT:     Head: Normocephalic and atraumatic.  Pulmonary:     Effort: Pulmonary effort is normal.  Musculoskeletal:        General: Normal range of motion.  Neurological:     General: No focal deficit present.     Mental Status: She is alert.    Review of Systems  Respiratory:  Negative for cough and shortness of breath.   Cardiovascular:  Negative for chest pain.  Gastrointestinal:  Negative for abdominal pain, constipation, diarrhea, nausea and vomiting.  Neurological:  Negative for dizziness, weakness and headaches.   Psychiatric/Behavioral:  Positive for depression, hallucinations and suicidal ideas. The patient is nervous/anxious.    Blood pressure 133/79, pulse 85, temperature 98.7 F (37.1 C), temperature source Oral, resp. rate 20, height 5' 6 (1.676 m), weight 106.7 kg, SpO2 98%. Body mass index is 37.96 kg/m.   Treatment Plan Summary: Daily contact with patient to assess and evaluate symptoms and progress in treatment and Medication management  IZA PRESTON is a 38 yr old female who presented on 10/28 to [facility] due to intrusive Suicidal Thoughts and Homicidal Thoughts, she was admitted to [facility] on 10/[facility].  PPHx is significant for Bipolar Disorder, OCD, and Cluster B traits, and 4 Prior Psychiatric Hospitalizations (last [facility] 12/2023), and no history of Suicide Attempts.    Ellory reports worsening AH and continued intrusive thoughts.  She tolerated starting the Haldol  yesterday without issue but has not had any effect yet.  We will plan to increase her Clomipramine  tomorrow.  We will continue to monitor.    Bipolar I disorder, current episode mixed, (r/ out psychotic features)  OCD : -Continue Clomipramine  50 mg BID for OCD -Continue Lamictal  250 mg daily for mood stability -Continue Haldol  2 mg BID for psychosis and mood stability -Continue Seroquel  XR 300 mg QHS for mood stability and sleep -Continue Namenda  10 mg BID for reported intrusive thoughts (patient takes at home and reports benefit)  -Continue Trazodone  50 mg QHS -Continue Klonopin  0.5 mg BID PRN for anxiety + scheduled 0.5 mg at bedtime for sleep              -patient notified klonopin  will not be continued after hospital and to reserve for severe anxiety only -Continue Agitation Protocol: Haldol /Ativan /Benadryl    -Continue Metformin  500 mg daily -Continue Propanolol ER 60 mg daily -Continue Melatonin 3 mg QHS -Continue PRN'Stephens: Tylenol , Maalox, Milk of Magnesia   --  The risks/benefits/side-effects/alternatives to  medications were discussed in detail with the patient and Stephens was given for questions. The patient consents to medication trials.                -- Metabolic profile and EKG monitoring obtained while on an atypical antipsychotic (BMI: 37.96  Lipid Panel: WNL except Chol: 223, LDL: 150 HbgA1c: 5.4  EKG: Sinus Rhythm w/ PVC w/QTc: 450)              -- Encouraged patient to participate in unit milieu and in scheduled group therapies              -- Short Term Goals: Ability to identify changes in lifestyle to reduce recurrence of condition will improve, Ability to verbalize feelings will improve, Ability to disclose and discuss suicidal ideas, Ability to demonstrate self-control will improve, Ability to identify and develop effective coping behaviors will improve, Ability to maintain clinical measurements within normal limits will improve, Compliance with prescribed medications will improve, and Ability to identify triggers associated with substance abuse/mental health issues will improve             -- Long Term Goals: Improvement in symptoms so as ready for discharge   Safety and Monitoring:             -- Voluntary admission to inpatient psychiatric unit for safety, stabilization and treatment             -- Daily contact with patient to assess  and evaluate symptoms and progress in treatment             -- Patient'Stephens case to be discussed in multi-disciplinary team meeting             -- Observation Level : q15 minute checks             -- Vital signs:  q12 hours             -- Precautions: suicide, elopement, and assault  Discharge Planning:              -- Social work and case management to assist with discharge planning and identification of hospital follow-up needs prior to discharge             -- Estimated LOS: 4-6 more days             -- Discharge Concerns: Need to establish a safety plan; Medication compliance and effectiveness             -- Discharge Goals: Return home with outpatient  referrals for mental health follow-up including medication management/psychotherapy    Marsa GORMAN Rosser, DO 07/16/2024, 1:33 PM

## 2024-07-16 NOTE — BHH Group Notes (Signed)
 Adult Psychoeducational Group Note  Date:  07/16/2024 Time:  8:49 PM  Group Topic/Focus:  Wrap-Up Group:   The focus of this group is to help patients review their daily goal of treatment and discuss progress on daily workbooks.  Participation Level:  Active  Participation Quality:  Appropriate  Affect:  Appropriate  Cognitive:  Appropriate  Insight: Appropriate  Engagement in Group:  Engaged  Modes of Intervention:  Discussion  Additional Comments:  Pt told that today was an okay day on the unit, the highlight of which was talking to her oldest daughter on the phone. On the subject of goals for the coming week, Pt mentioned wanting to continue working on her anxiety. Pt rated today a 3 out of 10.  Kara Stephens 07/16/2024, 8:49 PM

## 2024-07-16 NOTE — Plan of Care (Addendum)
 Pt A & O X3. Denies SI, HI, VH and pain when assessed. Endorses +AH I keep hearing the voices tells me I'm a crazy bitch and kill your son. I just want to get rid of these thoughts. Affect is congruent with fair eye contact, logical, soft speech and forwards in conversations when engaged. Continues to report intrusive thoughts about drowning her son The thoughts are stronger today. Yesterday it was less intense but today it's strong again. Observed in milieu majority of this shift. Engaged in scheduled groups and activities on and off unit. Remains medications complaint without adverse drug reactions. Rates her depression, anxiety and hopelessness all 10/10. PRN Vistaril  given with desired effect. Safety checks maintained at Q 15 minutes intervals without self harm gestures or outburst. Emotional support, encouragement and reassurance offered.   Problem: Activity: Goal: Interest or engagement in activities will improve Outcome: Progressing Goal: Sleeping patterns will improve Outcome: Progressing   Problem: Coping: Goal: Ability to demonstrate self-control will improve Outcome: Progressing

## 2024-07-16 NOTE — Plan of Care (Signed)
(  Sleep Hours) -7.25 (Any PRNs that were needed, meds refused, or side effects to meds)- no prns (Any disturbances and when (visitation, over night)- no disturbances (Concerns raised by the patient)- no concerns (SI/HI/AVH)- denies all Pt observed in the milieu interacting with other patients.  Calm and cooperative during assessment. Problem: Education: Goal: Knowledge of [facility] Education information/materials will improve Outcome: Progressing Goal: Emotional status will improve Outcome: Progressing Goal: Mental status will improve Outcome: Progressing Goal: Verbalization of understanding the information provided will improve Outcome: Progressing   Problem: Activity: Goal: Interest or engagement in activities will improve Outcome: Progressing Goal: Sleeping patterns will improve Outcome: Progressing  Problem: Coping: Goal: Ability to verbalize frustrations and anger appropriately will improve Outcome: Progressing Goal: Ability to demonstrate self-control will improve Outcome: Progressing   Problem: Health Behavior/Discharge Planning: Goal: Identification of resources available to assist in meeting health care needs will improve Outcome: Progressing Goal: Compliance with treatment plan for underlying cause of condition will improve Outcome: Progressing   Problem: Physical Regulation: Goal: Ability to maintain clinical measurements within normal limits will improve Outcome: Progressing   Problem: Safety: Goal: Periods of time without injury will increase Outcome: Progressing

## 2024-07-16 NOTE — Group Note (Signed)
 [facility] LCSW Group Therapy Note   Group Date: 07/16/2024 Start Time: 1600 End Time: 1645   Type of Therapy/Topic:  Group Therapy:  Emotion Regulation  Participation Level:  Active   Mood:  Description of Group:    The purpose of this group is to assist patients in learning to regulate negative emotions and experience positive emotions. Patients will be guided to discuss ways in which they have been vulnerable to their negative emotions. These vulnerabilities will be juxtaposed with experiences of positive emotions or situations, and patients challenged to use positive emotions to combat negative ones. Special emphasis will be placed on coping with negative emotions in conflict situations, and patients will process healthy conflict resolution skills.  Therapeutic Goals: Patient will identify two positive emotions or experiences to reflect on in order to balance out negative emotions:  Patient will label two or more emotions that they find the most difficult to experience:  Patient will be able to demonstrate positive conflict resolution skills through discussion or role plays:   Summary of Patient Progress:   Pt was present and respectful during group. Pt was able to identify emotions reflect in life's experiences to include verbalizing positive skills to use when faced with hardships.     Therapeutic Modalities:   Cognitive Behavioral Therapy Feelings Identification Dialectical Behavioral Therapy   Golda Louder, LCSW

## 2024-07-16 NOTE — BHH Group Notes (Signed)
 Adult Psychoeducational Group Note  Date:  07/16/2024 Time:  6:46 PM  Group Topic/Focus: Social work group  Participation Level:  Active  Participation Quality:  Appropriate  Affect:  Appropriate  Cognitive:  Appropriate  Insight: Appropriate  Engagement in Group:  Engaged  Modes of Intervention:  Discussion  Additional Comments:  Pt attended the goals group and remained appropriate and engaged throughout the duration of the group.   Bridgett Hattabaugh O 07/16/2024, 6:46 PM

## 2024-07-16 NOTE — Plan of Care (Signed)

## 2024-07-16 NOTE — BHH Group Notes (Signed)
 Adult Psychoeducational Group Note  Date:  07/16/2024 Time:  6:45 PM  Group Topic/Focus:  Goals Group:   The focus of this group is to help patients establish daily goals to achieve during treatment and discuss how the patient can incorporate goal setting into their daily lives to aide in recovery. Orientation:   The focus of this group is to educate the patient on the purpose and policies of crisis stabilization and provide a format to answer questions about their admission.  The group details unit policies and expectations of patients while admitted.  Participation Level:  Active  Participation Quality:  Appropriate  Affect:  Appropriate  Cognitive:  Appropriate  Insight: Appropriate  Engagement in Group:  Engaged  Modes of Intervention:  Discussion  Additional Comments:  Pt attended the goals group and remained appropriate and engaged throughout the duration of the group.   Destany Severns O 07/16/2024, 6:45 PM

## 2024-07-16 NOTE — Progress Notes (Signed)
(  Sleep Hours) -8.5 (Any PRNs that were needed, meds refused, or side effects to meds)- none (Any disturbances and when (visitation, over night)-none (Concerns raised by the patient)- none (SI/HI/AVH)-endorses HI and AH

## 2024-07-17 DIAGNOSIS — F429 Obsessive-compulsive disorder, unspecified: Secondary | ICD-10-CM | POA: Diagnosis not present

## 2024-07-17 DIAGNOSIS — F3162 Bipolar disorder, current episode mixed, moderate: Secondary | ICD-10-CM | POA: Diagnosis not present

## 2024-07-17 MED ORDER — CLOMIPRAMINE HCL 25 MG PO CAPS
75.0000 mg | ORAL_CAPSULE | Freq: Two times a day (BID) | ORAL | Status: DC
  Administered 2024-07-17 – 2024-07-20 (×6): 75 mg via ORAL
  Filled 2024-07-17 (×5): qty 3

## 2024-07-17 NOTE — Group Note (Signed)
 Date:  07/17/2024 Time:  8:59 PM  Group Topic/Focus:  Wrap-Up Group:   The focus of this group is to help patients review their daily goal of treatment and discuss progress on daily workbooks.    Participation Level:  Active  Participation Quality:  Appropriate  Affect:  Appropriate  Cognitive:  Appropriate  Insight: Appropriate  Engagement in Group:  Improving  Modes of Intervention:  Discussion  Additional Comments:  Pt stated her goal for today was to focus on her treatment plan. Pt stated she accomplished her goal today. Pt stated she talked with her doctor and with her social worker about her care today. Pt rated her overall day a 3 out of 10. Pt stated she was able to contact her mother, sister, daughter, and husband today which improved her overall day. Pt stated she felt better about herself tonight. Pt stated she was able to attend all meals today. Pt stated she took all medications provided today. Pt stated her appetite was pretty good today. Pt rated her sleep last night was fair. Pt stated the goal tonight was to get some rest. Pt stated she had no physical pain tonight. Pt deny visual hallucinations and auditory issues tonight. Pt stated she had auditory issues early today but her nurse gave her medication that aided the situation. Pt was advised by right if auditory issues arise tonight to alert staff. Pt denies thoughts of harming herself or others. Pt stated she would alert staff if anything changed.  Kara Stephens 07/17/2024, 8:59 PM

## 2024-07-17 NOTE — BHH Group Notes (Signed)
 Adult Psychoeducational Group Note  Date:  07/17/2024 Time:  3:29 PM  Group Topic/Focus: Social Work Group   Participation Level:  Active  Participation Quality:  Attentive  Affect:  Appropriate  Cognitive:  Appropriate  Insight: Good  Engagement in Group:  Engaged  Modes of Intervention:  Discussion  Additional Comments:    [name] 07/17/2024, 3:29 PM

## 2024-07-17 NOTE — Progress Notes (Signed)
 [facility] MD Progress Note  07/17/2024 4:11 PM MYRISSA CHIPLEY  MRN:  [id number] Subjective:   Kara Stephens is a 38 yr old female who presented on 10/28 to [facility] due to intrusive Suicidal Thoughts and Homicidal Thoughts, she was admitted to [facility] on 10/29.  PPHx is significant for Bipolar Disorder, OCD, and Cluster B traits, and 4 Prior Psychiatric Hospitalizations (last [facility] 12/2023), and no history of Suicide Attempts.   Case was discussed in the multidisciplinary team. MAR was reviewed and patient was compliant with medications.  She received PRN Tylenol  and Hydroxyzine  x2 yesterday.   Psychiatric Team made the following recommendations yesterday: -Continue Clomipramine  50 mg BID for OCD  -Continue Lamictal  250 mg daily for mood stability -Start Haldol  2 mg BID for psychosis and mood stability -Continue Seroquel  XR 300 mg QHS for mood stability and sleep -Continue Namenda  10 mg BID for reported intrusive thoughts (patient takes at home and reports benefit)  -Continue Trazodone  50 mg QHS    On interview today patient reports she slept fair last night.  She reports her appetite is doing fair.  She reports she still has the SI urges.  She reports she continues to have the intrusive HI thoughts.  She reports continuing to have AH-voices telling her to hurt her son.  She reports no VH.  She reports no Paranoia or Ideas of Reference.  She reports no issues with her medications.  She reports that she is still very scared about the intrusive thoughts she has been having.  She reports that there has been some improvement in that their frequency has decreased some.  Discussed with her that we would increase her clomipramine  to continue to address these intrusive thoughts and she was agreeable with this.  Discussed using coping skills and distraction techniques when these thoughts do happen.  Specifically discussed square breathing with her and described the technique as well as wrote it down for her so  that she could begin to implement it.  She reports no other concerns at present.   Principal Problem: Bipolar 1 disorder, mixed, moderate (HCC) Diagnosis: Principal Problem:   Bipolar 1 disorder, mixed, moderate (HCC) Active Problems:   Obsessive compulsive disorder  Total Time spent with patient:  I personally spent 35 minutes on the unit in direct patient care. The direct patient care time included face-to-face time with the patient, reviewing the patient's chart, communicating with other professionals, and coordinating care.    Past Psychiatric History:  Bipolar Disorder, OCD, and Cluster B traits, and 4 Prior Psychiatric Hospitalizations (last [facility] 12/2023), and no history of Suicide Attempts.  Past Medical History:  Past Medical History:  Diagnosis Date   Anxiety    Back spasm    Bipolar I disorder (HCC)    Borderline personality disorder (HCC)    Class 3 obesity (HCC)    Cluster B personality disorder (HCC)    Hypercholesterolemia    Hypertension    Hypertriglyceridemia    Somatic symptom disorder    Tachycardia    Type 2 diabetes mellitus (HCC)    Vitamin D  insufficiency     Past Surgical History:  Procedure Laterality Date   APPENDECTOMY     CHOLECYSTECTOMY     HAND SURGERY     SHOULDER SURGERY     Family History:  Family History  Problem Relation Age of Onset   Hypertension Mother    Hyperlipidemia Mother    Suicidality Father    Depression Father    Family  Psychiatric  History:  Father- Bipolar Disorder, Completed Suicide    Social History:  Social History   Substance and Sexual Activity  Alcohol Use No     Social History   Substance and Sexual Activity  Drug Use No    Social History   Socioeconomic History   Marital status: Married    Spouse name: Not on file   Number of children: Not on file   Years of education: Not on file   Highest education level: Not on file  Occupational History   Not on file  Tobacco Use   Smoking status: Every  Day    Current packs/day: 0.50    Average packs/day: 0.5 packs/day for 5.7 years (2.8 ttl pk-yrs)    Types: Cigarettes    Start date: 11/2018    Last attempt to quit: 09/10/2016   Smokeless tobacco: Never  Vaping Use   Vaping status: Never Used  Substance and Sexual Activity   Alcohol use: No   Drug use: No   Sexual activity: Not on file  Other Topics Concern   Not on file  Social History Narrative   Not on file   Social Drivers of Health   Financial Resource Strain: Not on file  Food Insecurity: Food Insecurity Present (07/04/2024)   Hunger Vital Sign    Worried About Running Out of Food in the Last Year: Sometimes true    Ran Out of Food in the Last Year: Never true  Transportation Needs: No Transportation Needs (07/04/2024)   PRAPARE - Administrator, Civil Service (Medical): No    Lack of Transportation (Non-Medical): No  Physical Activity: Not on file  Stress: Stress Concern Present (11/16/2023)   Received from [facility] - Occupational Stress Questionnaire    Feeling of Stress : Very much  Social Connections: Unknown (01/10/2022)   Received from [facility]   Social Network    Social Network: Not on file   Additional Social History:                         Sleep: Fair Estimated Sleeping Duration (Last 24 Hours): 5.75-6.75 hours (Due to [employer] Time, the durations displayed may not accurately represent documentation during the time change interval)  Appetite:  Fair  Current Medications: Current Facility-Administered Medications  Medication Dose Route Frequency Provider Last Rate Last Admin   acetaminophen  (TYLENOL ) tablet 650 mg  650 mg Oral Q6H PRN McLauchlin, Jon, NP   650 mg at 07/17/24 1200   alum & mag hydroxide-simeth (MAALOX/MYLANTA) 200-200-20 MG/5ML suspension 30 mL  30 mL Oral Q4H PRN McLauchlin, Angela, NP       clomiPRAMINE  (ANAFRANIL ) capsule 75 mg  75 mg Oral BID  Axl Rodino S, DO       clonazePAM  (KLONOPIN ) disintegrating tablet 0.5 mg  0.5 mg Oral QHS Towana Leita SAILOR, MD   0.5 mg at 07/16/24 2047   clonazePAM  (KLONOPIN ) tablet 0.5 mg  0.5 mg Oral BID PRN Towana Leita SAILOR, MD   0.5 mg at 07/13/24 1132   haloperidol  (HALDOL ) tablet 2 mg  2 mg Oral BID Natnael Biederman S, DO   2 mg at 07/17/24 9178   haloperidol  (HALDOL ) tablet 5 mg  5 mg Oral Q6H PRN Towana Leita SAILOR, MD       Or   haloperidol  lactate (HALDOL ) injection 5 mg  5 mg Intramuscular Q6H PRN Towana Leita SAILOR,  MD   5 mg at 07/06/24 1508   hydrOXYzine  (ATARAX ) tablet 25 mg  25 mg Oral TID PRN Dandra Velardi S, DO   25 mg at 07/16/24 9170   lamoTRIgine  (LAMICTAL ) tablet 250 mg  250 mg Oral Daily Towana Leita SAILOR, MD   250 mg at 07/17/24 0820   magnesium  hydroxide (MILK OF MAGNESIA) suspension 30 mL  30 mL Oral Daily PRN McLauchlin, Angela, NP       melatonin tablet 3 mg  3 mg Oral QHS Butler, Laura N, MD   3 mg at 07/16/24 2047   memantine  (NAMENDA ) tablet 10 mg  10 mg Oral BID Butler, Laura N, MD   10 mg at 07/17/24 9178   metFORMIN  (GLUCOPHAGE ) tablet 500 mg  500 mg Oral Q breakfast Towana Leita SAILOR, MD   500 mg at 07/17/24 9178   propranolol  ER (INDERAL  LA) 24 hr capsule 60 mg  60 mg Oral Daily Butler, Laura N, MD   60 mg at 07/17/24 9160   QUEtiapine  (SEROQUEL  XR) 24 hr tablet 300 mg  300 mg Oral QHS Butler, Laura N, MD   300 mg at 07/16/24 2047   traZODone  (DESYREL ) tablet 50 mg  50 mg Oral QHS Butler, Laura N, MD   50 mg at 07/16/24 2047    Lab Results: No results found for this or any previous visit (from the past 48 hours).  Blood Alcohol level:  Lab Results  Component Value Date   [facility] <15 07/04/2024   ETH <10 08/10/2022    Metabolic Disorder Labs: Lab Results  Component Value Date   HGBA1C 5.4 07/04/2024   MPG 108.28 07/04/2024   MPG 119.76 12/07/2023   No results found for: PROLACTIN Lab Results  Component Value Date   CHOL 223 (H) 07/04/2024   TRIG 100  07/04/2024   HDL 53 07/04/2024   CHOLHDL 4.2 07/04/2024   VLDL 20 07/04/2024   LDLCALC 150 (H) 07/04/2024   LDLCALC 92 12/07/2023    Physical Findings: AIMS:  ,  ,  ,  ,  ,  ,   CIWA:    COWS:     Musculoskeletal: Strength & Muscle Tone: within normal limits Gait & Station: normal Patient leans: N/A  Psychiatric Specialty Exam:  Presentation  General Appearance:  Appropriate for Environment; Casual  Eye Contact: Good  Speech: Clear and Coherent; Normal Rate  Speech Volume: Normal  Handedness: Right   Mood and Affect  Mood: Anxious  Affect: Constricted; Flat   Thought Process  Thought Processes: Coherent  Descriptions of Associations:Intact  Orientation:Full (Time, Place and Person)  Thought Content:Perseveration  History of Schizophrenia/Schizoaffective disorder:No  Duration of Psychotic Symptoms:N/A  Hallucinations:Hallucinations: Auditory Description of Auditory Hallucinations: telling her to hurt her son  Ideas of Reference:None  Suicidal Thoughts:Suicidal Thoughts: -- (reports urges)  Homicidal Thoughts:Homicidal Thoughts: -- (Continues to have ego-dystonic intrusive thoughts)   Sensorium  Memory: Immediate Fair  Judgment: Intact  Insight: Present   Executive Functions  Concentration: Fair  Attention Span: Fair  Recall: [employer] of Knowledge: Fair  Language: Fair   Psychomotor Activity  Psychomotor Activity:Psychomotor Activity: Normal   Assets  Assets: Manufacturing Systems Engineer; Resilience   Sleep  Sleep:Sleep: Fair    Physical Exam: Physical Exam Vitals and nursing note reviewed.  Constitutional:      General: She is not in acute distress.    Appearance: Normal appearance. She is normal weight. She is not ill-appearing or toxic-appearing.  HENT:  Head: Normocephalic and atraumatic.  Pulmonary:     Effort: Pulmonary effort is normal.  Musculoskeletal:        General: Normal range of motion.   Neurological:     General: No focal deficit present.     Mental Status: She is alert.    Review of Systems  Respiratory:  Negative for cough and shortness of breath.   Cardiovascular:  Negative for chest pain.  Gastrointestinal:  Negative for abdominal pain, constipation, diarrhea, nausea and vomiting.  Neurological:  Negative for dizziness, weakness and headaches.  Psychiatric/Behavioral:  Positive for depression, hallucinations (AH) and suicidal ideas. The patient is nervous/anxious.    Blood pressure 126/82, pulse 87, temperature 99 F (37.2 C), resp. rate 20, height 5' 6 (1.676 m), weight 106.7 kg, SpO2 98%. Body mass index is 37.96 kg/m.   Treatment Plan Summary: Daily contact with patient to assess and evaluate symptoms and progress in treatment and Medication management  Kara Stephens is a 38 yr old female who presented on 10/28 to [facility] due to intrusive Suicidal Thoughts and Homicidal Thoughts, she was admitted to [facility] on 10/29.  PPHx is significant for Bipolar Disorder, OCD, and Cluster B traits, and 4 Prior Psychiatric Hospitalizations (last [facility] 12/2023), and no history of Suicide Attempts.    Jovi is continuing to have significant anxiety over the intrusive thoughts of both SI and HI as well as AH telling her to hurt her son.  She does report that the frequency of these thoughts has somewhat decreased.  We will proceed with increasing her clomipramine  today to address these thoughts.  Discussed square breathing with her to use when these intrusive thoughts started happening.  Will not make any other changes to her medications at this time.  We will continue to monitor.   Bipolar I disorder, current episode mixed, (r/ out psychotic features)  OCD : -Increase Clomipramine  to 75 mg BID for OCD -Continue Lamictal  250 mg daily for mood stability -Continue Haldol  2 mg BID for psychosis and mood stability -Continue Seroquel  XR 300 mg QHS for mood stability and  sleep -Continue Namenda  10 mg BID for reported intrusive thoughts (patient takes at home and reports benefit)  -Continue Trazodone  50 mg QHS -Continue Klonopin  0.5 mg BID PRN for anxiety + scheduled 0.5 mg at bedtime for sleep              -patient notified klonopin  will not be continued after hospital and to reserve for severe anxiety only -Continue Agitation Protocol: Haldol /Ativan /Benadryl    -Continue Metformin  500 mg daily -Continue Propanolol ER 60 mg daily -Continue Melatonin 3 mg QHS -Continue PRN's: Tylenol , Maalox, Milk of Magnesia   --  The risks/benefits/side-effects/alternatives to medications were discussed in detail with the patient and time was given for questions. The patient consents to medication trials.                -- Metabolic profile and EKG monitoring obtained while on an atypical antipsychotic (BMI: 37.96  Lipid Panel: WNL except Chol: 223, LDL: 150 HbgA1c: 5.4  EKG: Sinus Rhythm w/ PVC w/QTc: 450)              -- Encouraged patient to participate in unit milieu and in scheduled group therapies              -- Short Term Goals: Ability to identify changes in lifestyle to reduce recurrence of condition will improve, Ability to verbalize feelings will improve, Ability to disclose and discuss suicidal  ideas, Ability to demonstrate self-control will improve, Ability to identify and develop effective coping behaviors will improve, Ability to maintain clinical measurements within normal limits will improve, Compliance with prescribed medications will improve, and Ability to identify triggers associated with substance abuse/mental health issues will improve             -- Long Term Goals: Improvement in symptoms so as ready for discharge   Safety and Monitoring:             -- Voluntary admission to inpatient psychiatric unit for safety, stabilization and treatment             -- Daily contact with patient to assess and evaluate symptoms and progress in treatment              -- Patient's case to be discussed in multi-disciplinary team meeting             -- Observation Level : q15 minute checks             -- Vital signs:  q12 hours             -- Precautions: suicide, elopement, and assault  Discharge Planning:              -- Social work and case management to assist with discharge planning and identification of hospital follow-up needs prior to discharge             -- Estimated LOS: 4-6 more days             -- Discharge Concerns: Need to establish a safety plan; Medication compliance and effectiveness             -- Discharge Goals: Return home with outpatient referrals for mental health follow-up including medication management/psychotherapy    Marsa GORMAN Rosser, DO 07/17/2024, 4:11 PM

## 2024-07-17 NOTE — Group Note (Signed)
 LCSW Group Therapy Note   Group Date: 07/17/2024 Start Time: 1300 End Time: 1400   Participation:  patient was present and actively participated in the discussion.    Type of Therapy:  Group Therapy  Topic:  Understanding Your Path to Change  Objective:   The goal is to help individuals understand the stages of change, identify where they currently are in the process, and provide actionable next steps to continue moving forward in their journey of change.  Goals: - Learn about the six stages of change:  Precontemplation, Contemplation, Preparation, Action, Maintenance, and Relapse - Reflect on Current Change Efforts:  Recognize which stage participants are in regarding a personal change. - Plan Next Steps for Moving Forward:  Create an action plan based on their current stage of change.  In this session, we explored the Stages of Change as a framework to understand the process of change.  We discussed how each stage helps individuals recognize where they are in their personal journey and used the Stages of Change Worksheet for self-reflection. Participants answered questions to better understand their current stage, challenges, and progress. We also emphasized the importance of moving forward, even if setbacks (Relapse) occur, and created actionable steps to help participants continue progressing. By the end of the session, participants gained a clearer understanding of their path to change and left with a clear plan for next steps.  Modalities:  - Elements of CBT (cognitive restructuring, problem solving)  - Element of DBT (mindfulness, distress tolerance)   Waylin Dorko O Tywanda Rice, LCSWA 07/17/2024  3:06 PM

## 2024-07-17 NOTE — Group Note (Signed)
 Recreation Therapy Group Note   Group Topic:Communication  Group Date: 07/17/2024 Start Time: 1030 End Time: 1045 Facilitators: Suriya Kovarik-McCall, LRT,CTRS Location: [address] Dayroom   Group Topic: Communication, Team Building, Problem Solving  Goal Area(s) Addresses:  Patient will effectively work with peer towards shared goal.  Patient will identify skills used to make activity successful.  Patient will identify how skills used during activity can be applied to reach post d/c goals.   Behavioral Response: Engaged  Intervention: STEM Activity- Glass Blower/designer  Activity: Tallest [employer]. In teams of 5-6, patients were given 11 craft pipe cleaners. Using the materials provided, patients were instructed to compete again the opposing team(s) to build the tallest free-standing structure from floor level. The activity was timed; difficulty increased by clinical research associate as production designer, theatre/television/film continued.  Systematically resources were removed with additional directions for example, placing one arm behind their back, working in silence, and shape stipulations. LRT facilitated post-activity discussion reviewing team processes and necessary communication skills involved in completion. Patients were encouraged to reflect how the skills utilized, or not utilized, in this activity can be incorporated to positively impact support systems post discharge.  Education: Pharmacist, Community, Scientist, Physiological, Discharge Planning   Education Outcome: Acknowledges education/In group clarification offered/Needs additional education.    Affect/Mood: Appropriate   Participation Level: Engaged   Participation Quality: Independent   Behavior: Appropriate   Speech/Thought Process: Focused   Insight: Good   Judgement: Good   Modes of Intervention: STEM Activity   Patient Response to Interventions:  Engaged   Education Outcome:  In group clarification offered    Clinical  Observations/Individualized Feedback: Pt was brighter, engaged and worked well with peer. Pt expressed during processing on of the skills they used was problem solving. Pt also explained that confiding in other people let them in on what's going on with you.     Plan: Continue to engage patient in RT group sessions 2-3x/week.   Adabelle Griffiths-McCall, LRT,CTRS 07/17/2024 12:14 PM

## 2024-07-17 NOTE — [facility] Group Notes (Signed)
 Adult Psychoeducational Group Note  Date:  07/17/2024 Time:  9:26 AM  Group Topic/Focus: Goals Group Goals Group:   The focus of this group is to help patients establish daily goals to achieve during treatment and discuss how the patient can incorporate goal setting into their daily lives to aide in recovery.  Participation Level:  Active  Participation Quality:  Attentive  Affect:  Appropriate  Cognitive:  Appropriate  Insight: Good  Engagement in Group:  Engaged  Modes of Intervention:  Discussion  Additional Comments:  Pt. Stated that she will try to get better talking to the Dr.  Annett Redman Ramsay 07/17/2024, 9:26 AM

## 2024-07-17 NOTE — [facility] Group Notes (Signed)
 Adult Psychoeducational Group Note  Date:  07/17/2024 Time:  1:53 PM  Group Topic/Focus: Recreational Therapy Group   Participation Level:  Active  Participation Quality:  Attentive  Affect:  Appropriate  Cognitive:  Appropriate  Insight: Good  Engagement in Group:  Engaged  Modes of Intervention:  Education  Additional Comments:    [name] 07/17/2024, 1:53 PM

## 2024-07-17 NOTE — Plan of Care (Signed)
   Problem: Education: Goal: Knowledge of [facility] Education information/materials will improve Outcome: Progressing   Problem: Activity: Goal: Interest or engagement in activities will improve Outcome: Progressing   Problem: Safety: Goal: Periods of time without injury will increase Outcome: Progressing

## 2024-07-18 DIAGNOSIS — F429 Obsessive-compulsive disorder, unspecified: Secondary | ICD-10-CM | POA: Diagnosis not present

## 2024-07-18 DIAGNOSIS — F3162 Bipolar disorder, current episode mixed, moderate: Secondary | ICD-10-CM | POA: Diagnosis not present

## 2024-07-18 NOTE — [facility] Group Notes (Signed)
 Adult Psychoeducational Group Note  Date:  07/18/2024 Time:  9:18 AM  Group Topic/Focus: [place]rentation Group Goals Group:   The focus of this group is to help patients establish daily goals to achieve during treatment and discuss how the patient can incorporate goal setting into their daily lives to aide in recovery.  Participation Level:  Did Not Attend  Participation Quality:  na  Affect:  na  Cognitive:  na  Insight: na  Engagement in Group:  na  Modes of Intervention:  na  Additional Comments:  na  Tranquilino Fischler Lee 07/18/2024, 9:18 AM

## 2024-07-18 NOTE — Progress Notes (Signed)
(  Sleep Hours) - 8 (Any PRNs that were needed, meds refused, or side effects to meds)- none (Any disturbances and when (visitation, over night)- n/a (Concerns raised by the patient)- none (SI/HI/AVH)- Denies

## 2024-07-18 NOTE — Progress Notes (Signed)
 [facility] MD Progress Note  07/18/2024 9:29 AM Kara Stephens  MRN:  [id number]  Principal Problem: Bipolar 1 disorder, mixed, moderate (HCC) Diagnosis: Principal Problem:   Bipolar 1 disorder, mixed, moderate (HCC) Active Problems:   Obsessive compulsive disorder  Total Time spent with patient: 35 minutes   Identifying Information and Past Psychiatric History:  The patient is a 38 y.o. female (domiciled with husband and children,) with a medical history of pre-diabetes and a psychiatric history most consistent with bipolar 1 disorder, OCD and cluster B traits, currently admitted due to high anxiety related to intrusive thoughts of suicide (without actual SI).   Psychiatric history notable for prior bipolar II diagnosis with approximately 5 years of stability on lamotrigine . Symptoms reoccurred with worsening severity after the birth of her second child at age 84. Since then she has had at least 2 credible manic symptoms with psychosis. Symptoms have included racing thoughts, reduced sleep, impulsivity, pressured speech and psychomotor agitation and have included psychotic symptoms such as hallucinations. In addition to elevated mood states, the patient has had recurrent depressive symptoms characterized by low mood, anhedonia, irritability, low energy and negative intrusive thoughts. She has had 4 hospitalizations in the last 2-3 years for depression and mania: March 2025 for mania, February 2025 for mania, December 2023 bipolar mixed episode with catatonic features and OCD. She was most recently admitted from 12/09/23-12/14/23 at [facility] self-admitted largely for anxiety symptoms, feeling that her mania was not well controlled. She has no prior suicide attempts. She has been on numerous medications for management of mood symptoms as noted below with numerous side effects/intolerances or poor effects. She does present with instability in relationships, poor coping, recurrent SI, affective lability concerning  for cluster B traits confounding mood symptoms. Patient has no prior suicide attempts. Currently follows outpatient with Dr. Chyrl Na for med management and she sees Velinda Beat for therapy.   Interval events: The patient's clomipramine  was increased to 75 mg BID yesterday to address ongoing intrusive thoughts. Did attend groups yesterday with improving engagement. Slept 8 hrs overnight, although subjectively reports less. BP 113/76 (BP Location: Right Arm)   Pulse 84   Temp 99 F (37.2 C)   Resp 20   Ht 5' 6 (1.676 m)   Wt 106.7 kg   SpO2 97%   BMI 37.96 kg/m     Interview today: Today the patient states she is okay. She believes the frequency of the intrusive thoughts has gone down, and that the clomipramine  might be helpful, but that she is still very distressed by the thoughts and the impulses that come with it. Although she does not want to actually end her own life or her sons she continues to feel as though there is an intrusive thought/impulse to do so and is afraid to be around her son because of this. She states that she has not found anything to be helpful in the moment to reduce to anxiety around the intrusive thought.  She feels she continues to have poor sleep - states she was up at 2 am and was not able to fall back asleep. Discussed possibility of moving back into the regular hall to try to work with more groups and be around more people. Expressed understanding.    Collateral: Wilburt Kinsey Karch [phone]    Past Medical History:  Past Medical History:  Diagnosis Date   Anxiety    Back spasm    Bipolar I disorder (HCC)    Borderline personality disorder (  HCC)    Class 3 obesity (HCC)    Cluster B personality disorder (HCC)    Hypercholesterolemia    Hypertension    Hypertriglyceridemia    Somatic symptom disorder    Tachycardia    Type 2 diabetes mellitus (HCC)    Vitamin D  insufficiency     Past Surgical History:  Procedure Laterality Date    APPENDECTOMY     CHOLECYSTECTOMY     HAND SURGERY     SHOULDER SURGERY     Family History:  Family History  Problem Relation Age of Onset   Hypertension Mother    Hyperlipidemia Mother    Suicidality Father    Depression Father    Social History:  Social History   Substance and Sexual Activity  Alcohol Use No     Social History   Substance and Sexual Activity  Drug Use No    Social History   Socioeconomic History   Marital status: Married    Spouse name: Not on file   Number of children: Not on file   Years of education: Not on file   Highest education level: Not on file  Occupational History   Not on file  Tobacco Use   Smoking status: Every Day    Current packs/day: 0.50    Average packs/day: 0.5 packs/day for 5.7 years (2.8 ttl pk-yrs)    Types: Cigarettes    Start date: 11/2018    Last attempt to quit: 09/10/2016   Smokeless tobacco: Never  Vaping Use   Vaping status: Never Used  Substance and Sexual Activity   Alcohol use: No   Drug use: No   Sexual activity: Not on file  Other Topics Concern   Not on file  Social History Narrative   Not on file   Social Drivers of Health   Financial Resource Strain: Not on file  Food Insecurity: Food Insecurity Present (07/04/2024)   Hunger Vital Sign    Worried About Running Out of Food in the Last Year: Sometimes true    Ran Out of Food in the Last Year: Never true  Transportation Needs: No Transportation Needs (07/04/2024)   PRAPARE - Administrator, Civil Service (Medical): No    Lack of Transportation (Non-Medical): No  Physical Activity: Not on file  Stress: Stress Concern Present (11/16/2023)   Received from [facility] of Occupational Health - Occupational Stress Questionnaire    Feeling of Stress : Very much  Social Connections: Unknown (01/10/2022)   Received from [facility]   Social Network    Social Network: Not on file    Current Medications: Current  Facility-Administered Medications  Medication Dose Route Frequency Provider Last Rate Last Admin   acetaminophen  (TYLENOL ) tablet 650 mg  650 mg Oral Q6H PRN McLauchlin, Jon, NP   650 mg at 07/17/24 1200   alum & mag hydroxide-simeth (MAALOX/MYLANTA) 200-200-20 MG/5ML suspension 30 mL  30 mL Oral Q4H PRN McLauchlin, Angela, NP       clomiPRAMINE  (ANAFRANIL ) capsule 75 mg  75 mg Oral BID Pashayan, Alexander S, DO   75 mg at 07/18/24 0745   clonazePAM  (KLONOPIN ) disintegrating tablet 0.5 mg  0.5 mg Oral QHS Towana Leita SAILOR, MD   0.5 mg at 07/17/24 2032   clonazePAM  (KLONOPIN ) tablet 0.5 mg  0.5 mg Oral BID PRN Towana Leita SAILOR, MD   0.5 mg at 07/17/24 1654   haloperidol  (HALDOL ) tablet 2 mg  2 mg Oral BID  Pashayan, Alexander S, DO   2 mg at 07/18/24 0745   haloperidol  (HALDOL ) tablet 5 mg  5 mg Oral Q6H PRN Towana Leita SAILOR, MD       Or   haloperidol  lactate (HALDOL ) injection 5 mg  5 mg Intramuscular Q6H PRN Kayia Billinger N, MD   5 mg at 07/06/24 1508   hydrOXYzine  (ATARAX ) tablet 25 mg  25 mg Oral TID PRN Pashayan, Alexander S, DO   25 mg at 07/16/24 9170   lamoTRIgine  (LAMICTAL ) tablet 250 mg  250 mg Oral Daily Towana Leita SAILOR, MD   250 mg at 07/18/24 9255   magnesium  hydroxide (MILK OF MAGNESIA) suspension 30 mL  30 mL Oral Daily PRN McLauchlin, Angela, NP       melatonin tablet 3 mg  3 mg Oral QHS Ayslin Kundert N, MD   3 mg at 07/17/24 2032   memantine  (NAMENDA ) tablet 10 mg  10 mg Oral BID Delisha Peaden N, MD   10 mg at 07/18/24 0745   metFORMIN  (GLUCOPHAGE ) tablet 500 mg  500 mg Oral Q breakfast Towana Leita SAILOR, MD   500 mg at 07/18/24 0745   propranolol  ER (INDERAL  LA) 24 hr capsule 60 mg  60 mg Oral Daily Rini Moffit N, MD   60 mg at 07/18/24 0745   QUEtiapine  (SEROQUEL  XR) 24 hr tablet 300 mg  300 mg Oral QHS Vadhir Mcnay N, MD   300 mg at 07/17/24 2031   traZODone  (DESYREL ) tablet 50 mg  50 mg Oral QHS Renly Roots N, MD   50 mg at 07/17/24 2032    Lab Results:  No results found  for this or any previous visit (from the past 48 hours).   Blood Alcohol level:  Lab Results  Component Value Date   [facility] <15 07/04/2024   ETH <10 08/10/2022    Metabolic Disorder Labs: Lab Results  Component Value Date   HGBA1C 5.4 07/04/2024   MPG 108.28 07/04/2024   MPG 119.76 12/07/2023   No results found for: PROLACTIN Lab Results  Component Value Date   CHOL 223 (H) 07/04/2024   TRIG 100 07/04/2024   HDL 53 07/04/2024   CHOLHDL 4.2 07/04/2024   VLDL 20 07/04/2024   LDLCALC 150 (H) 07/04/2024   LDLCALC 92 12/07/2023    Physical Findings:  Mental Status exam: Appearance: white female of slightly elevated BMI, appropriately groomed in casual clothing, seen sitting in her room Eye contact: good  Attitude towards examiner: cooperative, although somewhat aloof Psychomotor: no agitation or retardation  Speech: normal in prosody; slow and meandering; not pressured  Language: no delays  Mood: okay  Affect: congruent, neutral and fatigued  Thought content: denying SI and HI (reporting intrusive thoughts of harming self and son but no desire or intent to act on thoughts), no delusions expressed  Thought Process: linear, organized and goal-directed  Perception: denying AVH, not RTIS  Insight: fair  Judgement: fair    Orientation: x3 Attention/Concentration: good - attends to interview  Memory/Cognition: grossly intact   Fund of Knowledge: Average      Musculoskeletal: Strength & Muscle Tone: within normal limits Gait & Station: normal Patient leans: N/A    Physical Exam Constitutional:      Appearance: Normal appearance.  HENT:     Head: Normocephalic and atraumatic.  Pulmonary:     Effort: Pulmonary effort is normal.  Abdominal:     General: There is no distension.  Musculoskeletal:  General: Normal range of motion.     Cervical back: Normal range of motion.  Neurological:     General: No focal deficit present.     Mental Status: She is  alert.    Review of Systems  All other systems reviewed and are negative.  Blood pressure 113/76, pulse 84, temperature 99 F (37.2 C), resp. rate 20, height 5' 6 (1.676 m), weight 106.7 kg, SpO2 97%. Body mass index is 37.96 kg/m.   Treatment Plan Summary: Daily contact with patient to assess and evaluate symptoms and progress in treatment  Assessment: The patient is a 38 y.o. female with a psychiatric history most consistent with bipolar 1 disorder, current episode mixed and cluster B traits. She had carried a bipolar II diagnosis through much of adult life with worsening of symptoms occurring post-partum at age 19. Since then she has had 4 psychiatric admissions, 2 for credible manic episodes with psychosis. She has additionally presented with recurrent depressive episodes, although no suicide attempts. Mood symptoms are confounded by cluster B traits with clear history of unstable relationships, affective lability, poor coping, recurrent SI. On this admission the patient presents largely with concerns related to high anxiety and intrusive thoughts. Although reporting concerns for mania, aside from mildly reduced sleep (5 hrs) and rapidly cycling thoughts she does not present with endorsed active manic symptoms and does not present with observable signs of mania.  Given this admission was precipitated by several recent psychosocial stressors, personality pathology may be contributing to worsening of symptoms  to some degree. Regardless, she has endorsed significant distress with current intrusive thoughts of harm to self/others, overall high anxiety and urges to count to reduce this more consistent with an OCD picture.      The patient has had minimal improvements in subjective intrusive thoughts (sometimes described as hallucinations although with no additional objective evidence of psychosis) despite ongoign medication changes, including addition of haldol  2 mg BID. She did not possible  decrease in frequency and was documented to attend a group with better engagement yesterday. Clomipramine  was increased to 75 mg BID yesterday. Today the patient continued to report ongoing intrusive thoughts that had decreased in frequency but not severity. Still voicing fears about going home. Will continue current medications and try to move her to front hall to begin working on coping strategies while around others.       DSM-5 diagnoses: Bipolar I disorder, current episode mixed, no current psychotic features  Obsessive-Compulsive disorder  Cluster B traits              -r/o BPD   Plan:   Legal Status: -voluntary    Safety -q15 minute checks  -elopement, suicide and assault precautions  -daily vitals   Psychiatric Concerns  -Continue Lamotrigine  250 mg daily for mood  -Continue Seroquel  XR to 300 mg at bedtime for sleep and mood stabilization  -Continue Namenda  10 mg BID for reported intrusive thoughts (patient takes at home and reports benefit) -Continue Trazodone  50 mg at bedtime for sleep -Continue haldol  2 mg BID to psychosis/mood stability -Continue Clomipramine  75 mg BID for intrusive thoughts related to OCD -Continue Klonopin  0.5 mg BID PRN for anxiety + scheduled 0.5 mg at bedtime for sleep   -patient notified klonopin  will not be continued after hospital and to reserve for severe anxiety only -Continue Propranolol  ER 60 mg daily  -Continue PRN haldol  5 mg PO or IM q6h for agitation    Substance use concerns  -none currently  Nicotine  Replacement  N/A    Medical concerns Pre-diabetes -Current A1c WNL (5.4) but was elevated 7 months ago prior to beginning metformin  -Continue metformin  500 mg daily      Additional PRNs: -Tylenol  tablets 650 mg every 6 hours as needed for pain -Maalox/Mylanta suspension 30 mL every 4 hours as needed for indigestion  -Milk of Magnesia 30 mL daily as needed for constipation   Labs -Reviewed as documented in HPI    Psychosocial interventions  -daily medication management with psychiatry -Medication education regarding risks/benefits and alternatives -bedside psychotherapy as indicated  -Patient will be encouraged to participate and engage with group therapy  -Appreciate SW assistance in coordinating safe disposition    Leita LOISE Arts, MD 07/18/2024, 9:29 AM

## 2024-07-18 NOTE — Group Note (Signed)
 Recreation Therapy Group Note   Group Topic:Animal Assisted Therapy   Group Date: 07/18/2024 Start Time: 0950 End Time: 1030 Facilitators: Tagen Milby-McCall, LRT,CTRS Location: [address] Dayroom   Animal-Assisted Activity (AAA) Program Checklist/Progress Notes Patient Eligibility Criteria Checklist & Daily Group note for Rec Tx Intervention  AAA/T Program Assumption of Risk Form signed by Patient/ or Parent Legal Guardian Yes  Patient is free of allergies or severe asthma Yes  Patient reports no fear of animals Yes  Patient reports no history of cruelty to animals Yes  Patient understands his/her participation is voluntary Yes  Patient washes hands before animal contact Yes  Patient washes hands after animal contact Yes  Behavioral Response: Engaged   Education: Charity Fundraiser, Appropriate Animal Interaction   Education Outcome: Acknowledges education.    Affect/Mood: Appropriate   Participation Level: Engaged   Participation Quality: Independent   Behavior: Appropriate   Speech/Thought Process: Focused   Insight: Good   Judgement: Good   Modes of Intervention: Teaching Laboratory Technician   Patient Response to Interventions:  Engaged   Education Outcome:  In group clarification offered    Clinical Observations/Individualized Feedback: Patient attended session and interacted appropriately with therapy dog and peers. Patient asked appropriate questions about therapy dog and his training. Patient shared stories about their pets at home with group.     Plan: Continue to engage patient in RT group sessions 2-3x/week.   Leontine Radman-McCall, LRT,CTRS  07/18/2024 1:17 PM

## 2024-07-18 NOTE — B[facility] Group Notes (Signed)
 Adult Psychoeducational Group Note  Date:  07/18/2024 Time:  1:38 PM  Group Topic/Focus: Pharmacies Group   Participation Level:  Active  Participation Quality:  Attentive  Affect:  Appropriate  Cognitive:  Appropriate  Insight: Good  Engagement in Group:  Engaged  Modes of Intervention:  Discussion  Additional Comments:    [name] 07/18/2024, 1:38 PM

## 2024-07-18 NOTE — BHH Group Notes (Signed)
 Adult Psychoeducational Group Note  Date:  07/18/2024 Time:  12:01 PM  Group Topic/Focus:Recreational Therapy Group   Participation Level:  Did Not Attend  Participation Quality:    Affect:    Cognitive:    Insight:   Engagement in Group:    Modes of Intervention:    Additional Comments:    [name] 07/18/2024, 12:01 PM

## 2024-07-18 NOTE — B[facility] Group Notes (Signed)
 Adult Psychoeducational Group Note  Date:  07/18/2024 Time:  2:09 PM  Group Topic/Focus: Pharmacy Group Developing a Wellness Toolbox:   The focus of this group is to help patients develop a wellness toolbox with skills and strategies to promote recovery upon discharge.  Participation Level:  Active  Participation Quality:  Appropriate  Affect:  Appropriate  Cognitive:  Appropriate  Insight: Appropriate  Engagement in Group:  Engaged  Modes of Intervention:  Discussion  Additional Comments:  The patient engaged in group discussion.  Edrei Norgaard Lee 07/18/2024, 2:09 PM

## 2024-07-18 NOTE — B[facility] Group Notes (Signed)
 Kara Stephens attended the pet therapy group today 07/18/24 [phone]).

## 2024-07-18 NOTE — Group Note (Signed)
 Recreation Therapy Group Note   Group Topic:Self-Esteem  Group Date: 07/18/2024 Start Time: 1040 End Time: 1052 Facilitators: Keeanna Villafranca-McCall, LRT,CTRS Location: [address] Dayroom   Group Topic: Self-esteem  Goal Area(s) Addresses:  Patient will identify and write at positive traits and words about themself. Patient will acknowledge the benefit of healthy self-esteem. Patient will endorse understanding of ways to increase self-esteem.   Behavioral Response:    Intervention: Personalized Plate- printed license plate template, markers or colored pencils   Activity: LRT began group session with open dialogue asking the patients to define self-esteem and verbally identify positive qualities and traits people may possess. Patients were then instructed to design a personalized license plate, with words and drawings, representing at least 3 positive things about themselves. Pts were encouraged to include favorites, things they are proud of, what they enjoy doing, and dreams for their future. If a patient had a life motto or a meaningful phase that expressed their life values, pt's were asked to incorporate that into their design as well. Patients were given the opportunity to share their completed work with the group.  Education: Healthy self-esteem, Positive character traits, Accepting compliments, Leisure as competence and coping, Support Systems, Discharge planning LRT educated patients on the importance of healthy self-esteem and ways to build self-esteem. LRT addressed discharge planning reviewing positive coping skills and healthy support systems.  Education Outcome: Acknowledges education/In group clarification offered   Affect/Mood: N/A   Participation Level: Did not attend    Clinical Observations/Individualized Feedback:      Plan: Continue to engage patient in RT group sessions 2-3x/week.   Jaremy Nosal-McCall, LRT,CTRS 07/18/2024 11:39 AM

## 2024-07-18 NOTE — Group Note (Signed)
 Date:  07/18/2024 Time:  8:51 PM  Group Topic/Focus:  Wrap-Up Group:   The focus of this group is to help patients review their daily goal of treatment and discuss progress on daily workbooks.    Participation Level:  Active  Participation Quality:  Appropriate  Affect:  Appropriate  Cognitive:  Appropriate  Insight: Appropriate  Engagement in Group:  Engaged  Modes of Intervention:  Education and Exploration  Additional Comments:  Patient attended and participated in group tonight.  She reports that her goal for today was to dpeak with her doctor and she did.  Gwenn Chillington Dacosta 07/18/2024, 8:51 PM

## 2024-07-19 ENCOUNTER — Encounter (HOSPITAL_COMMUNITY): Payer: Self-pay

## 2024-07-19 DIAGNOSIS — F429 Obsessive-compulsive disorder, unspecified: Secondary | ICD-10-CM | POA: Diagnosis not present

## 2024-07-19 DIAGNOSIS — F3162 Bipolar disorder, current episode mixed, moderate: Secondary | ICD-10-CM | POA: Diagnosis not present

## 2024-07-19 MED ORDER — HALOPERIDOL 1 MG PO TABS
4.0000 mg | ORAL_TABLET | Freq: Every day | ORAL | Status: DC
  Administered 2024-07-19 – 2024-07-25 (×7): 4 mg via ORAL
  Filled 2024-07-19: qty 4
  Filled 2024-07-19 (×2): qty 2
  Filled 2024-07-19 (×4): qty 4

## 2024-07-19 NOTE — Progress Notes (Signed)
   07/19/24 1000  Psych Admission Type (Psych Patients Only)  Admission Status Voluntary  Psychosocial Assessment  Patient Complaints Anxiety;Sleep disturbance  Eye Contact Fair  Facial Expression Animated  Affect Appropriate to circumstance  Speech Logical/coherent  Interaction Assertive  Motor Activity Slow  Appearance/Hygiene Disheveled  Behavior Characteristics Cooperative  Mood Pleasant  Thought Process  Coherency WDL  Content Preoccupation  Delusions None reported or observed  Perception WDL  Hallucination None reported or observed  Judgment Impaired  Confusion Mild  Danger to Self  Current suicidal ideation? Denies  Danger to Others Abnormal  Harmful Behavior to others Threats of violence towards other people observed or expressed   Description of Harmful Behavior pt states thoughts to harm child still present   Pt reports intrusive thoughts to harm child remain. Pt also reports difficulty with sleep last night.

## 2024-07-19 NOTE — [facility] Group Notes (Signed)
 Adult Psychoeducational Group Note  Date:  07/19/2024 Time:  11:14 AM  Group Topic/Focus: Recreation Tefl Teacher a Wellness Toolbox:   The focus of this group is to help patients develop a wellness toolbox with skills and strategies to promote recovery upon discharge.  Participation Level:  Attend group  Participation Quality:    Affect:    Cognitive:    Insight:   Engagement in Group:    Modes of Intervention:    Additional Comments:   Amandamarie Feggins Lee 07/19/2024, 11:14 AM

## 2024-07-19 NOTE — Progress Notes (Signed)
(  Sleep Hours) -8.25  (Any PRNs that were needed, meds refused, or side effects to meds)- none  (Any disturbances and when (visitation, over night)-none  (Concerns raised by the patient)- anxious, not sleeping well  (SI/HI/AVH)-Denies SI/hallucinations. Admits still with thoughts of throwing son in mother-in-law's pool but thoughts are coming less frequently

## 2024-07-19 NOTE — Plan of Care (Signed)
   Problem: Education: Goal: Emotional status will improve Outcome: Progressing Goal: Mental status will improve Outcome: Progressing Goal: Verbalization of understanding the information provided will improve Outcome: Progressing

## 2024-07-19 NOTE — Progress Notes (Addendum)
(  Sleep Hours) - 7.75 (Any PRNs that were needed, meds refused, or side effects to meds) - Hydroxyzine  (Any disturbances and when (visitation, over night) - woke up midnight due to anxiety (Concerns raised by the patient)- anxiety  (SI/HI/AVH)- endorses HI

## 2024-07-19 NOTE — [facility] Group Notes (Signed)
 Adult Psychoeducational Group Note  Date:  07/19/2024 Time:  11:09 AM  Group Topic/Focus: Recreational Therapy Group   Participation Level:  Active  Participation Quality:  Attentive  Affect:  Appropriate  Cognitive:  Appropriate  Insight: Good  Engagement in Group:  Engaged  Modes of Intervention:  Discussion  Additional Comments:    [name] 07/19/2024, 11:09 AM

## 2024-07-19 NOTE — Group Note (Signed)
 Date:  07/19/2024 Time:  5:10 PM  Group Topic/Focus: 5 Love languages Healthy Communication:   The focus of this group is to discuss communication, barriers to communication, as well as healthy ways to communicate with others.    Participation Level:  Active  Participation Quality:  Appropriate  Affect:  Appropriate  Cognitive:  Appropriate  Insight: Appropriate  Engagement in Group:  Engaged  Modes of Intervention:  Discussion and Education  Additional Comments:    Juliene CHRISTELLA Huddle 07/19/2024, 5:10 PM

## 2024-07-19 NOTE — Plan of Care (Signed)

## 2024-07-19 NOTE — Progress Notes (Signed)
 [facility] MD Progress Note  07/19/2024 7:55 AM Kara Stephens  MRN:  [id number]  Principal Problem: Bipolar 1 disorder, mixed, moderate (HCC) Diagnosis: Principal Problem:   Bipolar 1 disorder, mixed, moderate (HCC) Active Problems:   Obsessive compulsive disorder  Total Time spent with patient: 35 minutes   Identifying Information and Past Psychiatric History:  The patient is a 38 y.o. female (domiciled with husband and children,) with a medical history of pre-diabetes and a psychiatric history most consistent with bipolar 1 disorder, OCD and cluster B traits, currently admitted due to high anxiety related to intrusive thoughts of suicide (without actual SI).   Psychiatric history notable for prior bipolar II diagnosis with approximately 5 years of stability on lamotrigine . Symptoms reoccurred with worsening severity after the birth of her second child at age 18. Since then she has had at least 2 credible manic symptoms with psychosis. Symptoms have included racing thoughts, reduced sleep, impulsivity, pressured speech and psychomotor agitation and have included psychotic symptoms such as hallucinations. In addition to elevated mood states, the patient has had recurrent depressive symptoms characterized by low mood, anhedonia, irritability, low energy and negative intrusive thoughts. She has had 4 hospitalizations in the last 2-3 years for depression and mania: March 2025 for mania, February 2025 for mania, December 2023 bipolar mixed episode with catatonic features and OCD. She was most recently admitted from 12/09/23-12/14/23 at [facility] self-admitted largely for anxiety symptoms, feeling that her mania was not well controlled. She has no prior suicide attempts. She has been on numerous medications for management of mood symptoms as noted below with numerous side effects/intolerances or poor effects. She does present with instability in relationships, poor coping, recurrent SI, affective lability concerning  for cluster B traits confounding mood symptoms. Patient has no prior suicide attempts. Currently follows outpatient with Dr. Chyrl Na for med management and she sees Velinda Beat for therapy.   Interval events: The patient was documented by staff to attend and participate in afternoon groups with good engagement. Medication compliant. No behavioral concerns documented; no PRNs required. Did wake up at midnight with anxiety and given hydroxyzine . Documented 7.75 hrs of sleep.  BP (!) 147/83 (BP Location: Right Arm)   Pulse 83   Temp 100 F (37.8 C) (Oral)   Resp 20   Ht 5' 6 (1.676 m)   Wt 108.3 kg   SpO2 98%   BMI 38.54 kg/m    Interview today: Today the patient states she is the same - kind of feel numb. States she has had emotional blunting and is not sure if it is from higher medications or anxiety. Notified her that it could be due to increased seroquel  + haldol . Patient also notes that she has continued to have poor sleep. After discussion about both of these concerns, agreeble to consolidate haldol  to nighttime. Also notified patient that she may need to taper some of these in the outpatient once clomipramine  is a higher level and intrusive thoughts are improved. The intrusive thoughts are still there. They have decreased in frequency and intensity, however she remains scared to return home to be around her son as she feels that they are intrusive thoughts and impulses. Patient is very upset about the thoughts of harming her son as she loves him and believes she is a good mother. She considered him her miracle second child and does not want to be away from him like this but is still too scared to be near him. We discussed at length  today intrusive thoughts being difficult to fully eradicate and recommendation to begin pushing herself to go to more groups. Advised patient she will be moving out of [address] hall to join regular floor. Patient was reluctantly agreeable to this. No SI, HI or  AVH, ongoing intrusive thoughts of ending her own life and harming her son.    Collateral: Husband, Anthony Lomax [phone]  -called again today at 1:16 pm for update. He has been speaking with her and visited her a few times over the last week. He thinks that with her, sometimes it just takes some time with her to get back to herself, as long as the medicines are working. He does think that her mind is her own worse enemy and does not think she would ever harm her child. He did read up on OCD and knows where she is coming from. 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 She has been a good mom, but it does get overwhelming for her. He thinks the not getting as much sleep had a lot to do with it. And he is a toddler now and high energy.   He has noticed over the last few weeks it seems like her speech has gotten better, she does not at all seem like she is in a manic episode, even though she keeps saying she is manic. He does think she is slowly getting better. No longer saying she has to go live with her mom, is making some progress. She did tell him that with the haldol  she was having more trouble sleeping. Other antipsychotic medications haven't worked well for her, so he is not sure about it.    Orie Goldberg, [phone]  Called mom at 1:30 today to provide update. Mom agreed that she will take her for a few days whenever she is ready to be discharged, but they need to come down from [facility] and need a few days notice. This has been so much different from her previous episodes. This is the only one where she has been fearful about the thoughts. Mom does think she was very flat and she seems to have some changes in tone, which is an improvement. Does feel that when her daughter has any type of episode kristin does as well.    Past Medical History:  Past Medical History:  Diagnosis Date   Anxiety    Back  spasm    Bipolar I disorder (HCC)    Borderline personality disorder (HCC)    Class 3 obesity (HCC)    Cluster B personality disorder (HCC)    Hypercholesterolemia    Hypertension    Hypertriglyceridemia    Somatic symptom disorder    Tachycardia    Type 2 diabetes mellitus (HCC)    Vitamin D  insufficiency     Past Surgical History:  Procedure Laterality Date   APPENDECTOMY     CHOLECYSTECTOMY     HAND SURGERY     SHOULDER SURGERY     Family History:  Family History  Problem Relation Age of Onset   Hypertension Mother    Hyperlipidemia Mother    Suicidality Father    Depression Father    Social History:  Social History   Substance and Sexual Activity  Alcohol Use No     Social History   Substance and Sexual Activity  Drug Use No    Social History  Socioeconomic History   Marital status: Married    Spouse name: Not on file   Number of children: Not on file   Years of education: Not on file   Highest education level: Not on file  Occupational History   Not on file  Tobacco Use   Smoking status: Every Day    Current packs/day: 0.50    Average packs/day: 0.5 packs/day for 5.7 years (2.9 ttl pk-yrs)    Types: Cigarettes    Start date: 11/2018    Last attempt to quit: 09/10/2016   Smokeless tobacco: Never  Vaping Use   Vaping status: Never Used  Substance and Sexual Activity   Alcohol use: No   Drug use: No   Sexual activity: Not on file  Other Topics Concern   Not on file  Social History Narrative   Not on file   Social Drivers of Health   Financial Resource Strain: Not on file  Food Insecurity: Food Insecurity Present (07/04/2024)   Hunger Vital Sign    Worried About Running Out of Food in the Last Year: Sometimes true    Ran Out of Food in the Last Year: Never true  Transportation Needs: No Transportation Needs (07/04/2024)   PRAPARE - Administrator, Civil Service (Medical): No    Lack of Transportation (Non-Medical): No  Physical  Activity: Not on file  Stress: Stress Concern Present (11/16/2023)   Received from [facility] - Occupational Stress Questionnaire    Feeling of Stress : Very much  Social Connections: Unknown (01/10/2022)   Received from [facility]   Social Network    Social Network: Not on file    Current Medications: Current Facility-Administered Medications  Medication Dose Route Frequency Provider Last Rate Last Admin   acetaminophen  (TYLENOL ) tablet 650 mg  650 mg Oral Q6H PRN McLauchlin, Jon, NP   650 mg at 07/17/24 1200   alum & mag hydroxide-simeth (MAALOX/MYLANTA) 200-200-20 MG/5ML suspension 30 mL  30 mL Oral Q4H PRN McLauchlin, Angela, NP       clomiPRAMINE  (ANAFRANIL ) capsule 75 mg  75 mg Oral BID Pashayan, Alexander S, DO   75 mg at 07/18/24 1640   clonazePAM  (KLONOPIN ) disintegrating tablet 0.5 mg  0.5 mg Oral QHS Keeven Matty N, MD   0.5 mg at 07/18/24 2044   clonazePAM  (KLONOPIN ) tablet 0.5 mg  0.5 mg Oral BID PRN Towana Leita SAILOR, MD   0.5 mg at 07/17/24 1654   haloperidol  (HALDOL ) tablet 2 mg  2 mg Oral BID Pashayan, Alexander S, DO   2 mg at 07/18/24 2044   haloperidol  (HALDOL ) tablet 5 mg  5 mg Oral Q6H PRN Towana Leita SAILOR, MD       Or   haloperidol  lactate (HALDOL ) injection 5 mg  5 mg Intramuscular Q6H PRN Deriyah Kunath N, MD   5 mg at 07/06/24 1508   hydrOXYzine  (ATARAX ) tablet 25 mg  25 mg Oral TID PRN Pashayan, Alexander S, DO   25 mg at 07/19/24 9862   lamoTRIgine  (LAMICTAL ) tablet 250 mg  250 mg Oral Daily Towana Leita SAILOR, MD   250 mg at 07/18/24 9255   magnesium  hydroxide (MILK OF MAGNESIA) suspension 30 mL  30 mL Oral Daily PRN McLauchlin, Angela, NP       melatonin tablet 3 mg  3 mg Oral QHS Katura Eatherly N, MD   3 mg at 07/18/24 2045   memantine  (NAMENDA ) tablet 10 mg  10 mg Oral BID Sendy Pluta N, MD   10 mg at 07/18/24 1640   metFORMIN  (GLUCOPHAGE ) tablet 500 mg  500 mg Oral Q breakfast Towana Leita SAILOR, MD   500 mg at  07/18/24 0745   propranolol  ER (INDERAL  LA) 24 hr capsule 60 mg  60 mg Oral Daily Kalif Kattner N, MD   60 mg at 07/18/24 0745   QUEtiapine  (SEROQUEL  XR) 24 hr tablet 300 mg  300 mg Oral QHS Wilfred Siverson N, MD   300 mg at 07/18/24 2044   traZODone  (DESYREL ) tablet 50 mg  50 mg Oral QHS Yehoshua Vitelli N, MD   50 mg at 07/18/24 2044    Lab Results:  No results found for this or any previous visit (from the past 48 hours).   Blood Alcohol level:  Lab Results  Component Value Date   [facility] <15 07/04/2024   ETH <10 08/10/2022    Metabolic Disorder Labs: Lab Results  Component Value Date   HGBA1C 5.4 07/04/2024   MPG 108.28 07/04/2024   MPG 119.76 12/07/2023   No results found for: PROLACTIN Lab Results  Component Value Date   CHOL 223 (H) 07/04/2024   TRIG 100 07/04/2024   HDL 53 07/04/2024   CHOLHDL 4.2 07/04/2024   VLDL 20 07/04/2024   LDLCALC 150 (H) 07/04/2024   LDLCALC 92 12/07/2023    Physical Findings:  Mental Status exam: Appearance: white female of slightly elevated BMI, appropriately groomed in casual clothing, seen sitting in her room Eye contact: good  Attitude towards examiner: cooperative, Psychomotor: no agitation or retardation  Speech: normal in prosody; slow and meandering; not pressured  Language: no delays  Mood: alright - I feel numb   Affect: congruent, neutral and fatigued  Thought content: denying SI and HI (reporting intrusive thoughts of harming self and son but no desire or intent to act on thoughts), no delusions expressed  Thought Process: linear, organized and goal-directed  Perception: denying AVH, not RTIS  Insight: fair  Judgement: fair    Orientation: x3 Attention/Concentration: good - attends to interview  Memory/Cognition: grossly intact   Fund of Knowledge: Average      Musculoskeletal: Strength & Muscle Tone: within normal limits Gait & Station: normal Patient leans: N/A    Physical Exam Constitutional:       Appearance: Normal appearance.  HENT:     Head: Normocephalic and atraumatic.  Pulmonary:     Effort: Pulmonary effort is normal.  Abdominal:     General: There is no distension.  Musculoskeletal:        General: Normal range of motion.     Cervical back: Normal range of motion.  Neurological:     General: No focal deficit present.     Mental Status: She is alert.    Review of Systems  All other systems reviewed and are negative.  Blood pressure (!) 147/83, pulse 83, temperature 100 F (37.8 C), temperature source Oral, resp. rate 20, height 5' 6 (1.676 m), weight 108.3 kg, SpO2 98%. Body mass index is 38.54 kg/m.   Treatment Plan Summary: Daily contact with patient to assess and evaluate symptoms and progress in treatment  Assessment: The patient is a 38 y.o. female with a psychiatric history most consistent with bipolar 1 disorder, current episode mixed and cluster B traits. She had carried a bipolar II diagnosis through much of adult life with worsening of symptoms occurring post-partum at age 55. Since then she has had 4 psychiatric  admissions, 2 for credible manic episodes with psychosis. She has additionally presented with recurrent depressive episodes, although no suicide attempts. Mood symptoms are confounded by cluster B traits with clear history of unstable relationships, affective lability, poor coping, recurrent SI. On this admission the patient presents largely with concerns related to high anxiety and intrusive thoughts. Although reporting concerns for mania, aside from mildly reduced sleep (5 hrs) and rapidly cycling thoughts she does not present with endorsed active manic symptoms and does not present with observable signs of mania.  Given this admission was precipitated by several recent psychosocial stressors, personality pathology may be contributing to worsening of symptoms  to some degree. Regardless, she has endorsed significant distress with current intrusive  thoughts of harm to self/others, overall high anxiety and urges to count to reduce this more consistent with an OCD picture.     The patient has had minimal improvements in subjective intrusive thoughts (sometimes described as hallucinations although with no additional objective evidence of psychosis) despite ongoign medication changes, including addition of haldol  2 mg BID. She did not possible decrease in frequency and was documented to attend a group with better engagement yesterday. Clomipramine  was increased to 75 mg BID as of 11/10. Over the past 2 days the patient has continued to report ongoing intrusive thoughts that had decreased in frequency and today noted decrease in severity. Does feel that sleep has worsened since adding haldol . Will consolidate to evening dose and if this does not improve sleep will plan to discontinue it. Likely increase clomipramine  again tomorrow or the next day. This afternoon she will move to front hall and has been encouraged to go to groups and begin to push through some of these intrusive thoughts. Patient was reluctantly agreeable to this plan. Both husband and mom have been updated.      DSM-5 diagnoses: Bipolar I disorder, current episode mixed, no current psychotic features  Obsessive-Compulsive disorder  Cluster B traits              -r/o BPD   Plan:   Legal Status: -voluntary    Safety -q15 minute checks  -elopement, suicide and assault precautions  -daily vitals   Psychiatric Concerns  -Continue Lamotrigine  250 mg daily for mood  -Continue Seroquel  XR to 300 mg at bedtime for sleep and mood stabilization  -Continue Namenda  10 mg BID for reported intrusive thoughts (patient takes at home and reports benefit) -Continue Trazodone  50 mg at bedtime for sleep -Continue haldol  2 mg BID to psychosis/mood stability -Continue Clomipramine  75 mg BID for intrusive thoughts related to OCD -Continue Klonopin  0.5 mg BID PRN for anxiety + scheduled 0.5  mg at bedtime for sleep   -patient notified klonopin  will not be continued after hospital and to reserve for severe anxiety only -Continue Propranolol  ER 60 mg daily  -Continue PRN haldol  5 mg PO or IM q6h for agitation    Substance use concerns  -none currently    Nicotine  Replacement  N/A    Medical concerns Pre-diabetes -Current A1c WNL (5.4) but was elevated 7 months ago prior to beginning metformin  -Continue metformin  500 mg daily      Additional PRNs: -Tylenol  tablets 650 mg every 6 hours as needed for pain -Maalox/Mylanta suspension 30 mL every 4 hours as needed for indigestion  -Milk of Magnesia 30 mL daily as needed for constipation   Labs -Reviewed as documented in HPI   Psychosocial interventions  -daily medication management with psychiatry -Medication education regarding risks/benefits and  alternatives -bedside psychotherapy as indicated  -Patient will be encouraged to participate and engage with group therapy  -Appreciate SW assistance in coordinating safe disposition    Leita LOISE Arts, MD 07/19/2024, 7:55 AM

## 2024-07-19 NOTE — Progress Notes (Signed)
 Spiritual care group facilitated by Chaplain Rockie Sofia, BCC and Alan Lesches, Mdiv  Group focused on topic of strength. Group members reflected on what thoughts and feelings emerge when they hear this topic. They then engaged in facilitated dialog around how strength is present in their lives. This dialog focused on representing what strength had been to them in their lives (images and patterns given) and what they saw as helpful in their life now (what they needed / wanted).  Activity drew on narrative framework.  Patient Progress: Kara Stephens attended group and actively engaged and participated in group conversation and activities.  Comments demonstrated good insight and contributed positively to the group conversation.

## 2024-07-19 NOTE — BH IP Treatment Plan (Signed)
 Interdisciplinary Treatment and Diagnostic Plan Update  07/19/2024 Time of Session: THIS IS AN UPDATE Kara Stephens MRN: [id number]  Principal Diagnosis: Bipolar 1 disorder, mixed, moderate (HCC)  Secondary Diagnoses: Principal Problem:   Bipolar 1 disorder, mixed, moderate (HCC) Active Problems:   Obsessive compulsive disorder   Current Medications:  Current Facility-Administered Medications  Medication Dose Route Frequency Provider Last Rate Last Admin   acetaminophen  (TYLENOL ) tablet 650 mg  650 mg Oral Q6H PRN McLauchlin, Jon, NP   650 mg at 07/17/24 1200   alum & mag hydroxide-simeth (MAALOX/MYLANTA) 200-200-20 MG/5ML suspension 30 mL  30 mL Oral Q4H PRN McLauchlin, Angela, NP       clomiPRAMINE  (ANAFRANIL ) capsule 75 mg  75 mg Oral BID Pashayan, Alexander S, DO   75 mg at 07/19/24 9192   clonazePAM  (KLONOPIN ) disintegrating tablet 0.5 mg  0.5 mg Oral QHS Butler, Laura N, MD   0.5 mg at 07/18/24 2044   clonazePAM  (KLONOPIN ) tablet 0.5 mg  0.5 mg Oral BID PRN Towana Leita SAILOR, MD   0.5 mg at 07/17/24 1654   haloperidol  (HALDOL ) tablet 4 mg  4 mg Oral QHS Towana Leita SAILOR, MD       haloperidol  (HALDOL ) tablet 5 mg  5 mg Oral Q6H PRN Towana Leita SAILOR, MD       Or   haloperidol  lactate (HALDOL ) injection 5 mg  5 mg Intramuscular Q6H PRN Butler, Laura N, MD   5 mg at 07/06/24 1508   hydrOXYzine  (ATARAX ) tablet 25 mg  25 mg Oral TID PRN Pashayan, Alexander S, DO   25 mg at 07/19/24 9862   lamoTRIgine  (LAMICTAL ) tablet 250 mg  250 mg Oral Daily Towana Leita SAILOR, MD   250 mg at 07/19/24 9191   magnesium  hydroxide (MILK OF MAGNESIA) suspension 30 mL  30 mL Oral Daily PRN McLauchlin, Angela, NP       melatonin tablet 3 mg  3 mg Oral QHS Towana Leita SAILOR, MD   3 mg at 07/18/24 2045   memantine  (NAMENDA ) tablet 10 mg  10 mg Oral BID Towana Leita SAILOR, MD   10 mg at 07/19/24 9192   metFORMIN  (GLUCOPHAGE ) tablet 500 mg  500 mg Oral Q breakfast Towana Leita SAILOR, MD   500 mg at 07/19/24 9191    propranolol  ER (INDERAL  LA) 24 hr capsule 60 mg  60 mg Oral Daily Butler, Laura N, MD   60 mg at 07/19/24 9191   QUEtiapine  (SEROQUEL  XR) 24 hr tablet 300 mg  300 mg Oral QHS Butler, Laura N, MD   300 mg at 07/18/24 2044   traZODone  (DESYREL ) tablet 50 mg  50 mg Oral QHS Butler, Laura N, MD   50 mg at 07/18/24 2044   PTA Medications: Medications Prior to Admission  Medication Sig Dispense Refill Last Dose/Taking   albuterol  (VENTOLIN  HFA) 108 (90 Base) MCG/ACT inhaler Inhale 2 puffs into the lungs every 4 (four) hours as needed for wheezing or shortness of breath (cough, shortness of breath or wheezing.). (Patient not taking: Reported on 07/04/2024) 6.7 g 3    aspirin EC 81 MG tablet Take 81 mg by mouth daily. Swallow whole.      Cholecalciferol  (VITAMIN D -3 PO) Take 1 capsule by mouth daily.      clonazePAM  (KLONOPIN ) 0.5 MG tablet Take 0.5 mg by mouth daily as needed for anxiety.      lamoTRIgine  (LAMICTAL ) 200 MG tablet Take 200 mg by mouth daily.  memantine  (NAMENDA ) 10 MG tablet Take 1 tablet (10 mg total) by mouth every 12 (twelve) hours. 60 tablet 0    metFORMIN  (GLUCOPHAGE -XR) 500 MG 24 hr tablet Take 1 tablet (500 mg total) by mouth daily with breakfast. (Patient taking differently: Take 1,000 mg by mouth daily with breakfast.) 30 tablet 0    Multiple Vitamin (MULTIVITAMIN WITH MINERALS) TABS tablet Take 1 tablet by mouth daily.      propranolol  ER (INDERAL  LA) 60 MG 24 hr capsule Take 1 capsule (60 mg total) by mouth daily. 30 capsule 0    QUEtiapine  (SEROQUEL ) 200 MG tablet Take 200 mg by mouth at bedtime.      traZODone  (DESYREL ) 100 MG tablet Take 1 tablet (100 mg total) by mouth at bedtime as needed for sleep. (Patient taking differently: Take 100-200 mg by mouth at bedtime.) 30 tablet 0     Patient Stressors: Traumatic event    Patient Strengths: Capable of independent living  Manufacturing systems engineer  Motivation for treatment/growth  Supportive family/friends   Treatment  Modalities: Medication Management, Group therapy, Case management,  1 to 1 session with clinician, Psychoeducation, Recreational therapy.   Physician Treatment Plan for Primary Diagnosis: Bipolar 1 disorder, mixed, moderate (HCC) Long Term Goal(s):     Short Term Goals:    Medication Management: Evaluate patient's response, side effects, and tolerance of medication regimen.  Therapeutic Interventions: 1 to 1 sessions, Unit Group sessions and Medication administration.  Evaluation of Outcomes: Progressing  Physician Treatment Plan for Secondary Diagnosis: Principal Problem:   Bipolar 1 disorder, mixed, moderate (HCC) Active Problems:   Obsessive compulsive disorder  Long Term Goal(s):     Short Term Goals:       Medication Management: Evaluate patient's response, side effects, and tolerance of medication regimen.  Therapeutic Interventions: 1 to 1 sessions, Unit Group sessions and Medication administration.  Evaluation of Outcomes: Progressing   RN Treatment Plan for Primary Diagnosis: Bipolar 1 disorder, mixed, moderate (HCC) Long Term Goal(s): Knowledge of disease and therapeutic regimen to maintain health will improve  Short Term Goals: Ability to demonstrate self-control, Ability to participate in decision making will improve, Ability to verbalize feelings will improve, and Ability to disclose and discuss suicidal ideas  Medication Management: RN will administer medications as ordered by provider, will assess and evaluate patient's response and provide education to patient for prescribed medication. RN will report any adverse and/or side effects to prescribing provider.  Therapeutic Interventions: 1 on 1 counseling sessions, Psychoeducation, Medication administration, Evaluate responses to treatment, Monitor vital signs and CBGs as ordered, Perform/monitor CIWA, COWS, AIMS and Fall Risk screenings as ordered, Perform wound care treatments as ordered.  Evaluation of  Outcomes: Progressing   LCSW Treatment Plan for Primary Diagnosis: Bipolar 1 disorder, mixed, moderate (HCC) Long Term Goal(s): Safe transition to appropriate next level of care at discharge, Engage patient in therapeutic group addressing interpersonal concerns.  Short Term Goals: Engage patient in aftercare planning with referrals and resources, Increase social support, Increase ability to appropriately verbalize feelings, and Increase emotional regulation  Therapeutic Interventions: Assess for all discharge needs, 1 to 1 time with Social worker, Explore available resources and support systems, Assess for adequacy in community support network, Educate family and significant other(s) on suicide prevention, Complete Psychosocial Assessment, Interpersonal group therapy.  Evaluation of Outcomes: Progressing   Progress in Treatment: Attending groups: Yes. Participating in groups: Yes. Taking medication as prescribed: Yes. Toleration medication: Yes. Family/Significant other contact made: Yes. contacted:  Curtistine Hochman (  husband) [phone] and Boby Goldberg (mom) [phone]  Patient understands diagnosis: Yes. Discussing patient identified problems/goals with staff: Yes. Medical problems stabilized or resolved: Yes. Denies suicidal/homicidal ideation: No. HI and SI. HI towards roommate and others on the unit  Issues/concerns per patient self-inventory: No.   New problem(s) identified: Yes, Describe:  pt requesting a room on [address], constant supervision or to spend time in the quiet room   New Short Term/Long Term Goal(s): medication stabilization, elimination of SI thoughts, development of comprehensive mental wellness plan.      Patient Goals:  Get out of the manic state, lessen the intrusive thoughts of wanting to choke out my roommate, and increase my sleep   Discharge Plan or Barriers: Patient recently admitted. CSW will continue to follow and assess for appropriate referrals  and possible discharge planning.      Reason for Continuation of Hospitalization: Homicidal ideation Medication stabilization Suicidal ideation Other; describe bipolar   Estimated Length of Stay: 3-5 days  Last 3 [place] Suicide Severity Risk Score: Flowsheet Row [facility]Current[facility] Most recent reading at 07/04/2024 10:27 PM ED from 07/04/2024 in [facility] Most recent reading at 07/04/2024  3:43 PM Admission (Discharged) [facility] Most recent reading at 12/09/2023  3:30 PM  C-SSRS RISK CATEGORY Moderate Risk Moderate Risk No Risk    Last PHQ 2/9 Scores:    08/10/2022    7:45 PM  Depression screen PHQ 2/9  Decreased Interest 3  Down, Depressed, Hopeless 3  PHQ - 2 Score 6  Altered sleeping 3  Tired, decreased energy 1  Change in appetite 3  Feeling bad or failure about yourself  1  Trouble concentrating 2  Moving slowly or fidgety/restless 2  Suicidal thoughts 2  PHQ-9 Score 20   Difficult doing work/chores Very difficult     Data saved with a previous flowsheet row definition    Scribe for Treatment Team: Jenkins LULLA Primer, LCSWA 07/19/2024 1:46 PM

## 2024-07-19 NOTE — [facility] Group Notes (Signed)
 Adult Psychoeducational Group Note  Date:  07/19/2024 Time:  9:22 AM  Group Topic/Focus: Orentation Orientation:   The focus of this group is to educate the patient on the purpose and policies of crisis stabilization and provide a format to answer questions about their admission.  The group details unit policies and expectations of patients while admitted.  Participation Level:  Active  Participation Quality:  Appropriate  Affect:  Appropriate  Cognitive:  Appropriate  Insight: Appropriate  Engagement in Group:  Engaged  Modes of Intervention:  Discussion  Additional Comments:  The patient engaged in group.  Leilyn Frayre Lee 07/19/2024, 9:22 AM

## 2024-07-19 NOTE — Group Note (Signed)
 Recreation Therapy Group Note   Group Topic:Anger Management  Group Date: 07/19/2024 Start Time: 1025 End Time: 1052 Facilitators: Gerry Heaphy-McCall, LRT,CTRS Location: [address] Dayroom   Group Topic: Anger Management  Goal Area(s) Addresses:  Patient will identify triggers for anger.  Patient will identify positive coping skills to deal with anger.    Behavioral Response: Engaged  Intervention: Conversation with group, and worksheets  Activity: Anger Thermometer. LRT and patients talked about anger and the causes of it. Patients were given a worksheet with an anger thermometer rating anger on a scale of 1-5. Each number represented a level of anger (slightly irritated, irritated, angry, really angry and enraged). Patients were to identify what gets them angry at each level. Patients were to then identify coping skills they use to help deal with their anger.    Education: Anger Management, Discharge Planning   Education Outcome: Acknowledges education/In group clarification offered/Needs additional education.    Affect/Mood: Appropriate   Participation Level: Engaged   Participation Quality: Independent   Behavior: Appropriate   Speech/Thought Process: Focused   Insight: Good   Judgement: Good   Modes of Intervention: Worksheet   Patient Response to Interventions:  Engaged   Education Outcome:  In group clarification offered    Clinical Observations/Individualized Feedback: Pt was engaged and focused during group. Pt ranked her anger as 1- bickering, 2- someone being loud, 3- someone being self-centered, 4- someone yelling at her/being loud and 5- her children being messed with. Pt identified her coping skills as walk away, deep breathing, count, talk things through and ignore them. Pt expressed during processing, how she would react depended on who it was that was making her angry. Pt did emphasize that if someone was messing with her children, there was no  hesitation in using negative coping skills to deal with it.    Plan: Continue to engage patient in RT group sessions 2-3x/week.   Kedra Mcglade-McCall, LRT,CTRS 07/19/2024 11:50 AM

## 2024-07-19 NOTE — Group Note (Signed)
 Date:  07/19/2024 Time:  4:18 PM  Group Topic/Focus: Spiritual Wellness (By [employer])   Pt did attend spiritual wellness group  Kara Stephens D Kara Stephens 07/19/2024, 4:18 PM

## 2024-07-19 NOTE — Plan of Care (Signed)
  Problem: Education: Goal: Verbalization of understanding the information provided will improve Outcome: Progressing   Problem: Activity: Goal: Interest or engagement in activities will improve Outcome: Progressing   Problem: Activity: Goal: Sleeping patterns will improve Outcome: Not Progressing

## 2024-07-20 DIAGNOSIS — F429 Obsessive-compulsive disorder, unspecified: Secondary | ICD-10-CM | POA: Diagnosis not present

## 2024-07-20 DIAGNOSIS — F3162 Bipolar disorder, current episode mixed, moderate: Secondary | ICD-10-CM | POA: Diagnosis not present

## 2024-07-20 MED ORDER — CLOMIPRAMINE HCL 25 MG PO CAPS
100.0000 mg | ORAL_CAPSULE | Freq: Every day | ORAL | Status: DC
  Administered 2024-07-20: 100 mg via ORAL
  Filled 2024-07-20: qty 4

## 2024-07-20 MED ORDER — NYSTATIN 100000 UNIT/GM EX CREA
TOPICAL_CREAM | Freq: Two times a day (BID) | CUTANEOUS | Status: DC
  Administered 2024-07-22 – 2024-07-23 (×4): 1 via TOPICAL
  Filled 2024-07-20: qty 30

## 2024-07-20 MED ORDER — NYSTATIN 100000 UNIT/GM EX POWD
Freq: Three times a day (TID) | CUTANEOUS | Status: DC | PRN
  Administered 2024-07-20 (×2): 1 via TOPICAL
  Filled 2024-07-20 (×2): qty 15

## 2024-07-20 NOTE — Group Note (Signed)
 Occupational Therapy Group Note  Group Topic: Sleep Hygiene  Group Date: 07/20/2024 Start Time: 1500 End Time: 1530 Facilitators: Dot [place] MATSU, OT   Group Description: Group encouraged increased participation and engagement through topic focused on sleep hygiene. Patients reflected on the quality of sleep they typically receive and identified areas that need improvement. Group was given background information on sleep and sleep hygiene, including common sleep disorders. Group members also received information on how to improve one's sleep and introduced a sleep diary as a tool that can be utilized to track sleep quality over a length of time. Group session ended with patients identifying one or more strategies they could utilize or implement into their sleep routine in order to improve overall sleep quality.        Therapeutic Goal(s):  Identify one or more strategies to improve overall sleep hygiene  Identify one or more areas of sleep that are negatively impacted (sleep too much, too little, etc)     Participation Level: Engaged   Participation Quality: Independent   Behavior: Appropriate   Speech/Thought Process: Relevant   Affect/Mood: Appropriate   Insight: Fair   Judgement: Fair      Modes of Intervention: Education  Patient Response to Interventions:  Attentive   Plan: Continue to engage patient in OT groups 2 - 3x/week.  07/20/2024  [place] MATSU Dot, OT   Kara Stephens, OT

## 2024-07-20 NOTE — [facility] Suicide Risk Assessment (Signed)
 Collateral contact - Boby Goldberg (mom) [phone]   Mom will pick up patient on Wednesday, 07/26/2024 at 10:45 AM.   Taylor Levick, LCSWA 07/20/2024

## 2024-07-20 NOTE — Plan of Care (Signed)
   Problem: Activity: Goal: Interest or engagement in activities will improve Outcome: Progressing Goal: Sleeping patterns will improve Outcome: Progressing

## 2024-07-20 NOTE — [facility] Group Notes (Signed)
 Patient attended Nutrition Group.

## 2024-07-20 NOTE — Group Note (Signed)
 Date:  07/20/2024 Time:  10:30 AM  Group Topic/Focus:  Goals Group:   The focus of this group is to help patients establish daily goals to achieve during treatment and discuss how the patient can incorporate goal setting into their daily lives to aide in recovery.    Participation Level:  None  Participation Quality:  Appropriate  Affect:  Appropriate  Cognitive:  Alert  Insight: None  Engagement in Group:  None  Modes of Intervention:  Discussion  Additional Comments:  Patient attended group but did not participate.  [name] 07/20/2024, 10:30 AM

## 2024-07-20 NOTE — Progress Notes (Signed)
 [facility] MD Progress Note  07/20/2024 8:57 AM Kara Stephens  MRN:  [id number]  Principal Problem: Bipolar 1 disorder, mixed, moderate (HCC) Diagnosis: Principal Problem:   Bipolar 1 disorder, mixed, moderate (HCC) Active Problems:   Obsessive compulsive disorder  Total Time spent with patient: 35 minutes   Identifying Information and Past Psychiatric History:  The patient is a 38 y.o. female (domiciled with husband and children,) with a medical history of pre-diabetes and a psychiatric history most consistent with bipolar 1 disorder, OCD and cluster B traits, currently admitted due to high anxiety related to intrusive thoughts of suicide (without actual SI).   Psychiatric history notable for prior bipolar II diagnosis with approximately 5 years of stability on lamotrigine . Symptoms reoccurred with worsening severity after the birth of her second child at age 39. Since then she has had at least 2 credible manic symptoms with psychosis. Symptoms have included racing thoughts, reduced sleep, impulsivity, pressured speech and psychomotor agitation and have included psychotic symptoms such as hallucinations. In addition to elevated mood states, the patient has had recurrent depressive symptoms characterized by low mood, anhedonia, irritability, low energy and negative intrusive thoughts. She has had 4 hospitalizations in the last 2-3 years for depression and mania: March 2025 for mania, February 2025 for mania, December 2023 bipolar mixed episode with catatonic features and OCD. She was most recently admitted from 12/09/23-12/14/23 at [facility] self-admitted largely for anxiety symptoms, feeling that her mania was not well controlled. She has no prior suicide attempts. She has been on numerous medications for management of mood symptoms as noted below with numerous side effects/intolerances or poor effects. She does present with instability in relationships, poor coping, recurrent SI, affective lability concerning  for cluster B traits confounding mood symptoms. Patient has no prior suicide attempts. Currently follows outpatient with Dr. Chyrl Na for med management and she sees Velinda Beat for therapy.   Interval events: The patient was moved to 400 hall yesterday to better benefit from regular groups, which she attended with good engagement. Slept 8.25 hrs overnight eper staff. Denying SI, HI and AVH, continuing to voice intrusive thoughts about throwing son into pool but no desire to act on this. No behavioral concerns and medication compliant. BP 119/76 (BP Location: Left Arm)   Pulse 80   Temp 98.5 F (36.9 C) (Oral)   Resp 20   Ht 5' 6 (1.676 m)   Wt 108.3 kg   SpO2 98%   BMI 38.54 kg/m    Interview today: Today the patient states she is good. Reports she is still having some of the emotional numbing, but doesn't necessarily feel in a bad mood. States she did sleep a bit better last night. Woke up early, but it was closer to 5 am than 2 am, which was an improvement. States she had a difficult time adjusting to the unit yesterday, but she is feeling better about it today. More comfortable than when she came in. Intrusive thoughts are not about any of the people around her. No longer having intrusive thoughts about harming herself, but continuing to have the thought about throwing her son in the pool. It remains disturbing, but she is overall feeling it is slightly easier to push away. Does think it is mild incremental progress. She is not sure if it is due to the haldol  or just the clomipramine  setting in, or time. Is somewhat more hopeful about her progress today. No SI, HI AVH. No additional concerns voiced.  Collateral: Husband, Samia Kukla [phone] - last updated 11/12 Orie Goldberg, [phone]  -last updated 11/12   Past Medical History:  Past Medical History:  Diagnosis Date   Anxiety    Back spasm    Bipolar I disorder (HCC)    Borderline personality disorder (HCC)     Class 3 obesity (HCC)    Cluster B personality disorder (HCC)    Hypercholesterolemia    Hypertension    Hypertriglyceridemia    Somatic symptom disorder    Tachycardia    Type 2 diabetes mellitus (HCC)    Vitamin D  insufficiency     Past Surgical History:  Procedure Laterality Date   APPENDECTOMY     CHOLECYSTECTOMY     HAND SURGERY     SHOULDER SURGERY     Family History:  Family History  Problem Relation Age of Onset   Hypertension Mother    Hyperlipidemia Mother    Suicidality Father    Depression Father    Social History:  Social History   Substance and Sexual Activity  Alcohol Use No     Social History   Substance and Sexual Activity  Drug Use No    Social History   Socioeconomic History   Marital status: Married    Spouse name: Not on file   Number of children: Not on file   Years of education: Not on file   Highest education level: Not on file  Occupational History   Not on file  Tobacco Use   Smoking status: Every Day    Current packs/day: 0.50    Average packs/day: 0.5 packs/day for 5.7 years (2.9 ttl pk-yrs)    Types: Cigarettes    Start date: 11/2018    Last attempt to quit: 09/10/2016   Smokeless tobacco: Never  Vaping Use   Vaping status: Never Used  Substance and Sexual Activity   Alcohol use: No   Drug use: No   Sexual activity: Not on file  Other Topics Concern   Not on file  Social History Narrative   Not on file   Social Drivers of Health   Financial Resource Strain: Not on file  Food Insecurity: Food Insecurity Present (07/04/2024)   Hunger Vital Sign    Worried About Running Out of Food in the Last Year: Sometimes true    Ran Out of Food in the Last Year: Never true  Transportation Needs: No Transportation Needs (07/04/2024)   PRAPARE - Administrator, Civil Service (Medical): No    Lack of Transportation (Non-Medical): No  Physical Activity: Not on file  Stress: Stress Concern Present (11/16/2023)   Received  from [facility] - Occupational Stress Questionnaire    Feeling of Stress : Very much  Social Connections: Not on file    Current Medications: Current Facility-Administered Medications  Medication Dose Route Frequency Provider Last Rate Last Admin   acetaminophen  (TYLENOL ) tablet 650 mg  650 mg Oral Q6H PRN McLauchlin, Angela, NP   650 mg at 07/17/24 1200   alum & mag hydroxide-simeth (MAALOX/MYLANTA) 200-200-20 MG/5ML suspension 30 mL  30 mL Oral Q4H PRN McLauchlin, Angela, NP       clomiPRAMINE  (ANAFRANIL ) capsule 75 mg  75 mg Oral BID Pashayan, Alexander S, DO   75 mg at 07/20/24 0813   clonazePAM  (KLONOPIN ) disintegrating tablet 0.5 mg  0.5 mg Oral QHS Towana Leita SAILOR, MD   0.5 mg at 07/19/24 2108   clonazePAM  (KLONOPIN )  tablet 0.5 mg  0.5 mg Oral BID PRN Krishauna Schatzman N, MD   0.5 mg at 07/19/24 1709   haloperidol  (HALDOL ) tablet 4 mg  4 mg Oral QHS Miku Udall N, MD   4 mg at 07/19/24 2108   haloperidol  (HALDOL ) tablet 5 mg  5 mg Oral Q6H PRN Towana Leita SAILOR, MD       Or   haloperidol  lactate (HALDOL ) injection 5 mg  5 mg Intramuscular Q6H PRN Ronalda Walpole N, MD   5 mg at 07/06/24 1508   hydrOXYzine  (ATARAX ) tablet 25 mg  25 mg Oral TID PRN Pashayan, Alexander S, DO   25 mg at 07/19/24 9862   lamoTRIgine  (LAMICTAL ) tablet 250 mg  250 mg Oral Daily Towana Leita SAILOR, MD   250 mg at 07/20/24 9185   magnesium  hydroxide (MILK OF MAGNESIA) suspension 30 mL  30 mL Oral Daily PRN McLauchlin, Angela, NP       melatonin tablet 3 mg  3 mg Oral QHS Giomar Gusler N, MD   3 mg at 07/19/24 2108   memantine  (NAMENDA ) tablet 10 mg  10 mg Oral BID Slade Pierpoint N, MD   10 mg at 07/20/24 0813   metFORMIN  (GLUCOPHAGE ) tablet 500 mg  500 mg Oral Q breakfast Towana Leita SAILOR, MD   500 mg at 07/20/24 0813   propranolol  ER (INDERAL  LA) 24 hr capsule 60 mg  60 mg Oral Daily Elmina Hendel N, MD   60 mg at 07/20/24 0813   QUEtiapine  (SEROQUEL  XR) 24 hr tablet 300 mg   300 mg Oral QHS Jeannia Tatro N, MD   300 mg at 07/19/24 2108   traZODone  (DESYREL ) tablet 50 mg  50 mg Oral QHS Larie Mathes N, MD   50 mg at 07/19/24 2108    Lab Results:  No results found for this or any previous visit (from the past 48 hours).   Blood Alcohol level:  Lab Results  Component Value Date   [facility] <15 07/04/2024   ETH <10 08/10/2022    Metabolic Disorder Labs: Lab Results  Component Value Date   HGBA1C 5.4 07/04/2024   MPG 108.28 07/04/2024   MPG 119.76 12/07/2023   No results found for: PROLACTIN Lab Results  Component Value Date   CHOL 223 (H) 07/04/2024   TRIG 100 07/04/2024   HDL 53 07/04/2024   CHOLHDL 4.2 07/04/2024   VLDL 20 07/04/2024   LDLCALC 150 (H) 07/04/2024   LDLCALC 92 12/07/2023    Physical Findings:  Mental Status exam: Appearance: white female of slightly elevated BMI, appropriately groomed in casual clothing, seen sitting in the day room with peers Eye contact: good  Attitude towards examiner: cooperative, pleasant  Psychomotor: no agitation or retardation  Speech: normal in prosody; slow and meandering Language: no delays  Mood: good, still blunted   Affect: congruent, appears fatigued again today but is somewhat more reactive - smiling at certain times  Thought content: denying SI and HI (reporting intrusive thoughts of harming self and son but no desire or intent to act on thoughts), no delusions expressed  Thought Process: linear, organized and goal-directed  Perception: denying AVH, not RTIS  Insight: fair  Judgement: fair    Orientation: x3 Attention/Concentration: good - attends to interview  Memory/Cognition: grossly intact   Fund of Knowledge: Average      Musculoskeletal: Strength & Muscle Tone: within normal limits Gait & Station: normal Patient leans: N/A    Physical Exam Constitutional:  Appearance: Normal appearance.  HENT:     Head: Normocephalic and atraumatic.  Pulmonary:     Effort:  Pulmonary effort is normal.  Abdominal:     General: There is no distension.  Musculoskeletal:        General: Normal range of motion.     Cervical back: Normal range of motion.  Neurological:     General: No focal deficit present.     Mental Status: She is alert.    Review of Systems  All other systems reviewed and are negative.  Blood pressure 119/76, pulse 80, temperature 98.5 F (36.9 C), temperature source Oral, resp. rate 20, height 5' 6 (1.676 m), weight 108.3 kg, SpO2 98%. Body mass index is 38.54 kg/m.   Treatment Plan Summary: Daily contact with patient to assess and evaluate symptoms and progress in treatment  Assessment: The patient is a 38 y.o. female with a psychiatric history most consistent with bipolar 1 disorder, current episode mixed and cluster B traits. She had carried a bipolar II diagnosis through much of adult life with worsening of symptoms occurring post-partum at age 76. Since then she has had 4 psychiatric admissions, 2 for credible manic episodes with psychosis. She has additionally presented with recurrent depressive episodes, although no suicide attempts. Mood symptoms are confounded by cluster B traits with clear history of unstable relationships, affective lability, poor coping, recurrent SI. On this admission the patient presents largely with concerns related to high anxiety and intrusive thoughts. Although reporting concerns for mania, aside from mildly reduced sleep (5 hrs) and rapidly cycling thoughts she does not present with endorsed active manic symptoms and does not present with observable signs of mania.  Given this admission was precipitated by several recent psychosocial stressors, personality pathology may be contributing to worsening of symptoms  to some degree. Regardless, she has endorsed significant distress with current intrusive thoughts of harm to self/others, overall high anxiety and urges to count to reduce this more consistent with an OCD  picture.     The patient has had minimal improvements in subjective intrusive thoughts (sometimes described as hallucinations although with no additional objective evidence of psychosis) despite ongoign medication changes, including addition of haldol  2 mg BID. She did not possible decrease in frequency and was documented to attend a group with better engagement yesterday. Clomipramine  was increased to 75 mg BID as of 11/10. Over the past 2-3 days the patient has reported intrusive thoughts as less frequent and less severe. Yesterday continued to voice subjective poor sleep and emotional numbing so haldol  was consolidated to evening time. She was move to regular floor to participate in groups and engage more with peers.   Today was voicing improvements in mood and slightly brighter in affect, although continued to note emotional blunting. Intrusive thoughts again less frequent and less severe. Will plan to increase evening clomipramine  to 100 mg tonight and then consolidate dose to 200 mg total for tomorrow (instead of BID) to address daytime fatigue/blunting and to further improve sleep. Will re-evaluate need for haldol  after this.      DSM-5 diagnoses: Bipolar I disorder, current episode mixed, no current psychotic features  Obsessive-Compulsive disorder  Cluster B traits              -r/o BPD   Plan:   Legal Status: -voluntary    Safety -q15 minute checks  -elopement, suicide and assault precautions  -daily vitals   Psychiatric Concerns  -Continue Lamotrigine  250 mg daily for mood  -  Continue Seroquel  XR to 300 mg at bedtime for sleep and mood stabilization  -Continue Namenda  10 mg BID for reported intrusive thoughts (patient takes at home and reports benefit) -Continue Trazodone  50 mg at bedtime for sleep -Continue haldol  2 mg BID to psychosis/mood stability -Increase Clomipramine  to 100 mg tonight; plan to consolidate dose to 200 mg tomorrow for intrusive thoughts related to  OCD -Continue Klonopin  0.5 mg BID PRN for anxiety + scheduled 0.5 mg at bedtime for sleep   -patient notified klonopin  will not be continued after hospital and to reserve for severe anxiety only -Continue Propranolol  ER 60 mg daily  -Continue PRN haldol  5 mg PO or IM q6h for agitation    Substance use concerns  -none currently    Nicotine  Replacement  N/A    Medical concerns Pre-diabetes -Current A1c WNL (5.4) but was elevated 7 months ago prior to beginning metformin  -Continue metformin  500 mg daily      Additional PRNs: -Tylenol  tablets 650 mg every 6 hours as needed for pain -Maalox/Mylanta suspension 30 mL every 4 hours as needed for indigestion  -Milk of Magnesia 30 mL daily as needed for constipation   Labs -Reviewed as documented in HPI   Psychosocial interventions  -daily medication management with psychiatry -Medication education regarding risks/benefits and alternatives -bedside psychotherapy as indicated  -Patient will be encouraged to participate and engage with group therapy  -Appreciate SW assistance in coordinating safe disposition    Leita LOISE Arts, MD 07/20/2024, 8:57 AM

## 2024-07-20 NOTE — [facility] Group Notes (Signed)
 Patient attended Social Work Group.

## 2024-07-20 NOTE — Group Note (Signed)
 LCSW Group Therapy Note   Group Date: 07/20/2024 Start Time: 1100 End Time: 1200   Participation:  patient was present.  Patient listened and was respectful but didn't participate in the discussion.  Type of Therapy:  Group Therapy   Topic:  Healing From Within: Understanding Our Past, Building Our Future"  Objective:  To help participants understand the impact of early experiences on mental and physical health, with a focus on Adverse Childhood Experiences (ACEs), and to explore ways to build resilience and healing.  Group Goals: - Understand ACEs and Their Impact: Learn how childhood experiences shape mental and physical health. - Build Resilience: Develop strategies for overcoming challenges and creating positive change. - Promote Healing: Recognize the value of support and the possibility of healing through therapy and self-care.  Summary: In today's session, we discussed how early experiences, especially ACEs, impact mental and physical health. We explored the effects of stress, abuse, and neglect on brain development and well-being. The group focused on resilience, understanding that healing and positive change are possible with support and self-awareness.  Therapeutic Modalities Used: - Psychoeducation: Sharing information about ACEs and their effects. - Cognitive Behavioral Therapy (CBT): Helping reframe negative thought patterns. - Trauma-Informed Therapy: Creating a safe, supportive space for healing.   Erandy Mceachern O Huxton Glaus, LCSWA 07/20/2024  12:32 PM

## 2024-07-20 NOTE — [facility] Group Notes (Signed)
 Patient attended Occupational Therapy Group.

## 2024-07-20 NOTE — Group Note (Signed)
 Date:  07/20/2024 Time:  5:22 PM  Group Topic/Focus:  Managing Feelings:   The focus of this group is to identify what feelings patients have difficulty handling and develop a plan to handle them in a healthier way upon discharge.    Participation Level:  Active  Participation Quality:  Appropriate  Affect:  Appropriate  Cognitive:  Appropriate  Insight: Appropriate  Engagement in Group:  Engaged  Modes of Intervention:  Discussion  Additional Comments:  Patient participated and attended group about emotional wellness.  Rosaleen BIRCH Zayna Toste 07/20/2024, 5:22 PM

## 2024-07-20 NOTE — Progress Notes (Addendum)
 D. Pt has been visible in the milieu, observed attending groups. Pt reported sleeping well last night, described her appetite and concentration as 'good', and energy level as 'normal'. Per pt's self inventory, pt rated her depression,hopelessness and anxiety all 10's. Pt reported that she still has thoughts of drowning her son, but they have lessened. Pt currently denies SI/HI and AVH and doesn't appear to be responding to internal stimuli.  A. Labs and vitals monitored. Pt given and educated on medications. Pt supported emotionally and encouraged to express concerns and ask questions.   R. Pt remains safe with 15 minute checks. Will continue POC.    07/20/24 1000  Psych Admission Type (Psych Patients Only)  Admission Status Voluntary  Psychosocial Assessment  Patient Complaints Anxiety;Depression  Eye Contact Fair  Facial Expression Flat  Affect Appropriate to circumstance  Speech Logical/coherent  Interaction Assertive  Motor Activity Slow  Appearance/Hygiene Improved  Behavior Characteristics Cooperative  Mood Pleasant  Thought Process  Coherency WDL  Content Preoccupation  Delusions None reported or observed  Perception WDL  Hallucination None reported or observed  Judgment Impaired  Confusion Mild  Danger to Self  Current suicidal ideation? Denies  Self-Injurious Behavior No self-injurious ideation or behavior indicators observed or expressed

## 2024-07-21 DIAGNOSIS — F429 Obsessive-compulsive disorder, unspecified: Secondary | ICD-10-CM | POA: Diagnosis not present

## 2024-07-21 DIAGNOSIS — F3162 Bipolar disorder, current episode mixed, moderate: Secondary | ICD-10-CM | POA: Diagnosis not present

## 2024-07-21 MED ORDER — CLOMIPRAMINE HCL 25 MG PO CAPS
200.0000 mg | ORAL_CAPSULE | Freq: Every day | ORAL | Status: DC
  Administered 2024-07-21 – 2024-07-25 (×5): 200 mg via ORAL
  Filled 2024-07-21 (×6): qty 8

## 2024-07-21 NOTE — Group Note (Signed)
 Date:  07/21/2024 Time:  10:16 AM  Group Topic/Focus:  Goals Group:   The focus of this group is to help patients establish daily goals to achieve during treatment and discuss how the patient can incorporate goal setting into their daily lives to aide in recovery.    Participation Level:  Active  Participation Quality:  Appropriate  Affect:  Appropriate  Cognitive:  Appropriate  Insight: Appropriate  Engagement in Group:  Engaged  Modes of Intervention:  Education  Additional Comments:    Avelina DELENA Humphreys 07/21/2024, 10:16 AM

## 2024-07-21 NOTE — Group Note (Signed)
 Recreation Therapy Group Note   Group Topic:Problem Solving  Group Date: 07/21/2024 Start Time: 0945 End Time: 1016 Facilitators: Shiquita Collignon-McCall, LRT,CTRS Location: 300 Hall Dayroom   Group Topic: Communication, Team Building, Problem Solving  Goal Area(s) Addresses:  Patient will effectively work with peer towards shared goal.  Patient will identify skills used to make activity successful.  Patient will identify how skills used during activity can be used to reach post d/c goals.   Behavioral Response: Engaged  Intervention: STEM Activity  Activity: Straw Bridge. In teams of 3-5, patients were given 15 plastic drinking straws and an equal length of masking tape. Using the materials provided, patients were instructed to build a free standing bridge-like structure to suspend an everyday item (ex: puzzle box) off of the floor or table surface. All materials were required to be used by the team in their design. LRT facilitated post-activity discussion reviewing team process. Patients were encouraged to reflect how the skills used in this activity can be generalized to daily life post discharge.   Education: Pharmacist, Community, Scientist, Physiological, Discharge Planning   Education Outcome: Acknowledges education/In group clarification offered/Needs additional education.    Affect/Mood: Appropriate   Participation Level: Engaged   Participation Quality: Independent   Behavior: Appropriate   Speech/Thought Process: Focused   Insight: Good   Judgement: Good   Modes of Intervention: STEM Activity   Patient Response to Interventions:  Engaged   Education Outcome:  In group clarification offered    Clinical Observations/Individualized Feedback: Pt attended, participated and engaged with peers to complete activity.      Plan: Continue to engage patient in RT group sessions 2-3x/week.   Polly Barner-McCall, LRT,CTRS 07/21/2024 12:55 PM

## 2024-07-21 NOTE — Group Note (Signed)
 Date:  07/21/2024 Time:  1:05 PM  Group Topic/Focus:  Wellness Toolbox:   The focus of this group is to discuss various aspects of wellness, balancing those aspects and exploring ways to increase the ability to experience wellness.  Patients will create a wellness toolbox for use upon discharge.    Participation Level:  Active  Participation Quality:  Attentive  Affect:  Appropriate  Cognitive:  Alert  Insight: Good  Engagement in Group:  Engaged  Modes of Intervention:  Socialization  Additional Comments:    Kara Stephens 07/21/2024, 1:05 PM

## 2024-07-21 NOTE — Group Note (Signed)
 Date:  07/21/2024 Time:  4:36 PM  Group Topic/Focus:Number of Participants: 13  Group Focus: coping skills and other Recovery Treatment Modality:  Solution-Focused Therapy Interventions utilized were patient education and problem solving Purpose: enhance coping skills and improve communication skills   Recovery Goals:   The focus of this group is to identify appropriate goals for recovery and establish a plan to achieve them.    Participation Level:  Active  Participation Quality:  Appropriate  Affect:  Appropriate  Cognitive:  Alert  Insight: Appropriate  Engagement in Group:  Engaged  Modes of Intervention:  Discussion and Education    Kara Stephens Apolonia Ellwood 07/21/2024, 4:36 PM

## 2024-07-21 NOTE — Plan of Care (Signed)
   Problem: Education: Goal: Knowledge of [facility] Education information/materials will improve Outcome: Progressing Goal: Emotional status will improve Outcome: Progressing

## 2024-07-21 NOTE — Progress Notes (Signed)
(  Sleep Hours) - 7.25 (Any PRNs that were needed, meds refused, or side effects to meds)- None (Any disturbances and when (visitation, over night)- None (Concerns raised by the patient)- Pt reported to RN concerns of behavior exhibited by new roommate.  Pt stated she was concerned because roommate was talking aloud of wanting to beat up people.  She reported patient also stated in a argumentative tone that the bathroom was nasty because she had her belongings in there.  Pt stated she responded she was sorry but was not aware she was getting a roommate.  Concerns were shared with the roommates nurse and the new admission was allowed to sleep in the quiet. (SI/HI/AVH)- Pt denied SI and VH but reported she is still having auditory hallucinations with voices telling her to kill her baby.  Pt reported that the hallucinations are lessening.

## 2024-07-21 NOTE — Progress Notes (Signed)
   07/21/24 0800  Psych Admission Type (Psych Patients Only)  Admission Status Voluntary  Psychosocial Assessment  Patient Complaints Anxiety  Eye Contact Fair  Facial Expression Flat  Affect Appropriate to circumstance  Speech Logical/coherent  Interaction Assertive  Motor Activity Slow  Appearance/Hygiene Unremarkable  Behavior Characteristics Cooperative;Appropriate to situation  Mood Pleasant  Aggressive Behavior  Effect No apparent injury  Thought Process  Coherency WDL  Content WDL  Delusions None reported or observed  Perception WDL  Hallucination Auditory  Judgment Impaired  Confusion None  Danger to Self  Current suicidal ideation? Denies  Self-Injurious Behavior No self-injurious ideation or behavior indicators observed or expressed   Agreement Not to Harm Self Yes  Description of Agreement verbal  Danger to Others  Danger to Others Reported or observed  Danger to Others Abnormal  Description of Harmful Behavior Still having thoughts of harming her son.

## 2024-07-21 NOTE — Progress Notes (Signed)
 [facility] MD Progress Note  07/21/2024 8:12 AM Kara Stephens  MRN:  [id number]  Principal Problem: Bipolar 1 disorder, mixed, moderate (HCC) Diagnosis: Principal Problem:   Bipolar 1 disorder, mixed, moderate (HCC) Active Problems:   Obsessive compulsive disorder  Total Time spent with patient: 35 minutes   Identifying Information and Past Psychiatric History:  The patient is a 38 y.o. female (domiciled with husband and children,) with a medical history of pre-diabetes and a psychiatric history most consistent with bipolar 1 disorder, OCD and cluster B traits, currently admitted due to high anxiety related to intrusive thoughts of suicide (without actual SI).   Psychiatric history notable for prior bipolar II diagnosis with approximately 5 years of stability on lamotrigine . Symptoms reoccurred with worsening severity after the birth of her second child at age 59. Since then she has had at least 2 credible manic symptoms with psychosis. Symptoms have included racing thoughts, reduced sleep, impulsivity, pressured speech and psychomotor agitation and have included psychotic symptoms such as hallucinations. In addition to elevated mood states, the patient has had recurrent depressive symptoms characterized by low mood, anhedonia, irritability, low energy and negative intrusive thoughts. She has had 4 hospitalizations in the last 2-3 years for depression and mania: March 2025 for mania, February 2025 for mania, December 2023 bipolar mixed episode with catatonic features and OCD. She was most recently admitted from 12/09/23-12/14/23 at [facility] self-admitted largely for anxiety symptoms, feeling that her mania was not well controlled. She has no prior suicide attempts. She has been on numerous medications for management of mood symptoms as noted below with numerous side effects/intolerances or poor effects. She does present with instability in relationships, poor coping, recurrent SI, affective lability concerning  for cluster B traits confounding mood symptoms. Patient has no prior suicide attempts. Currently follows outpatient with Dr. Chyrl Na for med management and she sees Velinda Beat for therapy.   Interval events: The patient was noted to be attending groups yesterday, often not participating in discussion but listening. Did begin to speak in evening groups. Denying SI, HI and AVH, continues to voice intrusive thoughts of drowning son but with reduced intensity. Endorsing ongoing anxiety and depression. Slept 7.25 hrs overnight.  BP 114/76 (BP Location: Right Arm)   Pulse 82   Temp 98.7 F (37.1 C) (Oral)   Resp 20   Ht 5' 6 (1.676 m)   Wt 108.3 kg   SpO2 97%   BMI 38.54 kg/m    Interview today: Today the patient states she is alright. She did not get as a good a sleep last night, but attributes this to her new roommate, who was up and down throughout the night and messing with the temperature controls. She does feel a little clearer today with both the haldol  and clomipramine  being at night. She did not have any intrusive thoughts about her roommate or anyone else in the unit. She is no longer having intrusive thoughts of harming herself. At this time it is the same thought - throwing her son into the pool - which has decreased in frequency and intensity a little bit more each day. She is more hopeful now that it will continue to get better. Discussed medication changes tonight and she voiced agreement. No SI, HI or AVH reported.  Collateral: [place] Myrlene Riera [phone] - last updated 11/12 Orie Goldberg, [phone]  -last updated 11/12   Past Medical History:  Past Medical History:  Diagnosis Date   Anxiety    Back spasm  Bipolar I disorder (HCC)    Borderline personality disorder (HCC)    Class 3 obesity (HCC)    Cluster B personality disorder (HCC)    Hypercholesterolemia    Hypertension    Hypertriglyceridemia    Somatic symptom disorder    Tachycardia     Type 2 diabetes mellitus (HCC)    Vitamin D  insufficiency     Past Surgical History:  Procedure Laterality Date   APPENDECTOMY     CHOLECYSTECTOMY     HAND SURGERY     SHOULDER SURGERY     Family History:  Family History  Problem Relation Age of Onset   Hypertension Mother    Hyperlipidemia Mother    Suicidality Father    Depression Father    Social History:  Social History   Substance and Sexual Activity  Alcohol Use No     Social History   Substance and Sexual Activity  Drug Use No    Social History   Socioeconomic History   Marital status: Married    Spouse name: Not on file   Number of children: Not on file   Years of education: Not on file   Highest education level: Not on file  Occupational History   Not on file  Tobacco Use   Smoking status: Every Day    Current packs/day: 0.50    Average packs/day: 0.5 packs/day for 5.7 years (2.9 ttl pk-yrs)    Types: Cigarettes    Start date: 11/2018    Last attempt to quit: 09/10/2016   Smokeless tobacco: Never  Vaping Use   Vaping status: Never Used  Substance and Sexual Activity   Alcohol use: No   Drug use: No   Sexual activity: Not on file  Other Topics Concern   Not on file  Social History Narrative   Not on file   Social Drivers of Health   Financial Resource Strain: Not on file  Food Insecurity: Food Insecurity Present (07/04/2024)   Hunger Vital Sign    Worried About Running Out of Food in the Last Year: Sometimes true    Ran Out of Food in the Last Year: Never true  Transportation Needs: No Transportation Needs (07/04/2024)   PRAPARE - Administrator, Civil Service (Medical): No    Lack of Transportation (Non-Medical): No  Physical Activity: Not on file  Stress: Stress Concern Present (11/16/2023)   Received from [facility] - Occupational Stress Questionnaire    Feeling of Stress : Very much  Social Connections: Not on file    Current  Medications: Current Facility-Administered Medications  Medication Dose Route Frequency Provider Last Rate Last Admin   acetaminophen  (TYLENOL ) tablet 650 mg  650 mg Oral Q6H PRN McLauchlin, Angela, NP   650 mg at 07/17/24 1200   alum & mag hydroxide-simeth (MAALOX/MYLANTA) 200-200-20 MG/5ML suspension 30 mL  30 mL Oral Q4H PRN McLauchlin, Angela, NP       clomiPRAMINE  (ANAFRANIL ) capsule 200 mg  200 mg Oral QHS Towana Leita SAILOR, MD       clonazePAM  (KLONOPIN ) disintegrating tablet 0.5 mg  0.5 mg Oral QHS Towana Leita SAILOR, MD   0.5 mg at 07/20/24 2055   clonazePAM  (KLONOPIN ) tablet 0.5 mg  0.5 mg Oral BID PRN Towana Leita SAILOR, MD   0.5 mg at 07/19/24 1709   haloperidol  (HALDOL ) tablet 4 mg  4 mg Oral QHS Rosamae Rocque N, MD   4 mg at 07/20/24  2055   haloperidol  (HALDOL ) tablet 5 mg  5 mg Oral Q6H PRN Towana Leita SAILOR, MD       Or   haloperidol  lactate (HALDOL ) injection 5 mg  5 mg Intramuscular Q6H PRN Kelsey Durflinger N, MD   5 mg at 07/06/24 1508   hydrOXYzine  (ATARAX ) tablet 25 mg  25 mg Oral TID PRN Pashayan, Alexander S, DO   25 mg at 07/19/24 9862   lamoTRIgine  (LAMICTAL ) tablet 250 mg  250 mg Oral Daily Towana Leita SAILOR, MD   250 mg at 07/20/24 9185   magnesium  hydroxide (MILK OF MAGNESIA) suspension 30 mL  30 mL Oral Daily PRN McLauchlin, Angela, NP       melatonin tablet 3 mg  3 mg Oral QHS [place] Espina N, MD   3 mg at 07/20/24 2055   memantine  (NAMENDA ) tablet 10 mg  10 mg Oral BID Raffaella Edison N, MD   10 mg at 07/20/24 1706   metFORMIN  (GLUCOPHAGE ) tablet 500 mg  500 mg Oral Q breakfast Towana Leita SAILOR, MD   500 mg at 07/20/24 0813   nystatin (MYCOSTATIN/NYSTOP) topical powder   Topical TID PRN Towana Leita SAILOR, MD   1 Application at 07/20/24 1617   nystatin cream (MYCOSTATIN)   Topical BID Towana Leita SAILOR, MD       propranolol  ER (INDERAL  LA) 24 hr capsule 60 mg  60 mg Oral Daily Arkie Tagliaferro N, MD   60 mg at 07/20/24 0813   QUEtiapine  (SEROQUEL  XR) 24 hr tablet 300 mg  300 mg Oral QHS  Malyssa Maris N, MD   300 mg at 07/20/24 2054   traZODone  (DESYREL ) tablet 50 mg  50 mg Oral QHS Elloise Roark N, MD   50 mg at 07/20/24 2055    Lab Results:  No results found for this or any previous visit (from the past 48 hours).   Blood Alcohol level:  Lab Results  Component Value Date   [facility] <15 07/04/2024   ETH <10 08/10/2022    Metabolic Disorder Labs: Lab Results  Component Value Date   HGBA1C 5.4 07/04/2024   MPG 108.28 07/04/2024   MPG 119.76 12/07/2023   No results found for: PROLACTIN Lab Results  Component Value Date   CHOL 223 (H) 07/04/2024   TRIG 100 07/04/2024   HDL 53 07/04/2024   CHOLHDL 4.2 07/04/2024   VLDL 20 07/04/2024   LDLCALC 150 (H) 07/04/2024   LDLCALC 92 12/07/2023    Physical Findings:  Mental Status exam: Appearance: white female of slightly elevated BMI, appropriately groomed in casual clothing, seen sitting in the day room with peers Eye contact: good  Attitude towards examiner: cooperative, pleasant  Psychomotor: no agitation or retardation  Speech: normal in prosody; slow and meandering Language: no delays  Mood: okay   Affect: congruent, fatigued but increasingly more reactive  Thought content: denying SI and HI (reporting intrusive thoughts of harming self and son but no desire or intent to act on thoughts), no delusions expressed  Thought Process: linear, organized and goal-directed  Perception: denying AVH, not RTIS  Insight: fair  Judgement: fair    Orientation: x3 Attention/Concentration: good - attends to interview  Memory/Cognition: grossly intact   Fund of Knowledge: Average      Musculoskeletal: Strength & Muscle Tone: within normal limits Gait & Station: normal Patient leans: N/A    Physical Exam Constitutional:      Appearance: Normal appearance.  HENT:     Head: Normocephalic  and atraumatic.  Pulmonary:     Effort: Pulmonary effort is normal.  Abdominal:     General: There is no distension.   Musculoskeletal:        General: Normal range of motion.     Cervical back: Normal range of motion.  Neurological:     General: No focal deficit present.     Mental Status: She is alert.    Review of Systems  All other systems reviewed and are negative.  Blood pressure 114/76, pulse 82, temperature 98.7 F (37.1 C), temperature source Oral, resp. rate 20, height 5' 6 (1.676 m), weight 108.3 kg, SpO2 97%. Body mass index is 38.54 kg/m.   Treatment Plan Summary: Daily contact with patient to assess and evaluate symptoms and progress in treatment  Assessment: The patient is a 38 y.o. female with a psychiatric history most consistent with bipolar 1 disorder, current episode mixed and cluster B traits. She had carried a bipolar II diagnosis through much of adult life with worsening of symptoms occurring post-partum at age 14. Since then she has had 4 psychiatric admissions, 2 for credible manic episodes with psychosis. She has additionally presented with recurrent depressive episodes, although no suicide attempts. Mood symptoms are confounded by cluster B traits with clear history of unstable relationships, affective lability, poor coping, recurrent SI. On this admission the patient presents largely with concerns related to high anxiety and intrusive thoughts. Although reporting concerns for mania, aside from mildly reduced sleep (5 hrs) and rapidly cycling thoughts she does not present with endorsed active manic symptoms and does not present with observable signs of mania.  Given this admission was precipitated by several recent psychosocial stressors, personality pathology may be contributing to worsening of symptoms  to some degree. Regardless, she has endorsed significant distress with current intrusive thoughts of harm to self/others, overall high anxiety and urges to count to reduce this more consistent with an OCD picture.     The patient has had minimal improvements in subjective  intrusive thoughts (sometimes described as hallucinations although with no additional objective evidence of psychosis) despite ongoign medication changes, including addition of haldol  2 mg BID. She did not possible decrease in frequency and was documented to attend a group with better engagement yesterday. Clomipramine  was increased to 75 mg BID as of 11/10. Over the past 2-3 days the patient has reported intrusive thoughts as less frequent and less severe. Yesterday continued to voice subjective poor sleep and emotional numbing so haldol  was consolidated to evening time. She was move to regular floor to participate in groups and engage more with peers.   On 11/13, the patient was voicing improvements in mood and slightly brighter in affect, although continued to note emotional blunting. Intrusive thoughts less frequent and less severe. Increased evening clomipramine  to 100 mg 11/13 with plan to consolidate dose to 200 mg tonight to address daytime fatigue/blunting and to further improve sleep.. She did report feeling less blunted today and more energetic, although other symptoms had not significantly changed. Will continue with plan to increase clomipramine  to 200 mg tonight and monitor for response.      DSM-5 diagnoses: Bipolar I disorder, current episode mixed, no current psychotic features  Obsessive-Compulsive disorder  Cluster B traits              -r/o BPD   Plan:   Legal Status: -voluntary    Safety -q15 minute checks  -elopement, suicide and assault precautions  -daily vitals   Psychiatric Concerns  -  Continue Lamotrigine  250 mg daily for mood  -Continue Seroquel  XR to 300 mg at bedtime for sleep and mood stabilization  -Continue Namenda  10 mg BID for reported intrusive thoughts (patient takes at home and reports benefit) -Continue Trazodone  50 mg at bedtime for sleep -Continue haldol  4 mg at bedtime for psychosis/mood stability -Increase Clomipramine  to 200 mg tonight for  intrusive thoughts related to OCD -Continue Klonopin  0.5 mg BID PRN for anxiety + scheduled 0.5 mg at bedtime for sleep   -patient notified klonopin  will not be continued after hospital and to reserve for severe anxiety only -Continue Propranolol  ER 60 mg daily  -Continue PRN haldol  5 mg PO or IM q6h for agitation    Substance use concerns  -none currently    Nicotine  Replacement  N/A    Medical concerns Pre-diabetes -Current A1c WNL (5.4) but was elevated 7 months ago prior to beginning metformin  -Continue metformin  500 mg daily      Additional PRNs: -Tylenol  tablets 650 mg every 6 hours as needed for pain -Maalox/Mylanta suspension 30 mL every 4 hours as needed for indigestion  -Milk of Magnesia 30 mL daily as needed for constipation   Labs -Reviewed as documented in HPI   Psychosocial interventions  -daily medication management with psychiatry -Medication education regarding risks/benefits and alternatives -bedside psychotherapy as indicated  -Patient will be encouraged to participate and engage with group therapy  -Appreciate SW assistance in coordinating safe disposition    Leita LOISE Arts, MD 07/21/2024, 8:12 AM

## 2024-07-21 NOTE — Progress Notes (Signed)
 Pt visible in the milieu.  Interacting appropriately with staff and peers.  Pt reported her day was better than the rest of her days because the AH telling her to kill her son are getting less frequent.  Pt rated anxiety 5/10 and depression 0/10.  Pt denied SI, VH.  Fifteen minute checks continue for patient safety.  Pt safe on unit.

## 2024-07-21 NOTE — [facility] Group Notes (Signed)
 Adult Psychoeducational Group Note  Date:  07/21/2024 Time:  9:05 PM  Group Topic/Focus:  Wrap-Up Group:   The focus of this group is to help patients review their daily goal of treatment and discuss progress on daily workbooks.  Participation Level:  Active  Participation Quality:  Appropriate  Affect:  Appropriate  Cognitive:  Appropriate  Insight: Appropriate  Engagement in Group:  Engaged  Modes of Intervention:  Discussion  Additional Comments:  Kindall said her day was a 5. Goal have a better day talk to the doctor Favorite part of the day seeing her husbandcoping skills praying & talking to family.    Lang Drilling Long 07/21/2024, 9:05 PM

## 2024-07-22 DIAGNOSIS — F429 Obsessive-compulsive disorder, unspecified: Secondary | ICD-10-CM | POA: Diagnosis not present

## 2024-07-22 DIAGNOSIS — F3162 Bipolar disorder, current episode mixed, moderate: Secondary | ICD-10-CM | POA: Diagnosis not present

## 2024-07-22 NOTE — Plan of Care (Addendum)
 D:  Patient's self inventory sheet, patient has fair sleep, no sleep medicine.  Good appetite, low energy level, good concentration.  Rated depression 8, hopeless and anxiety 10.  Denied withdrawals.  SI, no thoughts of drowning my 38 year old son.  Denied physical problems.  Denied physical pain.  Goal is to be positive and move forward, get better.  Does have discharge plans.  Does want to discharge and live with his mom. A:  Medications administrated  per MD orders.  Emotional support and encouragement given patient. R:  Denied SI and HI, contracts for safety.  Denied A/V hallucinations.  Safety maintained with 15 minute checks.

## 2024-07-22 NOTE — Progress Notes (Signed)
   07/22/24 2045  Psych Admission Type (Psych Patients Only)  Admission Status Voluntary  Psychosocial Assessment  Patient Complaints Depression  Eye Contact Fair  Facial Expression Flat  Affect Appropriate to circumstance  Speech Logical/coherent  Interaction Assertive  Motor Activity Slow  Appearance/Hygiene Unremarkable  Behavior Characteristics Cooperative  Mood Pleasant  Thought Process  Coherency WDL  Content WDL  Delusions None reported or observed  Perception WDL  Hallucination None reported or observed  Judgment Impaired  Confusion None  Danger to Self  Current suicidal ideation? Denies

## 2024-07-22 NOTE — Progress Notes (Signed)
 Patient's afternoon BP 126/78  P 86.

## 2024-07-22 NOTE — Group Note (Signed)
 Date:  07/22/2024 Time:  10:51 AM  Group Topic/Focus: Social work To help members understand different types of therapy, compare their benefits, and choose approaches that best meet their mental health needs.      Participation Level:  Active   Additional Comments:  Patient attend  Kara Stephens 07/22/2024, 10:51 AM

## 2024-07-22 NOTE — Progress Notes (Signed)
 [facility] MD Progress Note  07/22/2024 7:45 AM Kara Stephens  MRN:  [id number]  Principal Problem: Bipolar 1 disorder, mixed, moderate (HCC) Diagnosis: Principal Problem:   Bipolar 1 disorder, mixed, moderate (HCC) Active Problems:   Obsessive compulsive disorder  Total Time spent with patient: 35 minutes   Identifying Information and Past Psychiatric History:  The patient is a 38 y.o. female (domiciled with husband and children,) with a medical history of pre-diabetes and a psychiatric history most consistent with bipolar 1 disorder, OCD and cluster B traits, currently admitted due to high anxiety related to intrusive thoughts of suicide (without actual SI).   Psychiatric history notable for prior bipolar II diagnosis with approximately 5 years of stability on lamotrigine . Symptoms reoccurred with worsening severity after the birth of her second child at age 34. Since then she has had at least 2 credible manic symptoms with psychosis. Symptoms have included racing thoughts, reduced sleep, impulsivity, pressured speech and psychomotor agitation and have included psychotic symptoms such as hallucinations. In addition to elevated mood states, the patient has had recurrent depressive symptoms characterized by low mood, anhedonia, irritability, low energy and negative intrusive thoughts. She has had 4 hospitalizations in the last 2-3 years for depression and mania: March 2025 for mania, February 2025 for mania, December 2023 bipolar mixed episode with catatonic features and OCD. She was most recently admitted from 12/09/23-12/14/23 at [facility] self-admitted largely for anxiety symptoms, feeling that her mania was not well controlled. She has no prior suicide attempts. She has been on numerous medications for management of mood symptoms as noted below with numerous side effects/intolerances or poor effects. She does present with instability in relationships, poor coping, recurrent SI, affective lability concerning  for cluster B traits confounding mood symptoms. Patient has no prior suicide attempts. Currently follows outpatient with Dr. Chyrl Na for med management and she sees Velinda Beat for therapy.   Interval events: The patient was documented to be attending groups with good engagement. Slow in motor activity and flattened affect. Denying SI/HI/AVH but continuing to report intrusive thoughts regarding harming son. Medication compliant and no behavioral concerns.  BP 117/73 (BP Location: Left Arm)   Pulse 81   Temp 98.7 F (37.1 C) (Oral)   Resp 14   Ht 5' 6 (1.676 m)   Wt 108.3 kg   SpO2 97%   BMI 38.54 kg/m    Interview today: Today the patient states she is pretty good. She slept better last night with her current roommate and with the evening clomipramine . States that she does feel a bit groggy this morning, but yesterday her energy recovered well by the afternoon and overall has been feeling better. Every day she has noticed decreases in the amount of and intensity of the intrusive thoughts. She has had no recurrence of thoughts towards others in the hospital or her roommate. Overall feels that things are going in the right direction. Still some concerns regarding the haldol , but wanting to continued unchanged for this evening. No additional questions or concerns today. Denying frank SI, HI or AVH. Intrusive thoughts are confined to the same one about throwing her son in the pool.   Collateral: Wilburt Latrice Storlie [phone] - last updated 11/12 Orie Goldberg, [phone]  -last updated 11/12   Past Medical History:  Past Medical History:  Diagnosis Date   Anxiety    Back spasm    Bipolar I disorder (HCC)    Borderline personality disorder (HCC)    Class 3 obesity (  HCC)    Cluster B personality disorder (HCC)    Hypercholesterolemia    Hypertension    Hypertriglyceridemia    Somatic symptom disorder    Tachycardia    Type 2 diabetes mellitus (HCC)    Vitamin D   insufficiency     Past Surgical History:  Procedure Laterality Date   APPENDECTOMY     CHOLECYSTECTOMY     HAND SURGERY     SHOULDER SURGERY     Family History:  Family History  Problem Relation Age of Onset   Hypertension Mother    Hyperlipidemia Mother    Suicidality Father    Depression Father    Social History:  Social History   Substance and Sexual Activity  Alcohol Use No     Social History   Substance and Sexual Activity  Drug Use No    Social History   Socioeconomic History   Marital status: Married    Spouse name: Not on file   Number of children: Not on file   Years of education: Not on file   Highest education level: Not on file  Occupational History   Not on file  Tobacco Use   Smoking status: Every Day    Current packs/day: 0.50    Average packs/day: 0.5 packs/day for 5.7 years (2.9 ttl pk-yrs)    Types: Cigarettes    Start date: 11/2018    Last attempt to quit: 09/10/2016   Smokeless tobacco: Never  Vaping Use   Vaping status: Never Used  Substance and Sexual Activity   Alcohol use: No   Drug use: No   Sexual activity: Not on file  Other Topics Concern   Not on file  Social History Narrative   Not on file   Social Drivers of Health   Financial Resource Strain: Not on file  Food Insecurity: Food Insecurity Present (07/04/2024)   Hunger Vital Sign    Worried About Running Out of Food in the Last Year: Sometimes true    Ran Out of Food in the Last Year: Never true  Transportation Needs: No Transportation Needs (07/04/2024)   PRAPARE - Administrator, Civil Service (Medical): No    Lack of Transportation (Non-Medical): No  Physical Activity: Not on file  Stress: Stress Concern Present (11/16/2023)   Received from [facility] - Occupational Stress Questionnaire    Feeling of Stress : Very much  Social Connections: Not on file    Current Medications: Current Facility-Administered  Medications  Medication Dose Route Frequency Provider Last Rate Last Admin   acetaminophen  (TYLENOL ) tablet 650 mg  650 mg Oral Q6H PRN McLauchlin, Angela, NP   650 mg at 07/17/24 1200   alum & mag hydroxide-simeth (MAALOX/MYLANTA) 200-200-20 MG/5ML suspension 30 mL  30 mL Oral Q4H PRN McLauchlin, Angela, NP       clomiPRAMINE  (ANAFRANIL ) capsule 200 mg  200 mg Oral QHS Towana Leita SAILOR, MD   200 mg at 07/21/24 2113   clonazePAM  (KLONOPIN ) disintegrating tablet 0.5 mg  0.5 mg Oral QHS Towana Leita SAILOR, MD   0.5 mg at 07/21/24 2112   clonazePAM  (KLONOPIN ) tablet 0.5 mg  0.5 mg Oral BID PRN Towana Leita SAILOR, MD   0.5 mg at 07/21/24 1403   haloperidol  (HALDOL ) tablet 4 mg  4 mg Oral QHS Towana Leita SAILOR, MD   4 mg at 07/21/24 2112   haloperidol  (HALDOL ) tablet 5 mg  5 mg Oral Q6H PRN  Towana Leita SAILOR, MD       Or   haloperidol  lactate (HALDOL ) injection 5 mg  5 mg Intramuscular Q6H PRN Ildefonso Keaney N, MD   5 mg at 07/06/24 1508   hydrOXYzine  (ATARAX ) tablet 25 mg  25 mg Oral TID PRN Pashayan, Alexander S, DO   25 mg at 07/19/24 9862   lamoTRIgine  (LAMICTAL ) tablet 250 mg  250 mg Oral Daily Towana Leita SAILOR, MD   250 mg at 07/22/24 9263   magnesium  hydroxide (MILK OF MAGNESIA) suspension 30 mL  30 mL Oral Daily PRN McLauchlin, Angela, NP       melatonin tablet 3 mg  3 mg Oral QHS Atalia Litzinger N, MD   3 mg at 07/21/24 2112   memantine  (NAMENDA ) tablet 10 mg  10 mg Oral BID Tylene Quashie N, MD   10 mg at 07/22/24 9261   metFORMIN  (GLUCOPHAGE ) tablet 500 mg  500 mg Oral Q breakfast Towana Leita SAILOR, MD   500 mg at 07/22/24 0737   nystatin (MYCOSTATIN/NYSTOP) topical powder   Topical TID PRN Towana Leita SAILOR, MD   Given at 07/21/24 0820   nystatin cream (MYCOSTATIN)   Topical BID Towana Leita SAILOR, MD   1 Application at 07/22/24 0739   propranolol  ER (INDERAL  LA) 24 hr capsule 60 mg  60 mg Oral Daily Yona Kosek N, MD   60 mg at 07/22/24 9261   QUEtiapine  (SEROQUEL  XR) 24 hr tablet 300 mg  300 mg Oral QHS  Dezmond Downie N, MD   300 mg at 07/21/24 2112   traZODone  (DESYREL ) tablet 50 mg  50 mg Oral QHS Natacha Jepsen N, MD   50 mg at 07/21/24 2112    Lab Results:  No results found for this or any previous visit (from the past 48 hours).   Blood Alcohol level:  Lab Results  Component Value Date   [facility] <15 07/04/2024   ETH <10 08/10/2022    Metabolic Disorder Labs: Lab Results  Component Value Date   HGBA1C 5.4 07/04/2024   MPG 108.28 07/04/2024   MPG 119.76 12/07/2023   No results found for: PROLACTIN Lab Results  Component Value Date   CHOL 223 (H) 07/04/2024   TRIG 100 07/04/2024   HDL 53 07/04/2024   CHOLHDL 4.2 07/04/2024   VLDL 20 07/04/2024   LDLCALC 150 (H) 07/04/2024   LDLCALC 92 12/07/2023    Physical Findings:  Mental Status exam: Appearance: white female of slightly elevated BMI, appropriately groomed in galaxy tights and t-shirt, seen sitting in the day room with peers Eye contact: good  Attitude towards examiner: cooperative, pleasant  Psychomotor: no agitation or retardation  Speech: normal in prosody; slow and meandering Language: no delays  Mood: pretty good   Affect: congruent, remains with blunting but slightly more reactivity today Thought content: denying SI and HI (reporting intrusive thoughts of harming son but no desire or intent to act on thoughts), no delusions expressed  Thought Process: linear, organized and goal-directed  Perception: denying AVH, not RTIS  Insight: fair  Judgement: fair    Orientation: x3 Attention/Concentration: good - attends to interview  Memory/Cognition: grossly intact   Fund of Knowledge: Average      Musculoskeletal: Strength & Muscle Tone: within normal limits Gait & Station: normal Patient leans: N/A    Physical Exam Constitutional:      Appearance: Normal appearance.  HENT:     Head: Normocephalic and atraumatic.  Pulmonary:     Effort:  Pulmonary effort is normal.  Abdominal:     General: There  is no distension.  Musculoskeletal:        General: Normal range of motion.     Cervical back: Normal range of motion.  Neurological:     General: No focal deficit present.     Mental Status: She is alert.    Review of Systems  All other systems reviewed and are negative.  Blood pressure 117/73, pulse 81, temperature 98.7 F (37.1 C), temperature source Oral, resp. rate 14, height 5' 6 (1.676 m), weight 108.3 kg, SpO2 97%. Body mass index is 38.54 kg/m.   Treatment Plan Summary: Daily contact with patient to assess and evaluate symptoms and progress in treatment  Assessment: The patient is a 38 y.o. female with a psychiatric history most consistent with bipolar 1 disorder, current episode mixed and cluster B traits. She had carried a bipolar II diagnosis through much of adult life with worsening of symptoms occurring post-partum at age 58. Since then she has had 4 psychiatric admissions, 2 for credible manic episodes with psychosis. She has additionally presented with recurrent depressive episodes, although no suicide attempts. Mood symptoms are confounded by cluster B traits with clear history of unstable relationships, affective lability, poor coping, recurrent SI. On this admission the patient presents largely with concerns related to high anxiety and intrusive thoughts. Although reporting concerns for mania, aside from mildly reduced sleep (5 hrs) and rapidly cycling thoughts she does not present with endorsed active manic symptoms and does not present with observable signs of mania.  Given this admission was precipitated by several recent psychosocial stressors, personality pathology may be contributing to worsening of symptoms  to some degree. Regardless, she has endorsed significant distress with current intrusive thoughts of harm to self/others, overall high anxiety and urges to count to reduce this more consistent with an OCD picture.     The patient has had minimal improvements in  subjective intrusive thoughts (sometimes described as hallucinations although with no additional objective evidence of psychosis) despite ongoign medication changes, including addition of haldol  2 mg BID. She did not possible decrease in frequency and was documented to attend a group with better engagement yesterday. Clomipramine  was increased to 75 mg BID as of 11/10. Over the past 2-3 days the patient has reported intrusive thoughts as less frequent and less severe. Yesterday continued to voice subjective poor sleep and emotional numbing so haldol  was consolidated to evening time. She was move to regular floor to participate in groups and engage more with peers.   On 11/13, the patient was voicing improvements in mood and slightly brighter in affect, although continued to note emotional blunting. Intrusive thoughts less frequent and less severe. Increased evening clomipramine  to 100 mg 11/13 and then consolidated dose to 200 mg last evening (11/14) to address daytime fatigue/blunting and to further improve sleep. Today the patient endorsed better sleep overnight and somewhat improved energy during the day. Every day incremental decreases in the intrusive thoughts. Better able to engage in groups at this time. Will continue current medications tonight and consider taper of haldol  tomorrow night.   DSM-5 diagnoses: Bipolar I disorder, current episode mixed, no current psychotic features  Obsessive-Compulsive disorder  Cluster B traits              -r/o BPD   Plan:   Legal Status: -voluntary    Safety -q15 minute checks  -elopement, suicide and assault precautions  -daily vitals   Psychiatric Concerns  -  Continue Lamotrigine  250 mg daily for mood  -Continue Seroquel  XR to 300 mg at bedtime for sleep and mood stabilization  -Continue Namenda  10 mg BID for reported intrusive thoughts (patient takes at home and reports benefit) -Continue Trazodone  50 mg at bedtime for sleep -Continue haldol  4  mg at bedtime for psychosis/mood stability -Increase Clomipramine  to 200 mg tonight for intrusive thoughts related to OCD -Continue Klonopin  0.5 mg BID PRN for anxiety + scheduled 0.5 mg at bedtime for sleep   -patient notified klonopin  will not be continued after hospital and to reserve for severe anxiety only -Continue Propranolol  ER 60 mg daily  -Continue PRN haldol  5 mg PO or IM q6h for agitation    Substance use concerns  -none currently    Nicotine  Replacement  N/A    Medical concerns Pre-diabetes -Current A1c WNL (5.4) but was elevated 7 months ago prior to beginning metformin  -Continue metformin  500 mg daily      Additional PRNs: -Tylenol  tablets 650 mg every 6 hours as needed for pain -Maalox/Mylanta suspension 30 mL every 4 hours as needed for indigestion  -Milk of Magnesia 30 mL daily as needed for constipation   Labs -Reviewed as documented in HPI   Psychosocial interventions  -daily medication management with psychiatry -Medication education regarding risks/benefits and alternatives -bedside psychotherapy as indicated  -Patient will be encouraged to participate and engage with group therapy  -Appreciate SW assistance in coordinating safe disposition    Leita LOISE Arts, MD 07/22/2024, 7:45 AM

## 2024-07-22 NOTE — Group Note (Signed)
 [facility] LCSW Group Therapy Note    Group Date: 07/22/2024 Start Time: 1000 End Time: 1100  Type of Therapy and Topic:  Group Therapy:  Overcoming Obstacles  Participation Level:  [facility] PARTICIPATION LEVEL: Active  Mood:  Description of Group:   In this group patients will be encouraged to explore what they see as obstacles to their own wellness and recovery. They will be guided to discuss their thoughts, feelings, and behaviors related to these obstacles. The group will process together ways to cope with barriers, with attention given to specific choices patients can make. Each patient will be challenged to identify changes they are motivated to make in order to overcome their obstacles. This group will be process-oriented, with patients participating in exploration of their own experiences as well as giving and receiving support and challenge from other group members.  Therapeutic Goals: 1. Patient will identify personal and current obstacles as they relate to admission. 2. Patient will identify barriers that currently interfere with their wellness or overcoming obstacles.  3. Patient will identify feelings, thought process and behaviors related to these barriers. 4. Patient will identify two changes they are willing to make to overcome these obstacles:    Summary of Patient Progress   The patient quiet and sat in the corner next to the phone. Did not engage in topic.   Therapeutic Modalities:   Cognitive Behavioral Therapy Solution Focused Therapy Motivational Interviewing Relapse Prevention Therapy   Adolfo Granieri O Clarece Drzewiecki, LCSWA

## 2024-07-22 NOTE — Group Note (Signed)
 Date:  07/22/2024 Time:  10:32 AM  Group Topic/Focus: Social wellness and orientation goals Group Goals Group:   The focus of this group is to help patients establish daily goals to achieve during treatment and discuss how the patient can incorporate goal setting into their daily lives to aide in recovery. Orientation:   The focus of this group is to educate the patient on the purpose and policies of crisis stabilization and provide a format to answer questions about their admission.  The group details unit policies and expectations of patients while admitted.    Participation Level:  Active  Participation Quality:  Appropriate  Affect:  Appropriate  Cognitive:  Alert  Insight: Appropriate  Engagement in Group:  Engaged and Improving  Modes of Intervention:  Discussion and Orientation  Additional Comments:  Patient attended  Kara Stephens 07/22/2024, 10:32 AM

## 2024-07-22 NOTE — Group Note (Signed)
 Date:  07/22/2024 Time:  4:11 PM  Group Topic/Focus:  Overcoming Stress:   The focus of this group is to define stress and help patients assess their triggers.    Participation Level:  Active  Participation Quality:  Appropriate  Affect:  Appropriate  Cognitive:  Appropriate  Insight: Appropriate  Engagement in Group:  Engaged  Modes of Intervention:  Discussion   Kara Stephens  Kara Stephens 07/22/2024, 4:11 PM

## 2024-07-22 NOTE — Progress Notes (Signed)
(  Sleep Hours) - 7.25 (Any PRNs that were needed, meds refused, or side effects to meds)- No PRN meds given, no meds refused.  (Any disturbances and when (visitation, over night)- None  (Concerns raised by the patient)- None  (SI/HI/AVH)- Denies SI/HI/AVH

## 2024-07-22 NOTE — Plan of Care (Signed)
 Nurse discussed anxiety, depression and coping skills with patient.

## 2024-07-22 NOTE — Plan of Care (Signed)
   Problem: Education: Goal: Emotional status will improve Outcome: Progressing Goal: Mental status will improve Outcome: Progressing Goal: Verbalization of understanding the information provided will improve Outcome: Progressing   Problem: Activity: Goal: Interest or engagement in activities will improve Outcome: Progressing

## 2024-07-22 NOTE — Group Note (Signed)
 Date:  07/22/2024 Time:  8:52 PM  Group Topic/Focus:  Wrap-Up Group:   The focus of this group is to help patients review their daily goal of treatment and discuss progress on daily workbooks.    Participation Level:  Active  Participation Quality:  Appropriate and Sharing  Affect:  Appropriate  Cognitive:  Appropriate  Insight: Appropriate  Engagement in Group:  Engaged  Modes of Intervention:  Activity and Socialization  Additional Comments:  Patient activitly enaged in wrap up group and participated in the group activity.  [name] 07/22/2024, 8:52 PM

## 2024-07-23 DIAGNOSIS — F3162 Bipolar disorder, current episode mixed, moderate: Secondary | ICD-10-CM | POA: Diagnosis not present

## 2024-07-23 DIAGNOSIS — F429 Obsessive-compulsive disorder, unspecified: Secondary | ICD-10-CM | POA: Diagnosis not present

## 2024-07-23 NOTE — Plan of Care (Signed)
 Nurse discussed anxiety, depression and coping skills with patient.

## 2024-07-23 NOTE — Plan of Care (Addendum)
 D:  Patient's self inventory sheet, patient sleeps good,   No sleep medicine given.   Good appetite, normal energy level, good concentration.  Rated depression, anxiety and hopeless #8.  Denied withdrawals.  Denied SI.  Denied physical problems.  Denied physical pain. HI, same though about my son, but they are less frequent.   Goal is have good day.  Plans to stay positive.  Does have discharge plans. A:  Medications administered per MD orders.  Emotional support and encouragement given patient. R:  Denied SI and HI, contracts for safety.  Denied A/V hallucinations.  Safety maintained with 15 minute checks.

## 2024-07-23 NOTE — Group Note (Signed)
 Date:  07/23/2024 Time:  8:57 PM  Group Topic/Focus:  Wrap-Up Group:   The focus of this group is to help patients review their daily goal of treatment and discuss progress on daily workbooks.    Participation Level:  Active  Participation Quality:  Appropriate and Sharing  Affect:  Appropriate  Cognitive:  Appropriate  Insight: Appropriate  Engagement in Group:  Engaged  Modes of Intervention:  Activity and Socialization  Additional Comments:  Patient actively engaged and shared during wrap up group and participated in group activity.   [name] 07/23/2024, 8:57 PM

## 2024-07-23 NOTE — Group Note (Signed)
 Date:  07/23/2024 Time:  9:04 AM  Group Topic/Focus:  Goals Group:   The focus of this group is to help patients establish daily goals to achieve during treatment and discuss how the patient can incorporate goal setting into their daily lives to aide in recovery.    Participation Level:  Active  Participation Quality:  Attentive  Affect:  Appropriate  Cognitive:  Appropriate  Insight: Appropriate  Engagement in Group:  Improving  Modes of Intervention:  Discussion  Additional Comments:  Pt stated goal for today was to have a good day. Pt states to do this she will stay positive.  Elissia Spiewak A Sande Pickert 07/23/2024, 9:04 AM

## 2024-07-23 NOTE — Group Note (Signed)
 Date:  07/23/2024 Time:  6:33 PM  Group Topic/Focus:  Group used a non- competitive card game to build mindful listening skills, and encourage acceptance and understanding of oneself and peers. Patients cooperate and share, fostering empathy and personal growth in a supportive environment.     Participation Level:  Active  Participation Quality:  Appropriate and Attentive  Affect:  Flat  Cognitive:  Alert and Appropriate  Insight: Appropriate and Good  Engagement in Group:  Engaged  Modes of Intervention:  Activity, Discussion, Exploration, Rapport Building, Socialization, and Support    Berwyn GORMAN Acosta 07/23/2024, 6:33 PM

## 2024-07-23 NOTE — Progress Notes (Signed)
 [facility] MD Progress Note  07/23/2024 8:21 AM YESICA KEMLER  MRN:  [id number]  Principal Problem: Bipolar 1 disorder, mixed, moderate (HCC) Diagnosis: Principal Problem:   Bipolar 1 disorder, mixed, moderate (HCC) Active Problems:   Obsessive compulsive disorder  Total Time spent with patient: 35 minutes   Identifying Information and Past Psychiatric History:  The patient is a 38 y.o. female (domiciled with husband and children,) with a medical history of pre-diabetes and a psychiatric history most consistent with bipolar 1 disorder, OCD and cluster B traits, currently admitted due to high anxiety related to intrusive thoughts of suicide (without actual SI).   Psychiatric history notable for prior bipolar II diagnosis with approximately 5 years of stability on lamotrigine . Symptoms reoccurred with worsening severity after the birth of her second child at age 77. Since then she has had at least 2 credible manic symptoms with psychosis. Symptoms have included racing thoughts, reduced sleep, impulsivity, pressured speech and psychomotor agitation and have included psychotic symptoms such as hallucinations. In addition to elevated mood states, the patient has had recurrent depressive symptoms characterized by low mood, anhedonia, irritability, low energy and negative intrusive thoughts. She has had 4 hospitalizations in the last 2-3 years for depression and mania: March 2025 for mania, February 2025 for mania, December 2023 bipolar mixed episode with catatonic features and OCD. She was most recently admitted from 12/09/23-12/14/23 at [facility] self-admitted largely for anxiety symptoms, feeling that her mania was not well controlled. She has no prior suicide attempts. She has been on numerous medications for management of mood symptoms as noted below with numerous side effects/intolerances or poor effects. She does present with instability in relationships, poor coping, recurrent SI, affective lability concerning  for cluster B traits confounding mood symptoms. Patient has no prior suicide attempts. Currently follows outpatient with Dr. Chyrl Na for med management and she sees Velinda Beat for therapy.   Interval events: The patient was documented to be attending groups with appropriate engagement, continuing to report the intrusive thoughts regarding her son however denying SI, hI or AVH. Slept 7.5 hrs. No behavioral PRNs required. BP 121/76 (BP Location: Left Arm)   Pulse 80   Temp 98.4 F (36.9 C) (Oral)   Resp (!) 22   Ht 5' 6 (1.676 m)   Wt 108.3 kg   SpO2 97%   BMI 38.54 kg/m    Interview today: Today the patient states she is good. She slept very well last night, the best she has since she got here. Did not wake through the night. She does feel a bit groggy this morning, however notes that her energy levels typically pick back up through the afternoon. She is reporting reduced frequency of intrusive thought of throwing her son in the pool (15 times an hr rather than 20 times an hour) and overall feeling more herself than she has in some time. Does want to continue with her medications exactly as they are for now as she is finally beginning to feel better. No SI, HI or AVH today. No additional questions or concerns.   Collateral: Wilburt Poonam Woehrle [phone] - last updated 11/12 Orie Goldberg, [phone]  -last updated 11/12   Past Medical History:  Past Medical History:  Diagnosis Date   Anxiety    Back spasm    Bipolar I disorder (HCC)    Borderline personality disorder (HCC)    Class 3 obesity (HCC)    Cluster B personality disorder (HCC)    Hypercholesterolemia  Hypertension    Hypertriglyceridemia    Somatic symptom disorder    Tachycardia    Type 2 diabetes mellitus (HCC)    Vitamin D  insufficiency     Past Surgical History:  Procedure Laterality Date   APPENDECTOMY     CHOLECYSTECTOMY     HAND SURGERY     SHOULDER SURGERY     Family History:   Family History  Problem Relation Age of Onset   Hypertension Mother    Hyperlipidemia Mother    Suicidality Father    Depression Father    Social History:  Social History   Substance and Sexual Activity  Alcohol Use No     Social History   Substance and Sexual Activity  Drug Use No    Social History   Socioeconomic History   Marital status: Married    Spouse name: Not on file   Number of children: Not on file   Years of education: Not on file   Highest education level: Not on file  Occupational History   Not on file  Tobacco Use   Smoking status: Every Day    Current packs/day: 0.50    Average packs/day: 0.5 packs/day for 5.7 years (2.9 ttl pk-yrs)    Types: Cigarettes    Start date: 11/2018    Last attempt to quit: 09/10/2016   Smokeless tobacco: Never  Vaping Use   Vaping status: Never Used  Substance and Sexual Activity   Alcohol use: No   Drug use: No   Sexual activity: Not on file  Other Topics Concern   Not on file  Social History Narrative   Not on file   Social Drivers of Health   Financial Resource Strain: Not on file  Food Insecurity: Food Insecurity Present (07/04/2024)   Hunger Vital Sign    Worried About Running Out of Food in the Last Year: Sometimes true    Ran Out of Food in the Last Year: Never true  Transportation Needs: No Transportation Needs (07/04/2024)   PRAPARE - Administrator, Civil Service (Medical): No    Lack of Transportation (Non-Medical): No  Physical Activity: Not on file  Stress: Stress Concern Present (11/16/2023)   Received from [facility] of Occupational Health - Occupational Stress Questionnaire    Feeling of Stress : Very much  Social Connections: Not on file    Current Medications: Current Facility-Administered Medications  Medication Dose Route Frequency Provider Last Rate Last Admin   acetaminophen  (TYLENOL ) tablet 650 mg  650 mg Oral Q6H PRN McLauchlin, Angela, NP   650 mg  at 07/22/24 2041   alum & mag hydroxide-simeth (MAALOX/MYLANTA) 200-200-20 MG/5ML suspension 30 mL  30 mL Oral Q4H PRN McLauchlin, Angela, NP       clomiPRAMINE  (ANAFRANIL ) capsule 200 mg  200 mg Oral QHS Towana Leita SAILOR, MD   200 mg at 07/22/24 2103   clonazePAM  (KLONOPIN ) disintegrating tablet 0.5 mg  0.5 mg Oral QHS Towana Leita SAILOR, MD   0.5 mg at 07/22/24 2103   clonazePAM  (KLONOPIN ) tablet 0.5 mg  0.5 mg Oral BID PRN Towana Leita SAILOR, MD   0.5 mg at 07/22/24 1708   haloperidol  (HALDOL ) tablet 4 mg  4 mg Oral QHS Jamaurion Slemmer N, MD   4 mg at 07/22/24 2104   haloperidol  (HALDOL ) tablet 5 mg  5 mg Oral Q6H PRN Towana Leita SAILOR, MD       Or   haloperidol  lactate (HALDOL )  injection 5 mg  5 mg Intramuscular Q6H PRN Maciah Schweigert N, MD   5 mg at 07/06/24 1508   hydrOXYzine  (ATARAX ) tablet 25 mg  25 mg Oral TID PRN Pashayan, Alexander S, DO   25 mg at 07/19/24 9862   lamoTRIgine  (LAMICTAL ) tablet 250 mg  250 mg Oral Daily Towana Leita SAILOR, MD   250 mg at 07/23/24 9277   magnesium  hydroxide (MILK OF MAGNESIA) suspension 30 mL  30 mL Oral Daily PRN McLauchlin, Angela, NP       melatonin tablet 3 mg  3 mg Oral QHS Guillermina Shaft N, MD   3 mg at 07/22/24 2104   memantine  (NAMENDA ) tablet 10 mg  10 mg Oral BID Jahi Roza N, MD   10 mg at 07/23/24 9278   metFORMIN  (GLUCOPHAGE ) tablet 500 mg  500 mg Oral Q breakfast Towana Leita SAILOR, MD   500 mg at 07/23/24 9277   nystatin (MYCOSTATIN/NYSTOP) topical powder   Topical TID PRN Towana Leita SAILOR, MD   Given at 07/21/24 0820   nystatin cream (MYCOSTATIN)   Topical BID Towana Leita SAILOR, MD   1 Application at 07/23/24 0723   propranolol  ER (INDERAL  LA) 24 hr capsule 60 mg  60 mg Oral Daily Taneeka Curtner N, MD   60 mg at 07/23/24 9276   QUEtiapine  (SEROQUEL  XR) 24 hr tablet 300 mg  300 mg Oral QHS Ameliah Baskins N, MD   300 mg at 07/22/24 2103   traZODone  (DESYREL ) tablet 50 mg  50 mg Oral QHS Caldonia Leap N, MD   50 mg at 07/22/24 2103    Lab Results:  No results  found for this or any previous visit (from the past 48 hours).   Blood Alcohol level:  Lab Results  Component Value Date   [facility] <15 07/04/2024   ETH <10 08/10/2022    Metabolic Disorder Labs: Lab Results  Component Value Date   HGBA1C 5.4 07/04/2024   MPG 108.28 07/04/2024   MPG 119.76 12/07/2023   No results found for: PROLACTIN Lab Results  Component Value Date   CHOL 223 (H) 07/04/2024   TRIG 100 07/04/2024   HDL 53 07/04/2024   CHOLHDL 4.2 07/04/2024   VLDL 20 07/04/2024   LDLCALC 150 (H) 07/04/2024   LDLCALC 92 12/07/2023    Physical Findings:  Mental Status exam: Appearance: white female of slightly elevated BMI, appropriately groomed in galaxy tights and t-shirt, seen sitting in the day room with peers Eye contact: good  Attitude towards examiner: cooperative, pleasant  Psychomotor: no agitation or retardation  Speech: normal in prosody; slow and meandering Language: no delays  Mood: good   Affect: congruent, remains restricted but more reactive today Thought content: denying SI and HI (reporting intrusive thoughts of harming son but no desire or intent to act on thoughts), no delusions expressed  Thought Process: linear, organized and goal-directed  Perception: denying AVH, not RTIS  Insight: fair  Judgement: fair    Orientation: x3 Attention/Concentration: good - attends to interview  Memory/Cognition: grossly intact   Fund of Knowledge: Average      Musculoskeletal: Strength & Muscle Tone: within normal limits Gait & Station: normal Patient leans: N/A    Physical Exam Constitutional:      Appearance: Normal appearance.  HENT:     Head: Normocephalic and atraumatic.  Pulmonary:     Effort: Pulmonary effort is normal.  Abdominal:     General: There is no distension.  Musculoskeletal:  General: Normal range of motion.     Cervical back: Normal range of motion.  Neurological:     General: No focal deficit present.     Mental  Status: She is alert.    Review of Systems  All other systems reviewed and are negative.  Blood pressure 121/76, pulse 80, temperature 98.4 F (36.9 C), temperature source Oral, resp. rate (!) 22, height 5' 6 (1.676 m), weight 108.3 kg, SpO2 97%. Body mass index is 38.54 kg/m.   Treatment Plan Summary: Daily contact with patient to assess and evaluate symptoms and progress in treatment  Assessment: The patient is a 38 y.o. female with a psychiatric history most consistent with bipolar 1 disorder, current episode mixed and cluster B traits. She had carried a bipolar II diagnosis through much of adult life with worsening of symptoms occurring post-partum at age 47. Since then she has had 4 psychiatric admissions, 2 for credible manic episodes with psychosis. She has additionally presented with recurrent depressive episodes, although no suicide attempts. Mood symptoms are confounded by cluster B traits with clear history of unstable relationships, affective lability, poor coping, recurrent SI. On this admission the patient presents largely with concerns related to high anxiety and intrusive thoughts. Although reporting concerns for mania, aside from mildly reduced sleep (5 hrs) and rapidly cycling thoughts she does not present with endorsed active manic symptoms and does not present with observable signs of mania.  Given this admission was precipitated by several recent psychosocial stressors, personality pathology may be contributing to worsening of symptoms  to some degree. Regardless, she has endorsed significant distress with current intrusive thoughts of harm to self/others, overall high anxiety and urges to count to reduce this more consistent with an OCD picture.     The patient has had minimal improvements in subjective intrusive thoughts (sometimes described as hallucinations although with no additional objective evidence of psychosis) despite ongoign medication changes, including addition  of haldol  2 mg BID. She did not possible decrease in frequency and was documented to attend a group with better engagement yesterday. Clomipramine  was increased to 75 mg BID as of 11/10. Over the past 2-3 days the patient has reported intrusive thoughts as less frequent and less severe. Yesterday continued to voice subjective poor sleep and emotional numbing so haldol  was consolidated to evening time. She was move to regular floor to participate in groups and engage more with peers.   On 11/13, the patient was voicing improvements in mood and slightly brighter in affect, although continued to note emotional blunting. Intrusive thoughts less frequent and less severe. Increased evening clomipramine  to 100 mg 11/13 and then consolidated dose to 200 mg evening 11/14 to address daytime fatigue/blunting and to further improve sleep. Today the patient endorsed significantly improved sleep. Still groggy in the morning but energy has been better later in the afternoon. Reports ongoing improvements in frequency and intensity of intrusive thought, which is now confined only to the same one of throwing son in the pool. Able to more easily ignore it and engage in groups. Will continue current medications unchanged. Likely will try to decrease/discontinue some of her klonopin  over the next 1-2 days given improvements in sleep and anxiety.    DSM-5 diagnoses: Bipolar I disorder, current episode mixed, no current psychotic features  Obsessive-Compulsive disorder  Cluster B traits              -r/o BPD   Plan:   Legal Status: -voluntary    Safety -q15  minute checks  -elopement, suicide and assault precautions  -daily vitals   Psychiatric Concerns  -Continue Lamotrigine  250 mg daily for mood  -Continue Seroquel  XR to 300 mg at bedtime for sleep and mood stabilization  -Continue Namenda  10 mg BID for reported intrusive thoughts (patient takes at home and reports benefit) -Continue Trazodone  50 mg at bedtime  for sleep -Continue haldol  4 mg at bedtime for psychosis/mood stability -Continue Clomipramine  200 mg tonight for intrusive thoughts related to OCD -Continue Klonopin  0.5 mg BID PRN for anxiety + scheduled 0.5 mg at bedtime for sleep   -patient notified klonopin  will not be continued after hospital and to reserve for severe anxiety only -Continue Propranolol  ER 60 mg daily  -Continue PRN haldol  5 mg PO or IM q6h for agitation    Substance use concerns  -none currently    Nicotine  Replacement  N/A    Medical concerns Pre-diabetes -Current A1c WNL (5.4) but was elevated 7 months ago prior to beginning metformin  -Continue metformin  500 mg daily      Additional PRNs: -Tylenol  tablets 650 mg every 6 hours as needed for pain -Maalox/Mylanta suspension 30 mL every 4 hours as needed for indigestion  -Milk of Magnesia 30 mL daily as needed for constipation   Labs -Reviewed as documented in HPI   Psychosocial interventions  -daily medication management with psychiatry -Medication education regarding risks/benefits and alternatives -bedside psychotherapy as indicated  -Patient will be encouraged to participate and engage with group therapy  -Appreciate SW assistance in coordinating safe disposition    Leita LOISE Arts, MD 07/23/2024, 8:21 AM

## 2024-07-23 NOTE — Progress Notes (Signed)
(  Sleep Hours) - 7.5 (Any PRNs that were needed, meds refused, or side effects to meds)-  tylenol  (Any disturbances and when (visitation, over night)- none (Concerns raised by the patient)- none (SI/HI/AVH)- denies all

## 2024-07-23 NOTE — Group Note (Deleted)
 Date:  07/23/2024 Time:  9:11 AM  Group Topic/Focus:  Goals Group:   The focus of this group is to help patients establish daily goals to achieve during treatment and discuss how the patient can incorporate goal setting into their daily lives to aide in recovery.     Participation Level:  {BHH PARTICIPATION OZCZO:77735}  Participation Quality:  {BHH PARTICIPATION QUALITY:22265}  Affect:  {BHH AFFECT:22266}  Cognitive:  {BHH COGNITIVE:22267}  Insight: {BHH Insight2:20797}  Engagement in Group:  {BHH ENGAGEMENT IN GROUP:22268}  Modes of Intervention:  {BHH MODES OF INTERVENTION:22269}  Additional Comments:  ***  Kara Stephens Kara Stephens 07/23/2024, 9:11 AM

## 2024-07-23 NOTE — Progress Notes (Signed)
(  Sleep Hours) -9.25 (Any PRNs that were needed, meds refused, or side effects to meds)- tylenol  (Any disturbances and when (visitation, over night)- none (Concerns raised by the patient)- none (SI/HI/AVH)- denies all

## 2024-07-23 NOTE — Progress Notes (Signed)
   07/23/24 2141  Psych Admission Type (Psych Patients Only)  Admission Status Voluntary  Psychosocial Assessment  Eye Contact Fair  Facial Expression Flat  Affect Appropriate to circumstance  Speech Logical/coherent  Interaction Assertive  Motor Activity Slow  Appearance/Hygiene Unremarkable  Behavior Characteristics Cooperative  Mood Pleasant  Thought Process  Coherency WDL  Content WDL  Delusions None reported or observed  Perception WDL  Hallucination None reported or observed  Judgment Impaired  Confusion None  Danger to Self  Current suicidal ideation? Denies

## 2024-07-23 NOTE — Plan of Care (Signed)
  Problem: Education: Goal: Mental status will improve Outcome: Progressing   Problem: Activity: Goal: Interest or engagement in activities will improve Outcome: Progressing Goal: Sleeping patterns will improve Outcome: Progressing

## 2024-07-23 NOTE — Plan of Care (Signed)
   Problem: Activity: Goal: Interest or engagement in activities will improve Outcome: Progressing

## 2024-07-24 ENCOUNTER — Encounter (HOSPITAL_COMMUNITY): Payer: Self-pay

## 2024-07-24 DIAGNOSIS — F3162 Bipolar disorder, current episode mixed, moderate: Secondary | ICD-10-CM | POA: Diagnosis not present

## 2024-07-24 DIAGNOSIS — F429 Obsessive-compulsive disorder, unspecified: Secondary | ICD-10-CM | POA: Diagnosis not present

## 2024-07-24 MED ORDER — CLONAZEPAM 0.5 MG PO TABS
0.5000 mg | ORAL_TABLET | Freq: Every day | ORAL | Status: DC | PRN
  Administered 2024-07-24 – 2024-07-25 (×2): 0.5 mg via ORAL
  Filled 2024-07-24 (×2): qty 1

## 2024-07-24 NOTE — Progress Notes (Signed)
 [name]  07/24/2024 8:18 AM Kara Stephens  MRN:  [id number]  Principal Problem: Bipolar 1 disorder, mixed, moderate (HCC) Diagnosis: Principal Problem:   Bipolar 1 disorder, mixed, moderate (HCC) Active Problems:   Obsessive compulsive disorder  Total Time spent with patient: 35 minutes   Identifying Information and Past Psychiatric History:  The patient is a 38 y.o. female (domiciled with husband and children,) with a medical history of pre-diabetes and a psychiatric history most consistent with bipolar 1 disorder, OCD and cluster B traits, currently admitted due to high anxiety related to intrusive thoughts of suicide (without actual SI).   Psychiatric history notable for prior bipolar II diagnosis with approximately 5 years of stability on lamotrigine . Symptoms reoccurred with worsening severity after the birth of her second child at age 69. Since then she has had at least 2 credible manic symptoms with psychosis. Symptoms have included racing thoughts, reduced sleep, impulsivity, pressured speech and psychomotor agitation and have included psychotic symptoms such as hallucinations. In addition to elevated mood states, the patient has had recurrent depressive symptoms characterized by low mood, anhedonia, irritability, low energy and negative intrusive thoughts. She has had 4 hospitalizations in the last 2-3 years for depression and mania: March 2025 for mania, February 2025 for mania, December 2023 bipolar mixed episode with catatonic features and OCD. She was most recently admitted from 12/09/23-12/14/23 at [facility] self-admitted largely for anxiety symptoms, feeling that her mania was not well controlled. She has no prior suicide attempts. She has been on numerous medications for management of mood symptoms as noted below with numerous side effects/intolerances or poor effects. She does present with instability in relationships, poor coping, recurrent SI, affective lability concerning  for cluster B traits confounding mood symptoms. Patient has no prior suicide attempts. Currently follows outpatient with Dr. Chyrl Na for med management and she sees Velinda Beat for therapy.   Interval events: The patient has been attending groups, attentive and appropriate, cooperative with staff and appropriate with peers. Medication compliant. Slept 9.25 hrs overnight. BP 126/75 (BP Location: Left Arm)   Pulse 74   Temp 98.5 F (36.9 C) (Oral)   Resp 16   Ht 5' 6 (1.676 m)   Wt 108.3 kg   SpO2 98%   BMI 38.54 kg/m    Interview today: Today the patient states she is good. Reports the intrusive thought of throwing her son in the pool is barely there today. She has continued to sleep well. Believes her current medications are working exactly as needed. Discussed with her discontinuing the night-time klonopin  given improvements in sleep and reducing the PRN to once a day only until she leaves the hospital. Voiced agreement with this plan. Otherwise denies concerns. Feels that the groups are going well and that energy/emotional blunting is somewhat improving as well. No SI, HI or AVH reported today.   Collateral: Husband, Anthony Currington [phone]  -Called to give update today. He thinks she is doing a lot better; she has made good progress. Only thing he was going to ask is how to reintegrate with son when she gets home. Discussed going by Kristin's comfort level, starting with just mom and then brining in son. Kara Stephens notes she really does miss son and she feels bad about her thoughts.  He doesn't believe she would ever harm their son. They are still working with the plan that she will stay with mom for about a week before coming back home and bring son over.  Orie Goldberg, [phone]   -Last updated 11/12  Past Medical History:  Past Medical History:  Diagnosis Date   Anxiety    Back spasm    Bipolar I disorder (HCC)    Borderline personality disorder (HCC)    Class 3  obesity (HCC)    Cluster B personality disorder (HCC)    Hypercholesterolemia    Hypertension    Hypertriglyceridemia    Somatic symptom disorder    Tachycardia    Type 2 diabetes mellitus (HCC)    Vitamin D  insufficiency     Past Surgical History:  Procedure Laterality Date   APPENDECTOMY     CHOLECYSTECTOMY     HAND SURGERY     SHOULDER SURGERY     Family History:  Family History  Problem Relation Age of Onset   Hypertension Mother    Hyperlipidemia Mother    Suicidality Father    Depression Father    Social History:  Social History   Substance and Sexual Activity  Alcohol Use No     Social History   Substance and Sexual Activity  Drug Use No    Social History   Socioeconomic History   Marital status: Married    Spouse name: Not on file   Number of children: Not on file   Years of education: Not on file   Highest education level: Not on file  Occupational History   Not on file  Tobacco Use   Smoking status: Every Day    Current packs/day: 0.50    Average packs/day: 0.5 packs/day for 5.7 years (2.9 ttl pk-yrs)    Types: Cigarettes    Start date: 11/2018    Last attempt to quit: 09/10/2016   Smokeless tobacco: Never  Vaping Use   Vaping status: Never Used  Substance and Sexual Activity   Alcohol use: No   Drug use: No   Sexual activity: Not on file  Other Topics Concern   Not on file  Social History Narrative   Not on file   Social Drivers of Health   Financial Resource Strain: Not on file  Food Insecurity: Food Insecurity Present (07/04/2024)   Hunger Vital Sign    Worried About Running Out of Food in the Last Year: Sometimes true    Ran Out of Food in the Last Year: Never true  Transportation Needs: No Transportation Needs (07/04/2024)   PRAPARE - Administrator, Civil Service (Medical): No    Lack of Transportation (Non-Medical): No  Physical Activity: Not on file  Stress: Stress Concern Present (11/16/2023)   Received from  [facility] - Occupational Stress Questionnaire    Feeling of Stress : Very much  Social Connections: Not on file    Current Medications: Current Facility-Administered Medications  Medication Dose Route Frequency Provider Last Rate Last Admin   acetaminophen  (TYLENOL ) tablet 650 mg  650 mg Oral Q6H PRN McLauchlin, Angela, NP   650 mg at 07/23/24 1947   alum & mag hydroxide-simeth (MAALOX/MYLANTA) 200-200-20 MG/5ML suspension 30 mL  30 mL Oral Q4H PRN McLauchlin, Angela, NP       clomiPRAMINE  (ANAFRANIL ) capsule 200 mg  200 mg Oral QHS Towana Leita SAILOR, MD   200 mg at 07/23/24 2101   clonazePAM  (KLONOPIN ) disintegrating tablet 0.5 mg  0.5 mg Oral QHS Towana Leita SAILOR, MD   0.5 mg at 07/23/24 2101   clonazePAM  (KLONOPIN ) tablet 0.5 mg  0.5 mg Oral BID PRN  Towana Leita SAILOR, MD   0.5 mg at 07/23/24 1252   haloperidol  (HALDOL ) tablet 4 mg  4 mg Oral QHS Kailoni Vahle N, MD   4 mg at 07/23/24 2100   haloperidol  (HALDOL ) tablet 5 mg  5 mg Oral Q6H PRN Towana Leita SAILOR, MD       Or   haloperidol  lactate (HALDOL ) injection 5 mg  5 mg Intramuscular Q6H PRN Jalaina Salyers N, MD   5 mg at 07/06/24 1508   hydrOXYzine  (ATARAX ) tablet 25 mg  25 mg Oral TID PRN Pashayan, Alexander S, DO   25 mg at 07/19/24 9862   lamoTRIgine  (LAMICTAL ) tablet 250 mg  250 mg Oral Daily Towana Leita SAILOR, MD   250 mg at 07/23/24 9277   magnesium  hydroxide (MILK OF MAGNESIA) suspension 30 mL  30 mL Oral Daily PRN McLauchlin, Angela, NP       melatonin tablet 3 mg  3 mg Oral QHS October Peery N, MD   3 mg at 07/23/24 2101   memantine  (NAMENDA ) tablet 10 mg  10 mg Oral BID Issam Carlyon N, MD   10 mg at 07/23/24 1708   metFORMIN  (GLUCOPHAGE ) tablet 500 mg  500 mg Oral Q breakfast Towana Leita SAILOR, MD   500 mg at 07/23/24 9277   nystatin (MYCOSTATIN/NYSTOP) topical powder   Topical TID PRN Towana Leita SAILOR, MD   Given at 07/21/24 0820   nystatin cream (MYCOSTATIN)   Topical BID Towana Leita SAILOR,  MD   1 Application at 07/23/24 1705   propranolol  ER (INDERAL  LA) 24 hr capsule 60 mg  60 mg Oral Daily Warnie Belair N, MD   60 mg at 07/23/24 9276   QUEtiapine  (SEROQUEL  XR) 24 hr tablet 300 mg  300 mg Oral QHS Hanah Moultry N, MD   300 mg at 07/23/24 2101   traZODone  (DESYREL ) tablet 50 mg  50 mg Oral QHS Jakarius Flamenco N, MD   50 mg at 07/23/24 2101    Lab Results:  No results found for this or any previous visit (from the past 48 hours).   Blood Alcohol level:  Lab Results  Component Value Date   [facility] <15 07/04/2024   ETH <10 08/10/2022    Metabolic Disorder Labs: Lab Results  Component Value Date   HGBA1C 5.4 07/04/2024   MPG 108.28 07/04/2024   MPG 119.76 12/07/2023   No results found for: PROLACTIN Lab Results  Component Value Date   CHOL 223 (H) 07/04/2024   TRIG 100 07/04/2024   HDL 53 07/04/2024   CHOLHDL 4.2 07/04/2024   VLDL 20 07/04/2024   LDLCALC 150 (H) 07/04/2024   LDLCALC 92 12/07/2023    Physical Findings:  Mental Status exam: Appearance: white female of slightly elevated BMI, appropriately groomed in casual clothing, seen watching movie with peers in the day room.  Eye contact: good  Attitude towards examiner: cooperative, pleasant  Psychomotor: no agitation or retardation  Speech: normal in prosody; slow and meandering Language: no delays  Mood: good   Affect: congruent, remains restricted, increasingly reactive  Thought content: denying SI and HI (reporting intrusive thoughts of throwing son but no desire or intent to act on thoughts - intrusive thoughts improved), no delusions expressed  Thought Process: linear, organized and goal-directed  Perception: denying AVH, not RTIS  Insight: fair to good   Judgement: fair to good    Orientation: x3 Attention/Concentration: good - attends to interview  Memory/Cognition: grossly intact   Fund of Knowledge:  Average      Musculoskeletal: Strength & Muscle Tone: within normal limits Gait &  Station: normal Patient leans: N/A    Physical Exam Constitutional:      Appearance: Normal appearance.  HENT:     Head: Normocephalic and atraumatic.  Pulmonary:     Effort: Pulmonary effort is normal.  Abdominal:     General: There is no distension.  Musculoskeletal:        General: Normal range of motion.     Cervical back: Normal range of motion.  Neurological:     General: No focal deficit present.     Mental Status: She is alert.    Review of Systems  All other systems reviewed and are negative.  Blood pressure 126/75, pulse 74, temperature 98.5 F (36.9 C), temperature source Oral, resp. rate 16, height 5' 6 (1.676 m), weight 108.3 kg, SpO2 98%. Body mass index is 38.54 kg/m.   Treatment Plan Summary: Daily contact with patient to assess and evaluate symptoms and progress in treatment  Assessment: The patient is a 38 y.o. female with a psychiatric history most consistent with bipolar 1 disorder, current episode mixed and cluster B traits. She had carried a bipolar II diagnosis through much of adult life with worsening of symptoms occurring post-partum at age 29. Since then she has had 4 psychiatric admissions, 2 for credible manic episodes with psychosis. She has additionally presented with recurrent depressive episodes, although no suicide attempts. Mood symptoms are confounded by cluster B traits with clear history of unstable relationships, affective lability, poor coping, recurrent SI. On this admission the patient presents largely with concerns related to high anxiety and intrusive thoughts. Although reporting concerns for mania, aside from mildly reduced sleep (5 hrs) and rapidly cycling thoughts she does not present with endorsed active manic symptoms and does not present with observable signs of mania.  Given this admission was precipitated by several recent psychosocial stressors, personality pathology may be contributing to worsening of symptoms  to some degree.  Regardless, she has endorsed significant distress with current intrusive thoughts of harm to self/others, overall high anxiety and urges to count to reduce this more consistent with an OCD picture.      Patient's haldol  was consolidated to 4 mg at bedtime due to reports of ongoing emotional blunting and subjective poor sleep. On 11/13, the patient was voicing improvements in mood and slightly brighter in affect, although continued to note emotional blunting. Intrusive thoughts less frequent and less severe. Increased evening clomipramine  to 100 mg 11/13 and then consolidated dose to 200 mg evening 11/14 to address daytime fatigue/blunting and to further improve sleep. Today and yesterday the patient endorsed significantly improved sleep and ongoing improvements in frequency and intensity of intrusive thoughts about son. Able to more easily ignore it and engage in groups. Given significant improvements in sleep, will plan to discontinue nighttime klonopin  tonight. She has been utilizing PRN daily klonopin  once-daily on average. Will order klonopion to be once daily PRN to take during the day or at night. Will continue other medications unchanged.    DSM-5 diagnoses: Bipolar I disorder, current episode mixed, no current psychotic features  Obsessive-Compulsive disorder  Cluster B traits              -r/o BPD   Plan:   Legal Status: -voluntary    Safety -q15 minute checks  -elopement, suicide and assault precautions  -daily vitals   Psychiatric Concerns  -Continue Lamotrigine  250 mg daily for mood  -  Continue Seroquel  XR to 300 mg at bedtime for sleep and mood stabilization  -Continue Namenda  10 mg BID for reported intrusive thoughts (patient takes at home and reports benefit) -Continue Trazodone  50 mg at bedtime for sleep -Continue haldol  4 mg at bedtime for psychosis/mood stability -Continue Clomipramine  200 mg tonight for intrusive thoughts related to OCD -reduce klonopin  to 0.5 daily  PRN; discontinue scheduled nighttime dose  -Continue Propranolol  ER 60 mg daily  -Continue PRN haldol  5 mg PO or IM q6h for agitation    Substance use concerns  -none currently    Nicotine  Replacement  N/A    Medical concerns Pre-diabetes -Current A1c WNL (5.4) but was elevated 7 months ago prior to beginning metformin  -Continue metformin  500 mg daily      Additional PRNs: -Tylenol  tablets 650 mg every 6 hours as needed for pain -Maalox/Mylanta suspension 30 mL every 4 hours as needed for indigestion  -Milk of Magnesia 30 mL daily as needed for constipation   Labs -Reviewed as documented in HPI   Psychosocial interventions  -daily medication management with psychiatry -Medication education regarding risks/benefits and alternatives -bedside psychotherapy as indicated  -Patient will be encouraged to participate and engage with group therapy  -Appreciate SW assistance in coordinating safe disposition    Leita LOISE Arts, MD 07/24/2024, 8:18 AM

## 2024-07-24 NOTE — Group Note (Signed)
 Date:  07/24/2024 Time:  10:36 AM  Group Topic/Focus: Recreational Therapy  Patients played blind picturing  where the speaker describes the geometrical image in detail then the drawer has to recreate the image of the picture.      Participation Level:  Active    Additional Comments:  Patient attend  Kara Stephens 07/24/2024, 10:36 AM

## 2024-07-24 NOTE — Group Note (Signed)
 Occupational Therapy Group Note   Group Topic:Goal Setting  Group Date: 07/24/2024 Start Time: 1500 End Time: 1539 Facilitators: Dot [place] MATSU, OT   Group Description: Group encouraged engagement and participation through discussion focused on goal setting. Group members were introduced to goal-setting using the SMART Goal framework, identifying goals as Specific, Measureable, Acheivable, Relevant, and Time-Bound. Group members took time from group to create their own personal goal reflecting the SMART goal template and shared for review by peers and OT.    Therapeutic Goal(s):  Identify at least one goal that fits the SMART framework    Participation Level: Engaged   Participation Quality: Independent   Behavior: Appropriate   Speech/Thought Process: Relevant   Affect/Mood: Appropriate   Insight: Fair   Judgement: Fair      Modes of Intervention: Education  Patient Response to Interventions:  Attentive   Plan: Continue to engage patient in OT groups 2 - 3x/week.  07/24/2024  [place] MATSU Dot, OT  Rylen Hou, OT

## 2024-07-24 NOTE — BHH Group Notes (Signed)
 The focus of this group is to help patients review their daily goal of treatment and discuss progress on daily workbooks. Pt was attentive and appropriate during tonight's wrap up group discussion with aa.

## 2024-07-24 NOTE — Group Note (Signed)
 Recreation Therapy Group Note   Group Topic:Communication  Group Date: 07/24/2024 Start Time: 0945 End Time: 1020 Facilitators: Timmy Bubeck-McCall, LRT,CTRS Location: [address] Dayroom   Group Topic: Communication, Problem Solving   Goal Area(s) Addresses:  Patient will effectively listen to complete activity.  Patient will identify communication skills used to make activity successful.  Patient will identify how skills used during activity can be used to reach post d/c goals.    Behavioral Response: Engaged   Intervention: Building Surveyor Activity - Geometric pattern cards, pencils, blank paper    Activity: Geometric Drawings.  Three volunteers from the peer group will be shown an abstract picture with a particular arrangement of geometrical shapes.  Each round, one 'speaker' will describe the pattern, as accurately as possible without revealing the image to the group.  The remaining group members will listen and draw the picture to reflect how it is described to them. Patients with the role of 'listener' cannot ask clarifying questions but, may request that the speaker repeat a direction. Once the drawings are complete, the presenter will show the rest of the group the picture and compare how close each person came to drawing the picture. LRT will facilitate a post-activity discussion regarding effective communication and the importance of planning, listening, and asking for clarification in daily interactions with others.  Education: Environmental consultant, Active listening, Support systems, Discharge planning  Education Outcome: Acknowledges understanding/In group clarification offered/Needs additional education.    Affect/Mood: Appropriate   Participation Level: Engaged   Participation Quality: Independent   Behavior: Appropriate   Speech/Thought Process: Focused   Insight: Good   Judgement: Good   Modes of Intervention: Activity   Patient Response to  Interventions:  Engaged   Education Outcome:  In group clarification offered    Clinical Observations/Individualized Feedback: Pt was engaged and focused during group session. Pt actively participated in activity.      Plan: Continue to engage patient in RT group sessions 2-3x/week.   Shanee Batch-McCall, LRT,CTRS  07/24/2024 1:03 PM

## 2024-07-24 NOTE — Progress Notes (Signed)
 Spiritual care group on grief and loss facilitated by Chaplain Rockie Sofia, Bcc  Group Goal: Support / Education around grief and loss  Members engage in facilitated group support and psycho-social education.  Group Description:  Following introductions and group rules, group members engaged in facilitated group dialogue and support around topic of loss, with particular support around experiences of loss in their lives. Group Identified types of loss (relationships / self / things) and identified patterns, circumstances, and changes that precipitate losses. Reflected on thoughts / feelings around loss, normalized grief responses, and recognized variety in grief experience. Group encouraged individual reflection on safe space and on the coping skills that they are already utilizing.  Group drew on Adlerian / Rogerian and narrative framework  Patient Progress: Kara Stephens attended group and actively engaged and participated in group conversation and activities. Affect appeared lighter even given the topic. Comments demonstrated insight and contributed positively to the group conversation.

## 2024-07-24 NOTE — Group Note (Signed)
 Date:  07/24/2024 Time:  11:16 AM  Group Topic/Focus: Emotional wellness Emotional Education:   The focus of this group is to discuss what feelings/emotions are, and how they are experienced.    Participation Level:  Active  Participation Quality:  Appropriate  Affect:  Appropriate  Cognitive:  Alert and Appropriate  Insight: Appropriate and Good  Engagement in Group:  Engaged and Improving  Modes of Intervention:  Education  Additional Comments:  Patient attend  Kara Stephens 07/24/2024, 11:16 AM

## 2024-07-24 NOTE — Group Note (Signed)
 Date:  07/24/2024 Time:  2:01 PM  Group Topic/Focus: Chaplin Spirituality:   The focus of this group is to discuss how one's spirituality can aide in recovery.    Participation Level:  Active   Additional Comments:  Patient attend  Kara Stephens 07/24/2024, 2:01 PM

## 2024-07-24 NOTE — Progress Notes (Signed)
 Kara Stephens requested to meet after group.  I have met with her several times over this hospitalization and she was requesting prayer before she discharges.  Her affect seems much lighter and she shared that she feels like she is doing well.  She shared that the team had been concerned about depression symptoms and lack of feeling, but she shared that she feels like she is not a very emotionally expressive person at baseline. She is relieved that the thoughts are no longer present.  I provided spiritual support through prayer and listening as I accompanied her through this inpatient stay.

## 2024-07-24 NOTE — Group Note (Signed)
 Date:  07/24/2024 Time:  1:29 PM  Group Topic/Focus: Physical Wellness  Physical wellness and mental health are deeply connected. When you take care of your body, you support your brain's ability to manage stress, regulate emotions, and function more effectively. Here's how physical wellness boosts mental well-being--and how to start improving both:    Participation Level:  Active  Participation Quality:  Appropriate  Affect:  Appropriate  Cognitive:  Alert and Appropriate  Insight: Appropriate and Good  Engagement in Group:  Engaged and Improving  Modes of Intervention:  Discussion and Education  Additional Comments:  Patient attend  Kara Stephens 07/24/2024, 1:29 PM

## 2024-07-24 NOTE — Plan of Care (Signed)
  Problem: Education: Goal: Mental status will improve Outcome: Progressing Goal: Verbalization of understanding the information provided will improve Outcome: Progressing   Problem: Activity: Goal: Interest or engagement in activities will improve Outcome: Progressing   Problem: Education: Goal: Emotional status will improve Outcome: Not Progressing

## 2024-07-24 NOTE — Group Note (Signed)
 Date:  07/24/2024 Time:  10:10 AM  Group Topic/Focus: Goals group Orientation:   The focus of this group is to educate the patient on the purpose and policies of crisis stabilization and provide a format to answer questions about their admission.  The group details unit policies and expectations of patients while admitted.    Participation Level:  Active  Participation Quality:  Appropriate  Affect:  Appropriate  Cognitive:  Alert and Oriented  Insight: Appropriate  Engagement in Group:  Engaged and Improving  Modes of Intervention:  Orientation  Additional Comments:  Patient attend  Kara Stephens 07/24/2024, 10:10 AM

## 2024-07-24 NOTE — BH IP Treatment Plan (Signed)
 Interdisciplinary Treatment and Diagnostic Plan Update  07/24/2024 Time of Session: 11:30 AM - UPDATE Kara Stephens MRN: [id number]  Principal Diagnosis: Bipolar 1 disorder, mixed, moderate (HCC)  Secondary Diagnoses: Principal Problem:   Bipolar 1 disorder, mixed, moderate (HCC) Active Problems:   Obsessive compulsive disorder   Current Medications:  Current Facility-Administered Medications  Medication Dose Route Frequency Provider Last Rate Last Admin   acetaminophen  (TYLENOL ) tablet 650 mg  650 mg Oral Q6H PRN McLauchlin, Angela, NP   650 mg at 07/23/24 1947   alum & mag hydroxide-simeth (MAALOX/MYLANTA) 200-200-20 MG/5ML suspension 30 mL  30 mL Oral Q4H PRN McLauchlin, Angela, NP       clomiPRAMINE  (ANAFRANIL ) capsule 200 mg  200 mg Oral QHS Towana Leita SAILOR, MD   200 mg at 07/23/24 2101   clonazePAM  (KLONOPIN ) tablet 0.5 mg  0.5 mg Oral Daily PRN Towana Leita SAILOR, MD       haloperidol  (HALDOL ) tablet 4 mg  4 mg Oral QHS Butler, Laura N, MD   4 mg at 07/23/24 2100   haloperidol  (HALDOL ) tablet 5 mg  5 mg Oral Q6H PRN Towana Leita SAILOR, MD       Or   haloperidol  lactate (HALDOL ) injection 5 mg  5 mg Intramuscular Q6H PRN Butler, Laura N, MD   5 mg at 07/06/24 1508   hydrOXYzine  (ATARAX ) tablet 25 mg  25 mg Oral TID PRN Pashayan, Alexander S, DO   25 mg at 07/19/24 9862   lamoTRIgine  (LAMICTAL ) tablet 250 mg  250 mg Oral Daily Towana Leita SAILOR, MD   250 mg at 07/24/24 0831   magnesium  hydroxide (MILK OF MAGNESIA) suspension 30 mL  30 mL Oral Daily PRN McLauchlin, Angela, NP       melatonin tablet 3 mg  3 mg Oral QHS Towana Leita SAILOR, MD   3 mg at 07/23/24 2101   memantine  (NAMENDA ) tablet 10 mg  10 mg Oral BID Towana Leita SAILOR, MD   10 mg at 07/24/24 1706   metFORMIN  (GLUCOPHAGE ) tablet 500 mg  500 mg Oral Q breakfast Towana Leita SAILOR, MD   500 mg at 07/24/24 0834   nystatin (MYCOSTATIN/NYSTOP) topical powder   Topical TID PRN Towana Leita SAILOR, MD   Given at 07/21/24 0820   nystatin  cream (MYCOSTATIN)   Topical BID Towana Leita SAILOR, MD   Given at 07/24/24 1705   propranolol  ER (INDERAL  LA) 24 hr capsule 60 mg  60 mg Oral Daily Butler, Laura N, MD   60 mg at 07/24/24 9165   QUEtiapine  (SEROQUEL  XR) 24 hr tablet 300 mg  300 mg Oral QHS Butler, Laura N, MD   300 mg at 07/23/24 2101   traZODone  (DESYREL ) tablet 50 mg  50 mg Oral QHS Butler, Laura N, MD   50 mg at 07/23/24 2101   PTA Medications: Medications Prior to Admission  Medication Sig Dispense Refill Last Dose/Taking   albuterol  (VENTOLIN  HFA) 108 (90 Base) MCG/ACT inhaler Inhale 2 puffs into the lungs every 4 (four) hours as needed for wheezing or shortness of breath (cough, shortness of breath or wheezing.). (Patient not taking: Reported on 07/04/2024) 6.7 g 3    aspirin EC 81 MG tablet Take 81 mg by mouth daily. Swallow whole.      Cholecalciferol  (VITAMIN D -3 PO) Take 1 capsule by mouth daily.      clonazePAM  (KLONOPIN ) 0.5 MG tablet Take 0.5 mg by mouth daily as needed for anxiety.  lamoTRIgine  (LAMICTAL ) 200 MG tablet Take 200 mg by mouth daily.      memantine  (NAMENDA ) 10 MG tablet Take 1 tablet (10 mg total) by mouth every 12 (twelve) hours. 60 tablet 0    metFORMIN  (GLUCOPHAGE -XR) 500 MG 24 hr tablet Take 1 tablet (500 mg total) by mouth daily with breakfast. (Patient taking differently: Take 1,000 mg by mouth daily with breakfast.) 30 tablet 0    Multiple Vitamin (MULTIVITAMIN WITH MINERALS) TABS tablet Take 1 tablet by mouth daily.      propranolol  ER (INDERAL  LA) 60 MG 24 hr capsule Take 1 capsule (60 mg total) by mouth daily. 30 capsule 0    QUEtiapine  (SEROQUEL ) 200 MG tablet Take 200 mg by mouth at bedtime.      traZODone  (DESYREL ) 100 MG tablet Take 1 tablet (100 mg total) by mouth at bedtime as needed for sleep. (Patient taking differently: Take 100-200 mg by mouth at bedtime.) 30 tablet 0     Patient Stressors: Traumatic event    Patient Strengths: Capable of independent living  Investment banker, corporate  Motivation for treatment/growth  Supportive family/friends   Treatment Modalities: Medication Management, Group therapy, Case management,  1 to 1 session with clinician, Psychoeducation, Recreational therapy.   Physician Treatment Plan for Primary Diagnosis: Bipolar 1 disorder, mixed, moderate (HCC) Long Term Goal(s):     Short Term Goals:    Medication Management: Evaluate patient's response, side effects, and tolerance of medication regimen.  Therapeutic Interventions: 1 to 1 sessions, Unit Group sessions and Medication administration.  Evaluation of Outcomes: Progressing  Physician Treatment Plan for Secondary Diagnosis: Principal Problem:   Bipolar 1 disorder, mixed, moderate (HCC) Active Problems:   Obsessive compulsive disorder  Long Term Goal(s):     Short Term Goals:       Medication Management: Evaluate patient's response, side effects, and tolerance of medication regimen.  Therapeutic Interventions: 1 to 1 sessions, Unit Group sessions and Medication administration.  Evaluation of Outcomes: Progressing   RN Treatment Plan for Primary Diagnosis: Bipolar 1 disorder, mixed, moderate (HCC) Long Term Goal(s): Knowledge of disease and therapeutic regimen to maintain health will improve  Short Term Goals: Ability to remain free from injury will improve, Ability to verbalize frustration and anger appropriately will improve, Ability to verbalize feelings will improve, and Ability to disclose and discuss suicidal ideas  Medication Management: RN will administer medications as ordered by provider, will assess and evaluate patient's response and provide education to patient for prescribed medication. RN will report any adverse and/or side effects to prescribing provider.  Therapeutic Interventions: 1 on 1 counseling sessions, Psychoeducation, Medication administration, Evaluate responses to treatment, Monitor vital signs and CBGs as ordered, Perform/monitor CIWA, COWS,  AIMS and Fall Risk screenings as ordered, Perform wound care treatments as ordered.  Evaluation of Outcomes: Progressing   LCSW Treatment Plan for Primary Diagnosis: Bipolar 1 disorder, mixed, moderate (HCC) Long Term Goal(s): Safe transition to appropriate next level of care at discharge, Engage patient in therapeutic group addressing interpersonal concerns.  Short Term Goals: Engage patient in aftercare planning with referrals and resources, Increase social support, Facilitate acceptance of mental health diagnosis and concerns, and Facilitate patient progression through stages of change regarding substance use diagnoses and concerns  Therapeutic Interventions: Assess for all discharge needs, 1 to 1 time with Social worker, Explore available resources and support systems, Assess for adequacy in community support network, Educate family and significant other(s) on suicide prevention, Complete Psychosocial Assessment, Interpersonal group therapy.  Evaluation of Outcomes: Progressing   Progress in Treatment: Attending groups: Yes. Participating in groups: Yes. Taking medication as prescribed: Yes. Toleration medication: Yes. Family/Significant other contact made: Yes. contacted:  Briseis Aguilera (husband) [phone] and Vickie Sexton (mom) [phone]  Patient understands diagnosis: Yes. Discussing patient identified problems/goals with staff: Yes. Medical problems stabilized or resolved: Yes. Denies suicidal/homicidal ideation: No. HI and SI. HI towards roommate and others on the unit  Issues/concerns per patient self-inventory: No.   New problem(s) identified: Yes, Describe:  pt requesting a room on [address] hall, constant supervision or to spend time in the quiet room   New Short Term/Long Term Goal(s): medication stabilization, elimination of SI thoughts, development of comprehensive mental wellness plan.      Patient Goals:  Get out of the manic state, lessen the intrusive thoughts  of wanting to choke out my roommate, and increase my sleep   Discharge Plan or Barriers: Patient recently admitted. CSW will continue to follow and assess for appropriate referrals and possible discharge planning.      Reason for Continuation of Hospitalization: Homicidal ideation Medication stabilization Suicidal ideation Other; describe bipolar   Estimated Length of Stay: 2 - 3 days  Last 3 [place] Suicide Severity Risk Score: Flowsheet Row [facility]Current[facility] Most recent reading at 07/04/2024 10:27 PM [facility] Most recent reading at 07/04/2024  3:43 PM Admission (Discharged) [facility] Most recent reading at 12/09/2023  3:30 PM  C-SSRS RISK CATEGORY Moderate Risk Moderate Risk No Risk    Last PHQ 2/9 Scores:    08/10/2022    7:45 PM  Depression screen PHQ 2/9  Decreased Interest 3  Down, Depressed, Hopeless 3  PHQ - 2 Score 6  Altered sleeping 3  Tired, decreased energy 1  Change in appetite 3  Feeling bad or failure about yourself  1  Trouble concentrating 2  Moving slowly or fidgety/restless 2  Suicidal thoughts 2  PHQ-9 Score 20   Difficult doing work/chores Very difficult     Data saved with a previous flowsheet row definition    Scribe for Treatment Team: Jaken Fregia O Adolph Clutter, LCSWA 07/24/2024 5:59 PM

## 2024-07-25 DIAGNOSIS — F429 Obsessive-compulsive disorder, unspecified: Secondary | ICD-10-CM | POA: Diagnosis not present

## 2024-07-25 DIAGNOSIS — F3162 Bipolar disorder, current episode mixed, moderate: Secondary | ICD-10-CM | POA: Diagnosis not present

## 2024-07-25 NOTE — Group Note (Signed)
 LCSW Group Therapy Note   Group Date: 07/25/2024 Start Time: 1100 End Time: 1200   Participation:  patient was present.  She listened and was respectful but didn't participate in the discussion.  Type of Therapy:  Group Therapy  Topic:  Shining from Within:  Confidence and Self-Love Journey   Objective:  To support participants in developing confidence and self-love through self-awareness, self-compassion, and practical skills that nurture personal growth.   Group Goals Encourage self-reflection and self-acceptance by identifying personal strengths and achievements. Teach skills to challenge negative self-talk and replace it with supportive, truthful self-talk. Foster resilience and self-worth through owens & minor, gratitude, and self-care practices.   Summary:  This group explores the connection between confidence and self-love by guiding participants through reflection, mindset shifts, and practical tools like affirmations, strength recognition, and goal-setting. Activities are designed to promote self-compassion, build emotional resilience, and normalize the slow, patient journey of inner growth.   Therapeutic Modalities Used Cognitive Behavioral Therapy (CBT): Challenging and reframing unhelpful self-talk. Motivational Interviewing (MI): Encouraging small, achievable goals. Elements of Dialectical Behavioral Therapist (DBT):  Mindfulness and Self-Compassion: Promoting present-moment awareness and kindness toward self.   Joclynn Lumb O Madicyn Mesina, LCSWA 07/25/2024  12:19 PM

## 2024-07-25 NOTE — Plan of Care (Signed)
   Problem: Education: Goal: Emotional status will improve Outcome: Progressing Goal: Mental status will improve Outcome: Progressing Goal: Verbalization of understanding the information provided will improve Outcome: Progressing   Problem: Activity: Goal: Interest or engagement in activities will improve Outcome: Progressing

## 2024-07-25 NOTE — Group Note (Signed)
 Date:  07/25/2024 Time:  10:22 AM  Group Topic/Focus: Pet therapy  is a therapeutic approach where trained animals most commonly dogs, cats, horses, or even rabbits are used to help improve the emotional, social, and mental well-being of people living with mental health conditions.   Participation Level:  Active  Additional Comments:  Patient attend  Kara Stephens 07/25/2024, 10:22 AM

## 2024-07-25 NOTE — Group Note (Signed)
 Date:  07/25/2024 Time:  12:13 PM  Group Topic/Focus: Social work Group Social work helps clients with low confidence by assessing challenges, empowering them through skills and support, fostering self-esteem, and helping them navigate social and printmaker. Confidence is both a goal and a tool for mental health recovery.    Participation Level:  Active    Additional Comments:  Patient attend  Kara Stephens 07/25/2024, 12:13 PM

## 2024-07-25 NOTE — Plan of Care (Signed)
   Problem: Education: Goal: Knowledge of [place] [facility] Education information/materials will improve Outcome: Progressing Goal: Emotional status will improve Outcome: Progressing Goal: Mental status will improve Outcome: Progressing

## 2024-07-25 NOTE — Group Note (Signed)
 Recreation Therapy Group Note   Group Topic:Animal Assisted Therapy   Group Date: 07/25/2024 Start Time: 0945 End Time: 1030 Facilitators: Lashante Fryberger-McCall, LRT,CTRS Location: [address] Hall Dayroom   Animal-Assisted Activity (AAA) Program Checklist/Progress Notes Patient Eligibility Criteria Checklist & Daily Group note for Rec Tx Intervention  AAA/T Program Assumption of Risk Form signed by Patient/ or Parent Legal Guardian Yes  Patient is free of allergies or severe asthma Yes  Patient reports no fear of animals Yes  Patient reports no history of cruelty to animals Yes  Patient understands his/her participation is voluntary Yes  Patient washes hands before animal contact Yes  Patient washes hands after animal contact Yes  Behavioral Response: Engaged   Education: Charity Fundraiser, Appropriate Animal Interaction   Education Outcome: Acknowledges education.    Affect/Mood: Appropriate   Participation Level: Engaged   Participation Quality: Independent   Behavior: Appropriate   Speech/Thought Process: Focused   Insight: Good   Judgement: Good   Modes of Intervention: Teaching Laboratory Technician   Patient Response to Interventions:  Engaged   Education Outcome:  In group clarification offered    Clinical Observations/Individualized Feedback: Patient attended session and interacted appropriately with therapy dog and peers. Patient asked appropriate questions about therapy dog and his training. Patient shared stories about their pets at home with group.      Plan: Continue to engage patient in RT group sessions 2-3x/week.   Kara Stephens, LRT,CTRS 07/25/2024 12:30 PM

## 2024-07-25 NOTE — Group Note (Signed)
 Date:  07/25/2024 Time:  10:02 AM  Group Topic/Focus: Coping and Goals orientation Goals Group:   The focus of this group is to help patients establish daily goals to achieve during treatment and discuss how the patient can incorporate goal setting into their daily lives to aide in recovery. Coping skills for patients (more respectfully called people living with mental health conditions) are tools, strategies, or behaviors that help a person manage stress, difficult emotions, symptoms, and challenging situations.   Participation Level:  Active  Participation Quality:  Appropriate  Affect:  Appropriate  Cognitive:  Alert and Appropriate  Insight: Appropriate, Good, and Improving  Engagement in Group:  Engaged and Improving  Modes of Intervention:  Discussion  Additional Comments:  Patient attend  Kara Stephens 07/25/2024, 10:02 AM

## 2024-07-25 NOTE — Progress Notes (Signed)
 [facility] MD Progress Note  07/25/2024 3:00 PM Kara Stephens  MRN:  [id number] Subjective:   Kara Stephens is a 38 yr old female who presented on 10/28 to [facility] due to intrusive Suicidal Thoughts and Homicidal Thoughts, she was admitted to [facility] on 10/29.  PPHx is significant for Bipolar Disorder, OCD, and Cluster B traits, and 4 Prior Psychiatric Hospitalizations (last [facility] 12/2023), and no history of Suicide Attempts.   Case was discussed in the multidisciplinary team. MAR was reviewed and patient was compliant with medications.  She received PRN Klonopin  last night.   Psychiatric Team made the following recommendations yesterday: -Continue Clomipramine  200 mg for OCD -Continue Lamictal  250 mg daily for mood stability -Continue Haldol  4 mg for psychosis and mood stability -Continue Seroquel  XR 300 mg QHS for mood stability and sleep -Continue Namenda  10 mg BID for reported intrusive thoughts (patient takes at home and reports benefit)  -Continue Trazodone  50 mg QHS -reduce klonopin  to 0.5 daily PRN; discontinue scheduled nighttime dose      On interview today patient reports she slept fair last night.  She reports her appetite is doing good.  She reports no SI, HI, or AVH.  She reports no Paranoia or Ideas of Reference.  She reports no issues with her medications.  She reports that she is only having rare fleeting intrusive thoughts and is able to quickly dispell them.  She reports she is looking forward to discharge tomorrow.  She reports no other concerns at present.    Principal Problem: Bipolar 1 disorder, mixed, moderate (HCC) Diagnosis: Principal Problem:   Bipolar 1 disorder, mixed, moderate (HCC) Active Problems:   Obsessive compulsive disorder  Total Time spent with patient:  I personally spent 35 minutes on the unit in direct patient care. The direct patient care time included face-to-face time with the patient, reviewing the patient's chart, communicating with other  professionals, and coordinating care.    Past Psychiatric History:  Bipolar Disorder, OCD, and Cluster B traits, and 4 Prior Psychiatric Hospitalizations (last [facility] 12/2023), and no history of Suicide Attempts.  Past Medical History:  Past Medical History:  Diagnosis Date   Anxiety    Back spasm    Bipolar I disorder (HCC)    Borderline personality disorder (HCC)    Class 3 obesity (HCC)    Cluster B personality disorder (HCC)    Hypercholesterolemia    Hypertension    Hypertriglyceridemia    Somatic symptom disorder    Tachycardia    Type 2 diabetes mellitus (HCC)    Vitamin D  insufficiency     Past Surgical History:  Procedure Laterality Date   APPENDECTOMY     CHOLECYSTECTOMY     HAND SURGERY     SHOULDER SURGERY     Family History:  Family History  Problem Relation Age of Onset   Hypertension Mother    Hyperlipidemia Mother    Suicidality Father    Depression Father    Family Psychiatric  History:  Father- Bipolar Disorder, Completed Suicide    Social History:  Social History   Substance and Sexual Activity  Alcohol Use No     Social History   Substance and Sexual Activity  Drug Use No    Social History   Socioeconomic History   Marital status: Married    Spouse name: Not on file   Number of children: Not on file   Years of education: Not on file   Highest education level: Not on file  Occupational History   Not on file  Tobacco Use   Smoking status: Every Day    Current packs/day: 0.50    Average packs/day: 0.5 packs/day for 5.7 years (2.9 ttl pk-yrs)    Types: Cigarettes    Start date: 11/2018    Last attempt to quit: 09/10/2016   Smokeless tobacco: Never  Vaping Use   Vaping status: Never Used  Substance and Sexual Activity   Alcohol use: No   Drug use: No   Sexual activity: Not on file  Other Topics Concern   Not on file  Social History Narrative   Not on file   Social Drivers of Health   Financial Resource Strain: Not on file   Food Insecurity: Food Insecurity Present (07/04/2024)   Hunger Vital Sign    Worried About Running Out of Food in the Last Year: Sometimes true    Ran Out of Food in the Last Year: Never true  Transportation Needs: No Transportation Needs (07/04/2024)   PRAPARE - Administrator, Civil Service (Medical): No    Lack of Transportation (Non-Medical): No  Physical Activity: Not on file  Stress: Stress Concern Present (11/16/2023)   Received from [facility] - Occupational Stress Questionnaire    Feeling of Stress : Very much  Social Connections: Not on file   Additional Social History:                         Sleep: Fair Estimated Sleeping Duration (Last 24 Hours): 6.75-7.50 hours (Due to Daylight Saving Time, the durations displayed may not accurately represent documentation during the time change interval)  Appetite:  Good  Current Medications: Current Facility-Administered Medications  Medication Dose Route Frequency Provider Last Rate Last Admin   acetaminophen  (TYLENOL ) tablet 650 mg  650 mg Oral Q6H PRN McLauchlin, Angela, NP   650 mg at 07/23/24 1947   alum & mag hydroxide-simeth (MAALOX/MYLANTA) 200-200-20 MG/5ML suspension 30 mL  30 mL Oral Q4H PRN McLauchlin, Angela, NP       clomiPRAMINE  (ANAFRANIL ) capsule 200 mg  200 mg Oral QHS Butler, Laura N, MD   200 mg at 07/24/24 2059   clonazePAM  (KLONOPIN ) tablet 0.5 mg  0.5 mg Oral Daily PRN Towana Leita SAILOR, MD   0.5 mg at 07/24/24 2058   haloperidol  (HALDOL ) tablet 4 mg  4 mg Oral QHS Butler, Laura N, MD   4 mg at 07/24/24 2057   haloperidol  (HALDOL ) tablet 5 mg  5 mg Oral Q6H PRN Towana Leita SAILOR, MD       Or   haloperidol  lactate (HALDOL ) injection 5 mg  5 mg Intramuscular Q6H PRN Towana Leita SAILOR, MD   5 mg at 07/06/24 1508   hydrOXYzine  (ATARAX ) tablet 25 mg  25 mg Oral TID PRN Damondre Pfeifle S, DO   25 mg at 07/19/24 9862   lamoTRIgine  (LAMICTAL ) tablet  250 mg  250 mg Oral Daily Towana Leita SAILOR, MD   250 mg at 07/25/24 9176   magnesium  hydroxide (MILK OF MAGNESIA) suspension 30 mL  30 mL Oral Daily PRN McLauchlin, Angela, NP       melatonin tablet 3 mg  3 mg Oral QHS Towana Leita SAILOR, MD   3 mg at 07/24/24 2058   memantine  (NAMENDA ) tablet 10 mg  10 mg Oral BID Butler, Laura N, MD   10 mg at 07/25/24 0825   metFORMIN  (GLUCOPHAGE ) tablet  500 mg  500 mg Oral Q breakfast Towana Leita SAILOR, MD   500 mg at 07/25/24 0823   nystatin (MYCOSTATIN/NYSTOP) topical powder   Topical TID PRN Towana Leita SAILOR, MD   Given at 07/21/24 0820   nystatin cream (MYCOSTATIN)   Topical BID Towana Leita SAILOR, MD   Given at 07/24/24 1705   propranolol  ER (INDERAL  LA) 24 hr capsule 60 mg  60 mg Oral Daily Butler, Laura N, MD   60 mg at 07/25/24 9176   QUEtiapine  (SEROQUEL  XR) 24 hr tablet 300 mg  300 mg Oral QHS Butler, Laura N, MD   300 mg at 07/24/24 2058   traZODone  (DESYREL ) tablet 50 mg  50 mg Oral QHS Butler, Laura N, MD   50 mg at 07/24/24 2058    Lab Results: No results found for this or any previous visit (from the past 48 hours).  Blood Alcohol level:  Lab Results  Component Value Date   [facility] <15 07/04/2024   ETH <10 08/10/2022    Metabolic Disorder Labs: Lab Results  Component Value Date   HGBA1C 5.4 07/04/2024   MPG 108.28 07/04/2024   MPG 119.76 12/07/2023   No results found for: PROLACTIN Lab Results  Component Value Date   CHOL 223 (H) 07/04/2024   TRIG 100 07/04/2024   HDL 53 07/04/2024   CHOLHDL 4.2 07/04/2024   VLDL 20 07/04/2024   LDLCALC 150 (H) 07/04/2024   LDLCALC 92 12/07/2023    Physical Findings: AIMS:  ,  ,  ,  ,  ,  ,   CIWA:    COWS:     Musculoskeletal: Strength & Muscle Tone: within normal limits Gait & Station: normal Patient leans: N/A  Psychiatric Specialty Exam:  Presentation  General Appearance:  Appropriate for Environment; Casual  Eye Contact: Good  Speech: Clear and Coherent; Normal Rate  Speech  Volume: Normal  Handedness: Right   Mood and Affect  Mood: -- (ok)  Affect: Appropriate; Congruent   Thought Process  Thought Processes: Coherent; Goal Directed  Descriptions of Associations:Intact  Orientation:Full (Time, Place and Person)  Thought Content:Logical; WDL  History of Schizophrenia/Schizoaffective disorder:No  Duration of Psychotic Symptoms:N/A  Hallucinations:Hallucinations: None  Ideas of Reference:None  Suicidal Thoughts:Suicidal Thoughts: No  Homicidal Thoughts:Homicidal Thoughts: No   Sensorium  Memory: Immediate Fair; Recent Fair  Judgment: Fair  Insight: Fair   Art Therapist  Concentration: Fair  Attention Span: Fair  Recall: [employer] of Knowledge: Fair  Language: Fair   Psychomotor Activity  Psychomotor Activity:Psychomotor Activity: Normal   Assets  Assets: Manufacturing Systems Engineer; Resilience   Sleep  Sleep:Sleep: Fair    Physical Exam: Physical Exam Vitals and nursing note reviewed.  Constitutional:      General: She is not in acute distress.    Appearance: Normal appearance. She is obese. She is not ill-appearing or toxic-appearing.  HENT:     Head: Normocephalic and atraumatic.  Pulmonary:     Effort: Pulmonary effort is normal.  Musculoskeletal:        General: Normal range of motion.  Neurological:     General: No focal deficit present.     Mental Status: She is alert.    Review of Systems  Respiratory:  Negative for cough and shortness of breath.   Cardiovascular:  Negative for chest pain.  Gastrointestinal:  Negative for abdominal pain, constipation, diarrhea, nausea and vomiting.  Neurological:  Negative for dizziness, weakness and headaches.  Psychiatric/Behavioral:  Negative for depression,  hallucinations and suicidal ideas. The patient is not nervous/anxious.    Blood pressure 124/81, pulse 82, temperature 98.5 F (36.9 C), temperature source Oral, resp. rate 16, height 5'  6 (1.676 m), weight 108.3 kg, SpO2 97%. Body mass index is 38.54 kg/m.   Treatment Plan Summary: Daily contact with patient to assess and evaluate symptoms and progress in treatment and Medication management  Kara Stephens is a 38 yr old female who presented on 10/28 to [facility] due to intrusive Suicidal Thoughts and Homicidal Thoughts, she was admitted to [facility] on 10/29.  PPHx is significant for Bipolar Disorder, OCD, and Cluster B traits, and 4 Prior Psychiatric Hospitalizations (last [facility] 12/2023), and no history of Suicide Attempts.    Kara Stephens has responded well to her medications and is reporting improvement in mood.  She is only reporting occasional fleeting intrusive thoughts that she can push away quickly.  We will not make any changes to her medications att his time.  If she continues to do well we will continue with the plan for discharge tomorrow.  We will continue to monitor.    Bipolar I disorder, current episode mixed, (r/ out psychotic features)  OCD : -Continue Clomipramine  200 mg for OCD -Continue Lamictal  250 mg daily for mood stability -Continue Haldol  4 mg for psychosis and mood stability -Continue Seroquel  XR 300 mg QHS for mood stability and sleep -Continue Namenda  10 mg BID for reported intrusive thoughts (patient takes at home and reports benefit)  -Continue Trazodone  50 mg QHS -Continue klonopin  to 0.5 daily PRN -Continue Agitation Protocol: Haldol /Ativan /Benadryl    -Continue Metformin  500 mg daily -Continue Propanolol ER 60 mg daily -Continue Melatonin 3 mg QHS -Continue PRN's: Tylenol , Maalox, Milk of Magnesia   --  The risks/benefits/side-effects/alternatives to medications were discussed in detail with the patient and time was given for questions. The patient consents to medication trials.                -- Metabolic profile and EKG monitoring obtained while on an atypical antipsychotic (BMI: 37.96  Lipid Panel: WNL except Chol: 223, LDL: 150 HbgA1c: 5.4   EKG: Sinus Rhythm w/ PVC w/QTc: 450)              -- Encouraged patient to participate in unit milieu and in scheduled group therapies              -- Short Term Goals: Ability to identify changes in lifestyle to reduce recurrence of condition will improve, Ability to verbalize feelings will improve, Ability to disclose and discuss suicidal ideas, Ability to demonstrate self-control will improve, Ability to identify and develop effective coping behaviors will improve, Ability to maintain clinical measurements within normal limits will improve, Compliance with prescribed medications will improve, and Ability to identify triggers associated with substance abuse/mental health issues will improve             -- Long Term Goals: Improvement in symptoms so as ready for discharge   Safety and Monitoring:             -- Voluntary admission to inpatient psychiatric unit for safety, stabilization and treatment             -- Daily contact with patient to assess and evaluate symptoms and progress in treatment             -- Patient's case to be discussed in multi-disciplinary team meeting             --  Observation Level : q15 minute checks             -- Vital signs:  q12 hours             -- Precautions: suicide, elopement, and assault  Discharge Planning:              -- Social work and case management to assist with discharge planning and identification of hospital follow-up needs prior to discharge             -- Estimated LOS: 1-2 more days             -- Discharge Concerns: Need to establish a safety plan; Medication compliance and effectiveness             -- Discharge Goals: Return home with outpatient referrals for mental health follow-up including medication management/psychotherapy    Marsa GORMAN Rosser, DO 07/25/2024, 3:00 PM

## 2024-07-25 NOTE — Progress Notes (Signed)
(  Sleep Hours) - 7.75 (Any PRNs that were needed, meds refused, or side effects to meds)- PRN klonopin  0.5 mg given at pt request, no meds refused.  (Any disturbances and when (visitation, over night)- None  (Concerns raised by the patient)- None  (SI/HI/AVH)- Denies SI/HI/AVH

## 2024-07-25 NOTE — [facility] Group Notes (Signed)
 [facility] Group Notes:  (Nursing/MHT/Case Management/Adjunct)  Date:  07/25/2024  Time:  8:47 PM  Type of Therapy:  Wrap-up group  Participation Level:  Active  Participation Quality:  Appropriate  Affect:  Appropriate  Cognitive:  Appropriate  Insight:  Appropriate  Engagement in Group:  Engaged  Modes of Intervention:  Education  Summary of Progress/Problems: to have a good day. Rated day 8/10.  Kara Stephens 07/25/2024, 8:47 PM

## 2024-07-25 NOTE — Group Note (Signed)
 Date:  07/25/2024 Time:  5:01 PM  Group Topic/Focus:  Self Care:   The focus of this group is to help patients understand sleep hygiene and the importance of self-care in order to improve or restore emotional, physical, spiritual, interpersonal, and financial health.    Participation Level:  Minimal  Participation Quality:  Attentive  Affect:  Appropriate  Cognitive:  Alert and Oriented  Insight: Good  Engagement in Group:  Engaged  Modes of Intervention:  Discussion   Kara Stephens 07/25/2024, 5:01 PM

## 2024-07-26 DIAGNOSIS — F429 Obsessive-compulsive disorder, unspecified: Secondary | ICD-10-CM | POA: Diagnosis not present

## 2024-07-26 DIAGNOSIS — F3162 Bipolar disorder, current episode mixed, moderate: Secondary | ICD-10-CM | POA: Diagnosis not present

## 2024-07-26 MED ORDER — MELATONIN 3 MG PO TABS: 3.0000 mg | ORAL_TABLET | Freq: Every day | ORAL | Status: AC

## 2024-07-26 MED ORDER — HYDROXYZINE HCL 25 MG PO TABS: 25.0000 mg | ORAL_TABLET | Freq: Three times a day (TID) | ORAL | 0 refills | Status: AC | PRN

## 2024-07-26 MED ORDER — MEMANTINE HCL 10 MG PO TABS: 10.0000 mg | ORAL_TABLET | Freq: Two times a day (BID) | ORAL | 0 refills | Status: AC

## 2024-07-26 MED ORDER — QUETIAPINE FUMARATE ER 300 MG PO TB24
300.0000 mg | ORAL_TABLET | Freq: Every day | ORAL | 0 refills | Status: AC
Start: 2024-07-26 — End: ?

## 2024-07-26 MED ORDER — CLOMIPRAMINE HCL 25 MG PO CAPS
200.0000 mg | ORAL_CAPSULE | Freq: Every day | ORAL | 0 refills | Status: AC
Start: 2024-07-26 — End: ?

## 2024-07-26 MED ORDER — METFORMIN HCL 500 MG PO TABS: 500.0000 mg | ORAL_TABLET | Freq: Every day | ORAL | 0 refills | Status: AC

## 2024-07-26 MED ORDER — LAMOTRIGINE 100 MG PO TABS: 250.0000 mg | ORAL_TABLET | Freq: Every day | ORAL | 0 refills | Status: AC

## 2024-07-26 MED ORDER — PROPRANOLOL HCL ER 60 MG PO CP24
60.0000 mg | ORAL_CAPSULE | Freq: Every day | ORAL | 0 refills | Status: AC
Start: 2024-07-27 — End: ?

## 2024-07-26 MED ORDER — TRAZODONE HCL 50 MG PO TABS: 50.0000 mg | ORAL_TABLET | Freq: Every day | ORAL | 0 refills | Status: AC

## 2024-07-26 MED ORDER — HALOPERIDOL 2 MG PO TABS: 4.0000 mg | ORAL_TABLET | Freq: Every day | ORAL | 0 refills | Status: AC

## 2024-07-26 NOTE — Progress Notes (Signed)
 Patient was given a list of food banks in [place] and [place] counties.   Aylen Stradford, LCSWA 07/26/2024

## 2024-07-26 NOTE — Plan of Care (Signed)
   Problem: Education: Goal: Knowledge of [facility] Education information/materials will improve Outcome: Progressing Goal: Emotional status will improve Outcome: Progressing Goal: Mental status will improve Outcome: Progressing Goal: Verbalization of understanding the information provided will improve Outcome: Progressing   Problem: Activity: Goal: Interest or engagement in activities will improve Outcome: Progressing

## 2024-07-26 NOTE — Plan of Care (Signed)
   Problem: Education: Goal: Emotional status will improve Outcome: Progressing Goal: Mental status will improve Outcome: Progressing Goal: Verbalization of understanding the information provided will improve Outcome: Progressing   Problem: Activity: Goal: Interest or engagement in activities will improve Outcome: Progressing

## 2024-07-26 NOTE — Group Note (Signed)
 Date:  07/26/2024 Time:  10:13 AM  Group Topic/Focus:  Dimensions of Wellness:   The focus of this group is to introduce the topic of wellness and discuss the role each dimension of wellness plays in total health. Goals Group:   The focus of this group is to help patients establish daily goals to achieve during treatment and discuss how the patient can incorporate goal setting into their daily lives to aide in recovery. Identifying Needs:   The focus of this group is to help patients identify their personal needs that have been historically problematic and identify healthy behaviors to address their needs.    Participation Level:  Active  Participation Quality:  Appropriate  Affect:  Appropriate  Cognitive:  Appropriate  Insight: Appropriate  Engagement in Group:  Engaged  Modes of Intervention:  Discussion  Additional Comments:  Kara Stephens was able to share that her goal is to have a great day and stay positive, also see my family. Patient identified her mom,husband, daughter, sister, and mother in law as support system. Patient remained for the entirety of group.   Kara Stephens 07/26/2024, 10:13 AM

## 2024-07-26 NOTE — [facility] Suicide Risk Assessment (Addendum)
 [facility] Discharge Suicide Risk Assessment   Principal Problem: Bipolar 1 disorder, mixed, moderate (HCC) Discharge Diagnoses: Principal Problem:   Bipolar 1 disorder, mixed, moderate (HCC) Active Problems:   Obsessive compulsive disorder  During the patient's hospitalization, patient had extensive initial psychiatric evaluation, and follow-up psychiatric evaluations every day.  Psychiatric diagnoses provided upon initial assessment:    Bipolar 1 disorder, mixed, moderate (HCC) Active Problems:   Obsessive compulsive disorder  Patient's psychiatric medications were adjusted on admission: She was continued on her home doses of Lamictal , Namenda , and Trazodone .  Her Seroquel  was increased and she was started on an additional IR dose.  During the hospitalization, other adjustments were made to the patient's psychiatric medication regimen: Her Seroquel  was further adjusted to better control her anxiety.  Her Lamictal  was increased to provide for mood stability.  She was started on Clomipramine  and this was titrated during the hospitalization. She was started on Haldol .  Her Trazodone  was decreased.  Gradually, patient started adjusting to milieu.   Patient's care was discussed during the interdisciplinary team meeting every day during the hospitalization.  The patient is not having side effects to prescribed psychiatric medication.  The patient reports their target psychiatric symptoms of intrusive thoughts, depression, and anxiety responded well to the psychiatric medications, and the patient reports overall benefit other psychiatric hospitalization. Supportive psychotherapy was provided to the patient. The patient also participated in regular group therapy while admitted.   Labs were reviewed with the patient, and abnormal results were discussed with the patient.  The patient denied having suicidal thoughts more than 48 hours prior to discharge.  Patient denies having homicidal thoughts.   Patient denies having auditory hallucinations.  Patient denies any visual hallucinations.  Patient denies having paranoid thoughts.  The patient is able to verbalize their individual safety plan to this provider.  It is recommended to the patient to continue psychiatric medications as prescribed, after discharge from the hospital.    It is recommended to the patient to follow up with your outpatient psychiatric provider and PCP.  Discussed with the patient, the impact of alcohol, drugs, tobacco have been there overall psychiatric and medical wellbeing, and total abstinence from substance use was recommended the patient.  Total Time spent with patient: 20 minutes  Musculoskeletal: Strength & Muscle Tone: within normal limits Gait & Station: normal Patient leans: N/A  Psychiatric Specialty Exam  Presentation  General Appearance:  Appropriate for Environment; Casual  Eye Contact: Good  Speech: Clear and Coherent; Normal Rate  Speech Volume: Normal  Handedness: Right   Mood and Affect  Mood: -- (excited)  Duration of Depression Symptoms: Greater than two weeks  Affect: Appropriate; Congruent   Thought Process  Thought Processes: Coherent; Goal Directed  Descriptions of Associations:Intact  Orientation:Full (Time, Place and Person)  Thought Content:Logical; WDL  History of Schizophrenia/Schizoaffective disorder:No  Duration of Psychotic Symptoms:N/A  Hallucinations:Hallucinations: None  Ideas of Reference:None  Suicidal Thoughts:Suicidal Thoughts: No  Homicidal Thoughts:Homicidal Thoughts: No   Sensorium  Memory: Immediate Fair; Recent Fair  Judgment: Good  Insight: Good   Executive Functions  Concentration: Good  Attention Span: Good  Recall: Good  Fund of Knowledge: Good  Language: Good   Psychomotor Activity  Psychomotor Activity: Psychomotor Activity: Normal   Assets  Assets: Communication Skills; Resilience;  Desire for Improvement   Sleep  Sleep: Sleep: Good  Estimated Sleeping Duration (Last 24 Hours): 5.75-7.25 hours (Due to Daylight Saving Time, the durations displayed may not accurately represent documentation during the  time change interval)  Physical Exam: Physical Exam Vitals and nursing note reviewed.  Constitutional:      General: She is not in acute distress.    Appearance: Normal appearance. She is obese. She is not ill-appearing or toxic-appearing.  HENT:     Head: Normocephalic and atraumatic.  Pulmonary:     Effort: Pulmonary effort is normal.  Musculoskeletal:        General: Normal range of motion.  Neurological:     General: No focal deficit present.     Mental Status: She is alert.    Review of Systems  Respiratory:  Negative for cough and shortness of breath.   Cardiovascular:  Negative for chest pain.  Gastrointestinal:  Negative for abdominal pain, constipation, diarrhea, nausea and vomiting.  Neurological:  Negative for dizziness, weakness and headaches.  Psychiatric/Behavioral:  Negative for depression, hallucinations and suicidal ideas. The patient is not nervous/anxious.    Blood pressure 124/87, pulse 80, temperature 97.9 F (36.6 C), temperature source Oral, resp. rate 18, height 5' 6 (1.676 m), weight 108.3 kg, SpO2 97%. Body mass index is 38.54 kg/m.  Mental Status Per Nursing Assessment::   On Admission:  Suicidal ideation indicated by patient, Self-harm thoughts  Demographic Factors:  Caucasian  Loss Factors: NA  Historical Factors: NA  Risk Reduction Factors:   Responsible for children under 49 years of age, Living with another person, especially a relative, Positive social support, Positive therapeutic relationship, and Positive coping skills or problem solving skills  Continued Clinical Symptoms:  Obsessive-Compulsive Disorder More than one psychiatric diagnosis Previous Psychiatric Diagnoses and Treatments Medical Diagnoses and  Treatments/Surgeries  Cognitive Features That Contribute To Risk:  None    Suicide Risk:  Minimal: No identifiable suicidal ideation.  Patients presenting with no risk factors but with morbid ruminations; may be classified as minimal risk based on the severity of the depressive symptoms   Follow-up Information     [facility] Follow up on 07/31/2024.   Why: You have an appointment on 07/31/24 at 11:00 am for medication management services, Virtual, telehealth.  * Please call to cancel if you do not wish to attend. Contact information: [address] [place], [place] , [address]  Phone [phone]        [facility] Follow up on 07/13/2024.   Why: Please call your provider [facility], to confirm your therapy appointment on 07/13/24. Contact information: [facility] [place]  [address] [phone]        [facility] Follow up.   Why: A referral was sent on 07/10/2024. Please contact the provider on 07/27/2024 at 9 AM for an update on the status of the referral. Contact information: [facility]   Phone:  [phone]        [facility] Follow up on 08/09/2024.   Why: You have an appointment for medication management services on 08/09/24 at 2:10 pm.  This will be a Virtual appointment but you may call to switch to in person. Contact information: [address] [place] [address] [phone]         [facility] Follow up.   Why: If you would like, you can walk-in for ACTT assessment, Monday - Friday between 8:00 AM and 1:30 PM.  Please bring your discharge documents. Contact information: [employer] ([facility]) [address] [place], [place] [address]  Plan Of Care/Follow-up recommendations:  Activity: as tolerated  Diet: heart healthy  Other: -Follow-up  with your outpatient psychiatric provider -instructions on appointment date, time, and address (location) are provided to you in discharge paperwork.  -Take your psychiatric medications as prescribed at discharge - instructions are provided to you in the discharge paperwork  -Follow-up with outpatient primary care doctor and other specialists -for management of chronic medical disease, including: Routine Care.  Elevated lipid panel.  -Testing: Follow-up with outpatient provider for abnormal lab results: Elevated lipid panel.  -Recommend abstinence from alcohol, tobacco, and other illicit drug use at discharge.   -If your psychiatric symptoms recur, worsen, or if you have side effects to your psychiatric medications, call your outpatient psychiatric provider, 911, [phone] or go to the nearest emergency department.  -If suicidal thoughts recur, call your outpatient psychiatric provider, 911, [phone] or go to the nearest emergency department.   Marsa GORMAN Rosser, DO 07/26/2024, 8:21 AM

## 2024-07-26 NOTE — Progress Notes (Signed)
(  Sleep Hours) - 7.75 (Any PRNs that were needed, meds refused, or side effects to meds)- PRN klonopin  0.5 mg given at pt request, no meds refused.  (Any disturbances and when (visitation, over night)- None  (Concerns raised by the patient)- None  (SI/HI/AVH)- Denies SI/HI/AVH

## 2024-07-26 NOTE — Progress Notes (Signed)
 Patient discharged from [facility] on 07/26/24 at 1015. Patient denies SI, plan, and intention. Suicide safety plan completed, reviewed with this RN, given to the patient, and a copy in the chart. Patient denies HI/AVH upon discharge. Patient is alert, oriented, and cooperative. RN provided patient with discharge paperwork and reviewed information with patient. Patient expressed that she understood all of the discharge instructions. Pt was satisfied with belongings returned to her from the locker and at bedside. Discharged patient to [facility] waiting room. Pt's mother awaiting patient in the [facility] waiting room.

## 2024-07-26 NOTE — Discharge Summary (Signed)
 Physician Discharge Summary Note  Patient:  Kara Stephens is an 38 y.o., female MRN:  [id number] DOB:  1986-05-19 Patient phone:  [phone] (home)  Patient address:   [address] [place] [address],  Total Time spent with patient: 20 minutes  Date of Admission:  07/04/2024 Date of Discharge: 07/26/2024  Reason for Admission:   Kara Stephens is a 38 yr old female who presented on 10/28 to [facility] due to intrusive Suicidal Thoughts and Homicidal Thoughts, she was admitted to [facility] on 10/29.  PPHx is significant for Bipolar Disorder, OCD, and Cluster B traits, and 4 Prior Psychiatric Hospitalizations (last [facility] 12/2023), and no history of Suicide Attempts.   Today the patient reports that she is not good. States that her mania is not well controlled. Over the past 1-2 weeks she has been noting reduced sleep (5 hrs a night) and rapidly cycling thoughts. She is also having a lot of intrusive thoughts about harming herself and others. States she will think about suicide or things like dropping the toddler. She does not actually want to act on these, but the intrusive thoughts are distressing and she is afraid of what she might do. Does note that symptoms correspond to moving in to house-sit mother-in-laws house and that she has had some increased stress and disruption in her sleep schedule. Noting that with a toddler it is difficult for her to get a good night's rest. Also reports that her 54 year old daughter was recently hospitalized out of concern for suicidal thoughts after taking too many allergy medications (daughter had reported it was due to [facility] and wanting get high). Overall she does not actually want to end her life or the lives of anyone around her. However numerous times throughout the interview requests to be put in an isolation room because she is having intrusive thoughts of killing her roommate. Discussed at length intrusive thoughts as being often anxiety or OCD based  and patient having no actual intent or desire to act on this. She was requesting some medication changes to help address these symptoms and to help her sleep.   Principal Problem: Bipolar 1 disorder, mixed, moderate (HCC) Discharge Diagnoses: Principal Problem:   Bipolar 1 disorder, mixed, moderate (HCC) Active Problems:   Obsessive compulsive disorder   Past Psychiatric History:  Bipolar Disorder, OCD, and Cluster B traits, and 4 Prior Psychiatric Hospitalizations (last [facility] 12/2023), and no history of Suicide Attempts.   Past Medical History:  Past Medical History:  Diagnosis Date   Anxiety    Back spasm    Bipolar I disorder (HCC)    Borderline personality disorder (HCC)    Class 3 obesity (HCC)    Cluster B personality disorder (HCC)    Hypercholesterolemia    Hypertension    Hypertriglyceridemia    Somatic symptom disorder    Tachycardia    Type 2 diabetes mellitus (HCC)    Vitamin D  insufficiency     Past Surgical History:  Procedure Laterality Date   APPENDECTOMY     CHOLECYSTECTOMY     HAND SURGERY     SHOULDER SURGERY     Family History:  Family History  Problem Relation Age of Onset   Hypertension Mother    Hyperlipidemia Mother    Suicidality Father    Depression Father    Family Psychiatric  History:  Father- Bipolar Disorder, Completed Suicide   Social History:  Social History   Substance and Sexual Activity  Alcohol Use No  Social History   Substance and Sexual Activity  Drug Use No    Social History   Socioeconomic History   Marital status: Married    Spouse name: Not on file   Number of children: Not on file   Years of education: Not on file   Highest education level: Not on file  Occupational History   Not on file  Tobacco Use   Smoking status: Every Day    Current packs/day: 0.50    Average packs/day: 0.5 packs/day for 5.7 years (2.9 ttl pk-yrs)    Types: Cigarettes    Start date: 11/2018    Last attempt to quit: 09/10/2016    Smokeless tobacco: Never  Vaping Use   Vaping status: Never Used  Substance and Sexual Activity   Alcohol use: No   Drug use: No   Sexual activity: Not on file  Other Topics Concern   Not on file  Social History Narrative   Not on file   Social Drivers of Health   Financial Resource Strain: Not on file  Food Insecurity: Food Insecurity Present (07/04/2024)   Hunger Vital Sign    Worried About Running Out of Food in the Last Year: Sometimes true    Ran Out of Food in the Last Year: Never true  Transportation Needs: No Transportation Needs (07/04/2024)   PRAPARE - Administrator, Civil Service (Medical): No    Lack of Transportation (Non-Medical): No  Physical Activity: Not on file  Stress: Stress Concern Present (11/16/2023)   Received from [facility] - Occupational Stress Questionnaire    Feeling of Stress : Very much  Social Connections: Not on file    Hospital Course:   During the patient's hospitalization, patient had extensive initial psychiatric evaluation, and follow-up psychiatric evaluations every day.  Psychiatric diagnoses provided upon initial assessment:  Bipolar 1 disorder, mixed, moderate (HCC) Active Problems:   Obsessive compulsive disorder  Patient's psychiatric medications were adjusted on admission: She was continued on her home doses of Lamictal , Namenda , and Trazodone . Her Seroquel  was increased and she was started on an additional IR dose.   During the hospitalization, other adjustments were made to the patient's psychiatric medication regimen: Her Seroquel  was further adjusted to better control her anxiety. Her Lamictal  was increased to provide for mood stability. She was started on Clomipramine  and this was titrated during the hospitalization. She was started on Haldol . Her Trazodone  was decreased.   Patient's care was discussed during the interdisciplinary team meeting every day during the  hospitalization.  The patient is not having side effects to prescribed psychiatric medication.  Gradually, patient started adjusting to milieu. The patient was evaluated each day by a clinical provider to ascertain response to treatment. Improvement was noted by the patient's report of decreasing symptoms, improved sleep and appetite, affect, medication tolerance, behavior, and participation in unit programming.  Patient was asked each day to complete a self inventory noting mood, mental status, pain, new symptoms, anxiety and concerns.   Symptoms were reported as significantly decreased or resolved completely by discharge.  The patient reports that their mood is stable.  The patient denied having suicidal thoughts for more than 48 hours prior to discharge.  Patient denies having homicidal thoughts.  Patient denies having auditory hallucinations.  Patient denies any visual hallucinations or other symptoms of psychosis.  The patient was motivated to continue taking medication with a goal of continued improvement in mental health.  The patient reports their target psychiatric symptoms of intrusive thoughts, depression, and anxiety responded well to the psychiatric medications, and the patient reports overall benefit other psychiatric hospitalization. Supportive psychotherapy was provided to the patient. The patient also participated in regular group therapy while hospitalized. Coping skills, problem solving as well as relaxation therapies were also part of the unit programming.  Labs were reviewed with the patient, and abnormal results were discussed with the patient.  The patient is able to verbalize their individual safety plan to this provider.  # It is recommended to the patient to continue psychiatric medications as prescribed, after discharge from the hospital.    # It is recommended to the patient to follow up with your outpatient psychiatric provider and PCP.  # It was discussed with the  patient, the impact of alcohol, drugs, tobacco have been there overall psychiatric and medical wellbeing, and total abstinence from substance use was recommended the patient.ed.  # Prescriptions provided or sent directly to preferred pharmacy at discharge. Patient agreeable to plan. Given opportunity to ask questions. Appears to feel comfortable with discharge.    # In the event of worsening symptoms, the patient is instructed to call the crisis hotline, 911 and or go to the nearest ED for appropriate evaluation and treatment of symptoms. To follow-up with primary care provider for other medical issues, concerns and or health care needs  # Patient was discharged home with a plan to follow up as noted below.    On day of discharge she reports she is ready and excited to be going home.  She reports she has received amazing care and is thankful for the staff.  She reports rare intrusive thoughts that she is able to quickly disspell.  She reports no SI, HI, or AVH.  She reports no side effects to her medications.  She reports her sleep is good.  She reports her appetite is good.  Discussed with her the importance of taking her medications as prescribed and attending her follow up appointments and she reported understanding.  Discussed with her what to do in the event of a future crisis.  Discussed that she can go to [facility], go to the nearest ED, or call 911 or 988.   She reported understanding and had no concerns.  She was discharged home with her mother.   Physical Findings: AIMS: Facial and Oral Movements Muscles of Facial Expression: None Lips and Perioral Area: None Jaw: None Tongue: None,Extremity Movements Upper (arms, wrists, hands, fingers): None Lower (legs, knees, ankles, toes): None, Trunk Movements Neck, shoulders, hips: None, Global Judgements Severity of abnormal movements overall : None Incapacitation due to abnormal movements: None Patient's awareness of abnormal movements: No  Awareness,  ,  , AIMS Total Score AIMS Total Score: 0 CIWA:    COWS:     Musculoskeletal: Strength & Muscle Tone: within normal limits Gait & Station: normal Patient leans: N/A   Psychiatric Specialty Exam:  Presentation  General Appearance:  Appropriate for Environment; Casual  Eye Contact: Good  Speech: Clear and Coherent; Normal Rate  Speech Volume: Normal  Handedness: Right   Mood and Affect  Mood: -- (excited)  Affect: Appropriate; Congruent   Thought Process  Thought Processes: Coherent; Goal Directed  Descriptions of Associations:Intact  Orientation:Full (Time, Place and Person)  Thought Content:Logical; WDL  History of Schizophrenia/Schizoaffective disorder:No  Duration of Psychotic Symptoms:N/A  Hallucinations:Hallucinations: None  Ideas of Reference:None  Suicidal Thoughts:Suicidal Thoughts: No  Homicidal Thoughts:Homicidal Thoughts: No  Sensorium  Memory: Immediate Fair; Recent Fair  Judgment: Good  Insight: Good   Executive Functions  Concentration: Good  Attention Span: Good  Recall: Metta Abe of Knowledge: Good  Language: Good   Psychomotor Activity  Psychomotor Activity: Psychomotor Activity: Normal   Assets  Assets: Communication Skills; Resilience; Desire for Improvement   Sleep  Sleep: Sleep: Good  Estimated Sleeping Duration (Last 24 Hours): 5.75-7.25 hours (Due to Daylight Saving Time, the durations displayed may not accurately represent documentation during the time change interval)   Physical Exam: Physical Exam Vitals and nursing note reviewed.  Constitutional:      General: She is not in acute distress.    Appearance: Normal appearance. She is obese. She is not ill-appearing or toxic-appearing.  HENT:     Head: Normocephalic and atraumatic.  Pulmonary:     Effort: Pulmonary effort is normal.  Musculoskeletal:        General: Normal range of motion.  Neurological:      General: No focal deficit present.     Mental Status: She is alert.    Review of Systems  Respiratory:  Negative for cough and shortness of breath.   Cardiovascular:  Negative for chest pain.  Gastrointestinal:  Negative for abdominal pain, constipation, diarrhea, nausea and vomiting.  Neurological:  Negative for dizziness, weakness and headaches.  Psychiatric/Behavioral:  Negative for depression, hallucinations and suicidal ideas. The patient is not nervous/anxious.    Blood pressure 124/87, pulse 80, temperature 97.9 F (36.6 C), temperature source Oral, resp. rate 18, height 5' 6 (1.676 m), weight 108.3 kg, SpO2 97%. Body mass index is 38.54 kg/m.   Social History   Tobacco Use  Smoking Status Every Day   Current packs/day: 0.50   Average packs/day: 0.5 packs/day for 5.7 years (2.9 ttl pk-yrs)   Types: Cigarettes   Start date: 11/2018   Last attempt to quit: 09/10/2016  Smokeless Tobacco Never   Tobacco Cessation:  A prescription for an FDA-approved tobacco cessation medication was offered at discharge and the patient refused   Blood Alcohol level:  Lab Results  Component Value Date   [facility] <15 07/04/2024   ETH <10 08/10/2022    Metabolic Disorder Labs:  Lab Results  Component Value Date   HGBA1C 5.4 07/04/2024   MPG 108.28 07/04/2024   MPG 119.76 12/07/2023   No results found for: PROLACTIN Lab Results  Component Value Date   CHOL 223 (H) 07/04/2024   TRIG 100 07/04/2024   HDL 53 07/04/2024   CHOLHDL 4.2 07/04/2024   VLDL 20 07/04/2024   LDLCALC 150 (H) 07/04/2024   LDLCALC 92 12/07/2023    See Psychiatric Specialty Exam and Suicide Risk Assessment completed by Attending Physician prior to discharge.  Discharge destination:  Home  Is patient on multiple antipsychotic therapies at discharge:  No   Has Patient had three or more failed trials of antipsychotic monotherapy by history:  No  Recommended Plan for Multiple Antipsychotic  Therapies: NA  Discharge Instructions     Diet - low sodium heart healthy   Complete by: As directed    Increase activity slowly   Complete by: As directed       Allergies as of 07/26/2024       Reactions   Effexor [venlafaxine] Other (See Comments)   Pt reports becoming aggressive while taking this medication.   Olanzapine  Other (See Comments)   Pt reports becoming Manic while taking this drug.   Sulfa Antibiotics Anaphylaxis, Swelling  Tramadol Other (See Comments)   Manic   Trilafon [perphenazine] Other (See Comments)   Pt reports becoming manic while taking this medication.   Cortisone Other (See Comments)   Pain   Abilify [aripiprazole] Other (See Comments)   Psychosis, muscle spasms, drooling, agitiation    Depakote  [valproic Acid] Other (See Comments)   Flu like symptoms, worsens mania   Hydrocodone Other (See Comments)   Mania   Latuda [lurasidone] Other (See Comments)   Psychosis, muscle spasms, agitation, drooling, incontinence   Sertraline Other (See Comments)   Mania   Clindamycin/lincomycin Itching, Rash, Other (See Comments)   Flushing        Medication List     STOP taking these medications    aspirin EC 81 MG tablet   metFORMIN  500 MG 24 hr tablet Commonly known as: GLUCOPHAGE -XR Replaced by: metFORMIN  500 MG tablet   QUEtiapine  200 MG tablet Commonly known as: SEROQUEL  Replaced by: QUEtiapine  300 MG 24 hr tablet       TAKE these medications      Indication  albuterol  108 (90 Base) MCG/ACT inhaler Commonly known as: VENTOLIN  HFA Inhale 2 puffs into the lungs every 4 (four) hours as needed for wheezing or shortness of breath (cough, shortness of breath or wheezing.).  Indication: Asthma   clomiPRAMINE  25 MG capsule Commonly known as: ANAFRANIL  Take 8 capsules (200 mg total) by mouth at bedtime.  Indication: Major Depressive Disorder, Obsessive Compulsive Disorder   clonazePAM  0.5 MG tablet Commonly known as: KLONOPIN  Take 0.5  mg by mouth daily as needed for anxiety.  Indication: Feeling Anxious   haloperidol  2 MG tablet Commonly known as: HALDOL  Take 2 tablets (4 mg total) by mouth at bedtime.  Indication: Manic Phase of Manic-Depression   hydrOXYzine  25 MG tablet Commonly known as: ATARAX  Take 1 tablet (25 mg total) by mouth 3 (three) times daily as needed for anxiety.  Indication: Feeling Anxious   lamoTRIgine  100 MG tablet Commonly known as: LAMICTAL  Take 2.5 tablets (250 mg total) by mouth daily. Start taking on: July 27, 2024 What changed:  medication strength how much to take  Indication: Manic-Depression   melatonin 3 MG Tabs tablet Take 1 tablet (3 mg total) by mouth at bedtime.  Indication: Trouble Sleeping   memantine  10 MG tablet Commonly known as: NAMENDA  Take 1 tablet (10 mg total) by mouth 2 (two) times daily. What changed: when to take this  Indication: OCD   metFORMIN  500 MG tablet Commonly known as: GLUCOPHAGE  Take 1 tablet (500 mg total) by mouth daily with breakfast. Start taking on: July 27, 2024 Replaces: metFORMIN  500 MG 24 hr tablet  Indication: Body Weight Gain due to Antipsychotic Medication Use   multivitamin with minerals Tabs tablet Take 1 tablet by mouth daily.  Indication: Vitamin Deficiency   propranolol  ER 60 MG 24 hr capsule Commonly known as: INDERAL  LA Take 1 capsule (60 mg total) by mouth daily. Start taking on: July 27, 2024  Indication: Feeling Anxious, High Blood Pressure   QUEtiapine  300 MG 24 hr tablet Commonly known as: SEROQUEL  XR Take 1 tablet (300 mg total) by mouth at bedtime. Replaces: QUEtiapine  200 MG tablet  Indication: Manic-Depression, Generalized Anxiety Disorder   traZODone  50 MG tablet Commonly known as: DESYREL  Take 1 tablet (50 mg total) by mouth at bedtime. What changed:  medication strength how much to take when to take this reasons to take this  Indication: Trouble Sleeping   VITAMIN D -3 PO Take 1  capsule by mouth daily.  Indication: Vitamin Deficiency        Follow-up Information     [facility] Follow up on 07/31/2024.   Why: You have an appointment on 07/31/24 at 11:00 am for medication management services, Virtual, telehealth.  * Please call to cancel if you do not wish to attend. Contact information: [address] [place], [place] , [address]  Phone [phone]        [facility] Follow up on 07/13/2024.   Why: Please call your provider [facility], to confirm your therapy appointment on 07/13/24. Contact information: [facility] [place]  [address] [phone]        [facility] Follow up.   Why: A referral was sent on 07/10/2024. Please contact the provider on 07/27/2024 at 9 AM for an update on the status of the referral. Contact information: [facility] ([facility]   Phone:  [phone]        [facility] Follow up on 08/09/2024.   Why: You have an appointment for medication management services on 08/09/24 at 2:10 pm.  This will be a Virtual appointment but you may call to switch to in person. Contact information: [address] [place] [address] [phone]         [facility] Follow up.   Why: If you would like, you can walk-in for ACTT assessment, Monday - Friday between 8:00 AM and 1:30 PM.  Please bring your discharge documents. Contact information: [facility] ([facility]) [address] [place], [place] [address]                Follow-up recommendations/Comments:   Activity: as tolerated   Diet: heart healthy   Other: -Follow-up with your outpatient psychiatric provider -instructions on appointment date, time, and address (location) are provided to you in discharge paperwork.   -Take your psychiatric medications as prescribed at discharge - instructions  are provided to you in the discharge paperwork   -Follow-up with outpatient primary care doctor and other specialists -for management of chronic medical disease, including: Routine Care.  Elevated lipid panel.   -Testing: Follow-up with outpatient provider for abnormal lab results: Elevated lipid panel.   -Recommend abstinence from alcohol, tobacco, and other illicit drug use at discharge.    -If your psychiatric symptoms recur, worsen, or if you have side effects to your psychiatric medications, call your outpatient psychiatric provider, 911, [phone] or go to the nearest emergency department.   -If suicidal thoughts recur, call your outpatient psychiatric provider, 911, 988 or go to the nearest emergency department.    Signed: Marsa GORMAN Rosser, DO 07/26/2024, 4:28 PM

## 2024-07-26 NOTE — Progress Notes (Addendum)
 Patient was given return to work letter.   Penina Reisner, LCSWA 07/26/2024

## 2024-07-26 NOTE — Progress Notes (Signed)
   07/26/24 0907  Psych Admission Type (Psych Patients Only)  Admission Status Voluntary  Psychosocial Assessment  Patient Complaints Anxiety (I am having some anxiety about leaving. I am excited.)  Eye Contact Fair  Facial Expression Flat  Affect Constricted  Speech Soft  Interaction Assertive  Motor Activity Slow  Appearance/Hygiene Unremarkable  Behavior Characteristics Cooperative;Calm  Mood Pleasant;Anxious  Thought Process  Coherency WDL  Content WDL  Delusions None reported or observed  Perception WDL  Hallucination None reported or observed  Judgment WDL  Confusion None  Danger to Self  Current suicidal ideation? Denies  Description of Suicide Plan N/A  Self-Injurious Behavior No self-injurious ideation or behavior indicators observed or expressed   Agreement Not to Harm Self Yes  Description of Agreement Pt verbally contracts for safety  Danger to Others  Danger to Others None reported or observed  Danger to Others Abnormal  Harmful Behavior to others No threats or harm toward other people

## 2024-07-26 NOTE — Progress Notes (Signed)
  [facility] Adult Case Management Discharge Plan :  Will you be returning to the same living situation after discharge:  No.   At discharge, do you have transportation home?: Yes,  patient's mom, Boby Goldberg, will pick her up at 9:30 AM  Do you have the ability to pay for your medications: Yes,  patient has insurance  Release of information consent forms completed and in the chart;  Patient's signature needed at discharge.  Patient to Follow up at:  Follow-up Information     [facility] Follow up on 07/31/2024.   Why: You have an appointment on 07/31/24 at 11:00 am for medication management services, Virtual, telehealth.  * Please call to cancel if you do not wish to attend. Contact information: [address] [place], [place] , [address]  Phone [phone]        [facility] Follow up on 07/13/2024.   Why: Please call your provider [facility], to confirm your therapy appointment on 07/13/24. Contact information: [facility] [place]  [address] [phone]        [facility] Follow up.   Why: A referral was sent on 07/10/2024. Please contact the provider on 07/27/2024 at 9 AM for an update on the status of the referral. Contact information: [facility] ([facility]   Phone:  [phone]        [facility] Follow up on 08/09/2024.   Why: You have an appointment for medication management services on 08/09/24 at 2:10 pm.  This will be a Virtual appointment but you may call to switch to in person. Contact information: [address] [place] [address] [phone]         [facility] Follow up.   Why: If you would like, you can walk-in for ACTT assessment, Monday - Friday between 8:00 AM and 1:30 PM.  Please bring your discharge documents. Contact information: [facility] ([facility]) [address] [place], [place] [address]                Next level of care provider has access to [facility] Link:no  Safety Planning and Suicide Prevention discussed: Yes,  Vickie Sexton (mom) [phone] and Nakai Yard (husband) [phone]  Has patient been referred to the Quitline?: Patient refused referral for treatment.  Patient said that she smoked cigarettes for a short period in April (as part of a manic episode), but has never been addicted and doesn't need a referral at this time.  Patient has been referred for addiction treatment: No known substance use disorder.   Mossie Gilder O Lataunya Ruud, LCSWA 07/26/2024, 7:40 AM
